# Patient Record
Sex: Female | Born: 1935 | Race: White | Hispanic: No | Smoking: Never smoker
Health system: Southern US, Community
[De-identification: ages and names within clinical notes are randomized; demographics above are authoritative.]

## PROBLEM LIST (undated history)

## (undated) DIAGNOSIS — I499 Cardiac arrhythmia, unspecified: Secondary | ICD-10-CM

## (undated) DIAGNOSIS — C50919 Malignant neoplasm of unspecified site of unspecified female breast: Secondary | ICD-10-CM

## (undated) DIAGNOSIS — H9190 Unspecified hearing loss, unspecified ear: Secondary | ICD-10-CM

## (undated) DIAGNOSIS — I10 Essential (primary) hypertension: Secondary | ICD-10-CM

## (undated) DIAGNOSIS — F419 Anxiety disorder, unspecified: Secondary | ICD-10-CM

## (undated) DIAGNOSIS — I209 Angina pectoris, unspecified: Secondary | ICD-10-CM

## (undated) DIAGNOSIS — I519 Heart disease, unspecified: Secondary | ICD-10-CM

## (undated) DIAGNOSIS — C50219 Malignant neoplasm of upper-inner quadrant of unspecified female breast: Secondary | ICD-10-CM

## (undated) DIAGNOSIS — K219 Gastro-esophageal reflux disease without esophagitis: Secondary | ICD-10-CM

## (undated) DIAGNOSIS — M25559 Pain in unspecified hip: Secondary | ICD-10-CM

## (undated) DIAGNOSIS — IMO0001 Reserved for inherently not codable concepts without codable children: Secondary | ICD-10-CM

## (undated) DIAGNOSIS — I2510 Atherosclerotic heart disease of native coronary artery without angina pectoris: Secondary | ICD-10-CM

## (undated) DIAGNOSIS — I4891 Unspecified atrial fibrillation: Secondary | ICD-10-CM

## (undated) DIAGNOSIS — N63 Unspecified lump in unspecified breast: Secondary | ICD-10-CM

## (undated) DIAGNOSIS — E785 Hyperlipidemia, unspecified: Secondary | ICD-10-CM

## (undated) DIAGNOSIS — Z853 Personal history of malignant neoplasm of breast: Secondary | ICD-10-CM

## (undated) DIAGNOSIS — Z1239 Encounter for other screening for malignant neoplasm of breast: Secondary | ICD-10-CM

## (undated) DIAGNOSIS — Z1211 Encounter for screening for malignant neoplasm of colon: Secondary | ICD-10-CM

## (undated) DIAGNOSIS — I219 Acute myocardial infarction, unspecified: Secondary | ICD-10-CM

## (undated) DIAGNOSIS — T7840XA Allergy, unspecified, initial encounter: Secondary | ICD-10-CM

## (undated) DIAGNOSIS — K5790 Diverticulosis of intestine, part unspecified, without perforation or abscess without bleeding: Secondary | ICD-10-CM

## (undated) DIAGNOSIS — I639 Cerebral infarction, unspecified: Secondary | ICD-10-CM

## (undated) DIAGNOSIS — M1990 Unspecified osteoarthritis, unspecified site: Secondary | ICD-10-CM

## (undated) DIAGNOSIS — C50419 Malignant neoplasm of upper-outer quadrant of unspecified female breast: Secondary | ICD-10-CM

## (undated) DIAGNOSIS — D213 Benign neoplasm of connective and other soft tissue of thorax: Secondary | ICD-10-CM

## (undated) HISTORY — PX: HERNIA REPAIR: SHX51

## (undated) HISTORY — DX: Unspecified atrial fibrillation: I48.91

## (undated) HISTORY — DX: Reserved for inherently not codable concepts without codable children: IMO0001

## (undated) HISTORY — DX: Malignant neoplasm of upper-inner quadrant of unspecified female breast: C50.219

## (undated) HISTORY — PX: TONSILLECTOMY: SUR1361

## (undated) HISTORY — DX: Allergy, unspecified, initial encounter: T78.40XA

## (undated) HISTORY — DX: Benign neoplasm of connective and other soft tissue of thorax: D21.3

## (undated) HISTORY — DX: Acute myocardial infarction, unspecified: I21.9

## (undated) HISTORY — DX: Encounter for screening for malignant neoplasm of colon: Z12.11

## (undated) HISTORY — PX: ABDOMINAL HYSTERECTOMY: SHX81

## (undated) HISTORY — PX: CHOLECYSTECTOMY: SHX55

## (undated) HISTORY — PX: APPENDECTOMY: SHX54

## (undated) HISTORY — PX: BREAST BIOPSY: SHX20

## (undated) HISTORY — DX: Essential (primary) hypertension: I10

## (undated) HISTORY — DX: Unspecified hearing loss, unspecified ear: H91.90

## (undated) HISTORY — DX: Malignant neoplasm of unspecified site of unspecified female breast: C50.919

## (undated) HISTORY — DX: Unspecified lump in unspecified breast: N63.0

## (undated) HISTORY — PX: COLON SURGERY: SHX602

## (undated) HISTORY — DX: Unspecified osteoarthritis, unspecified site: M19.90

## (undated) HISTORY — DX: Malignant neoplasm of upper-outer quadrant of unspecified female breast: C50.419

## (undated) HISTORY — DX: Gastro-esophageal reflux disease without esophagitis: K21.9

## (undated) HISTORY — DX: Heart disease, unspecified: I51.9

## (undated) HISTORY — PX: EYE SURGERY: SHX253

## (undated) HISTORY — DX: Cerebral infarction, unspecified: I63.9

## (undated) HISTORY — PX: JOINT REPLACEMENT: SHX530

## (undated) HISTORY — DX: Personal history of malignant neoplasm of breast: Z85.3

## (undated) HISTORY — DX: Encounter for other screening for malignant neoplasm of breast: Z12.39

---

## 1994-12-22 DIAGNOSIS — I10 Essential (primary) hypertension: Secondary | ICD-10-CM

## 1994-12-22 HISTORY — DX: Essential (primary) hypertension: I10

## 1999-12-23 DIAGNOSIS — I219 Acute myocardial infarction, unspecified: Secondary | ICD-10-CM

## 1999-12-23 DIAGNOSIS — I519 Heart disease, unspecified: Secondary | ICD-10-CM

## 1999-12-23 HISTORY — DX: Heart disease, unspecified: I51.9

## 1999-12-23 HISTORY — DX: Acute myocardial infarction, unspecified: I21.9

## 2005-01-20 ENCOUNTER — Ambulatory Visit: Payer: Self-pay

## 2005-02-02 ENCOUNTER — Emergency Department: Payer: Self-pay | Admitting: Internal Medicine

## 2005-02-12 ENCOUNTER — Ambulatory Visit: Payer: Self-pay | Admitting: Internal Medicine

## 2005-03-31 ENCOUNTER — Ambulatory Visit: Payer: Self-pay

## 2005-06-14 ENCOUNTER — Other Ambulatory Visit: Payer: Self-pay

## 2005-06-14 ENCOUNTER — Emergency Department: Payer: Self-pay | Admitting: Emergency Medicine

## 2005-08-05 ENCOUNTER — Ambulatory Visit: Payer: Self-pay | Admitting: Anesthesiology

## 2005-08-14 ENCOUNTER — Ambulatory Visit: Payer: Self-pay | Admitting: Anesthesiology

## 2005-09-09 ENCOUNTER — Ambulatory Visit: Payer: Self-pay | Admitting: Anesthesiology

## 2005-10-13 ENCOUNTER — Ambulatory Visit: Payer: Self-pay | Admitting: Anesthesiology

## 2006-04-23 ENCOUNTER — Ambulatory Visit: Payer: Self-pay | Admitting: Internal Medicine

## 2006-08-04 ENCOUNTER — Emergency Department: Payer: Self-pay | Admitting: Emergency Medicine

## 2006-08-04 ENCOUNTER — Other Ambulatory Visit: Payer: Self-pay

## 2006-08-20 ENCOUNTER — Ambulatory Visit: Payer: Self-pay | Admitting: Anesthesiology

## 2006-09-08 ENCOUNTER — Ambulatory Visit: Payer: Self-pay | Admitting: Anesthesiology

## 2007-03-15 ENCOUNTER — Ambulatory Visit: Payer: Self-pay | Admitting: Internal Medicine

## 2007-05-13 ENCOUNTER — Ambulatory Visit: Payer: Self-pay | Admitting: Internal Medicine

## 2007-12-23 DIAGNOSIS — I639 Cerebral infarction, unspecified: Secondary | ICD-10-CM

## 2007-12-23 HISTORY — PX: COLONOSCOPY: SHX174

## 2007-12-23 HISTORY — DX: Cerebral infarction, unspecified: I63.9

## 2007-12-23 HISTORY — PX: UPPER GI ENDOSCOPY: SHX6162

## 2008-03-29 ENCOUNTER — Ambulatory Visit: Payer: Self-pay | Admitting: Cardiology

## 2008-05-17 ENCOUNTER — Ambulatory Visit: Payer: Self-pay | Admitting: Family Medicine

## 2008-05-31 ENCOUNTER — Ambulatory Visit: Payer: Self-pay | Admitting: Gastroenterology

## 2008-05-31 ENCOUNTER — Encounter: Payer: Self-pay | Admitting: Gastroenterology

## 2008-06-05 ENCOUNTER — Ambulatory Visit: Payer: Self-pay | Admitting: Gastroenterology

## 2008-10-01 ENCOUNTER — Other Ambulatory Visit: Payer: Self-pay

## 2008-10-01 ENCOUNTER — Inpatient Hospital Stay: Payer: Self-pay | Admitting: Internal Medicine

## 2008-10-30 ENCOUNTER — Emergency Department: Payer: Self-pay | Admitting: Emergency Medicine

## 2008-11-14 ENCOUNTER — Ambulatory Visit: Payer: Self-pay | Admitting: Family Medicine

## 2009-01-13 ENCOUNTER — Emergency Department: Payer: Self-pay | Admitting: Emergency Medicine

## 2009-01-14 ENCOUNTER — Emergency Department: Payer: Self-pay | Admitting: Emergency Medicine

## 2009-02-05 ENCOUNTER — Ambulatory Visit: Payer: Self-pay | Admitting: Gastroenterology

## 2009-02-14 ENCOUNTER — Ambulatory Visit: Payer: Self-pay | Admitting: Gastroenterology

## 2009-02-14 DIAGNOSIS — R1013 Epigastric pain: Secondary | ICD-10-CM

## 2009-02-14 DIAGNOSIS — R932 Abnormal findings on diagnostic imaging of liver and biliary tract: Secondary | ICD-10-CM | POA: Insufficient documentation

## 2009-02-14 LAB — CONVERTED CEMR LAB
Alkaline Phosphatase: 56 units/L (ref 39–117)
Basophils Absolute: 0 10*3/uL (ref 0.0–0.1)
Basophils Relative: 0.2 % (ref 0.0–3.0)
CO2: 30 meq/L (ref 19–32)
Calcium: 9.3 mg/dL (ref 8.4–10.5)
Chloride: 104 meq/L (ref 96–112)
Eosinophils Absolute: 0.1 10*3/uL (ref 0.0–0.7)
GFR calc non Af Amer: 74 mL/min
Glucose, Bld: 108 mg/dL — ABNORMAL HIGH (ref 70–99)
HCT: 39.9 % (ref 36.0–46.0)
Monocytes Absolute: 0.7 10*3/uL (ref 0.1–1.0)
Monocytes Relative: 8.7 % (ref 3.0–12.0)
Neutro Abs: 4.3 10*3/uL (ref 1.4–7.7)
Neutrophils Relative %: 49.5 % (ref 43.0–77.0)
Platelets: 271 10*3/uL (ref 150–400)
Potassium: 4.5 meq/L (ref 3.5–5.1)
RBC: 4.07 M/uL (ref 3.87–5.11)

## 2009-02-15 ENCOUNTER — Ambulatory Visit: Payer: Self-pay | Admitting: Cardiology

## 2009-03-19 ENCOUNTER — Observation Stay: Payer: Self-pay | Admitting: Internal Medicine

## 2009-08-10 ENCOUNTER — Ambulatory Visit: Payer: Self-pay | Admitting: Family Medicine

## 2009-10-16 ENCOUNTER — Encounter: Admission: RE | Admit: 2009-10-16 | Discharge: 2009-10-16 | Payer: Self-pay | Admitting: Neurology

## 2009-11-20 ENCOUNTER — Ambulatory Visit: Payer: Self-pay | Admitting: Family Medicine

## 2009-11-28 ENCOUNTER — Inpatient Hospital Stay: Payer: Self-pay | Admitting: Internal Medicine

## 2010-03-14 ENCOUNTER — Emergency Department: Payer: Self-pay | Admitting: Emergency Medicine

## 2010-07-18 ENCOUNTER — Emergency Department: Payer: Self-pay | Admitting: Emergency Medicine

## 2010-08-10 ENCOUNTER — Inpatient Hospital Stay: Payer: Self-pay | Admitting: Specialist

## 2010-08-23 ENCOUNTER — Ambulatory Visit: Payer: Self-pay | Admitting: Gastroenterology

## 2010-08-27 LAB — PATHOLOGY REPORT

## 2010-08-29 ENCOUNTER — Ambulatory Visit: Payer: Self-pay | Admitting: Gastroenterology

## 2010-12-22 DIAGNOSIS — H9190 Unspecified hearing loss, unspecified ear: Secondary | ICD-10-CM

## 2010-12-22 DIAGNOSIS — C50919 Malignant neoplasm of unspecified site of unspecified female breast: Secondary | ICD-10-CM

## 2010-12-22 HISTORY — PX: MASTECTOMY: SHX3

## 2010-12-22 HISTORY — DX: Unspecified hearing loss, unspecified ear: H91.90

## 2010-12-22 HISTORY — DX: Malignant neoplasm of unspecified site of unspecified female breast: C50.919

## 2011-01-31 ENCOUNTER — Ambulatory Visit: Payer: Self-pay | Admitting: Family Medicine

## 2011-02-10 ENCOUNTER — Ambulatory Visit: Payer: Self-pay | Admitting: Family Medicine

## 2011-03-04 ENCOUNTER — Ambulatory Visit: Payer: Self-pay | Admitting: Family Medicine

## 2011-03-09 DIAGNOSIS — D0512 Intraductal carcinoma in situ of left breast: Secondary | ICD-10-CM | POA: Insufficient documentation

## 2011-03-20 ENCOUNTER — Ambulatory Visit: Payer: Self-pay | Admitting: General Surgery

## 2011-03-31 ENCOUNTER — Ambulatory Visit: Payer: Self-pay | Admitting: General Surgery

## 2011-04-08 LAB — PATHOLOGY REPORT

## 2011-06-01 ENCOUNTER — Inpatient Hospital Stay: Payer: Self-pay | Admitting: Internal Medicine

## 2011-11-19 ENCOUNTER — Ambulatory Visit: Payer: Self-pay | Admitting: Family Medicine

## 2011-12-29 ENCOUNTER — Encounter: Payer: Self-pay | Admitting: Urology

## 2012-02-10 ENCOUNTER — Observation Stay: Payer: Self-pay | Admitting: Internal Medicine

## 2012-02-10 LAB — COMPREHENSIVE METABOLIC PANEL
Albumin: 3.7 g/dL (ref 3.4–5.0)
Alkaline Phosphatase: 51 U/L (ref 50–136)
Anion Gap: 8 (ref 7–16)
Calcium, Total: 9.3 mg/dL (ref 8.5–10.1)
Chloride: 102 mmol/L (ref 98–107)
EGFR (African American): >60
Glucose: 79 mg/dL (ref 65–99)
Potassium: 3.9 mmol/L (ref 3.5–5.1)
SGOT(AST): 26 U/L (ref 15–37)

## 2012-02-10 LAB — APTT: Activated PTT: 28.9 secs (ref 23.6–35.9)

## 2012-02-10 LAB — LIPASE, BLOOD: Lipase: 150 U/L (ref 73–393)

## 2012-02-10 LAB — CBC
HCT: 41.3 % (ref 35.0–47.0)
MCH: 32.1 pg (ref 26.0–34.0)
MCHC: 32.5 g/dL (ref 32.0–36.0)
MCV: 99 fL (ref 80–100)
Platelet: 264 10*3/uL (ref 150–440)
RDW: 11.7 % (ref 11.5–14.5)

## 2012-02-10 LAB — PROTIME-INR
INR: 0.9
Prothrombin Time: 12.2 secs (ref 11.5–14.7)

## 2012-02-10 LAB — MAGNESIUM: Magnesium: 2.1 mg/dL

## 2012-02-10 LAB — TROPONIN I: Troponin-I: <0.02 ng/mL

## 2012-02-10 LAB — CK TOTAL AND CKMB (NOT AT ARMC): CK-MB: <0.5 ng/mL — ABNORMAL LOW (ref 0.5–3.6)

## 2012-02-11 LAB — CBC WITH DIFFERENTIAL/PLATELET
Basophil %: 0.9 %
Eosinophil %: 3 %
HCT: 37.6 % (ref 35.0–47.0)
HGB: 12.9 g/dL (ref 12.0–16.0)
Lymphocyte #: 3.6 10*3/uL (ref 1.0–3.6)
MCHC: 34.2 g/dL (ref 32.0–36.0)
MCV: 99 fL (ref 80–100)
Monocyte #: 0.9 10*3/uL — ABNORMAL HIGH (ref 0.0–0.7)
Monocyte %: 10.9 %
Neutrophil #: 3.2 10*3/uL (ref 1.4–6.5)
Platelet: 237 10*3/uL (ref 150–440)
RBC: 3.79 10*6/uL — ABNORMAL LOW (ref 3.80–5.20)
WBC: 8 10*3/uL (ref 3.6–11.0)

## 2012-02-11 LAB — BASIC METABOLIC PANEL
Calcium, Total: 8.9 mg/dL (ref 8.5–10.1)
Co2: 26 mmol/L (ref 21–32)
Creatinine: 0.96 mg/dL (ref 0.60–1.30)
EGFR (African American): >60
EGFR (Non-African Amer.): 59 — ABNORMAL LOW
Glucose: 95 mg/dL (ref 65–99)
Osmolality: 283 (ref 275–301)
Potassium: 4 mmol/L (ref 3.5–5.1)
Sodium: 142 mmol/L (ref 136–145)

## 2012-02-11 LAB — CK TOTAL AND CKMB (NOT AT ARMC)
CK, Total: 48 U/L (ref 21–215)
CK, Total: 55 U/L (ref 21–215)
CK-MB: <0.5 ng/mL — ABNORMAL LOW (ref 0.5–3.6)
CK-MB: <0.5 ng/mL — ABNORMAL LOW (ref 0.5–3.6)

## 2012-02-11 LAB — TROPONIN I: Troponin-I: <0.02 ng/mL

## 2012-02-24 ENCOUNTER — Ambulatory Visit: Payer: Self-pay | Admitting: General Surgery

## 2012-03-01 ENCOUNTER — Ambulatory Visit: Payer: Self-pay | Admitting: Gastroenterology

## 2012-06-18 ENCOUNTER — Inpatient Hospital Stay: Payer: Self-pay | Admitting: Surgery

## 2012-06-18 LAB — CBC WITH DIFFERENTIAL/PLATELET
Basophil %: 0.7 %
Eosinophil %: 2.7 %
HGB: 12.8 g/dL (ref 12.0–16.0)
Lymphocyte #: 3.1 10*3/uL (ref 1.0–3.6)
Lymphocyte %: 35.8 %
MCHC: 32.7 g/dL (ref 32.0–36.0)
Monocyte #: 0.8 x10 3/mm (ref 0.2–0.9)
Monocyte %: 8.9 %
Neutrophil #: 4.5 10*3/uL (ref 1.4–6.5)
RDW: 12.4 % (ref 11.5–14.5)
WBC: 8.6 10*3/uL (ref 3.6–11.0)

## 2012-06-18 LAB — COMPREHENSIVE METABOLIC PANEL
BUN: 11 mg/dL (ref 7–18)
Co2: 23 mmol/L (ref 21–32)
EGFR (African American): >60
Osmolality: 285 (ref 275–301)

## 2012-06-22 LAB — PATHOLOGY REPORT

## 2012-10-01 ENCOUNTER — Inpatient Hospital Stay: Payer: Self-pay | Admitting: Internal Medicine

## 2012-10-01 LAB — CBC
MCHC: 34.5 g/dL (ref 32.0–36.0)
Platelet: 258 10*3/uL (ref 150–440)
RBC: 3.95 10*6/uL (ref 3.80–5.20)
RDW: 12.8 % (ref 11.5–14.5)
WBC: 8.7 10*3/uL (ref 3.6–11.0)

## 2012-10-01 LAB — COMPREHENSIVE METABOLIC PANEL
Alkaline Phosphatase: 98 U/L (ref 50–136)
BUN: 13 mg/dL (ref 7–18)
Bilirubin,Total: 1 mg/dL (ref 0.2–1.0)
Chloride: 109 mmol/L — ABNORMAL HIGH (ref 98–107)
Co2: 23 mmol/L (ref 21–32)
Creatinine: 0.58 mg/dL — ABNORMAL LOW (ref 0.60–1.30)
EGFR (African American): >60
Osmolality: 283 (ref 275–301)
Potassium: 4.3 mmol/L (ref 3.5–5.1)
SGPT (ALT): 48 U/L (ref 12–78)
Sodium: 142 mmol/L (ref 136–145)
Total Protein: 7.3 g/dL (ref 6.4–8.2)

## 2012-10-01 LAB — APTT: Activated PTT: 28.5 secs (ref 23.6–35.9)

## 2012-10-01 LAB — TROPONIN I: Troponin-I: 0.03 ng/mL

## 2012-10-01 LAB — TSH: Thyroid Stimulating Horm: 0.989 u[IU]/mL

## 2012-10-02 LAB — CBC WITH DIFFERENTIAL/PLATELET
Basophil %: 1.1 %
Eosinophil #: 0.3 10*3/uL (ref 0.0–0.7)
Eosinophil %: 3.3 %
HGB: 12.6 g/dL (ref 12.0–16.0)
Lymphocyte %: 53.7 %
MCH: 33.1 pg (ref 26.0–34.0)
MCHC: 34.4 g/dL (ref 32.0–36.0)
MCV: 97 fL (ref 80–100)
Monocyte #: 0.7 x10 3/mm (ref 0.2–0.9)
Monocyte %: 9.7 %
Neutrophil %: 32.2 %
Platelet: 256 10*3/uL (ref 150–440)
RBC: 3.8 10*6/uL (ref 3.80–5.20)

## 2012-10-02 LAB — BASIC METABOLIC PANEL
Anion Gap: 11 (ref 7–16)
BUN: 13 mg/dL (ref 7–18)
Creatinine: 0.83 mg/dL (ref 0.60–1.30)
EGFR (African American): >60
Glucose: 101 mg/dL — ABNORMAL HIGH (ref 65–99)
Potassium: 3.6 mmol/L (ref 3.5–5.1)
Sodium: 143 mmol/L (ref 136–145)

## 2012-10-02 LAB — CK TOTAL AND CKMB (NOT AT ARMC)
CK, Total: 60 U/L (ref 21–215)
CK-MB: 1 ng/mL (ref 0.5–3.6)

## 2012-10-02 LAB — LIPID PANEL
Ldl Cholesterol, Calc: 105 mg/dL — ABNORMAL HIGH (ref 0–100)
Triglycerides: 105 mg/dL (ref 0–200)

## 2012-10-03 LAB — APTT: Activated PTT: 120.1 secs — ABNORMAL HIGH (ref 23.6–35.9)

## 2012-10-03 LAB — PROTIME-INR: Prothrombin Time: 12.7 secs (ref 11.5–14.7)

## 2012-12-04 ENCOUNTER — Encounter: Payer: Self-pay | Admitting: *Deleted

## 2012-12-29 ENCOUNTER — Inpatient Hospital Stay: Payer: Self-pay | Admitting: Surgery

## 2012-12-29 LAB — COMPREHENSIVE METABOLIC PANEL
Albumin: 3.9 g/dL (ref 3.4–5.0)
Alkaline Phosphatase: 99 U/L (ref 50–136)
Bilirubin,Total: 0.5 mg/dL (ref 0.2–1.0)
Co2: 27 mmol/L (ref 21–32)
Creatinine: 0.95 mg/dL (ref 0.60–1.30)
EGFR (Non-African Amer.): 56 — ABNORMAL LOW
Glucose: 126 mg/dL — ABNORMAL HIGH (ref 65–99)
Osmolality: 291 (ref 275–301)
SGOT(AST): 22 U/L (ref 15–37)
Sodium: 144 mmol/L (ref 136–145)

## 2012-12-29 LAB — PROTIME-INR: Prothrombin Time: 17.5 secs — ABNORMAL HIGH (ref 11.5–14.7)

## 2012-12-29 LAB — CBC
MCH: 31.2 pg (ref 26.0–34.0)
MCHC: 32.2 g/dL (ref 32.0–36.0)
MCV: 97 fL (ref 80–100)
Platelet: 260 10*3/uL (ref 150–440)
RBC: 4.1 10*6/uL (ref 3.80–5.20)
RDW: 12.5 % (ref 11.5–14.5)

## 2012-12-30 LAB — CBC WITH DIFFERENTIAL/PLATELET
Basophil #: 0.1 10*3/uL (ref 0.0–0.1)
Basophil %: 0.5 %
Eosinophil #: 0 10*3/uL (ref 0.0–0.7)
Eosinophil %: 0.1 %
HCT: 43.3 % (ref 35.0–47.0)
Lymphocyte #: 2.1 10*3/uL (ref 1.0–3.6)
Lymphocyte %: 13.1 %
MCHC: 32.8 g/dL (ref 32.0–36.0)
MCV: 97 fL (ref 80–100)
Monocyte #: 1.4 x10 3/mm — ABNORMAL HIGH (ref 0.2–0.9)
Platelet: 251 10*3/uL (ref 150–440)
RBC: 4.45 10*6/uL (ref 3.80–5.20)
RDW: 12.7 % (ref 11.5–14.5)

## 2012-12-30 LAB — PROTIME-INR
INR: 1.8
Prothrombin Time: 21.5 secs — ABNORMAL HIGH (ref 11.5–14.7)

## 2012-12-30 LAB — BASIC METABOLIC PANEL
BUN: 15 mg/dL (ref 7–18)
Calcium, Total: 8.5 mg/dL (ref 8.5–10.1)
Chloride: 108 mmol/L — ABNORMAL HIGH (ref 98–107)
Co2: 25 mmol/L (ref 21–32)
EGFR (African American): >60
EGFR (Non-African Amer.): >60

## 2012-12-31 HISTORY — PX: SMALL INTESTINE SURGERY: SHX150

## 2012-12-31 LAB — PROTIME-INR
INR: 1.2
Prothrombin Time: 15.3 s — ABNORMAL HIGH

## 2013-01-02 LAB — CBC WITH DIFFERENTIAL/PLATELET
Basophil %: 0.6 %
HGB: 12.6 g/dL (ref 12.0–16.0)
Lymphocyte %: 22.9 %
MCH: 32.2 pg (ref 26.0–34.0)
MCV: 98 fL (ref 80–100)
Monocyte #: 1.4 x10 3/mm — ABNORMAL HIGH (ref 0.2–0.9)
Monocyte %: 10.3 %
RBC: 3.92 10*6/uL (ref 3.80–5.20)

## 2013-01-02 LAB — BASIC METABOLIC PANEL
Anion Gap: 7 (ref 7–16)
Calcium, Total: 8.3 mg/dL — ABNORMAL LOW (ref 8.5–10.1)
EGFR (African American): >60
Glucose: 111 mg/dL — ABNORMAL HIGH (ref 65–99)
Osmolality: 275 (ref 275–301)
Potassium: 4.3 mmol/L (ref 3.5–5.1)
Sodium: 138 mmol/L (ref 136–145)

## 2013-01-06 LAB — PROTIME-INR
INR: 0.9
Prothrombin Time: 12.7 secs (ref 11.5–14.7)

## 2013-01-06 LAB — PLATELET COUNT: Platelet: 329 10*3/uL (ref 150–440)

## 2013-01-28 ENCOUNTER — Encounter: Payer: Self-pay | Admitting: General Surgery

## 2013-02-24 ENCOUNTER — Ambulatory Visit: Payer: Self-pay | Admitting: General Surgery

## 2013-09-05 ENCOUNTER — Ambulatory Visit (INDEPENDENT_AMBULATORY_CARE_PROVIDER_SITE_OTHER): Payer: Medicare Other | Admitting: Neurology

## 2013-09-05 ENCOUNTER — Encounter: Payer: Self-pay | Admitting: Neurology

## 2013-09-05 DIAGNOSIS — H538 Other visual disturbances: Secondary | ICD-10-CM | POA: Insufficient documentation

## 2013-09-05 DIAGNOSIS — I6529 Occlusion and stenosis of unspecified carotid artery: Secondary | ICD-10-CM

## 2013-09-05 NOTE — Patient Instructions (Addendum)
Continue warfarin for stroke prevention given atrial fibrillation and strict control of hypertension with blood pressure goal below 130/90 and lipids with LDL cholesterol goal below 100 mg percent. Check followup carotid ultrasound study. Return for followup in 6 months with Darrol Angel, NO or. Call  earlier if necessary.

## 2013-09-05 NOTE — Progress Notes (Signed)
[facility] [address] [place]. [place] [address] [phone]       OFFICE FOLLOW-UP NOTE  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 Vascular risk factors of age, sex, hyperlipidemia, hypertension and atrial fibrillatio Update 09/05/2013  She returns for followup after her last visit on 03/02/13 with Darrol Angel, NP. She states she is doing well without recurrent stroke or TIA symptoms. She's had no further episodes of blurred vision either. She plans to see her ophthalmologist next month for routine checkup. She is tolerating warfarin well without significant bleeding, bruising or other side effects. She lives alone and is fully independent in activities of daily living. She has no complaints today. ROS:   14 system review of systems is positive for restless legs, feeling hot and cold, joint pain, allergies, blurred vision, leg swelling and hearing loss.  PMH:  Past Medical History  Diagnosis Date  . Essential hypertension, benign   . Arthritis   . Heart disease 2001  . Hypertension 1996  . Stroke 2009  . Heart attack 2001  . Unspecified essential hypertension   . Hearing disorder 2012    hearing aids  . Reflux     acid reflux  . Breast screening, unspecified   . Lump or mass in breast   . Special screening for malignant neoplasms, colon   . Other benign neoplasm of connective and other soft tissue of thorax   . Personal history of malignant neoplasm of breast   . Breast cancer 2012    Patient underwent a left mastectomy on 03-31-11. Pathology showed DCIS at both sites without an invasive component. Sentinel nodes were negative. The breast lesions were noted to be ER +40%, PR-positive, 5%.   . Malignant neoplasm of upper-inner quadrant of female breast   .  Malignant neoplasm of upper-outer quadrant of female breast     Social History:  History   Social History  . Marital Status: Widowed    Spouse Name: N/A    Number of Children: N/A  . Years of Education: N/A   Occupational History  . Not on file.   Social History Main Topics  . Smoking status: Never Smoker   . Smokeless tobacco: Not on file  . Alcohol Use: No  . Drug Use: No  . Sexual Activity: Not on file   Other Topics Concern  . Not on file   Social History Narrative  . No narrative on file    Medications:   Current Outpatient Prescriptions on File Prior to Visit  Medication Sig Dispense Refill  . acetaminophen (TYLENOL) 325 MG tablet Take 325 mg by mouth as needed.      . ALPRAZolam (XANAX) 0.25 MG tablet Take 0.25 mg by mouth daily.      Marland Kitchen aspirin 81 MG tablet Take 81 mg by mouth daily.      . ranitidine (ZANTAC) 150 MG tablet Take 150 mg by mouth as directed.       No current facility-administered medications on file prior to visit.    Allergies:   Allergies  Allergen Reactions  . Alendronate Sodium     REACTION: questionable  . Amoxicillin-Pot Clavulanate   . Etodolac   . Ibandronate Sodium     REACTION: questionable  . Lactose Intolerance (Gi) Other (See  Comments)    GI problems  . Minocycline Hcl   . Risedronate Sodium   . Sulfonamide Derivatives Other (See Comments)    Stomach pain  . Zoledronic Acid     REACTION: "dental problems"    Physical Exam General: well developed, well nourished, seated, in no evident distress Head: head normocephalic and atraumatic. Orohparynx benign Neck: supple with no carotid or supraclavicular bruits Cardiovascular: regular rate and rhythm, no murmurs Musculoskeletal: arhritic distal finger flexion deformity left hand Skin:  no rash/petichiae Vascular:  Normal pulses all extremities Filed Vitals:   09/05/13 1420  BP: 137/73  Pulse: 70  Temp: 98.5 F (36.9 C)    Neurologic Exam Mental Status: Awake and  fully alert. Oriented to place and time. Recent and remote memory mildy diminished. Attention span, concentration and fund of knowledge appropriate. Mood and affect appropriate.  Cranial Nerves: Fundoscopic exam not done   Pupils equal, briskly reactive to light. Extraocular movements full without nystagmus. Visual fields full to confrontation. Hearing mildly diminished bilaterally. Facial sensation intact. Face, tongue, palate moves normally and symmetrically.  Motor: Normal bulk and tone. Normal strength in all tested extremity muscles. Mild resting lower lip tremor. Sensory.: intact to tough and pinprick and vibratory.  Coordination: Rapid alternating movements normal in all extremities. Finger-to-nose and heel-to-shin performed accurately bilaterally. Gait and Station: Arises from chair without difficulty. Stance is normal. Gait demonstrates normal stride length and balance . Able to heel, toe and tandem walk without difficulty.  Reflexes: 1+ and symmetric. Toes downgoing.     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 Vascular risk factors of age, sex, hyperlipidemia, hypertension and atrial fibrillation.    PLAN:  Continue warfarin for stroke prevention given atrial fibrillation and strict control of hypertension with blood pressure goal below 130/90 and lipids with LDL cholesterol goal below 100 mg percent. Check followup carotid ultrasound study. Return for followup in 6 months with Darrol Angel, NO or. Call  earlier if necessary.

## 2013-09-15 ENCOUNTER — Other Ambulatory Visit: Payer: Medicare Other

## 2013-09-20 ENCOUNTER — Ambulatory Visit (INDEPENDENT_AMBULATORY_CARE_PROVIDER_SITE_OTHER): Payer: Medicare Other

## 2013-09-20 DIAGNOSIS — I6529 Occlusion and stenosis of unspecified carotid artery: Secondary | ICD-10-CM

## 2013-10-10 ENCOUNTER — Telehealth: Payer: Self-pay | Admitting: Neurology

## 2013-10-10 NOTE — Telephone Encounter (Signed)
I called and relayed the results of doppler to [name], her daughter.  (negative for significant narrowing of bilateral carotids.).

## 2013-11-25 ENCOUNTER — Telehealth: Payer: Self-pay | Admitting: Neurology

## 2013-11-25 NOTE — Telephone Encounter (Signed)
Left message for patient to call and reschedule 3/19 appointment per Carolyn's schedule.

## 2014-02-27 ENCOUNTER — Ambulatory Visit: Payer: Self-pay | Admitting: General Surgery

## 2014-02-27 ENCOUNTER — Encounter: Payer: Self-pay | Admitting: General Surgery

## 2014-03-06 ENCOUNTER — Encounter: Payer: Self-pay | Admitting: General Surgery

## 2014-03-06 ENCOUNTER — Ambulatory Visit: Payer: Self-pay | Admitting: General Surgery

## 2014-03-07 ENCOUNTER — Encounter: Payer: Self-pay | Admitting: General Surgery

## 2014-03-07 ENCOUNTER — Ambulatory Visit (INDEPENDENT_AMBULATORY_CARE_PROVIDER_SITE_OTHER): Payer: Medicare Other | Admitting: General Surgery

## 2014-03-07 VITALS — BP 152/80 | HR 78 | Resp 12 | Ht <= 58 in | Wt 135.0 lb

## 2014-03-07 DIAGNOSIS — Z853 Personal history of malignant neoplasm of breast: Secondary | ICD-10-CM

## 2014-03-07 DIAGNOSIS — D0590 Unspecified type of carcinoma in situ of unspecified breast: Secondary | ICD-10-CM

## 2014-03-07 DIAGNOSIS — D0512 Intraductal carcinoma in situ of left breast: Secondary | ICD-10-CM

## 2014-03-07 NOTE — Progress Notes (Signed)
Patient ID: Peggy [name], female   DOB: 17-May-1928, 80 y.o.   MRN: [id number]  Chief Complaint  Patient presents with  . Follow-up    breast cancer    HPI Peggy [name] is a 80 y.o. female.  who presents for her annual breast follow up breast evaluation. The most recent mammogram was done on 02-27-14 with additional views. She has a known history of breast cancer 2012. No new breast issues.  Patient does perform regular self breast checks and gets regular mammograms done. Followed by Dr. Ubaldo Glassing and Dr. Verneda Skill. The patient is accompanied today by her daughter.  HPI  Past Medical History  Diagnosis Date  . Essential hypertension, benign   . Arthritis   . Heart disease 2001  . Hypertension 1996  . Stroke 2009  . Heart attack 2001  . Unspecified essential hypertension   . Hearing disorder 2012    hearing aids  . Reflux     acid reflux  . Breast screening, unspecified   . Lump or mass in breast   . Special screening for malignant neoplasms, colon   . Other benign neoplasm of connective and other soft tissue of thorax   . Personal history of malignant neoplasm of breast   . Breast cancer 2012    Patient underwent a left mastectomy on 03-31-11. Pathology showed DCIS at both sites without an invasive component. Sentinel nodes were negative. The breast lesions were noted to be ER +40%, PR-positive, 5%.   . Malignant neoplasm of upper-inner quadrant of female breast   . Malignant neoplasm of upper-outer quadrant of female breast   . Atrial fibrillation   . Allergy     Past Surgical History  Procedure Laterality Date  . Mastectomy  2012    left breast  . Breast biopsy      left breast biopsy over 15 years ago   . Upper gi endoscopy  2009    [facility] Dr. Donnella Sham  . Colonoscopy  2009    [facility] Dr. Donnella Sham  . Appendectomy      age 81  . Tonsillectomy      as a child  . Cholecystectomy      40 years ago   . Abdominal hysterectomy      age 38    Family History  Problem Relation  Age of Onset  . Colon cancer Neg Hx   . Ovarian cancer Neg Hx   . Breast cancer Sister     x 2, in their 64's  . Lung cancer Brother   . Stroke Father   . Heart disease Father   . Heart disease Mother     Social History History  Substance Use Topics  . Smoking status: Never Smoker   . Smokeless tobacco: Not on file  . Alcohol Use: No    Allergies  Allergen Reactions  . Alendronate Sodium     REACTION: questionable  . Amoxicillin-Pot Clavulanate   . Etodolac   . Ibandronate Sodium     REACTION: questionable  . Lactose Intolerance (Gi) Other (See Comments)    GI problems  . Minocycline Hcl   . Risedronate Sodium   . Sulfonamide Derivatives Other (See Comments)    Stomach pain  . Zoledronic Acid     REACTION: "dental problems"    Current Outpatient Prescriptions  Medication Sig Dispense Refill  . acetaminophen (TYLENOL) 325 MG tablet Take 325 mg by mouth as needed.      . ALPRAZolam (XANAX) 0.25 MG tablet  Take 0.25 mg by mouth at bedtime.       Marland Kitchen aspirin 81 MG tablet Take 81 mg by mouth daily.      Marland Kitchen escitalopram (LEXAPRO) 10 MG tablet Take 10 mg by mouth daily. 1/4 in the am      . Lansoprazole (PREVACID PO) Take by mouth daily.      Marland Kitchen warfarin (COUMADIN) 3 MG tablet Take 3.5 mg by mouth daily at 6 PM.        No current facility-administered medications for this visit.    Review of Systems Review of Systems  Constitutional: Negative.   Respiratory: Negative.   Cardiovascular: Negative.     Blood pressure 152/80, pulse 78, resp. rate 12, height 4\' 8"  (1.422 m), weight 135 lb (61.236 kg).  Physical Exam Physical Exam  Constitutional: She is oriented to person, place, and time. She appears well-developed and well-nourished.  Neck: Neck supple.  Cardiovascular: Normal rate and regular rhythm.   Murmur heard.  Systolic murmur is present with a grade of 1/6  Pulmonary/Chest: Effort normal and breath sounds normal. Right breast exhibits no inverted nipple, no  mass, no nipple discharge, no skin change and no tenderness.  Left mastectomy site is well healed. Minimal thickening near the pectoralis muscle laterally. Excellent shoulder range of motion. No evidence of lymphedema.  Lymphadenopathy:    She has no cervical adenopathy.    She has no axillary adenopathy.  Neurological: She is alert and oriented to person, place, and time.  Skin: Skin is warm and dry.    Data Reviewed Right breast mammogram on February 27, 2014 and focal spot compression views of March 06, 2014 were reviewed. No persistent distortion. BI-RAD-1.  Assessment    Doing well status post left mastectomy for DCIS.     Plan    Return in one year with right diagnostic mammogram to be completed at [facility] (with patient approval).  A prescription for a left breast prosthesis and surgical bras was provided.       Robert Bellow 03/08/2014, 6:29 AM

## 2014-03-07 NOTE — Patient Instructions (Signed)
Return in one year with right diagnostic mammogram and office visit.

## 2014-03-09 ENCOUNTER — Ambulatory Visit: Payer: Medicare Other | Admitting: Nurse Practitioner

## 2014-04-10 ENCOUNTER — Ambulatory Visit (INDEPENDENT_AMBULATORY_CARE_PROVIDER_SITE_OTHER): Payer: Medicare Other | Admitting: Nurse Practitioner

## 2014-04-10 ENCOUNTER — Encounter (INDEPENDENT_AMBULATORY_CARE_PROVIDER_SITE_OTHER): Payer: Self-pay

## 2014-04-10 ENCOUNTER — Encounter: Payer: Self-pay | Admitting: Nurse Practitioner

## 2014-04-10 VITALS — BP 136/78 | HR 89 | Ht <= 58 in | Wt 136.0 lb

## 2014-04-10 DIAGNOSIS — H538 Other visual disturbances: Secondary | ICD-10-CM

## 2014-04-10 DIAGNOSIS — I6529 Occlusion and stenosis of unspecified carotid artery: Secondary | ICD-10-CM

## 2014-04-10 DIAGNOSIS — I6389 Other cerebral infarction: Secondary | ICD-10-CM | POA: Insufficient documentation

## 2014-04-10 NOTE — Progress Notes (Signed)
[facility]  PATIENT: Peggy [name] DOB: 1928-04-02   REASON FOR VISIT: Followup for stroke   HISTORY OF PRESENT ILLNESS: Peggy [name], 80 year old female returns for followup. She was last seen by Dr. Leonie Man 09/05/2013. Carotid Doppler after her visit was negative for stenosis. She returns today denying stroke or TIA symptoms. She has not had further episodes of blurred vision. She has been having some hip and knee pain and she is due to see an orthopedist tomorrow. She has minimal bruising from her warfarin. She continues to live alone and is fully independent in all activities of daily living. She continues to walk for exercise. She has no neurologic complaints.  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 Vascular risk factors of age, sex, hyperlipidemia, hypertension and atrial fibrillatio  Update 09/05/2013 She returns for followup after her last visit on 03/02/13 with Peggy Rubin, NP. She states she is doing well without recurrent stroke or TIA symptoms. She's had no further episodes of blurred vision either. She plans to see her ophthalmologist next month for routine checkup. She is tolerating warfarin well without significant bleeding, bruising or other side effects. She lives alone and is fully independent in activities of daily living. She has no complaints today.  REVIEW OF SYSTEMS: Full 14 system review of systems performed and notable only for those listed, all others are neg:  Constitutional: N/A  Cardiovascular: N/A  Ear/Nose/Throat: Hearing loss he was like little warning and then later on said that there was poor beta walk a block he gets in hisu: He is on is the always comes from this one there is an alert he has little or no this VI therapy at health I. how many calorics are in RR 28 he really works staring at a Skin: N/A  Eyes: N/A  Respiratory: N/A  Gastroitestinal:   Urinary frequency Hematology/Lymphatic: Easy bruising  Endocrine: N/A Musculoskeletal: Hip pain, knee pain, shoulder pain Allergy/Immunology: N/A  Neurological: N/A Psychiatric: N/A   ALLERGIES: Allergies  Allergen Reactions  . Alendronate Sodium     REACTION: questionable  . Amoxicillin-Pot Clavulanate   . Etodolac   . Ibandronate Sodium     REACTION: questionable  . Lactose Intolerance (Gi) Other (See Comments)    GI problems  . Minocycline Hcl   . Risedronate Sodium   . Sulfonamide Derivatives Other (See Comments)    Stomach pain  . Zoledronic Acid     REACTION: "dental problems"    HOME MEDICATIONS: Outpatient Prescriptions Prior to Visit  Medication Sig Dispense Refill  . acetaminophen (TYLENOL) 325 MG tablet Take 325 mg by mouth as needed.      . ALPRAZolam (XANAX) 0.25 MG tablet Take 0.25 mg by mouth at bedtime.       Marland Kitchen aspirin 81 MG tablet Take 81 mg by mouth daily.      Marland Kitchen escitalopram (LEXAPRO) 10 MG tablet Take 10 mg by mouth daily. 1/4 in the am      . Lansoprazole (PREVACID PO) Take by mouth daily.      Marland Kitchen warfarin (COUMADIN) 3 MG tablet Take 3.5 mg by mouth daily at 6 PM.        No facility-administered medications prior to visit.    PAST MEDICAL HISTORY: Past Medical History  Diagnosis Date  . Essential hypertension, benign   . Arthritis   . Heart disease 2001  . Hypertension 1996  . Stroke 2009  .  Heart attack 2001  . Unspecified essential hypertension   . Hearing disorder 2012    hearing aids  . Reflux     acid reflux  . Breast screening, unspecified   . Lump or mass in breast   . Special screening for malignant neoplasms, colon   . Other benign neoplasm of connective and other soft tissue of thorax   . Personal history of malignant neoplasm of breast   . Breast cancer 2012    Patient underwent a left mastectomy on 03-31-11. Pathology showed DCIS at both sites without an invasive component. Sentinel nodes were negative. The breast lesions were  noted to be ER +40%, PR-positive, 5%.   . Malignant neoplasm of upper-inner quadrant of female breast   . Malignant neoplasm of upper-outer quadrant of female breast   . Atrial fibrillation   . Allergy     PAST SURGICAL HISTORY: Past Surgical History  Procedure Laterality Date  . Mastectomy  2012    left breast  . Breast biopsy      left breast biopsy over 15 years ago   . Upper gi endoscopy  2009    [facility] Dr. Donnella Sham  . Colonoscopy  2009    [facility] Dr. Donnella Sham  . Appendectomy      age 61  . Tonsillectomy      as a child  . Cholecystectomy      40 years ago   . Abdominal hysterectomy      age 33    FAMILY HISTORY: Family History  Problem Relation Age of Onset  . Colon cancer Neg Hx   . Ovarian cancer Neg Hx   . Breast cancer Sister     x 2, in their 32's  . Lung cancer Brother   . Stroke Father   . Heart disease Father   . Heart disease Mother     SOCIAL HISTORY: History   Social History  . Marital Status: Widowed    Spouse Name: N/A    Number of Children: 2  . Years of Education: 8   Occupational History  . Retired    Social History Main Topics  . Smoking status: Never Smoker   . Smokeless tobacco: Never Used  . Alcohol Use: No  . Drug Use: No  . Sexual Activity: Not on file   Other Topics Concern  . Not on file   Social History Narrative   Patient lives at home alone.    Patient has 2 children.    Patient has a 8th education.    Patient is widowed.    Patient is right handed.    Patient is retired.      PHYSICAL EXAM  Filed Vitals:   04/10/14 1333  BP: 136/78  Pulse: 89  Height: 4' 9.75" (1.467 m)  Weight: 136 lb (61.689 kg)   Body mass index is 28.66 kg/(m^2).  Generalized: Well developed, in no acute distress  Head: normocephalic and atraumatic,. Oropharynx benign  Neck: Supple, no carotid bruits  Cardiac: Regular rate rhythm, no murmur  Musculoskeletal: Arthritic distal fingers flexion deformity of hands Skin minimal  bruising Neurological examination   Mentation: Alert oriented to time, place, history taking. Follows all commands speech and language fluent  Cranial nerve II-XII: Fundoscopic exam not done.Pupils were equal round reactive to light extraocular movements were full, visual field were full on confrontational test. Facial sensation and strength were normal. hearing was intact to finger rubbing bilaterally. Uvula tongue midline. head turning and shoulder shrug  were normal and symmetric.Tongue protrusion into cheek strength was normal. Motor: normal bulk and tone, full strength in the BUE, BLE,  No focal weakness Sensory: normal and symmetric to light touch, pinprick, and  vibration  Coordination: finger-nose-finger, heel-to-shin bilaterally, no dysmetria Reflexes: 1+ upper lower and symmetric, plantar responses were flexor bilaterally. Gait and Station: Rising up from seated position without assistance, normal stance,  moderate stride, good arm swing, smooth turning, able to perform tiptoe, and heel walking without difficulty. Tandem gait is mildly unsteady  DIAGNOSTIC DATA (LABS, IMAGING, TESTING) -    ASSESSMENT AND PLAN  80 y.o. 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 Vascular risk factors of age, sex, hypertension atrial fib and hyperlipidemia.  Continue Coumadin for  secondary stroke prevention and atrial fibrillation Control blood pressure below 130/90 Control of lipids with LDL cholesterol below 100 Carotid Doppler study was negative for stenosis after last visit, given copy  Followup in 6 months Dennie Bible, [facility], [facility], [facility] [address], [place], [place] [address] [phone]

## 2014-04-10 NOTE — Patient Instructions (Addendum)
Continue use of  Coumadin for secondary stroke prevention and atrial fibrillation Control blood pressure below 130/90 Control of lipids with LDL cholesterol below 100 Carotid Doppler study was negative for stenosis after last visit Followup in 6 months

## 2014-09-29 ENCOUNTER — Observation Stay: Payer: Self-pay | Admitting: Internal Medicine

## 2014-09-29 LAB — URINALYSIS, COMPLETE
BLOOD: NEGATIVE
Bilirubin,UR: NEGATIVE
GLUCOSE, UR: NEGATIVE mg/dL (ref 0–75)
Ketone: NEGATIVE
LEUKOCYTE ESTERASE: NEGATIVE
Nitrite: NEGATIVE
PH: 6 (ref 4.5–8.0)
Protein: NEGATIVE
RBC,UR: 1 /HPF (ref 0–5)
SPECIFIC GRAVITY: 1.035 (ref 1.003–1.030)
Squamous Epithelial: <1

## 2014-09-29 LAB — APTT: Activated PTT: 28.3 secs (ref 23.6–35.9)

## 2014-09-29 LAB — COMPREHENSIVE METABOLIC PANEL
Albumin: 3.1 g/dL — ABNORMAL LOW (ref 3.4–5.0)
Alkaline Phosphatase: 64 U/L
Anion Gap: 9 (ref 7–16)
BILIRUBIN TOTAL: 0.4 mg/dL (ref 0.2–1.0)
BUN: 22 mg/dL — ABNORMAL HIGH (ref 7–18)
Calcium, Total: 8.4 mg/dL — ABNORMAL LOW (ref 8.5–10.1)
Chloride: 109 mmol/L — ABNORMAL HIGH (ref 98–107)
Co2: 25 mmol/L (ref 21–32)
Creatinine: 1.02 mg/dL (ref 0.60–1.30)
EGFR (African American): >60
EGFR (Non-African Amer.): 55 — ABNORMAL LOW
Glucose: 128 mg/dL — ABNORMAL HIGH (ref 65–99)
OSMOLALITY: 290 (ref 275–301)
Potassium: 4 mmol/L (ref 3.5–5.1)
SGOT(AST): 27 U/L (ref 15–37)
SGPT (ALT): 21 U/L
Sodium: 143 mmol/L (ref 136–145)
TOTAL PROTEIN: 6.6 g/dL (ref 6.4–8.2)

## 2014-09-29 LAB — CBC
HCT: 40.3 % (ref 35.0–47.0)
HGB: 13.3 g/dL (ref 12.0–16.0)
MCH: 33 pg (ref 26.0–34.0)
MCHC: 32.9 g/dL (ref 32.0–36.0)
MCV: 100 fL (ref 80–100)
PLATELETS: 282 10*3/uL (ref 150–440)
RBC: 4.02 10*6/uL (ref 3.80–5.20)
RDW: 12.8 % (ref 11.5–14.5)
WBC: 10.8 10*3/uL (ref 3.6–11.0)

## 2014-09-29 LAB — CK-MB: CK-MB: <0.5 ng/mL — ABNORMAL LOW (ref 0.5–3.6)

## 2014-09-29 LAB — PROTIME-INR
INR: 1.9
Prothrombin Time: 21 secs — ABNORMAL HIGH (ref 11.5–14.7)

## 2014-09-29 LAB — TROPONIN I
Troponin-I: <0.02 ng/mL
Troponin-I: <0.02 ng/mL

## 2014-09-30 DIAGNOSIS — R079 Chest pain, unspecified: Secondary | ICD-10-CM

## 2014-09-30 LAB — LIPID PANEL
CHOLESTEROL: 202 mg/dL — AB (ref 0–200)
HDL: 62 mg/dL — AB (ref 40–60)
Ldl Cholesterol, Calc: 121 mg/dL — ABNORMAL HIGH (ref 0–100)
Triglycerides: 96 mg/dL (ref 0–200)
VLDL Cholesterol, Calc: 19 mg/dL (ref 5–40)

## 2014-09-30 LAB — CK-MB: CK-MB: 0.5 ng/mL (ref 0.5–3.6)

## 2014-09-30 LAB — TROPONIN I: Troponin-I: <0.02 ng/mL

## 2014-10-09 ENCOUNTER — Telehealth: Payer: Self-pay | Admitting: *Deleted

## 2014-10-09 NOTE — Telephone Encounter (Signed)
Information for patients appointment tomorrow only, no need to call back:  Patients daughter wants to let you know that the patient is a little more forget, having problems with short term memory. She has had a couple of occasions where she is lightheaded and since she has had a stroke before, the daughter wanted you to be aware of that. The patients bottom lip also quivers at times. It's not constant, but it does come and go.  [name] didn't feel comfortable going over these things in front of the patient, but she did want to make you aware.

## 2014-10-09 NOTE — Telephone Encounter (Signed)
Noted  

## 2014-10-10 ENCOUNTER — Encounter: Payer: Self-pay | Admitting: Nurse Practitioner

## 2014-10-10 ENCOUNTER — Ambulatory Visit (INDEPENDENT_AMBULATORY_CARE_PROVIDER_SITE_OTHER): Payer: Medicare Other | Admitting: Nurse Practitioner

## 2014-10-10 VITALS — BP 135/79 | HR 90 | Ht <= 58 in | Wt 134.0 lb

## 2014-10-10 DIAGNOSIS — I6350 Cerebral infarction due to unspecified occlusion or stenosis of unspecified cerebral artery: Secondary | ICD-10-CM

## 2014-10-10 DIAGNOSIS — I6529 Occlusion and stenosis of unspecified carotid artery: Secondary | ICD-10-CM

## 2014-10-10 DIAGNOSIS — H538 Other visual disturbances: Secondary | ICD-10-CM

## 2014-10-10 DIAGNOSIS — I6389 Other cerebral infarction: Secondary | ICD-10-CM

## 2014-10-10 NOTE — Progress Notes (Signed)
[facility]  PATIENT: Peggy [name] DOB: Dec 09, 1928   REASON FOR VISIT: Followup for history of stroke   HISTORY OF PRESENT ILLNESS:[name], 80 year old female returns for followup. She was last seen  04/10/14. Carotid Doppler after her visit was negative for stenosis in September 2014. She returns today denying stroke or TIA symptoms. She has not had further episodes of blurred vision. She has been having some hip and knee pain and she is due to see an orthopedist next week. She has arthritis.  She has minimal bruising from her warfarin. She continues to live alone and is fully independent in all activities of daily living. She continues to walk for exercise. She is driving without difficulty. Her daughter reports short-term memory loss will check memory score. She has no neurologic complaints.  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 Vascular risk factors of age, sex, hyperlipidemia, hypertension and atrial fibrillation    REVIEW OF SYSTEMS: Full 14 system review of systems performed and notable only for those listed, all others are neg:  Constitutional: N/A  Cardiovascular: N/A  Ear/Nose/Throat: N/A  Skin: N/A  Eyes: N/A  Respiratory: N/A  Gastroitestinal: N/A  Hematology/Lymphatic: N/A  Endocrine: N/A Musculoskeletal: Joint pain, joint swelling Allergy/Immunology: N/A  Neurological: Memory loss  Psychiatric: N/A Sleep : NA   ALLERGIES: Allergies  Allergen Reactions  . Alendronate Sodium     REACTION: questionable  . Amoxicillin-Pot Clavulanate   . Etodolac   . Ibandronate Sodium     REACTION: questionable  . Lactose Intolerance (Gi) Other (See Comments)    GI problems  . Minocycline Hcl   . Risedronate Sodium   . Sulfonamide Derivatives Other (See Comments)    Stomach pain  . Zoledronic Acid     REACTION: "dental problems"    HOME  MEDICATIONS: Outpatient Prescriptions Prior to Visit  Medication Sig Dispense Refill  . acetaminophen (TYLENOL) 325 MG tablet Take 325 mg by mouth as needed.      . ALPRAZolam (XANAX) 0.25 MG tablet Take 0.25 mg by mouth at bedtime.       Marland Kitchen aspirin 81 MG tablet Take 81 mg by mouth daily.      Marland Kitchen DILTIAZEM HCL PO Take by mouth.      . escitalopram (LEXAPRO) 10 MG tablet Take 10 mg by mouth daily. 1/4 in the am      . Lansoprazole (PREVACID PO) Take by mouth daily.      Marland Kitchen warfarin (COUMADIN) 3 MG tablet Take 4.5 mg by mouth daily at 6 PM.        No facility-administered medications prior to visit.    PAST MEDICAL HISTORY: Past Medical History  Diagnosis Date  . Essential hypertension, benign   . Arthritis   . Heart disease 2001  . Hypertension 1996  . Stroke 2009  . Heart attack 2001  . Unspecified essential hypertension   . Hearing disorder 2012    hearing aids  . Reflux     acid reflux  . Breast screening, unspecified   . Lump or mass in breast   . Special screening for malignant neoplasms, colon   . Other benign neoplasm of connective and other soft tissue of thorax   . Personal history of malignant neoplasm of breast   . Breast cancer 2012    Patient underwent a left mastectomy on 03-31-11. Pathology showed DCIS at both sites without an invasive  component. Sentinel nodes were negative. The breast lesions were noted to be ER +40%, PR-positive, 5%.   . Malignant neoplasm of upper-inner quadrant of female breast   . Malignant neoplasm of upper-outer quadrant of female breast   . Atrial fibrillation   . Allergy     PAST SURGICAL HISTORY: Past Surgical History  Procedure Laterality Date  . Mastectomy  2012    left breast  . Breast biopsy      left breast biopsy over 15 years ago   . Upper gi endoscopy  2009    [facility] Dr. Donnella [name]  . Colonoscopy  2009    [facility] Dr. Donnella [name]  . Appendectomy      age 26  . Tonsillectomy      as a child  . Cholecystectomy      40 years ago    . Abdominal hysterectomy      age 59    FAMILY HISTORY: Family History  Problem Relation Age of Onset  . Colon cancer Neg Hx   . Ovarian cancer Neg Hx   . Breast cancer Sister     x 2, in their 30's  . Lung cancer Brother   . Stroke Father   . Heart disease Father   . Heart disease Mother     SOCIAL HISTORY: History   Social History  . Marital Status: Widowed    Spouse Name: N/A    Number of Children: 2  . Years of Education: 8   Occupational History  . Retired    Social History Main Topics  . Smoking status: Never Smoker   . Smokeless tobacco: Never Used  . Alcohol Use: No  . Drug Use: No  . Sexual Activity: Not on file   Other Topics Concern  . Not on file   Social History Narrative   Patient lives at home alone.    Patient has 2 children.    Patient has a 8th education.    Patient is widowed.    Patient is right handed.    Patient is retired.      PHYSICAL EXAM  Filed Vitals:   10/10/14 1424  BP: 135/79  Pulse: 90  Height: 4' 9.75" (1.467 m)  Weight: 134 lb (60.782 kg)   Body mass index is 28.24 kg/(m^2). Generalized: Well developed, in no acute distress  Head: normocephalic and atraumatic,. Oropharynx benign  Neck: Supple, no carotid bruits  Cardiac: Regular rate rhythm, no murmur  Musculoskeletal: Arthritic distal fingers flexion deformity of hands  Skin minimal bruising  Neurological examination  Mentation: Alert oriented to time, place, history taking. MMSE 28/30 missing one item in short-term recall and unable to copy a figure . AFT 9. GDS=0.Follows all commands speech and language fluent  Cranial nerve II-XII: Fundoscopic exam not done.Pupils were equal round reactive to light extraocular movements were full, visual field were full on confrontational test. Facial sensation and strength were normal. hearing was intact to finger rubbing bilaterally. Uvula tongue midline. head turning and shoulder shrug were normal and symmetric.Tongue  protrusion into cheek strength was normal.  Motor: normal bulk and tone, full strength in the BUE, BLE, No focal weakness  Sensory: normal and symmetric to light touch, pinprick, and vibration  Coordination: finger-nose-finger, heel-to-shin bilaterally, no dysmetria  Reflexes: 1+ upper lower and symmetric, plantar responses were flexor bilaterally.  Gait and Station: Rising up from seated position without assistance, normal stance, moderate stride, good arm swing, smooth turning, able to perform tiptoe, and heel walking  without difficulty. Tandem gait is mildly unsteady. No assistive device  DIAGNOSTIC DATA (LABS, IMAGING, TESTING) - ASSESSMENT AND PLAN  80 y.o. year old female  has a past medical history transient visual disturbances and silent right temporal infarct in 2010 from small vessel disease. Vascular risk factors of age, sex, hypertension, atrial fibrillation and hyperlipidemia.The patient is a current patient of Dr. Leonie Man who is out of the office today . This note is sent to the work in doctor.     Continue Coumadin for secondary stroke prevention and atrial fibrillation Control of blood pressure with reading  below 130/90 today 135/79 Last carotid Doppler study was negative September of 2014 Call for any stroke like symptoms Followup yearly, next visit with Dr.Sethi Dennie Bible, [facility], [facility], [facility] [address] [place], [place] [address] [phone]

## 2014-10-10 NOTE — Progress Notes (Signed)
I agree with the assessment and plan as directed by NP .The patient is known to me .   Remington Skalsky, MD  

## 2014-10-10 NOTE — Patient Instructions (Addendum)
Continue Coumadin for secondary stroke prevention and atrial fibrillation Control of blood pressure with reading  below 130/90 Labs carotid Doppler study was negative September of 2014 Followup yearly, next visit with Dr.Sethi

## 2014-10-22 DIAGNOSIS — I4891 Unspecified atrial fibrillation: Secondary | ICD-10-CM

## 2014-10-22 HISTORY — DX: Unspecified atrial fibrillation: I48.91

## 2014-10-23 ENCOUNTER — Encounter: Payer: Self-pay | Admitting: Nurse Practitioner

## 2014-10-23 ENCOUNTER — Inpatient Hospital Stay: Payer: Self-pay | Admitting: Internal Medicine

## 2014-10-23 LAB — URINALYSIS, COMPLETE
Bacteria: NONE SEEN
Bilirubin,UR: NEGATIVE
Blood: NEGATIVE
GLUCOSE, UR: NEGATIVE mg/dL (ref 0–75)
Ketone: NEGATIVE
Leukocyte Esterase: NEGATIVE
NITRITE: NEGATIVE
Ph: 6 (ref 4.5–8.0)
Protein: NEGATIVE
SPECIFIC GRAVITY: 1.011 (ref 1.003–1.030)
SQUAMOUS EPITHELIAL: NONE SEEN

## 2014-10-23 LAB — BASIC METABOLIC PANEL
ANION GAP: 10 (ref 7–16)
BUN: 17 mg/dL (ref 7–18)
CALCIUM: 8.1 mg/dL — AB (ref 8.5–10.1)
CHLORIDE: 109 mmol/L — AB (ref 98–107)
CREATININE: 0.8 mg/dL (ref 0.60–1.30)
Co2: 25 mmol/L (ref 21–32)
Glucose: 133 mg/dL — ABNORMAL HIGH (ref 65–99)
POTASSIUM: 3.6 mmol/L (ref 3.5–5.1)
Sodium: 144 mmol/L (ref 136–145)

## 2014-10-23 LAB — HEMOGLOBIN A1C: HEMOGLOBIN A1C: 5.5 % (ref 4.2–6.3)

## 2014-10-23 LAB — CBC
HCT: 39.4 % (ref 35.0–47.0)
HGB: 13 g/dL (ref 12.0–16.0)
MCH: 33.5 pg (ref 26.0–34.0)
MCHC: 33.1 g/dL (ref 32.0–36.0)
MCV: 101 fL — ABNORMAL HIGH (ref 80–100)
Platelet: 310 10*3/uL (ref 150–440)
RBC: 3.9 10*6/uL (ref 3.80–5.20)
RDW: 12.8 % (ref 11.5–14.5)
WBC: 10.6 10*3/uL (ref 3.6–11.0)

## 2014-10-23 LAB — PROTIME-INR
INR: 1
Prothrombin Time: 13.5 secs (ref 11.5–14.7)

## 2014-10-23 LAB — TROPONIN I
TROPONIN-I: 0.04 ng/mL
Troponin-I: 0.02 ng/mL

## 2014-10-23 LAB — PRO B NATRIURETIC PEPTIDE: B-TYPE NATIURETIC PEPTID: 372 pg/mL (ref 0–450)

## 2014-10-23 LAB — CK TOTAL AND CKMB (NOT AT ARMC)
CK, Total: 84 U/L
CK, Total: 61 U/L
CK-MB: 0.5 ng/mL (ref 0.5–3.6)
CK-MB: 0.7 ng/mL (ref 0.5–3.6)

## 2014-10-23 LAB — MAGNESIUM: Magnesium: 2 mg/dL

## 2014-10-23 LAB — APTT: ACTIVATED PTT: 26.2 s (ref 23.6–35.9)

## 2014-10-24 LAB — COMPREHENSIVE METABOLIC PANEL
ALBUMIN: 2.9 g/dL — AB (ref 3.4–5.0)
Alkaline Phosphatase: 71 U/L
Anion Gap: 8 (ref 7–16)
BILIRUBIN TOTAL: 0.7 mg/dL (ref 0.2–1.0)
BUN: 13 mg/dL (ref 7–18)
CHLORIDE: 109 mmol/L — AB (ref 98–107)
CREATININE: 0.73 mg/dL (ref 0.60–1.30)
Calcium, Total: 8.6 mg/dL (ref 8.5–10.1)
Co2: 26 mmol/L (ref 21–32)
EGFR (Non-African Amer.): >60
Glucose: 102 mg/dL — ABNORMAL HIGH (ref 65–99)
Osmolality: 285 (ref 275–301)
Potassium: 4 mmol/L (ref 3.5–5.1)
SGOT(AST): 28 U/L (ref 15–37)
SGPT (ALT): 27 U/L
Sodium: 143 mmol/L (ref 136–145)
Total Protein: 6.4 g/dL (ref 6.4–8.2)

## 2014-10-24 LAB — CBC WITH DIFFERENTIAL/PLATELET
BASOS ABS: 0.1 10*3/uL (ref 0.0–0.1)
Basophil %: 1 %
EOS ABS: 0.2 10*3/uL (ref 0.0–0.7)
Eosinophil %: 1.8 %
HCT: 39.8 % (ref 35.0–47.0)
HGB: 13.2 g/dL (ref 12.0–16.0)
Lymphocyte #: 4.4 10*3/uL — ABNORMAL HIGH (ref 1.0–3.6)
Lymphocyte %: 39.3 %
MCH: 33.4 pg (ref 26.0–34.0)
MCHC: 33.1 g/dL (ref 32.0–36.0)
MCV: 101 fL — ABNORMAL HIGH (ref 80–100)
MONO ABS: 1 x10 3/mm — AB (ref 0.2–0.9)
MONOS PCT: 8.9 %
NEUTROS ABS: 5.5 10*3/uL (ref 1.4–6.5)
Neutrophil %: 49 %
Platelet: 298 10*3/uL (ref 150–440)
RBC: 3.94 10*6/uL (ref 3.80–5.20)
RDW: 13 % (ref 11.5–14.5)
WBC: 11.1 10*3/uL — AB (ref 3.6–11.0)

## 2014-10-24 LAB — CK TOTAL AND CKMB (NOT AT ARMC)
CK, TOTAL: 58 U/L
CK-MB: 0.7 ng/mL (ref 0.5–3.6)

## 2014-10-24 LAB — TROPONIN I: TROPONIN-I: 0.04 ng/mL

## 2014-10-24 LAB — HEPARIN LEVEL (UNFRACTIONATED): Anti-Xa(Unfractionated): 0.4 IU/mL (ref 0.30–0.70)

## 2014-10-24 LAB — PROTIME-INR
INR: 1
PROTHROMBIN TIME: 13.5 s (ref 11.5–14.7)

## 2014-10-25 LAB — CBC WITH DIFFERENTIAL/PLATELET
BASOS PCT: 1.2 %
Basophil #: 0.1 10*3/uL (ref 0.0–0.1)
EOS ABS: 0.2 10*3/uL (ref 0.0–0.7)
EOS PCT: 2.5 %
HCT: 36 % (ref 35.0–47.0)
HGB: 11.2 g/dL — ABNORMAL LOW (ref 12.0–16.0)
Lymphocyte #: 3.2 10*3/uL (ref 1.0–3.6)
Lymphocyte %: 38.2 %
MCH: 30.7 pg (ref 26.0–34.0)
MCHC: 31.2 g/dL — ABNORMAL LOW (ref 32.0–36.0)
MCV: 98 fL (ref 80–100)
MONO ABS: 0.9 x10 3/mm (ref 0.2–0.9)
MONOS PCT: 10.1 %
Neutrophil #: 4.1 10*3/uL (ref 1.4–6.5)
Neutrophil %: 48 %
PLATELETS: 307 10*3/uL (ref 150–440)
RBC: 3.66 10*6/uL — AB (ref 3.80–5.20)
RDW: 12.5 % (ref 11.5–14.5)
WBC: 8.5 10*3/uL (ref 3.6–11.0)

## 2014-10-25 LAB — PROTIME-INR
INR: 1.3
Prothrombin Time: 16 secs — ABNORMAL HIGH (ref 11.5–14.7)

## 2014-10-25 LAB — HEPARIN LEVEL (UNFRACTIONATED): Anti-Xa(Unfractionated): 0.27 IU/mL — ABNORMAL LOW (ref 0.30–0.70)

## 2014-11-08 ENCOUNTER — Encounter: Payer: Self-pay | Admitting: Neurology

## 2014-11-14 ENCOUNTER — Encounter: Payer: Self-pay | Admitting: Neurology

## 2015-02-15 ENCOUNTER — Encounter: Payer: Self-pay | Admitting: General Surgery

## 2015-02-21 ENCOUNTER — Emergency Department: Payer: Self-pay | Admitting: Emergency Medicine

## 2015-03-05 ENCOUNTER — Encounter: Payer: Self-pay | Admitting: General Surgery

## 2015-03-11 ENCOUNTER — Emergency Department (HOSPITAL_COMMUNITY): Payer: Medicare Other

## 2015-03-11 ENCOUNTER — Encounter (HOSPITAL_COMMUNITY): Payer: Self-pay | Admitting: Emergency Medicine

## 2015-03-11 ENCOUNTER — Observation Stay (HOSPITAL_COMMUNITY)
Admission: EM | Admit: 2015-03-11 | Discharge: 2015-03-12 | Disposition: A | Discharge status: Home / Self Care | Payer: Medicare Other | Attending: Internal Medicine | Admitting: Internal Medicine

## 2015-03-11 ENCOUNTER — Other Ambulatory Visit (HOSPITAL_COMMUNITY): Payer: Self-pay

## 2015-03-11 DIAGNOSIS — I252 Old myocardial infarction: Secondary | ICD-10-CM | POA: Insufficient documentation

## 2015-03-11 DIAGNOSIS — I2510 Atherosclerotic heart disease of native coronary artery without angina pectoris: Secondary | ICD-10-CM | POA: Insufficient documentation

## 2015-03-11 DIAGNOSIS — Z880 Allergy status to penicillin: Secondary | ICD-10-CM | POA: Diagnosis not present

## 2015-03-11 DIAGNOSIS — Z882 Allergy status to sulfonamides status: Secondary | ICD-10-CM | POA: Insufficient documentation

## 2015-03-11 DIAGNOSIS — Z90710 Acquired absence of both cervix and uterus: Secondary | ICD-10-CM | POA: Diagnosis not present

## 2015-03-11 DIAGNOSIS — I10 Essential (primary) hypertension: Secondary | ICD-10-CM | POA: Insufficient documentation

## 2015-03-11 DIAGNOSIS — Z1211 Encounter for screening for malignant neoplasm of colon: Secondary | ICD-10-CM | POA: Diagnosis not present

## 2015-03-11 DIAGNOSIS — I4891 Unspecified atrial fibrillation: Secondary | ICD-10-CM | POA: Diagnosis present

## 2015-03-11 DIAGNOSIS — R079 Chest pain, unspecified: Secondary | ICD-10-CM | POA: Diagnosis not present

## 2015-03-11 DIAGNOSIS — Z8673 Personal history of transient ischemic attack (TIA), and cerebral infarction without residual deficits: Secondary | ICD-10-CM | POA: Diagnosis not present

## 2015-03-11 DIAGNOSIS — Z9012 Acquired absence of left breast and nipple: Secondary | ICD-10-CM | POA: Diagnosis not present

## 2015-03-11 DIAGNOSIS — R1013 Epigastric pain: Secondary | ICD-10-CM | POA: Diagnosis not present

## 2015-03-11 DIAGNOSIS — Z888 Allergy status to other drugs, medicaments and biological substances status: Secondary | ICD-10-CM | POA: Diagnosis not present

## 2015-03-11 DIAGNOSIS — Z7901 Long term (current) use of anticoagulants: Secondary | ICD-10-CM | POA: Diagnosis not present

## 2015-03-11 DIAGNOSIS — K219 Gastro-esophageal reflux disease without esophagitis: Secondary | ICD-10-CM | POA: Insufficient documentation

## 2015-03-11 DIAGNOSIS — I209 Angina pectoris, unspecified: Secondary | ICD-10-CM

## 2015-03-11 LAB — COMPREHENSIVE METABOLIC PANEL
ALT: 22 U/L (ref 0–35)
AST: 18 U/L (ref 0–37)
Albumin: 3.6 g/dL (ref 3.5–5.2)
Alkaline Phosphatase: 116 U/L (ref 39–117)
Anion gap: 10 (ref 5–15)
BUN: 11 mg/dL (ref 6–23)
CALCIUM: 9.2 mg/dL (ref 8.4–10.5)
CHLORIDE: 105 mmol/L (ref 96–112)
CO2: 25 mmol/L (ref 19–32)
CREATININE: 0.86 mg/dL (ref 0.50–1.10)
GFR calc Af Amer: 70 mL/min — ABNORMAL LOW (ref >90)
GFR calc non Af Amer: 60 mL/min — ABNORMAL LOW (ref >90)
GLUCOSE: 100 mg/dL — AB (ref 70–99)
POTASSIUM: 4.1 mmol/L (ref 3.5–5.1)
SODIUM: 140 mmol/L (ref 135–145)
TOTAL PROTEIN: 6.4 g/dL (ref 6.0–8.3)
Total Bilirubin: 1.1 mg/dL (ref 0.3–1.2)

## 2015-03-11 LAB — I-STAT TROPONIN, ED: Troponin i, poc: 0.01 ng/mL (ref 0.00–0.08)

## 2015-03-11 LAB — CBC WITH DIFFERENTIAL/PLATELET
Basophils Absolute: 0 10*3/uL (ref 0.0–0.1)
Basophils Relative: 0 % (ref 0–1)
EOS ABS: 0.1 10*3/uL (ref 0.0–0.7)
EOS PCT: 0 % (ref 0–5)
HEMATOCRIT: 42 % (ref 36.0–46.0)
Hemoglobin: 13.7 g/dL (ref 12.0–15.0)
LYMPHS ABS: 3.3 10*3/uL (ref 0.7–4.0)
Lymphocytes Relative: 29 % (ref 12–46)
MCH: 32.8 pg (ref 26.0–34.0)
MCHC: 32.6 g/dL (ref 30.0–36.0)
MCV: 100.5 fL — ABNORMAL HIGH (ref 78.0–100.0)
Monocytes Absolute: 1.1 10*3/uL — ABNORMAL HIGH (ref 0.1–1.0)
Monocytes Relative: 9 % (ref 3–12)
NEUTROS ABS: 6.9 10*3/uL (ref 1.7–7.7)
Neutrophils Relative %: 62 % (ref 43–77)
Platelets: 267 10*3/uL (ref 150–400)
RBC: 4.18 MIL/uL (ref 3.87–5.11)
RDW: 14.4 % (ref 11.5–15.5)
WBC: 11.3 10*3/uL — ABNORMAL HIGH (ref 4.0–10.5)

## 2015-03-11 LAB — URINALYSIS, ROUTINE W REFLEX MICROSCOPIC
BILIRUBIN URINE: NEGATIVE
GLUCOSE, UA: NEGATIVE mg/dL
HGB URINE DIPSTICK: NEGATIVE
KETONES UR: 15 mg/dL — AB
LEUKOCYTES UA: NEGATIVE
Nitrite: NEGATIVE
PH: 6.5 (ref 5.0–8.0)
PROTEIN: NEGATIVE mg/dL
SPECIFIC GRAVITY, URINE: 1.018 (ref 1.005–1.030)
UROBILINOGEN UA: 0.2 mg/dL (ref 0.0–1.0)

## 2015-03-11 LAB — TROPONIN I
TROPONIN I: 0.03 ng/mL (ref <0.031)
Troponin I: 0.03 ng/mL (ref <0.031)

## 2015-03-11 LAB — BRAIN NATRIURETIC PEPTIDE: B NATRIURETIC PEPTIDE 5: 382.2 pg/mL — AB (ref 0.0–100.0)

## 2015-03-11 LAB — PROTIME-INR
INR: 3.46 — AB (ref 0.00–1.49)
PROTHROMBIN TIME: 35.1 s — AB (ref 11.6–15.2)

## 2015-03-11 LAB — LIPASE, BLOOD: LIPASE: 23 U/L (ref 11–59)

## 2015-03-11 MED ORDER — DILTIAZEM HCL 25 MG/5ML IV SOLN
10.0000 mg | Freq: Once | INTRAVENOUS | Status: AC
  Administered 2015-03-11: 10 mg via INTRAVENOUS

## 2015-03-11 MED ORDER — ASPIRIN 81 MG PO TABS: 81.0000 mg | ORAL_TABLET | Freq: Every day | ORAL | Status: DC

## 2015-03-11 MED ORDER — METOPROLOL TARTRATE 1 MG/ML IV SOLN
2.5000 mg | Freq: Once | INTRAVENOUS | Status: AC
  Administered 2015-03-11: 2.5 mg via INTRAVENOUS
  Filled 2015-03-11: qty 5

## 2015-03-11 MED ORDER — ONDANSETRON HCL 4 MG/2ML IJ SOLN: 4.0000 mg | Freq: Four times a day (QID) | INTRAMUSCULAR | Status: DC | PRN

## 2015-03-11 MED ORDER — ESCITALOPRAM OXALATE 10 MG PO TABS
10.0000 mg | ORAL_TABLET | Freq: Every day | ORAL | Status: DC
  Administered 2015-03-12: 10 mg via ORAL
  Filled 2015-03-11: qty 1

## 2015-03-11 MED ORDER — NITROGLYCERIN 0.4 MG SL SUBL: 0.4000 mg | SUBLINGUAL_TABLET | SUBLINGUAL | Status: DC | PRN

## 2015-03-11 MED ORDER — PANTOPRAZOLE SODIUM 40 MG PO TBEC
40.0000 mg | DELAYED_RELEASE_TABLET | Freq: Every day | ORAL | Status: DC
  Administered 2015-03-12: 40 mg via ORAL
  Filled 2015-03-11: qty 1

## 2015-03-11 MED ORDER — GI COCKTAIL ~~LOC~~
30.0000 mL | Freq: Four times a day (QID) | ORAL | Status: DC | PRN
  Filled 2015-03-11: qty 30

## 2015-03-11 MED ORDER — CYCLOBENZAPRINE HCL 10 MG PO TABS
5.0000 mg | ORAL_TABLET | Freq: Three times a day (TID) | ORAL | Status: DC | PRN
  Administered 2015-03-11: 5 mg via ORAL
  Filled 2015-03-11: qty 1

## 2015-03-11 MED ORDER — DILTIAZEM HCL ER COATED BEADS 240 MG PO CP24
240.0000 mg | ORAL_CAPSULE | Freq: Every day | ORAL | Status: DC
  Administered 2015-03-12: 240 mg via ORAL
  Filled 2015-03-11: qty 1

## 2015-03-11 MED ORDER — METOPROLOL TARTRATE 25 MG PO TABS
25.0000 mg | ORAL_TABLET | Freq: Two times a day (BID) | ORAL | Status: DC
Start: 2015-03-11 — End: 2015-03-11
  Filled 2015-03-11: qty 1

## 2015-03-11 MED ORDER — ASPIRIN EC 81 MG PO TBEC
81.0000 mg | DELAYED_RELEASE_TABLET | Freq: Every day | ORAL | Status: DC
  Administered 2015-03-12: 81 mg via ORAL
  Filled 2015-03-11 (×2): qty 1

## 2015-03-11 MED ORDER — ALPRAZOLAM 0.25 MG PO TABS
0.2500 mg | ORAL_TABLET | Freq: Every day | ORAL | Status: DC
  Administered 2015-03-11: 0.25 mg via ORAL
  Filled 2015-03-11: qty 1

## 2015-03-11 MED ORDER — METOPROLOL TARTRATE 25 MG PO TABS
25.0000 mg | ORAL_TABLET | Freq: Two times a day (BID) | ORAL | Status: DC
  Administered 2015-03-11 – 2015-03-12 (×2): 25 mg via ORAL
  Filled 2015-03-11 (×3): qty 1

## 2015-03-11 MED ORDER — DEXTROSE 5 % IV SOLN
5.0000 mg/h | Freq: Once | INTRAVENOUS | Status: AC
Start: 2015-03-11 — End: 2015-03-11
  Administered 2015-03-11: 5 mg/h via INTRAVENOUS

## 2015-03-11 MED ORDER — MORPHINE SULFATE 2 MG/ML IJ SOLN: 1.0000 mg | INTRAMUSCULAR | Status: DC | PRN

## 2015-03-11 MED ORDER — ACETAMINOPHEN 325 MG PO TABS: 650.0000 mg | ORAL_TABLET | ORAL | Status: DC | PRN

## 2015-03-11 MED ORDER — DILTIAZEM HCL ER COATED BEADS 240 MG PO CP24: 240.0000 mg | ORAL_CAPSULE | Freq: Every day | ORAL | Status: DC

## 2015-03-11 NOTE — ED Provider Notes (Signed)
CSN: [id number]     Arrival date & time 03/11/15  1352 History   First MD Initiated Contact with Patient 03/11/15 1409     Chief Complaint  Patient presents with  . Chest Pain     (Consider location/radiation/quality/duration/timing/severity/associated sxs/prior Treatment) HPI The patient was at church feeling well this morning. She came home from church and developed a chest pain in the center of her chest that radiated to her left arm. She took 3 nitroglycerin and the pain resolved. She reports after that however she did feel lightheaded and dizzy. She denies she had nausea or vomiting. She did not have diaphoresis. Patient denies shortness of breath. There was no loss of consciousness. She has known atrial fibrillation and has a distant history of a heart attack. She reports she had been feeling well for these past several weeks. The only thing of note is that she's felt she was constipated. Past Medical History  Diagnosis Date  . Essential hypertension, benign   . Arthritis   . Heart disease 2001  . Hypertension 1996  . Stroke 2009  . Heart attack 2001  . Unspecified essential hypertension   . Hearing disorder 2012    hearing aids  . Reflux     acid reflux  . Breast screening, unspecified   . Lump or mass in breast   . Special screening for malignant neoplasms, colon   . Other benign neoplasm of connective and other soft tissue of thorax   . Personal history of malignant neoplasm of breast   . Breast cancer 2012    Patient underwent a left mastectomy on 03-31-11. Pathology showed DCIS at both sites without an invasive component. Sentinel nodes were negative. The breast lesions were noted to be ER +40%, PR-positive, 5%.   . Malignant neoplasm of upper-inner quadrant of female breast   . Malignant neoplasm of upper-outer quadrant of female breast   . Atrial fibrillation   . Allergy    Past Surgical History  Procedure Laterality Date  . Mastectomy  2012    left breast  .  Breast biopsy      left breast biopsy over 15 years ago   . Upper gi endoscopy  2009    [facility] Dr. Donnella Sham  . Colonoscopy  2009    [facility] Dr. Donnella Sham  . Appendectomy      age 30  . Tonsillectomy      as a child  . Cholecystectomy      40 years ago   . Abdominal hysterectomy      age 29   Family History  Problem Relation Age of Onset  . Colon cancer Neg Hx   . Ovarian cancer Neg Hx   . Breast cancer Sister     x 2, in their 47's  . Lung cancer Brother   . Stroke Father   . Heart disease Father   . Heart disease Mother    History  Substance Use Topics  . Smoking status: Never Smoker   . Smokeless tobacco: Never Used  . Alcohol Use: No   OB History    Gravida Para Term Preterm AB TAB SAB Ectopic Multiple Living   2 2        2       Obstetric Comments   Age first pregnancy 78 Age first period 21     Review of Systems 10 Systems reviewed and are negative for acute change except as noted in the HPI.    Allergies  Alendronate sodium; Amoxicillin-pot clavulanate; Etodolac; Ibandronate sodium; Lactose intolerance (gi); Minocycline hcl; Risedronate sodium; Sulfonamide derivatives; and Zoledronic acid  Home Medications   Prior to Admission medications   Medication Sig Start Date End Date Taking? Authorizing Provider  acetaminophen (TYLENOL) 325 MG tablet Take 325 mg by mouth as needed.    Historical Provider, MD  ALPRAZolam Duanne Moron) 0.25 MG tablet Take 0.25 mg by mouth at bedtime.     Historical Provider, MD  aspirin 81 MG tablet Take 81 mg by mouth daily.    Historical Provider, MD  CALCIUM PO Take by mouth.    Historical Provider, MD  DILTIAZEM HCL PO Take by mouth.    Historical Provider, MD  escitalopram (LEXAPRO) 10 MG tablet Take 10 mg by mouth daily. 1/4 in the am    Historical Provider, MD  Lansoprazole (PREVACID PO) Take by mouth daily.    Historical Provider, MD  Multiple Vitamin (MULTI-VITAMINS) TABS Take by mouth.    Historical Provider, MD  warfarin (COUMADIN)  3 MG tablet Take 4.5 mg by mouth daily at 6 PM.     Historical Provider, MD   BP 96/57 mmHg  Pulse 137  Temp(Src) 98.3 F (36.8 C) (Oral)  Resp 24  Ht 4\' 8"  (1.422 m)  Wt 134 lb (60.782 kg)  BMI 30.06 kg/m2  SpO2 96% Physical Exam  Constitutional: She is oriented to person, place, and time.  The patient is moderately obese. She is alert and nontoxic. She is not in acute respiratory distress. Her color is good.  HENT:  Head: Normocephalic and atraumatic.  Mouth/Throat: Oropharynx is clear and moist.  Eyes: EOM are normal. Pupils are equal, round, and reactive to light.  Neck: Neck supple.  Cardiovascular: Intact distal pulses.   Heart rate is tachycardic and irregularly irregular  Pulmonary/Chest: Effort normal and breath sounds normal.  Abdominal: Soft. Bowel sounds are normal. She exhibits no distension. There is no tenderness.  Musculoskeletal: Normal range of motion. She exhibits no edema or tenderness.  Neurological: She is alert and oriented to person, place, and time. She has normal strength. Coordination normal. GCS eye subscore is 4. GCS verbal subscore is 5. GCS motor subscore is 6.  Skin: Skin is warm, dry and intact.  The patient has multiple ecchymotic bruises on her forearms.  Psychiatric: She has a normal mood and affect.  Vitals reviewed.   ED Course  Procedures (including critical care time) Labs Review Labs Reviewed  COMPREHENSIVE METABOLIC PANEL - Abnormal; Notable for the following:    Glucose, Bld 100 (*)    GFR calc non Af Amer 60 (*)    GFR calc Af Amer 70 (*)    All other components within normal limits  BRAIN NATRIURETIC PEPTIDE - Abnormal; Notable for the following:    B Natriuretic Peptide 382.2 (*)    All other components within normal limits  CBC WITH DIFFERENTIAL/PLATELET - Abnormal; Notable for the following:    WBC 11.3 (*)    MCV 100.5 (*)    Monocytes Absolute 1.1 (*)    All other components within normal limits  PROTIME-INR - Abnormal;  Notable for the following:    Prothrombin Time 35.1 (*)    INR 3.46 (*)    All other components within normal limits  URINALYSIS, ROUTINE W REFLEX MICROSCOPIC - Abnormal; Notable for the following:    Ketones, ur 15 (*)    All other components within normal limits  LIPASE, BLOOD  I-STAT TROPOININ, ED  Imaging Review Dg Chest Port 1 View  03/11/2015   CLINICAL DATA:  Chest pain for 3 days.  EXAM: PORTABLE CHEST - 1 VIEW  COMPARISON:  None.  FINDINGS: The cardiomediastinal silhouette is unremarkable.  There is no evidence of focal airspace disease, pulmonary edema, suspicious pulmonary nodule/mass, pleural effusion, or pneumothorax. No acute bony abnormalities are identified.  IMPRESSION: No active disease.   Electronically Signed   By: Margarette Canada M.D.   On: 03/11/2015 15:08     EKG Interpretation   Date/Time:  Sunday March 11 2015 15:23:12 EDT Ventricular Rate:  64 PR Interval:  133 QRS Duration: 107 QT Interval:  390 QTC Calculation: 402 R Axis:   -35 Text Interpretation:  Incomplete analysis due to missing data in  precordial lead(s) Sinus rhythm Multiform ventricular premature complexes  Left ventricular hypertrophy Anterior Q waves, possibly due to LVH Missing  lead(s): V3 agree Confirmed by Johnney Killian, MD, Jeannie Done [phone]) on 03/11/2015  3:30:04 PM      Consult: Patient's case reviewed with Dr. Radford Pax who recommended admission to the hospitalist for rule out MI. CRITICAL CARE Performed by: Charlesetta Shanks   Total critical care time: 30  Critical care time was exclusive of separately billable procedures and treating other patients.  Critical care was necessary to treat or prevent imminent or life-threatening deterioration.  Critical care was time spent personally by me on the following activities: development of treatment plan with patient and/or surrogate as well as nursing, discussions with consultants, evaluation of patient's response to treatment, examination of  patient, obtaining history from patient or surrogate, ordering and performing treatments and interventions, ordering and review of laboratory studies, ordering and review of radiographic studies, pulse oximetry and re-evaluation of patient's condition.  MDM   Final diagnoses:  Atrial fibrillation with rapid ventricular response  Ischemic chest pain   The patient arrived with age of fibrillation rapid ventricular response. She was initiated on Cardizem bolus and drip. There was not a lot of of response to Cardizem. After administration of metoprolol the patient spontaneously converted to a sinus rhythm with stable blood pressures and no chest pain.    Charlesetta Shanks, MD 03/11/15 [phone]

## 2015-03-11 NOTE — H&P (Addendum)
PATIENT DETAILS Name: Peggy [name] Age: 81 y.o. Sex: female Date of Birth: July 03, 1928 Admit Date: 03/11/2015 [id number], Jeneen Rinks, MD   CHIEF COMPLAINT:  Chest Pain  HPI: Peggy [name] is a 81 y.o. female with a Past Medical History of atrial fibrillation on chronic anticoagulation with Coumadin, prior history of CAD, CVA, gastroesophageal reflux disease who presents today with the above noted complaint. Per patient, she got back from church this afternoon and while attempting to take a nap she started experiencing retrosternal chest pain.She describes it as heaviness, lasting approximately around half an hour. There was no radiation. This was associated with palpitations. No associated nausea, vomiting, diaphoresis. Chest pain finally resolved after 3 rounds of sublingual nitroglycerin.She was brought to the emergency room, where she was found to have atrial fibrillation with rapid ventricular response. She was subsequently given IV Cardizem and IV metoprolol, she then spontaneously converted to sinus rhythm. ED MD spoke with cardiology on-call, referred the patient to the hospitalist service for overnight observation. During evaluation, patient was completely asymptomatic, all of her presenting symptoms have resolved. There is no history of fever, headache, nausea, vomiting, diarrhea or abdominal pain.  ALLERGIES:   Allergies  Allergen Reactions  . Alendronate Sodium     REACTION: questionable  . Amoxicillin-Pot Clavulanate   . Etodolac   . Ibandronate Sodium     REACTION: questionable  . Lactose Intolerance (Gi) Other (See Comments)    GI problems  . Minocycline Hcl   . Risedronate Sodium   . Sulfonamide Derivatives Other (See Comments)    Stomach pain  . Zoledronic Acid     REACTION: "dental problems"    PAST MEDICAL HISTORY: Past Medical History  Diagnosis Date  . Essential hypertension, benign   . Arthritis   . Heart disease 2001  . Hypertension 1996  .  Stroke 2009  . Heart attack 2001  . Unspecified essential hypertension   . Hearing disorder 2012    hearing aids  . Reflux     acid reflux  . Breast screening, unspecified   . Lump or mass in breast   . Special screening for malignant neoplasms, colon   . Other benign neoplasm of connective and other soft tissue of thorax   . Personal history of malignant neoplasm of breast   . Breast cancer 2012    Patient underwent a left mastectomy on 03-31-11. Pathology showed DCIS at both sites without an invasive component. Sentinel nodes were negative. The breast lesions were noted to be ER +40%, PR-positive, 5%.   . Malignant neoplasm of upper-inner quadrant of female breast   . Malignant neoplasm of upper-outer quadrant of female breast   . Atrial fibrillation   . Allergy     PAST SURGICAL HISTORY: Past Surgical History  Procedure Laterality Date  . Mastectomy  2012    left breast  . Breast biopsy      left breast biopsy over 15 years ago   . Upper gi endoscopy  2009    [facility] Dr. Donnella Sham  . Colonoscopy  2009    [facility] Dr. Donnella Sham  . Appendectomy      age 86  . Tonsillectomy      as a child  . Cholecystectomy      40 years ago   . Abdominal hysterectomy      age 18    MEDICATIONS AT HOME: Prior to Admission medications   Medication Sig Start Date End Date Taking?  Authorizing Provider  acetaminophen (TYLENOL) 325 MG tablet Take 325 mg by mouth as needed.    Historical Provider, MD  ALPRAZolam Duanne Moron) 0.25 MG tablet Take 0.25 mg by mouth at bedtime.     Historical Provider, MD  aspirin 81 MG tablet Take 81 mg by mouth daily.    Historical Provider, MD  CALCIUM PO Take by mouth.    Historical Provider, MD  DILTIAZEM HCL PO Take by mouth.    Historical Provider, MD  escitalopram (LEXAPRO) 10 MG tablet Take 10 mg by mouth daily. 1/4 in the am    Historical Provider, MD  Lansoprazole (PREVACID PO) Take by mouth daily.    Historical Provider, MD  Multiple Vitamin (MULTI-VITAMINS) TABS  Take by mouth.    Historical Provider, MD  warfarin (COUMADIN) 3 MG tablet Take 4.5 mg by mouth daily at 6 PM.     Historical Provider, MD    FAMILY HISTORY: Family History  Problem Relation Age of Onset  . Colon cancer Neg Hx   . Ovarian cancer Neg Hx   . Breast cancer Sister     x 2, in their 26's  . Lung cancer Brother   . Stroke Father   . Heart disease Father   . Heart disease Mother     SOCIAL HISTORY:  reports that she has never smoked. She has never used smokeless tobacco. She reports that she does not drink alcohol or use illicit drugs.  REVIEW OF SYSTEMS:  Constitutional:   No  weight loss, night sweats,  Fevers, chills, fatigue.  HEENT:    No headaches, Difficulty swallowing,Tooth/dental problems,Sore throat,   Cardio-vascular: No Orthopnea, PND, swelling in lower extremities  GI:  No heartburn, indigestion, abdominal pain, nausea, vomiting, diarrhea  Resp: No shortness of breath with exertion or at rest.  No excess mucus, no productive cough, No non-productive cough.  Skin:  no rash or lesions.  GU:  no dysuria, change in color of urine, no urgency or frequency.  No flank pain.  Musculoskeletal: No joint pain or swelling.  No decreased range of motion.  No back pain.  Psych: No change in mood or affect. No depression or anxiety.  No memory loss.   PHYSICAL EXAM: Blood pressure 127/69, pulse 70, temperature 98.3 F (36.8 C), temperature source Oral, resp. rate 26, height 4\' 8"  (1.422 m), weight 60.782 kg (134 lb), SpO2 96 %.  General appearance :Awake, alert, not in any distress. Speech Clear. Not toxic Looking HEENT: Atraumatic and Normocephalic, pupils equally reactive to light and accomodation Neck: supple, no JVD. No cervical lymphadenopathy.  Chest:Good air entry bilaterally, no added sounds  CVS: S1 S2 regular, no murmurs.  Abdomen: Bowel sounds present, Non tender and not distended with no gaurding, rigidity or rebound. Extremities: B/L  Lower Ext shows no edema, both legs are warm to touch Neurology: Awake alert, and oriented X 3, CN II-XII intact, Non focal Skin:No Rash Wounds:N/A  LABS ON ADMISSION:   Recent Labs  03/11/15 1410  NA 140  K 4.1  CL 105  CO2 25  GLUCOSE 100*  BUN 11  CREATININE 0.86  CALCIUM 9.2    Recent Labs  03/11/15 1410  AST 18  ALT 22  ALKPHOS 116  BILITOT 1.1  PROT 6.4  ALBUMIN 3.6    Recent Labs  03/11/15 1410  LIPASE 23    Recent Labs  03/11/15 1410  WBC 11.3*  NEUTROABS 6.9  HGB 13.7  HCT 42.0  MCV 100.5*  PLT 267   No results for input(s): CKTOTAL, CKMB, CKMBINDEX, TROPONINI in the last 72 hours. No results for input(s): DDIMER in the last 72 hours. Invalid input(s): POCBNP   RADIOLOGIC STUDIES ON ADMISSION: Dg Chest Port 1 View  03/11/2015   CLINICAL DATA:  Chest pain for 3 days.  EXAM: PORTABLE CHEST - 1 VIEW  COMPARISON:  None.  FINDINGS: The cardiomediastinal silhouette is unremarkable.  There is no evidence of focal airspace disease, pulmonary edema, suspicious pulmonary nodule/mass, pleural effusion, or pneumothorax. No acute bony abnormalities are identified.  IMPRESSION: No active disease.   Electronically Signed   By: Margarette Canada M.D.   On: 03/11/2015 15:08     EKG: Independently reviewed. First EKG-A. fib with RVR. Second EKG sinus rhythm  ASSESSMENT AND PLAN: Present on Admission:  . Chest pain: Patient with known coronary artery disease-had a MI approximately 13 years back, presenting with chest pain in a setting of A. fib with RVR. Suspect this may be related to demand ischemia in the setting of rapid A. fib. Admit to telemetry unit, cycle cardiac enzymes. Continue aspirin and beta blocker. If enzymes were to become elevated, will need formal cardiology evaluation.Patient normally follows up with cardiology in [place]   . Atrial fibrillation with RVR: Back in sinus rhythm. Briefly was in A. fib with RVR initially, given Cardizem and beta  blocker and subsequently spontaneously converted back to sinus rhythm. Currently maintaining sinus rhythm. Continue Cardizem and metoprolol. Coumadin to be dosed by pharmacy while inpatient. Monitor in telemetry.   Marland Kitchen GERD: Has some mild epigastric tenderness-continue PPI.  Marland Kitchen History of CVA: Nonfocal exam. Continue with Coumadin  Further plan will depend as patient's clinical course evolves and further radiologic and laboratory data become available. Patient will be monitored closely.  Above noted plan was discussed with patient/family, they were in agreement.   DVT Prophylaxis: On coumadin  Code Status: Full Code  Disposition Plan: Home likely tomorrow if work up negative    Total time spent for admission equals 45 minutes.  [facility] Pager [phone]  If 7PM-7AM, please contact night-coverage [email or web address] Password [email or web address] 03/11/2015, 4:07 PM

## 2015-03-11 NOTE — Consult Note (Signed)
ANTICOAGULATION CONSULT NOTE - Initial Consult  Pharmacy Consult for Coumadin Indication: atrial fibrillation  Allergies  Allergen Reactions  . Alendronate Sodium     REACTION: questionable  . Amoxicillin-Pot Clavulanate   . Etodolac   . Ibandronate Sodium     REACTION: questionable  . Lactose Intolerance (Gi) Other (See Comments)    GI problems  . Minocycline Hcl   . Risedronate Sodium   . Sulfonamide Derivatives Other (See Comments)    Stomach pain  . Zoledronic Acid     REACTION: "dental problems"    Patient Measurements: Height: 4\' 8"  (142.2 cm) Weight: 134 lb (60.782 kg) IBW/kg (Calculated) : 36.3  Vital Signs: Temp: 98.3 F (36.8 C) (03/20 1415) Temp Source: Oral (03/20 1415) BP: 126/65 mmHg (03/20 1700) Pulse Rate: 74 (03/20 1700)  Labs:  Recent Labs  03/11/15 1410  HGB 13.7  HCT 42.0  PLT 267  LABPROT 35.1*  INR 3.46*  CREATININE 0.86    Estimated Creatinine Clearance: 35.4 mL/min (by C-G formula based on Cr of 0.86).   Medical History: Past Medical History  Diagnosis Date  . Essential hypertension, benign   . Arthritis   . Heart disease 2001  . Hypertension 1996  . Stroke 2009  . Heart attack 2001  . Unspecified essential hypertension   . Hearing disorder 2012    hearing aids  . Reflux     acid reflux  . Breast screening, unspecified   . Lump or mass in breast   . Special screening for malignant neoplasms, colon   . Other benign neoplasm of connective and other soft tissue of thorax   . Personal history of malignant neoplasm of breast   . Breast cancer 2012    Patient underwent a left mastectomy on 03-31-11. Pathology showed DCIS at both sites without an invasive component. Sentinel nodes were negative. The breast lesions were noted to be ER +40%, PR-positive, 5%.   . Malignant neoplasm of upper-inner quadrant of female breast   . Malignant neoplasm of upper-outer quadrant of female breast   . Atrial fibrillation   . Allergy     Assessment: 86yof on coumadin pta for afib, admitted with chest pain. INR on admission is above goal at 3.46. Pharmacy asked to resume coumadin.  Goal of Therapy:  INR 2-3 Monitor platelets by anticoagulation protocol: Yes   Plan:  1) No coumadin tonight 2) Daily INR 3) Follow up med rec for home coumadin dose  Deboraha Sprang 03/11/2015,5:38 PM

## 2015-03-11 NOTE — ED Notes (Signed)
Patient comes Home from Home per EMS patient has had  Chest Pain x3  Days . EMS states patient  Had runs of  SVT with them. Patient took 3 Nitro at home and was given 324 ASA with EMS. Patient denied any SOB or n/v  Just complaints of dizziness.  Patient has a 24 R Hand placed by EMS. CBG 96 with EMS.

## 2015-03-12 ENCOUNTER — Encounter (HOSPITAL_COMMUNITY): Payer: Self-pay | Admitting: *Deleted

## 2015-03-12 DIAGNOSIS — D689 Coagulation defect, unspecified: Secondary | ICD-10-CM

## 2015-03-12 DIAGNOSIS — R079 Chest pain, unspecified: Secondary | ICD-10-CM | POA: Diagnosis not present

## 2015-03-12 DIAGNOSIS — I4891 Unspecified atrial fibrillation: Secondary | ICD-10-CM | POA: Diagnosis not present

## 2015-03-12 DIAGNOSIS — R072 Precordial pain: Secondary | ICD-10-CM | POA: Diagnosis not present

## 2015-03-12 LAB — TROPONIN I: Troponin I: 0.03 ng/mL (ref <0.031)

## 2015-03-12 LAB — PROTIME-INR
INR: 3.26 — ABNORMAL HIGH (ref 0.00–1.49)
Prothrombin Time: 33.5 seconds — ABNORMAL HIGH (ref 11.6–15.2)

## 2015-03-12 MED ORDER — WARFARIN SODIUM 1 MG PO TABS
1.0000 mg | ORAL_TABLET | Freq: Once | ORAL | Status: DC
  Filled 2015-03-12: qty 1

## 2015-03-12 MED ORDER — WARFARIN - PHARMACIST DOSING INPATIENT: Freq: Every day | Status: DC

## 2015-03-12 NOTE — Progress Notes (Signed)
ANTICOAGULATION CONSULT NOTE - Initial Consult  Pharmacy Consult for warfarin Indication: atrial fibrillation  Allergies  Allergen Reactions  . Alendronate Sodium     REACTION: questionable  . Amoxicillin-Pot Clavulanate   . Etodolac   . Ibandronate Sodium     REACTION: questionable  . Iodine     Other reaction(s): Unknown  . Lactose Intolerance (Gi) Other (See Comments)    GI problems  . Minocycline Hcl   . Risedronate Sodium   . Sulfonamide Derivatives Other (See Comments)    Stomach pain  . Zoledronic Acid     REACTION: "dental problems"    Patient Measurements: Height: 4\' 8"  (142.2 cm) Weight: 134 lb (60.782 kg) IBW/kg (Calculated) : 36.3 Heparin Dosing Weight:   Vital Signs: Temp: 98.3 F (36.8 C) (03/21 0524) Temp Source: Oral (03/21 0524) BP: 150/57 mmHg (03/21 0524) Pulse Rate: 71 (03/21 0524)  Labs:  Recent Labs  03/11/15 1410 03/11/15 1850 03/11/15 2105 03/12/15 03/12/15 0522  HGB 13.7  --   --   --   --   HCT 42.0  --   --   --   --   PLT 267  --   --   --   --   LABPROT 35.1*  --   --   --  33.5*  INR 3.46*  --   --   --  3.26*  CREATININE 0.86  --   --   --   --   TROPONINI  --  0.03 0.03 0.03  --     Estimated Creatinine Clearance: 35.4 mL/min (by C-G formula based on Cr of 0.86).   Medical History: Past Medical History  Diagnosis Date  . Essential hypertension, benign   . Arthritis   . Heart disease 2001  . Hypertension 1996  . Stroke 2009  . Heart attack 2001  . Unspecified essential hypertension   . Hearing disorder 2012    hearing aids  . Reflux     acid reflux  . Breast screening, unspecified   . Lump or mass in breast   . Special screening for malignant neoplasms, colon   . Other benign neoplasm of connective and other soft tissue of thorax   . Personal history of malignant neoplasm of breast   . Breast cancer 2012    Patient underwent a left mastectomy on 03-31-11. Pathology showed DCIS at both sites without an invasive  component. Sentinel nodes were negative. The breast lesions were noted to be ER +40%, PR-positive, 5%.   . Malignant neoplasm of upper-inner quadrant of female breast   . Malignant neoplasm of upper-outer quadrant of female breast   . Atrial fibrillation   . Allergy     Medications:  See EMR  Assessment: 81 yo female on warfarin PTA for AFib. PTA dose: 3 mg po daily. INR suprtherapeutic, but down slightly from admission 3.4 > 3.2. CBC stable and no s/sx bleeding.  Goal of Therapy:  INR 2-3 Monitor platelets by anticoagulation protocol: Yes   Plan:  -Warfarin 1 mg po x1 -Daily INR -Monitor for s/sx bleeding    Hughes Better, PharmD, BCPS Clinical Pharmacist Pager: [phone] 03/12/2015 2:49 PM

## 2015-03-12 NOTE — Discharge Instructions (Signed)
TONIGHT ONLY: Take 1.5mg  (half of your regular Coumadin dose)   Cardiac Diet This diet can help prevent heart disease and stroke. Many factors influence your heart health, including eating and exercise habits. Coronary risk rises a lot with abnormal blood fat (lipid) levels. Cardiac meal planning includes limiting unhealthy fats, increasing healthy fats, and making other small dietary changes. General guidelines are as follows:  Adjust calorie intake to reach and maintain desirable body weight.  Limit total fat intake to less than 30% of total calories. Saturated fat should be less than 7% of calories.  Saturated fats are found in animal products and in some vegetable products. Saturated vegetable fats are found in coconut oil, cocoa butter, palm oil, and palm kernel oil. Read labels carefully to avoid these products as much as possible. Use butter in moderation. Choose tub margarines and oils that have 2 grams of fat or less. Good cooking oils are canola and olive oils.  Practice low-fat cooking techniques. Do not fry food. Instead, broil, bake, boil, steam, grill, roast on a rack, stir-fry, or microwave it. Other fat reducing suggestions include:  Remove the skin from poultry.  Remove all visible fat from meats.  Skim the fat off stews, soups, and gravies before serving them.  Steam vegetables in water or broth instead of sauting them in fat.  Avoid foods with trans fat (or hydrogenated oils), such as commercially fried foods and commercially baked goods. Commercial shortening and deep-frying fats will contain trans fat.  Increase intake of fruits, vegetables, whole grains, and legumes to replace foods high in fat.  Increase consumption of nuts, legumes, and seeds to at least 4 servings weekly. One serving of a legume equals  cup, and 1 serving of nuts or seeds equals  cup.  Choose whole grains more often. Have 3 servings per day (a serving is 1 ounce [oz]).  Eat 4 to 5 servings of  vegetables per day. A serving of vegetables is 1 cup of raw leafy vegetables;  cup of raw or cooked cut-up vegetables;  cup of vegetable juice.  Eat 4 to 5 servings of fruit per day. A serving of fruit is 1 medium whole fruit;  cup of dried fruit;  cup of fresh, frozen, or canned fruit;  cup of 100% fruit juice.  Increase your intake of dietary fiber to 20 to 30 grams per day. Insoluble fiber may help lower your risk of heart disease and may help curb your appetite.  Soluble fiber binds cholesterol to be removed from the blood. Foods high in soluble fiber are dried beans, citrus fruits, oats, apples, bananas, broccoli, Brussels sprouts, and eggplant.  Try to include foods fortified with plant sterols or stanols, such as yogurt, breads, juices, or margarines. Choose several fortified foods to achieve a daily intake of 2 to 3 grams of plant sterols or stanols.  Foods with omega-3 fats can help reduce your risk of heart disease. Aim to have a 3.5 oz portion of fatty fish twice per week, such as salmon, mackerel, albacore tuna, sardines, lake trout, or herring. If you wish to take a fish oil supplement, choose one that contains 1 gram of both DHA and EPA.  Limit processed meats to 2 servings (3 oz portion) weekly.  Limit the sodium in your diet to 1500 milligrams (mg) per day. If you have high blood pressure, talk to a registered dietitian about a DASH (Dietary Approaches to Stop Hypertension) eating plan.  Limit sweets and beverages with added sugar,  such as soda, to no more than 5 servings per week. One serving is:   1 tablespoon sugar.  1 tablespoon jelly or jam.   cup sorbet.  1 cup lemonade.   cup regular soda. CHOOSING FOODS Starches  Allowed: Breads: All kinds (wheat, rye, raisin, white, oatmeal, [place], [place], and English muffin bread). Low-fat rolls: English muffins, frankfurter and hamburger buns, bagels, pita bread, tortillas (not fried). Pancakes, waffles, biscuits, and  muffins made with recommended oil.  Avoid: Products made with saturated or trans fats, oils, or whole milk products. Butter rolls, cheese breads, croissants. Commercial doughnuts, muffins, sweet rolls, biscuits, waffles, pancakes, store-bought mixes. Crackers  Allowed: Low-fat crackers and snacks: Animal, graham, rye, saltine (with recommended oil, no lard), oyster, and matzo crackers. Bread sticks, melba toast, rusks, flatbread, pretzels, and light popcorn.  Avoid: High-fat crackers: cheese crackers, butter crackers, and those made with coconut, palm oil, or trans fat (hydrogenated oils). Buttered popcorn. Cereals  Allowed: Hot or cold whole-grain cereals.  Avoid: Cereals containing coconut, hydrogenated vegetable fat, or animal fat. Potatoes / Pasta / Rice  Allowed: All kinds of potatoes, rice, and pasta (such as macaroni, spaghetti, and noodles).  Avoid: Pasta or rice prepared with cream sauce or high-fat cheese. Chow mein noodles, [place] fries. Vegetables  Allowed: All vegetables and vegetable juices.  Avoid: Fried vegetables. Vegetables in cream, butter, or high-fat cheese sauces. Limit coconut. Fruit in cream or custard. Protein  Allowed: Limit your intake of meat, seafood, and poultry to no more than 6 oz (cooked weight) per day. All lean, well-trimmed beef, veal, pork, and lamb. All chicken and [place] without skin. All fish and shellfish. Wild game: wild duck, rabbit, pheasant, and venison. Egg whites or low-cholesterol egg substitutes may be used as desired. Meatless dishes: recipes with dried beans, peas, lentils, and tofu (soybean curd). Seeds and nuts: all seeds and most nuts.  Avoid: Prime grade and other heavily marbled and fatty meats, such as short ribs, spare ribs, rib eye roast or steak, frankfurters, sausage, bacon, and high-fat luncheon meats, mutton. Caviar. Commercially fried fish. Domestic duck, goose, venison sausage. Organ meats: liver, gizzard, heart,  chitterlings, brains, kidney, sweetbreads. Dairy  Allowed: Low-fat cheeses: nonfat or low-fat cottage cheese (1% or 2% fat), cheeses made with part skim milk, such as mozzarella, farmers, string, or ricotta. (Cheeses should be labeled no more than 2 to 6 grams fat per oz.). Skim (or 1%) milk: liquid, powdered, or evaporated. Buttermilk made with low-fat milk. Drinks made with skim or low-fat milk or cocoa. Chocolate milk or cocoa made with skim or low-fat (1%) milk. Nonfat or low-fat yogurt.  Avoid: Whole milk cheeses, including colby, cheddar, [place], Monterey Jack, [place], [place], [place], American, Swiss, and blue. Creamed cottage cheese, cream cheese. Whole milk and whole milk products, including buttermilk or yogurt made from whole milk, drinks made from whole milk. Condensed milk, evaporated whole milk, and 2% milk. Soups and Combination Foods  Allowed: Low-fat low-sodium soups: broth, dehydrated soups, homemade broth, soups with the fat removed, homemade cream soups made with skim or low-fat milk. Low-fat spaghetti, lasagna, chili, and Spanish rice if low-fat ingredients and low-fat cooking techniques are used.  Avoid: Cream soups made with whole milk, cream, or high-fat cheese. All other soups. Desserts and Sweets  Allowed: Sherbet, fruit ices, gelatins, meringues, and angel food cake. Homemade desserts with recommended fats, oils, and milk products. Jam, jelly, honey, marmalade, sugars, and syrups. Pure sugar candy, such as gum drops, hard candy, jelly beans,  marshmallows, mints, and small amounts of dark chocolate.  Avoid: Commercially prepared cakes, pies, cookies, frosting, pudding, or mixes for these products. Desserts containing whole milk products, chocolate, coconut, lard, palm oil, or palm kernel oil. Ice cream or ice cream drinks. Candy that contains chocolate, coconut, butter, hydrogenated fat, or unknown ingredients. Buttered syrups. Fats and Oils  Allowed: Vegetable oils:  safflower, sunflower, corn, soybean, cottonseed, sesame, canola, olive, or peanut. Non-hydrogenated margarines. Salad dressing or mayonnaise: homemade or commercial, made with a recommended oil. Low or nonfat salad dressing or mayonnaise.  Limit added fats and oils to 6 to 8 tsp per day (includes fats used in cooking, baking, salads, and spreads on bread). Remember to count the "hidden fats" in foods.  Avoid: Solid fats and shortenings: butter, lard, salt pork, bacon drippings. Gravy containing meat fat, shortening, or suet. Cocoa butter, coconut. Coconut oil, palm oil, palm kernel oil, or hydrogenated oils: these ingredients are often used in bakery products, nondairy creamers, whipped toppings, candy, and commercially fried foods. Read labels carefully. Salad dressings made of unknown oils, sour cream, or cheese, such as blue cheese and Roquefort. Cream, all kinds: half-and-half, light, heavy, or whipping. Sour cream or cream cheese (even if "light" or low-fat). Nondairy cream substitutes: coffee creamers and sour cream substitutes made with palm, palm kernel, hydrogenated oils, or coconut oil. Beverages  Allowed: Coffee (regular or decaffeinated), tea. Diet carbonated beverages, mineral water. Alcohol: Check with your caregiver. Moderation is recommended.  Avoid: Whole milk, regular sodas, and juice drinks with added sugar. Condiments  Allowed: All seasonings and condiments. Cocoa powder. "Cream" sauces made with recommended ingredients.  Avoid: Carob powder made with hydrogenated fats. SAMPLE MENU Breakfast   cup orange juice   cup oatmeal  1 slice toast  1 tsp margarine  1 cup skim milk Lunch  [place] sandwich with 2 oz [place], 2 slices bread  Lettuce and tomato slices  Fresh fruit  Carrot sticks  Coffee or tea Snack  Fresh fruit or low-fat crackers Dinner  3 oz lean ground beef  1 baked potato  1 tsp margarine   cup asparagus  Lettuce salad  1 tbs  non-creamy dressing   cup peach slices  1 cup skim milk Document Released: 09/16/2008 Document Revised: 06/08/2012 Document Reviewed: 02/07/2014 Exit[employer] Patient Information 2015 [place], [place]. This information is not intended to replace advice given to you by your health care provider. Make sure you discuss any questions you have with your health care provider.

## 2015-03-12 NOTE — Progress Notes (Signed)
UR completed 

## 2015-03-12 NOTE — Discharge Summary (Signed)
Discharge Summary  Peggy [name] NGE:[id number] DOB: 02-11-1928  PCP: Maryland Pink, MD  Admit date: 03/11/2015 Discharge date: 03/12/2015  Time spent: 25 minutes  Recommendations for Outpatient Follow-up:  1. Patient will follow-up with her cardiologist for her Coumadin level already scheduled in the next few days  2. Patient will take half of her 3 mg Coumadin dose at 1.5 mg tonight only and then resume normal 3 mg daily at bedtime starting tomorrow  Discharge Diagnoses:  Active Hospital Problems   Diagnosis Date Noted  . Chest pain 03/11/2015  . Atrial fibrillation with RVR 03/11/2015  . H/O: CVA (cerebrovascular accident) 03/11/2015  . EPIGASTRIC PAIN 02/14/2009    Resolved Hospital Problems   Diagnosis Date Noted Date Resolved  No resolved problems to display.    Discharge Condition: improved, being discharged home  Diet recommendation: heart healthy  Filed Weights   03/11/15 1418  Weight: 60.782 kg (134 lb)    History of present illness:  81 year old female past oral history of atrial for relation chronic Coumadin presented with complaints of vague retrosternal chest pain both heaviness and sharp with no radiation on the afternoon of 3/20. Brought into the emergency room and chest pain resolved after nitroglycerin. She is found to have itch or fibrillation with rapid ventricular response she's had previously. Initially requiring Cardizem and metoprolol IV she converted back to sinus rhythm. She was brought in overnight for observation.INR on admission was 3.46  Hospital Course:  Principal Problem:   Chest pain: Troponin 3 normal.EKG unrevealing. No further arrhythmia events. Recommendation is for patient to follow-up with her cardiologist which she has an appointment scheduled later this week Active Problems:   EPIGASTRIC PAIN   Atrial fibrillation with RVR: INR remained slightly supratherapeutic even after holding 3/20 evening dose. It was 3.26 this morning.Rate  remained controlled and currently in sinus rhythm. Advised patient to take half of her dose,1.5 mg tonight   H/O: CVA (cerebrovascular accident): Stable   Procedures:  none  Consultations:  none  Discharge Exam: BP 150/57 mmHg  Pulse 71  Temp(Src) 98.3 F (36.8 C) (Oral)  Resp 16  Ht 4\' 8"  (1.422 m)  Wt 60.782 kg (134 lb)  BMI 30.06 kg/m2  SpO2 98%  General: alert and oriented 2, no acute distress Cardiovascular: regular rate and rhythm, S1-S2 Respiratory: clear to auscultation bilaterally  Discharge Instructions You were cared for by a hospitalist during your hospital stay. If you have any questions about your discharge medications or the care you received while you were in the hospital after you are discharged, you can call the unit and asked to speak with the hospitalist on call if the hospitalist that took care of you is not available. Once you are discharged, your primary care physician will handle any further medical issues. Please note that NO REFILLS for any discharge medications will be authorized once you are discharged, as it is imperative that you return to your primary care physician (or establish a relationship with a primary care physician if you do not have one) for your aftercare needs so that they can reassess your need for medications and monitor your lab values.  Discharge Instructions    Diet - low sodium heart healthy    Complete by:  As directed      Increase activity slowly    Complete by:  As directed             Medication List    TAKE these medications  acetaminophen 325 MG tablet  Commonly known as:  TYLENOL  Take 325 mg by mouth every 6 (six) hours as needed for mild pain.     ALPRAZolam 0.25 MG tablet  Commonly known as:  XANAX  Take 0.25 mg by mouth at bedtime as needed for anxiety. For sleep     aspirin 81 MG tablet  Take 81 mg by mouth daily.     CALCIUM PO  Take 1 tablet by mouth daily.     diazepam 2 MG tablet    Commonly known as:  VALIUM  Take 2-4 mg by mouth 3 (three) times daily as needed for anxiety. For muscle spasm.     diltiazem 240 MG 24 hr capsule  Commonly known as:  CARDIZEM CD  Take 240 mg by mouth daily.     escitalopram 10 MG tablet  Commonly known as:  LEXAPRO  Take 10 mg by mouth daily.     metoprolol tartrate 25 MG tablet  Commonly known as:  LOPRESSOR  Take 25 mg by mouth 2 (two) times daily.     MULTI-VITAMINS Tabs  Take 1 tablet by mouth daily.     nitroGLYCERIN 0.4 MG SL tablet  Commonly known as:  NITROSTAT  Place 0.4 mg under the tongue every 5 (five) minutes as needed for chest pain. Every 5 minutes as needed     PREVACID PO  Take 1 tablet by mouth daily.     tiZANidine 2 MG tablet  Commonly known as:  ZANAFLEX  Take 1 mg by mouth 3 (three) times daily as needed for muscle spasms.     traMADol 50 MG tablet  Commonly known as:  ULTRAM  Take 25 mg by mouth 2 (two) times daily as needed for moderate pain.     warfarin 3 MG tablet  Commonly known as:  COUMADIN  Take 3 mg by mouth daily.       Allergies  Allergen Reactions  . Alendronate Sodium     REACTION: questionable  . Amoxicillin-Pot Clavulanate   . Etodolac   . Ibandronate Sodium     REACTION: questionable  . Iodine     Other reaction(s): Unknown  . Lactose Intolerance (Gi) Other (See Comments)    GI problems  . Minocycline Hcl   . Risedronate Sodium   . Sulfonamide Derivatives Other (See Comments)    Stomach pain  . Zoledronic Acid     REACTION: "dental problems"       Follow-up Information    Follow up with Teodoro Spray., MD.   Specialty:  Cardiology   Why:  As scheduled this week to check coumadin level   Contact information:   [place] [address] [phone]       Follow up with Maryland Pink, MD In 1 month.   Specialty:  Family Medicine   Contact information:   [address] [place] [address] [phone]         The results of significant diagnostics from this hospitalization (including imaging, microbiology, ancillary and laboratory) are listed below for reference.    Significant Diagnostic Studies: Dg Chest Port 1 View  03/11/2015   CLINICAL DATA:  Chest pain for 3 days.  EXAM: PORTABLE CHEST - 1 VIEW  COMPARISON:  None.  FINDINGS: The cardiomediastinal silhouette is unremarkable.  There is no evidence of focal airspace disease, pulmonary edema, suspicious pulmonary nodule/mass, pleural effusion, or pneumothorax. No acute bony abnormalities are identified.  IMPRESSION:  No active disease.   Electronically Signed   By: Margarette Canada M.D.   On: 03/11/2015 15:08    Microbiology: No results found for this or any previous visit (from the past 240 hour(s)).   Labs: Basic Metabolic Panel:  Recent Labs Lab 03/11/15 1410  NA 140  K 4.1  CL 105  CO2 25  GLUCOSE 100*  BUN 11  CREATININE 0.86  CALCIUM 9.2   Liver Function Tests:  Recent Labs Lab 03/11/15 1410  AST 18  ALT 22  ALKPHOS 116  BILITOT 1.1  PROT 6.4  ALBUMIN 3.6    Recent Labs Lab 03/11/15 1410  LIPASE 23   No results for input(s): AMMONIA in the last 168 hours. CBC:  Recent Labs Lab 03/11/15 1410  WBC 11.3*  NEUTROABS 6.9  HGB 13.7  HCT 42.0  MCV 100.5*  PLT 267   Cardiac Enzymes:  Recent Labs Lab 03/11/15 1850 03/11/15 2105 03/12/15  TROPONINI 0.03 0.03 0.03   BNP: BNP (last 3 results)  Recent Labs  03/11/15 1410  BNP 382.2*    ProBNP (last 3 results) No results for input(s): PROBNP in the last 8760 hours.  CBG: No results for input(s): GLUCAP in the last 168 hours.     Signed:  Annita Brod  [facility] 03/12/2015, 6:33 PM

## 2015-03-13 ENCOUNTER — Encounter: Payer: Self-pay | Admitting: General Surgery

## 2015-03-13 ENCOUNTER — Ambulatory Visit: Payer: Medicare Other | Admitting: General Surgery

## 2015-03-13 ENCOUNTER — Ambulatory Visit (INDEPENDENT_AMBULATORY_CARE_PROVIDER_SITE_OTHER): Payer: Medicare Other | Admitting: General Surgery

## 2015-03-13 DIAGNOSIS — Z853 Personal history of malignant neoplasm of breast: Secondary | ICD-10-CM

## 2015-03-13 NOTE — Progress Notes (Signed)
Patient ID: Peggy [name], female   DOB: September 06, 1928, 81 y.o.   MRN: [id number]  Chief Complaint  Patient presents with  . Follow-up    mammogram    HPI Peggy [name] is a 81 y.o. female.  who presents for follow up breast cancer evaluation. The most recent right mammogram was done on 03-02-15.  Patient does perform regular self breast checks and gets regular mammograms done.  She states she was in the hospital over night for a fib on 03-11-15 to 03-12-15. No new breast issues.  She has fallen about 3 times over the past 2 months. She has an appointment with [facility] next week.  HPI  Past Medical History  Diagnosis Date  . Essential hypertension, benign   . Arthritis   . Heart disease 2001  . Hypertension 1996  . Stroke 2009  . Heart attack 2001  . Unspecified essential hypertension   . Hearing disorder 2012    hearing aids  . Reflux     acid reflux  . Breast screening, unspecified   . Lump or mass in breast   . Special screening for malignant neoplasms, colon   . Other benign neoplasm of connective and other soft tissue of thorax   . Personal history of malignant neoplasm of breast   . Breast cancer 2012    Patient underwent a left mastectomy on 03-31-11. Pathology showed DCIS at both sites without an invasive component. Sentinel nodes were negative. The breast lesions were noted to be ER +40%, PR-positive, 5%.   . Malignant neoplasm of upper-inner quadrant of female breast   . Malignant neoplasm of upper-outer quadrant of female breast   . Atrial fibrillation   . Allergy   . Atrial fibrillation   . Atrial fibrillation with rapid ventricular response November 2015    Heart rate up to 140 requiring IV Cardizem    Past Surgical History  Procedure Laterality Date  . Mastectomy  2012    left breast  . Breast biopsy      left breast biopsy over 15 years ago   . Upper gi endoscopy  2009    [facility] Dr. Donnella Sham  . Colonoscopy  2009    [facility] Dr. Donnella Sham  .  Appendectomy      age 66  . Tonsillectomy      as a child  . Cholecystectomy      40 years ago   . Abdominal hysterectomy      age 50  . Small intestine surgery  12/31/2012    Abdominal exploration, lysis of adhesions for distal small bowel obstruction. Rochel Brome, MD    Family History  Problem Relation Age of Onset  . Colon cancer Neg Hx   . Ovarian cancer Neg Hx   . Breast cancer Sister     x 2, in their 34's  . Lung cancer Brother   . Stroke Father   . Heart disease Father   . Heart disease Mother     Social History History  Substance Use Topics  . Smoking status: Never Smoker   . Smokeless tobacco: Never Used  . Alcohol Use: No    Allergies  Allergen Reactions  . Alendronate Sodium     REACTION: questionable  . Amoxicillin-Pot Clavulanate   . Etodolac   . Ibandronate Sodium     REACTION: questionable  . Iodine     Other reaction(s): Unknown  . Lactose Intolerance (Gi) Other (See Comments)    GI problems  .  Minocycline Hcl   . Risedronate Sodium   . Sulfonamide Derivatives Other (See Comments)    Stomach pain  . Zoledronic Acid     REACTION: "dental problems"    Current Outpatient Prescriptions  Medication Sig Dispense Refill  . acetaminophen (TYLENOL) 325 MG tablet Take 325 mg by mouth every 6 (six) hours as needed for mild pain.     Marland Kitchen ALPRAZolam (XANAX) 0.25 MG tablet Take 0.25 mg by mouth at bedtime as needed for anxiety. For sleep    . aspirin 81 MG tablet Take 81 mg by mouth daily.    Marland Kitchen CALCIUM PO Take 1 tablet by mouth daily.     Marland Kitchen diltiazem (CARDIZEM CD) 240 MG 24 hr capsule Take 240 mg by mouth daily.    Marland Kitchen escitalopram (LEXAPRO) 10 MG tablet Take 10 mg by mouth daily.    . Lansoprazole (PREVACID PO) Take 1 tablet by mouth daily.     . metoprolol tartrate (LOPRESSOR) 25 MG tablet Take 25 mg by mouth 2 (two) times daily.    . Multiple Vitamin (MULTI-VITAMINS) TABS Take 1 tablet by mouth daily.     . nitroGLYCERIN (NITROSTAT) 0.4 MG SL tablet  Place 0.4 mg under the tongue every 5 (five) minutes as needed for chest pain. Every 5 minutes as needed    . tiZANidine (ZANAFLEX) 2 MG tablet Take 1 mg by mouth 3 (three) times daily as needed for muscle spasms.    . traMADol (ULTRAM) 50 MG tablet Take 25 mg by mouth 2 (two) times daily as needed for moderate pain.     Marland Kitchen warfarin (COUMADIN) 3 MG tablet Take 3 mg by mouth daily.    . diazepam (VALIUM) 2 MG tablet   0   No current facility-administered medications for this visit.    Review of Systems Review of Systems  Constitutional: Negative.   Respiratory: Negative.   Cardiovascular: Negative.     Blood pressure 102/62, pulse 68, resp. rate 16, height 4\' 8"  (1.422 m), weight 129 lb (58.514 kg).  Physical Exam Physical Exam  Constitutional: She is oriented to person, place, and time. She appears well-developed and well-nourished.  Neck: Neck supple.  Cardiovascular: Normal rate, regular rhythm and normal heart sounds.   Pulmonary/Chest: Effort normal and breath sounds normal. Right breast exhibits no inverted nipple, no mass, no nipple discharge, no skin change and no tenderness.  Left chest wall incision remains well healed, no  swelling.  Musculoskeletal:  Tenderness right latissimus muscle.  Lymphadenopathy:    She has no cervical adenopathy.  Neurological: She is alert and oriented to person, place, and time.  Skin: Skin is warm and dry.    Data Reviewed Right breast mammogram dated 03/02/2015 completed at [facility] showed no interval change. BI-RADS-2. Records from the November 2015 hospitalization were removed available for review. The March 20-21, 2016 admission in [place] showed that she presented with an INR of 3.46. She required Cardizem and metoprolol IV, and reportedly was discharged in sinus rhythm.  Assessment    Benign breast exam.  Recent history of falls on oral Coumadin therapy.    Plan    The patient was accompanied today by her daughter, and  does confirm that she has appointment with urology next week. The need to have a careful discussion regarding the ongoing use of anticoagulation in a patient with a history of falls. Considering her past episodes of atrial fibrillation with rapid ventricular response, it certainly possible she is having transient episodes of  dysrhythmia.    Follow up in one year with right mammogram and office visit.   PCP:  Virgie Dad 03/13/2015, 9:49 PM

## 2015-03-13 NOTE — Patient Instructions (Signed)
Continue self breast exams. Call office for any new breast issues or concerns. 

## 2015-03-21 ENCOUNTER — Ambulatory Visit (INDEPENDENT_AMBULATORY_CARE_PROVIDER_SITE_OTHER): Payer: Medicare Other | Admitting: Nurse Practitioner

## 2015-03-21 ENCOUNTER — Encounter: Payer: Self-pay | Admitting: Nurse Practitioner

## 2015-03-21 VITALS — BP 136/84 | HR 97 | Temp 98.4°F | Resp 16 | Ht <= 58 in | Wt 128.0 lb

## 2015-03-21 DIAGNOSIS — I6389 Other cerebral infarction: Secondary | ICD-10-CM

## 2015-03-21 DIAGNOSIS — Z8673 Personal history of transient ischemic attack (TIA), and cerebral infarction without residual deficits: Secondary | ICD-10-CM | POA: Diagnosis not present

## 2015-03-21 DIAGNOSIS — I6350 Cerebral infarction due to unspecified occlusion or stenosis of unspecified cerebral artery: Secondary | ICD-10-CM | POA: Diagnosis not present

## 2015-03-21 DIAGNOSIS — I4891 Unspecified atrial fibrillation: Secondary | ICD-10-CM | POA: Diagnosis not present

## 2015-03-21 DIAGNOSIS — R413 Other amnesia: Secondary | ICD-10-CM | POA: Diagnosis not present

## 2015-03-21 DIAGNOSIS — R269 Unspecified abnormalities of gait and mobility: Secondary | ICD-10-CM

## 2015-03-21 NOTE — Progress Notes (Signed)
[facility]  PATIENT: Peggy [name] DOB: December 27, 1927   REASON FOR VISIT: follow up for history of stroke, atrial fib, memory loss, increased falls HISTORY FROM: Patient  and daughter  HISTORY OF PRESENT ILLNESS:[name], 81 year old female returns for followup. She was last seen 10/10/14. She has a history of stroke but returns today denying further stroke or TIA symptoms. Carotid Doppler negative for stenosis in September 2014. She has not had further episodes of blurred vision. She was admitted to the hospital 03/11/2015 for atrial fibrillation. She spent one day. There were no changes in her medication She has been having some hip and knee pain and she recently received epidural . She has arthritis.She reports 3 falls since last seen. She has minimal bruising from her warfarin. She continues to live alone and is fully independent in all activities of daily living. She continues to walk for exercise. She is driving without difficulty. She has short-term memory loss. 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 Vascular risk factors of age, sex, hyperlipidemia, hypertension and atrial fibrillation     REVIEW OF SYSTEMS: Full 14 system review of systems performed and notable only for those listed, all others are neg:  Constitutional: neg  Cardiovascular: neg Ear/Nose/Throat: Hearing loss Skin: neg Eyes: neg Respiratory: neg Gastroitestinal: Constipation, urinary frequency  Hematology/Lymphatic: Minimal bruising Endocrine: neg Musculoskeletal: Joint pain, back pain, muscle spasms, falls Allergy/Immunology: neg Neurological: Memory loss Psychiatric: neg Sleep : neg   ALLERGIES: Allergies  Allergen Reactions  . Alendronate Sodium     REACTION: questionable  . Amoxicillin-Pot Clavulanate   . Etodolac   . Ibandronate Sodium     REACTION:  questionable  . Iodine     Other reaction(s): Unknown  . Lactose Intolerance (Gi) Other (See Comments)    GI problems  . Minocycline Hcl   . Risedronate Sodium   . Sulfonamide Derivatives Other (See Comments)    Stomach pain  . Zoledronic Acid     REACTION: "dental problems"    HOME MEDICATIONS: Outpatient Prescriptions Prior to Visit  Medication Sig Dispense Refill  . acetaminophen (TYLENOL) 325 MG tablet Take 325 mg by mouth every 6 (six) hours as needed for mild pain.     Marland Kitchen ALPRAZolam (XANAX) 0.25 MG tablet Take 0.25 mg by mouth at bedtime as needed for anxiety. For sleep    . aspirin 81 MG tablet Take 81 mg by mouth daily.    Marland Kitchen CALCIUM PO Take 1 tablet by mouth daily.     . diazepam (VALIUM) 2 MG tablet   0  . diltiazem (CARDIZEM CD) 240 MG 24 hr capsule Take 240 mg by mouth daily.    Marland Kitchen escitalopram (LEXAPRO) 10 MG tablet Take 10 mg by mouth daily.    . Lansoprazole (PREVACID PO) Take 1 tablet by mouth daily.     . metoprolol tartrate (LOPRESSOR) 25 MG tablet Take 25 mg by mouth 2 (two) times daily.    . Multiple Vitamin (MULTI-VITAMINS) TABS Take 1 tablet by mouth daily.     . nitroGLYCERIN (NITROSTAT) 0.4 MG SL tablet Place 0.4 mg under the tongue every 5 (five) minutes as needed for chest pain. Every 5 minutes as needed    . tiZANidine (ZANAFLEX) 2 MG tablet Take 1 mg by mouth 3 (three) times daily as needed for muscle spasms.    . traMADol (ULTRAM) 50 MG tablet Take  25 mg by mouth 2 (two) times daily as needed for moderate pain.     Marland Kitchen warfarin (COUMADIN) 3 MG tablet Take 3 mg by mouth daily.     No facility-administered medications prior to visit.    PAST MEDICAL HISTORY: Past Medical History  Diagnosis Date  . Essential hypertension, benign   . Arthritis   . Heart disease 2001  . Hypertension 1996  . Stroke 2009  . Heart attack 2001  . Unspecified essential hypertension   . Hearing disorder 2012    hearing aids  . Reflux     acid reflux  . Breast screening,  unspecified   . Lump or mass in breast   . Special screening for malignant neoplasms, colon   . Other benign neoplasm of connective and other soft tissue of thorax   . Personal history of malignant neoplasm of breast   . Atrial fibrillation   . Allergy   . Atrial fibrillation   . Atrial fibrillation with rapid ventricular response November 2015    Heart rate up to 140 requiring IV Cardizem  . Breast cancer 2012    Patient underwent a left mastectomy on 03-31-11. Pathology showed DCIS at both sites without an invasive component. Sentinel nodes were negative. The breast lesions were noted to be ER +40%, PR-positive, 5%.   . Malignant neoplasm of upper-inner quadrant of female breast   . Malignant neoplasm of upper-outer quadrant of female breast     PAST SURGICAL HISTORY: Past Surgical History  Procedure Laterality Date  . Mastectomy  2012    left breast  . Breast biopsy      left breast biopsy over 15 years ago   . Upper gi endoscopy  2009    [facility] Dr. Donnella Sham  . Colonoscopy  2009    [facility] Dr. Donnella Sham  . Appendectomy      age 43  . Tonsillectomy      as a child  . Cholecystectomy      40 years ago   . Abdominal hysterectomy      age 64  . Small intestine surgery  12/31/2012    Abdominal exploration, lysis of adhesions for distal small bowel obstruction. Rochel Brome, MD    FAMILY HISTORY: Family History  Problem Relation Age of Onset  . Colon cancer Neg Hx   . Ovarian cancer Neg Hx   . Breast cancer Sister     x 2, in their 84's  . Lung cancer Brother   . Stroke Father   . Heart disease Father   . Heart disease Mother     SOCIAL HISTORY: History   Social History  . Marital Status: Widowed    Spouse Name: N/A  . Number of Children: 2  . Years of Education: 8   Occupational History  . Retired    Social History Main Topics  . Smoking status: Never Smoker   . Smokeless tobacco: Never Used  . Alcohol Use: No  . Drug Use: No  . Sexual Activity: Not on file    Other Topics Concern  . Not on file   Social History Narrative   Patient lives at home alone.    Patient has 2 children.    Patient has a 8th education.    Patient is widowed.    Patient is right handed.    Patient is retired.      PHYSICAL EXAM  Filed Vitals:   03/21/15 0912  BP: 152/92  Pulse: 97  Temp: 98.4 F (36.9 C)  TempSrc: Oral  Resp: 16  Height: 4\' 8"  (1.422 m)  Weight: 128 lb (58.06 kg)   Body mass index is 28.71 kg/(m^2). Generalized: Well developed, in no acute distress  Head: normocephalic and atraumatic,. Oropharynx benign  Neck: Supple, no carotid bruits  Cardiac: Regular rate rhythm, no murmur  Musculoskeletal: Arthritic distal fingers flexion deformity of hands  Skin minimal bruising  Neurological examination  Mentation: Alert oriented to time, place, history taking. MMSE 27/30 missing one item in orientation and short-term recall and unable to copy a figure . AFT 6. Clock drawing 4/4.Marland KitchenFollows all commands speech and language fluent  Cranial nerve II-XII: Fundoscopic exam not done.Pupils were equal round reactive to light extraocular movements were full, visual field were full on confrontational test. Facial sensation and strength were normal. hearing was intact to finger rubbing bilaterally. Uvula tongue midline. head turning and shoulder shrug were normal and symmetric.Tongue protrusion into cheek strength was normal.  Motor: normal bulk and tone, full strength in the BUE, BLE, No focal weakness  Sensory: normal and symmetric to light touch, pinprick, and vibration  Coordination: finger-nose-finger, heel-to-shin bilaterally, no dysmetria  Reflexes: 1+ upper lower and symmetric, plantar responses were flexor bilaterally.  Gait and Station: Rising up from seated position without assistance, normal stance, moderate stride, good arm swing, smooth turning, able to perform tiptoe, and heel walking without difficulty. Tandem gait is mildly  unsteady. No assistive device   LABS  Lab Results  Component Value Date   WBC 11.3* 03/11/2015   HGB 13.7 03/11/2015   HCT 42.0 03/11/2015   MCV 100.5* 03/11/2015   PLT 267 03/11/2015      Component Value Date/Time   NA 140 03/11/2015 1410   K 4.1 03/11/2015 1410   CL 105 03/11/2015 1410   CO2 25 03/11/2015 1410   GLUCOSE 100* 03/11/2015 1410   BUN 11 03/11/2015 1410   CREATININE 0.86 03/11/2015 1410   CALCIUM 9.2 03/11/2015 1410   PROT 6.4 03/11/2015 1410   ALBUMIN 3.6 03/11/2015 1410   AST 18 03/11/2015 1410   ALT 22 03/11/2015 1410   ALKPHOS 116 03/11/2015 1410   BILITOT 1.1 03/11/2015 1410   GFRNONAA 60* 03/11/2015 1410   GFRAA 70* 03/11/2015 1410   ASSESSMENT AND PLAN  81 y.o. year old female  has a past medical history of transient visual disturbances and right temporal infarct in 2010 from small vessel disease. Vascular risk factors of age , sex,  hypertension atrial fibrillation and hyperlipidemia. She also has a history of chronic back pain with recent falls 3 since last seen. She has mild memory loss. Recent hospitalization for atrial fibrillation. The patient is a current patient of Dr. Leonie Man   who is out of the office today . This note is sent to the work in doctor.     Continue Coumadin for secondary stroke prevention Continue control of blood pressure today's reading 136/84 Continue physical therapy due to recent  falls hx of chronic back pain Encouraged to stay well-hydrated by drinking water instead of caffeinated products Continue to walk for exercise and overall well-being I spent additional 10 minutes in total face to face time with the patient and daughter more than 50% of which was spent counseling and coordination of care, reviewing test results reviewing medications and discussing and reviewing the diagnosis of memory loss/cognitive impairment, stroke, back pain with increased risk for falls and importance of PT and HEP. Also discussed the importance of  modifying vascular risk factors listed above. Follow-up in 6-8 months Next visit with Dr. Lenis Dickinson Cecille Rubin, [facility], [facility], [facility] [address], [place], [place] [address] [phone]

## 2015-03-21 NOTE — Progress Notes (Signed)
I agree with the assessment and plan as directed by NP .The patient is known to me .   Samhita Kretsch, MD  

## 2015-03-21 NOTE — Patient Instructions (Signed)
Continue Coumadin for secondary stroke prevention Continue control of blood pressure today's reading 136/84 Continue physical therapy due to risk of falls Encouraged to stay well-hydrated by drinking water instead of caffeinated products Follow-up in 6-8 months Next visit with Dr. Leonie Man

## 2015-04-10 NOTE — Discharge Summary (Signed)
PATIENT NAME:  Peggy Peggy [name], Peggy Peggy [name] MR#:  [id number] DATE OF BIRTH:  09-Mar-1928  DATE OF ADMISSION:  10/01/2012 DATE OF DISCHARGE:  10/03/2012  ADMITTING DIAGNOSIS: Generalized weakness, lightheadedness, palpitations, and chest pressure.   DISCHARGE DIAGNOSES:  1. Generalized weakness, palpitations, and chest pressure due to atrial fibrillation with RVR. The patient has spontaneously converted to normal sinus rhythm.  2. Chest pressure felt to be likely due to her atrial fibrillation with RVR. Cardiac enzymes were nonrevealing. Echo showed no significant wall motion abnormality.  3. Coronary artery disease status post myocardial infarction in 1999 and had Peggy [name] cardiac catheterization done approximately 3-1/2 years ago showing ostial OM1, OM2 as well as OM3 lesions in small vessel, also revealed 90% stenosis in the first diagonal and 80% stenosis in the second diagonal which is small vessel. Ejection fraction was 60%. The patient had Peggy [name] stress test in June of 2012 which was negative for ischemia.  4. History of small cerebrovascular accident in the past.  5. History of pulmonary embolism 10 years ago.  6. History of incarcerated umbilical hernia status post repair in June 2013.  7. Hypertension.  8. Hyperlipidemia.  9. Status post appendectomy.  10. Status post cholecystectomy.  11. Status post total abdominal hysterectomy and right salpingo-oophorectomy.  12. Breast biopsy status post left mastectomy in April of 2012 for breast cancer.  13. History of admission in 2013 for dyspnea secondary to anxiety.  14. History of left shoulder pain which was felt to be likely due to rotator cuff injury.  15. Gastroesophageal reflux disease.  16. History of right lung pulmonary nodule, stable as of 2008.  17. History of transient ischemic attacks in the past.   CONSULTANT: Bartholome Bill, MD  PERTINENT LABS AND EVALUATIONS:  EKG showed atrial fibrillation with RVR with some left ventricular hypertrophy.   BMP  showed Peggy [name] glucose of 103, BUN 11, creatinine 0.58, sodium 142, potassium 4.3, chloride 109, and CO2 23. LFTs were normal, except slightly elevated AST of 58. Troponin was 0.03. BNP 181.   Portable chest x-ray shows findings suggestive of possible basilar pneumonia versus atelectasis.   TSH 0.989. Fasting lipid panel: Total cholesterol 184, triglyceride 105, HDL 58, and LDL 105.   Echocardiogram showed Peggy [name] normal ejection fraction with no significant valvular abnormalities.   HOSPITAL COURSE: Please refer to the history and physical done by the admitting physician. The patient is Peggy [name] pleasant 81 year old white female with history of coronary artery disease, hypertension, and hyperlipidemia who presented to the hospital with complaint of generalized weakness, palpitations, and chest pressure. The patient was noted to be in atrial fibrillation with RVR and had to be placed on an IV Cardizem drip and was placed in the unit. The patient's heart rate improved with IV Cardizem. Her symptoms started resolving after heart rate was controlled. She was seen in consultation by cardiology who converted her to p.o. Cardizem. She was felt to have Peggy [name] very high CHADS score. Therefore, she was started on anticoagulation with Coumadin. The patient has spontaneously converted to normal sinus rhythm. Her TSH was normal and her echocardiogram showed no significant valvular abnormalities. She will be following up with Dr. Ubaldo Glassing as an outpatient to continue to monitor this. At this time, she is stable for discharge.   DISCHARGE MEDICATIONS:  1. Nitroglycerin 0.4 mg 1 tab every five minutes p.r.n. for chest pain. 2. Aspirin 81 mg 1 tab p.o. daily.  3. Coumadin 4 mg daily.  4. Diltiazem 250 mg 1 tab p.o. daily.  NOTE: The patient is told to stop taking Plavix, atenolol, and losartan. The atenolol and losartan has been stopped due to low borderline blood pressure plus she will be on diltiazem which will also lower her blood  pressure.   DIET: Low sodium, low fat, low cholesterol, regular consistency.   ACTIVITY: As tolerated.     TIMEFRAME FOR FOLLOW UP: Followup in 5 to 7 days with Dr. Ubaldo Glassing and in 1 to 2 weeks with primary MD and check INR in five days at [facility] with results to Dr. Ubaldo Glassing.  TIME SPENT: 40 minutes. ____________________________ Lafonda Mosses Posey Pronto, MD shp:slb D: 10/03/2012 10:06:31 ET T: 10/03/2012 13:05:55 ET JOB#: [id number]  cc: Remiel Corti H. Posey Pronto, MD, <Dictator> Irven Easterly. Kary Kos, MD Alric Seton MD ELECTRONICALLY SIGNED 10/05/2012 18:11

## 2015-04-10 NOTE — Consult Note (Signed)
General Aspect 81 year old female with history of coronary artery disease with cardiac catheterization in the past revealing small vessel disease including OM1 and OM 2 and OM 3. Her LAD, RCA and left circumflex were without significant disease. She was admitted with chest discomfort and dizziness. She was noted to be in atrial fibrillation with rapid ventricular response in the emergency room. She has no prior history of atrial fibrillation. She denied any illicit drug use or any other change in her medications other than taking Claritin-D recently for allergies. Her CHADSS score is 4 with a history of hypertension, age greater than 99, and a previous small CVA. She was placed in IV Cardizem for rate control. Her initial ventricular response was 1:30 to 140. She has converted to normal sinus rhythm on IV Cardizem at 10 mg per hour. She is ruled out for myocardial infarction is currently hemodynamically stable. She has no absolute contraindication to chronic anticoagulation.   Physical Exam:   GEN well developed, no acute distress    HEENT PERRL    NECK supple    RESP normal resp effort  clear BS  no use of accessory muscles    CARD Regular rate and rhythm  Irregular rate and rhythm  Tachycardic  Murmur    Murmur Systolic    Systolic Murmur axilla    ABD denies tenderness  no liver/spleen enlargement  no hernia  normal BS    LYMPH negative neck    EXTR negative cyanosis/clubbing, negative edema    SKIN normal to palpation    NEURO cranial nerves intact, motor/sensory function intact    PSYCH A+O to time, place, person   Review of Systems:   Subjective/Chief Complaint dizziness with rapid heart rate,mild chest discomfort    General: Fatigue    Skin: No Complaints    ENT: No Complaints    Eyes: No Complaints    Neck: No Complaints    Respiratory: No Complaints    Cardiovascular: Chest pain or discomfort  Palpitations    Gastrointestinal: No Complaints     Genitourinary: No Complaints    Vascular: No Complaints    Musculoskeletal: No Complaints    Neurologic: No Complaints    Hematologic: No Complaints    Endocrine: No Complaints    Psychiatric: No Complaints    Review of Systems: All other systems were reviewed and found to be negative    Medications/Allergies Reviewed Medications/Allergies reviewed     PE left X 2:    CVA/Stroke: Silent stroke affected vision   Arthritis:    Cancer, Breast:    HTN:    Diverticulitis:    Hyperlipidemia:    Myocardial Infarct:    left partial mastectomy:    Left Breast Biopsy:    Tonsillectomy:    Appendectomy,:    Cholecystectomy:   Home Medications: Medication Instructions Status  Plavix 75 mg oral tablet 1 tab(s) orally once a day Active  atenolol 25 mg oral tablet 1 tab(s) orally once a day (in the morning) Active  losartan 25 mg oral tablet 1 tab(s) orally once a day (in the morning) Active  nitroglycerin 0.4 mg sublingual tablet 1 tab(s) sublingual every 5 minutes, As Needed- for Chest Pain  Active   EKG:   Interpretation initial EKG was atrial fibrillation with rapid ventricular response. Patient has converted to sinus rhythm    Minocin: Other  Sulfa: Unknown  Iopidine: Unknown    Impression 81 year old female with history of coronary artery disease treated medically, history  of hyperlipidemia and hypertension but no prior atrial fibrillation history who was admitted after developing atrial fibrillation with rapid ventricular response. She was placed on IV diltiazem and has had converted to normal sinus rhythm. She is ruled out for myocardial infarction and is currently hemodynamically stable. Etiology of the event is unclear. Patient has been using Claritin-D but denies any other stimulant use. Her CHADSS score is 4. We discussed chronic anticoagulation with the patient and her daughter. Despite converting to sinus rhythm, her stroke risk given her elevated CHADSS  score remains. She does not appear to have absolute contraindication to chronic anticoagulation therapy.    Plan 1. Will place her on Cardizem 60 mg p.o. q.6 hours and discontinue IV diltiazem one hour after starting to p.o. dose 2. Will rediscuss chronic anticoagulation with the patient and her daughter in a.m. choosing between warfarin therapy versus novel anticoagulation therapies. 3. We'll discharge on p.o. Cardizem long-acting at 180-240 mg a day 4. Continue other medications 5. Ambulate in a.m. and consider discharge if stable and remains in sinus rhythm.   Electronic Signatures: Teodoro Spray (MD)  (Signed 11-Oct-13 21:43)  Authored: General Aspect/Present Illness, History and Physical Exam, Review of System, Past Medical History, Home Medications, EKG , Allergies, Impression/Plan   Last Updated: 11-Oct-13 21:43 by Teodoro Spray (MD)

## 2015-04-10 NOTE — H&P (Signed)
PATIENT NAME:  Peggy [name], Peggy [name] MR#:  [id number] DATE OF BIRTH:  1928/12/13  DATE OF ADMISSION:  10/01/2012  PRIMARY CARE PHYSICIAN: Dr. Kary Kos  HISTORY OF PRESENT ILLNESS: Patient is an 81 year old Caucasian female with history of coronary artery disease, history of hypertension, hyperlipidemia who presented to the hospital with complaints of not feeling well. According to patient she got up in the morning getting up to go on a trip, however, she did not feel well. She felt as if her head was somewhat swimmy headed. She laid down and put her hand on the heart and felt as if her heart rate was up. It was palpitating racing. Because she started noticing left upper chest area pains she decided to take some nitroglycerin. After nitroglycerin she became lightheaded and dizzy, felt presyncopal and decided to call 911 which brought her to Emergency Room for further evaluation. In the Emergency Room she told me she had left chest pain for approximately a few minutes only, it was 5/10 in intensity and radiated to her left arm. As mentioned above, she took nitroglycerin x2 after which she became lightheaded as well as dizzy. Pain was radiating in left arm and no pain at this moment.   PAST MEDICAL HISTORY:  1. History of incarcerated umbilical hernia repair in 05/2012 by Dr. Rochel Brome. 2. History of hypertension. 3. Hyperlipidemia. 4. Coronary artery disease status post myocardial infarction in 1999. Cardiac catheterization done approximately 3.5 years ago showed ostial OM1, OM2 as well as OM3 lesions in small vessel. Also revealed 90% stenosis in first diagonal and 80% stenosis in second diagonal, which is small vessel. Ejection fraction was 60%. Patient had stress test done in June 2012 which was negative for ischemia.  5. History of small cerebrovascular accident in the past.  6. History of pulmonary embolus 10 years ago. 7. Status post appendectomy. 8. Cholecystectomy. 9. Total abdominal hysterectomy,  right salpingo-oophorectomy. 10. Breast biopsy status post left mastectomy in April 2012 for breast carcinoma.  11. History of admission February 2013 for dyspnea secondary to anxiety.  12. History of left shoulder pain which was felt to be likely rotator cuff injury.  13. Gastroesophageal reflux disease with burping as well as dyspepsia.  14. Right lung pulmonary nodule, stable as of 2008 but now it was noted to have small additional nodule. 15. Transient ischemic attack in the past.    MEDICATIONS: Patient medication list at home is as follows: 1. Atenolol 25 mg p.o. daily. 2. Losartan 25 mg p.o. daily.  3. Nitroglycerin 0.4 mg sublingually as needed. 4. Plavix 75 mg p.o. daily.   SOCIAL HISTORY: No smoking, alcohol or drug abuse.   FAMILY HISTORY: Patient's father had heart disease as well as stroke. Patient's mother had heart disease. Siblings with heart disease, breast cancer as well as lung cancer.   REVIEW OF SYSTEMS: CONSTITUTIONAL: As above plus patient was complaining of chest pains, feeling chilly last night, history of bifocal glasses, some sneezing recently, felt as if she was going down with cold, antibiotics for sinus infection was used approximately a few weeks ago, cough with clear phlegm production, some chest pains, edema in lower extremities intermittently, also arrhythmias earlier today and dyspnea on exertion intermittently, nausea earlier today as well as intermittent constipation which seems to be chronic, right lower extremity pain, left upper skin muscle pain. Denies any fevers or chills, fatigue or weakness, weight loss or gain. EYES: In regards to eyes, denies any blurry vision, double vision, glaucoma. ENT: Denies  any tinnitus, allergies, epistaxis, sinus pain, dentures, difficulty swallowing. RESPIRATORY: Denies any wheezes, asthma, chronic obstructive pulmonary disease. CARDIOVASCULAR: Denies any orthopnea, palpitations or syncope. GASTROINTESTINAL: Denies any  vomiting, diarrhea, constipation, change in bowel habits, however, admits of having some loose bowel movement over the past two days intermittently. GENITOURINARY: No dysuria, hematuria or incontinence. ENDOCRINOLOGY: Denies any polydipsia, nocturia, thyroid problems, heat or cold intolerance, or thirst. HEME: Denies any anemia, easy bruising, bleeding, swollen glands. SKIN: Denies any acne, rash, lesions, change in moles. MUSCULOSKELETAL: Denies arthritis, cramps, swelling, gout. NEUROLOGICAL: Denies any numbness, epilepsy, tremor. PSYCH: Denies anxiety, insomnia or depression.   PHYSICAL EXAMINATION:  VITAL SIGNS: On arrival to the hospital: Temperature 98.8, pulse 156, respirations 20, blood pressure 148/90, saturation 97% on oxygen therapy.   GENERAL: This is a well-nourished Caucasian female laying on the stretcher in no significant distress comfortably.  HEENT: Her pupils are equal, reactive to light. Extraocular movements intact. No icterus or conjunctivitis. Has normal hearing. No pharyngeal erythema. Mucosa is moist.   NECK: No masses, supple, nontender. Thyroid not enlarged. No adenopathy. No jugular venous distention or carotid bruits bilaterally. Full range of motion.   LUNGS: Clear to auscultation anteriorly. No rales, rhonchi, or wheezing. No labored inspirations, increased effort, dullness to percussion, overt respiratory distress.   CARDIOVASCULAR: S1, S2 appreciated. No murmurs, gallops, rubs noted. Rhythm was regular, tachycardic. PMI not lateralized. Chest is nontender to palpation. 1+ pedal pulses. No lower extremity edema, calf tenderness or cyanosis was noted. Patient did have some tenderness in her calf muscles lateral upper area of calf muscles were somewhat tender but no swelling and no other areas of skin injuries or bruising were noted.   ABDOMEN: Soft, nontender. Bowel sounds are present. No hepatosplenomegaly or masses were noted.   RECTAL: Deferred.     MUSCULOSKELETAL: Able to move all extremities. No cyanosis, degenerative joint disease, or kyphosis. Gait is not tested.   SKIN: Skin did not reveal any rashes, lesions, erythema, nodularity, induration. It was warm and dry to palpation.   LYMPH: No adenopathy in cervical region.   NEUROLOGICAL: Cranial nerves grossly intact. Sensory is intact. No dysarthria, aphasia.   PSYCH: Patient is alert, oriented to time, person, place, cooperative. Memory is good. No significant confusion, agitation, or depression noted.   LABORATORY, DIAGNOSTIC, AND RADIOLOGICAL DATA: Patient's EKG done today at around with 9:30 a.m. on 10/01/2012 showed atrial fibrillation, rapid ventricular response at rate of 137 beats per minute, left axis deviation, voltage criteria for left ventricular hypertrophy, also ST depressions in lateral leads high lateral leads. ST depressions in lateral leads are new since June 2013. Also atrial fibrillation is new. BMP done today, 10/01/2012, showed glucose 103, otherwise BMP was unremarkable. Patient's liver enzymes revealed AST elevation to 58, otherwise unremarkable. Cardiac enzymes first set negative. CBC within normal limits. White blood cell count 8.7, hemoglobin 13.2, platelet count 258, absolute neutrophil count is not checked. Chest x-ray done today in the Emergency Room revealed possible atelectasis on the right lower area as well as mild pulmonary vascular congestion.   ASSESSMENT AND PLAN:  1. Atrial fibrillation, rapid ventricular response. Admit patient to medical floor to Critical Care Unit for Cardizem IV drip. Cardiology consultation will be obtained. Will start heparin IV as well.  2. Chest pain. Will continue Cardizem as well as heparin. Will also add aspirin and nitroglycerin topically. Check cardiac enzymes x3. Cardiology consultation is obtained.   3. Hypertension. Will hold ARB. Will allow Cardizem drip to be  titrated if needed and will add also nitroglycerin if  needed.   4. Hyperlipidemia. Will get lipid panel. Will start patient on Lipitor.   TIME SPENT: 50 minutes.  ____________________________ Theodoro Grist, MD rv:cms D: 10/01/2012 12:38:18 ET T: 10/01/2012 14:02:12 ET JOB#: [id number]  cc: Theodoro Grist, MD, <Dictator> Irven Easterly. Kary Kos, MD  Theodoro Grist MD ELECTRONICALLY SIGNED 11/05/2012 12:22

## 2015-04-13 NOTE — Op Note (Signed)
PATIENT NAME:  Peggy [name], Peggy [name] MR#:  [id number] DATE OF BIRTH:  11/08/1928  DATE OF PROCEDURE:  12/31/2012  PREOPERATIVE DIAGNOSIS:  Small bowel obstruction.   POSTOPERATIVE DIAGNOSIS:  Small bowel obstruction.   PROCEDURE:  Laparotomy and lysis of adhesions.   SURGEON:  Rochel Brome, M.D.   ANESTHESIA:  General.   INDICATIONS:  This 81 year old female came into the hospital with abdominal pain, nausea and vomiting. She had abdominal distention and tenderness. She had x-ray findings demonstrating small bowel obstruction, and surgery is recommended for definitive treatment.   DESCRIPTION OF PROCEDURE:  The patient was placed on the operating table in the supine position under general endotracheal anesthesia. The circulating nurse inserted a Foley urinary catheter with Betadine preparation of the perineum draining clear yellow urine. The abdomen was prepared with ChloraPrep, draped in a sterile manner.   A lower abdominal midline incision was made, carried down through subcutaneous tissues. Several small bleeding points were cauterized. The midline fascia was incised, and the peritoneal cavity was opened. This incision was eventually lengthened to approximately 5 inches in length. There was ascites, which was aspirated. There was distended small bowel and also collapsed small bowel. The terminal was examined, and it was followed down to the ileocecal valve. There was palpable stool within a nondistended cecum and ascending colon. The small bowel was then followed in a retrograde direction, approximately 2 to 3 feet and found that it was tethered deep within the peritoneal cavity. Next, the more dilated bowel was examined and was moderately distended, slightly inflamed, and was followed distally and could feel deep within the tissues a band, which actually was lysed with just blunt dissection, and the obstruction was released. It appeared that there had been a closed loop of obstruction, and that there  was an indentation in the  small bowel proximally and distally. The closed loop, itself, was approximately 18 inches in length.  It appeared to have a slight hemorrhagic appearance. The bowel was observed proximally and distally and did appear to be viable. There was no area of necrosis, no leakage. The bowel was followed proximally up to the ligament of Treitz. There was no other area of obstruction in this dilated bowel.  Some of the intestinal contents could be milked down through the previously obstructed point and entered into the terminal ileum and appeared to be widely patent. Next, the bowels were returned to the peritoneal cavity. I could palpate the Foley catheter. There was no palpable pelvic mass.  Next, the omentum was brought beneath the womb, and the midline fascia was closed with interrupted 0 Maxon figure-of-eight sutures. Hemostasis was intact in the subcutaneous tissues, and the skin was closed with clips. Dressings were applied with paper tape. The patient tolerated surgery satisfactorily and was prepared for transfer to the recovery room. We left her Foley catheter intact, also left NG tube intact.     ____________________________ J. Rochel Brome, MD jws:dm D: 12/31/2012 12:47:44 ET T: 12/31/2012 13:20:46 ET JOB#: [id number]  cc: Loreli Dollar, MD, <Dictator> Loreli Dollar MD ELECTRONICALLY SIGNED 01/03/2013 14:04

## 2015-04-13 NOTE — Consult Note (Signed)
PATIENT NAME:  Peggy [name], RADOVICH MR#:  [id number] DATE OF BIRTH:  April 10, 1928  DATE OF CONSULTATION:  01/02/2013  REFERRING PHYSICIAN:   CONSULTING PHYSICIAN:  Isaias Cowman, MD  PRIMARY CARE PHYSICIAN:  Irven Easterly. Kary Kos, MD  CHIEF COMPLAINT:  Abdominal pain.   REASON FOR CONSULTATION:  Consultation requested for evaluation of atrial fibrillation.   HISTORY OF PRESENT ILLNESS:  The patient is an 81 year old female who was admitted on 12/29/2012 with diagnosis of small-bowel obstruction. The patient underwent laparotomy and lysis of adhesions on 12/31/2012. The patient was doing well postoperatively. Laid her hand on her chest and noted that her heart was racing. The patient was not experiencing chest pain, shortness of breath or palpitations. EKG was performed which revealed atrial fibrillation. The patient was recently diagnosed with atrial fibrillation in 09/2012 and treated with warfarin and Cardizem. However, Cardizem has been withheld due to the fact the patient is having small-bowel obstruction and had surgery.   PAST MEDICAL HISTORY:   1.  Atrial fibrillation.  2.  Hypertension.  3.  Gastroesophageal reflux disease.   MEDICATIONS ON ADMISSION:  Aspirin 81 mg daily, warfarin 3 mg daily, diltiazem ER 240 mg daily, Lexapro 5 mg daily, nitroglycerin p.r.n.   SOCIAL HISTORY:  The patient currently lives alone. She denies tobacco or EtOH abuse.   FAMILY HISTORY:  Positive for coronary artery disease and myocardial infarction.   REVIEW OF SYSTEMS:  CONSTITUTIONAL: No fever or chills.  EYES: No blurry vision.  EARS: No hearing loss.  RESPIRATORY: No shortness of breath.  CARDIOVASCULAR: The patient had mid abdominal discomfort prior to surgery with nausea.  GENITOURINARY: No dysuria or hematuria.  ENDOCRINE: No polyuria or polydipsia.  MUSCULOSKELETAL: No arthralgias or myalgias.  NEUROLOGICAL: No focal muscle weakness or numbness.  PSYCHOLOGICAL: No depression or anxiety.    PHYSICAL EXAMINATION: VITAL SIGNS: Blood pressure 116/59, pulse 118, respirations 18, temperature 98.8, pulse oximetry 92%.  HEENT: Pupils equal and reactive to light and accommodation.  NECK: Supple without thyromegaly.  LUNGS: Clear.  HEART: Normal JVP. Normal PMI. Irregular, irregular rhythm. Normal S1, S2. No appreciable gallop, murmur or rub.  ABDOMEN: Soft and nontender. Pulses were intact bilaterally.  MUSCULOSKELETAL: Normal muscle tone.  NEUROLOGIC: The patient is alert and oriented x 3. Motor and sensory are both grossly intact.   IMPRESSION:  An 81 year old female who presents with small-bowel obstruction status post laparotomy and lysis of adhesions who developed recurrent atrial fibrillation while off of Cardizem for several days. The patient is relatively asymptomatic.   RECOMMENDATIONS:   1.  Place patient on telemetry.  2.  Restart Cardizem CD 240 mg daily.  3.  Replete with IV fluids as needed.  4.  No further cardiac diagnostics required at this time.   ____________________________ Isaias Cowman, MD ap:si D: 01/02/2013 11:12:21 ET T: 01/02/2013 16:49:19 ET JOB#: [id number]  cc: Isaias Cowman, MD, <Dictator> Isaias Cowman MD ELECTRONICALLY SIGNED 01/07/2013 8:48

## 2015-04-13 NOTE — H&P (Signed)
PATIENT NAME:  Peggy [name], Peggy [name] MR#:  [id number] DATE OF BIRTH:  1928/12/14  DATE OF ADMISSION:  12/29/2012  HISTORY OF PRESENT ILLNESS:  This 81 year old female came into the Emergency Room with chief complaint of epigastric pain. She was initially evaluated by the Emergency Room staff and referred for general surgery evaluation and admission. She reports that the pain began last night, intermitted in the epigastric area, moderately severe. She continued to hurt off and on during the night and has had 3 episodes of vomiting, the last time was just a small amount. She has not noticed any green fluid, but more of a yellow-orange fluid. She did not see any blood. She is not currently nauseated. Last bowel movement was yesterday, which was small hard balls, passed some flatus yesterday, but not today. She reports no chills or fever. No lower abdominal pain but just epigastric colicky pains. Has received some narcotic analgesics in the Emergency Room with partial relief.   PAST MEDICAL HISTORY:  Was reviewed. Has had:  1.  Heart disease. Has had a remote history of myocardial infarction. Had findings of atrial fibrillation in October and was started on warfarin.  2.  History of hypertension.  3.  Anxiety.  4. Gastroesophageal reflux disease with occasional heartburn, occasional difficulty swallowing large pills.  5.  Recent history of sciatica with pains in the lateral aspect of the left leg, minimal bit of  pain in the right leg.   PAST SURGICAL HISTORY:  Includes:  1.  Left mastectomy for early cancer, not needing any chemotherapy; that was some 2 years ago in April.  2.  Has had appendectomy at the age of 76.  25.  Has had open cholecystectomy.  4.  She has had  umbilical hernia repair last summer.   MEDICATIONS:  Include aspirin 81 mg daily, diltiazem 240 mg extended-release daily,  Lexapro 5 mg daily, nitroglycerin 0.4 mg sublingual p.r.n. Has not had any recent chest pain.  Prevacid 50 mg daily,  warfarin 3 mg daily, Xanax 0.25 mg daily.   ALLERGIES: IOPIDINE, MINOCIN, SULFA.   SOCIAL HISTORY:  Does not smoke. Does not drink any alcohol. She is accompanied by her daughter.   FAMILY HISTORY: Positive for heart disease, stroke.   REVIEW OF SYSTEMS:  HEENT:  She reports just a recent mild cough. Minimally sore throat, which has resolved. She reports no recent visual changes. She reports no swollen glands. Has occasional heartburn, occasional difficulty swallowing large pill.  CHEST/LUNGS:  No respiratory symptoms. No recent chest pains.  CARDIAC: Not aware of any irregular heartbeat.  GENITOURINARY: She reports she is voiding satisfactorily.  EXTREMITIES:  Does have the pain in the lateral aspect left, leg last; less pain, right leg. Does not have ankle edema. Review of systems otherwise negative.   PHYSICAL EXAMINATION:  GENERAL:  She is awake and alert, resting in the ER stretcher.  VITAL SIGNS:  Temperature 98.8, pulse 106, respirations 20, blood pressure 151/74.  SKIN:  Warm and dry without jaundice.  HEENT: Pupils equal, reactive to light. Has intraocular lens implant. Sclerae clear. Pharynx clear.  NECK:  No palpable mass.  RESPIRATORY:  Lungs sounds are clear. No respiratory distress.  HEART:  Regular rhythm, S1 and S2 without murmur.  ABDOMEN:  With mild epigastric tenderness, mildly distended. Bowel sounds present. There is no palpable hernia.  RECTAL:  Exam demonstrated good sphincter tone. No palpable impaction. No palpable mass.  EXTREMITIES:  With no dependent edema.  NEUROLOGIC:  Awake,  alert and oriented, moving all extremities.   CLINICAL DATA:  She had blood work done in the Emergency Room. Glucose 126, BUN 19, potassium 4.2. Liver panel normal. White blood count 15,100, hemoglobin 12.8. Pro time 17.5 seconds.   IMAGING:  I reviewed her CT images, which demonstrate some fluid in her stomach, dilated loops of small bowel, some nondilated loops of terminal ileum,  just a moderate amount of stool within the colon.   IMPRESSION:  Small bowel obstruction.   RECOMMENDATIONS:  I recommended admission to the hospital. I discussed insertion of NG tube for decompression. I discussed this also with the nurse. We will get the bed lined up. Hold off on the warfarin. I discussed the potential need for surgery, and we will give some time for observation.    ____________________________ Lenna Sciara. Rochel Brome, MD jws:dm D: 12/29/2012 09:32:00 ET T: 12/29/2012 09:50:42 ET JOB#: [id number]  cc: Loreli Dollar, MD, <Dictator> Irven Easterly. Kary Kos, MD Loreli Dollar MD ELECTRONICALLY SIGNED 01/03/2013 14:03

## 2015-04-13 NOTE — Discharge Summary (Signed)
PATIENT NAME:  Peggy [name], VALERA MR#:  [id number] DATE OF BIRTH:  [birth year]  DATE OF ADMISSION:  12/29/2012 DATE OF DISCHARGE:  01/06/2013  HISTORY OF PRESENT ILLNESS: This 81 year old female came in emergently into the Emergency Room with chief complaint of epigastric pain. She also had had multiple episodes of vomiting, included bilious vomiting. She was initially evaluated by the Emergency Room staff and had x-ray findings suspicious of small bowel obstruction and was referred for general surgery admission.   PAST MEDICAL HISTORY: Does include: 1. A remote history of myocardial infarction.  2. Hypertension.  3. Anxiety. 4. Gastroesophageal reflux.  5. Sciatica.   PREVIOUS SURGERY: Included: 1. Left mastectomy for cancer.  2. Appendectomy.  3. Cholecystectomy.  4. Umbilical hernia repair.   Other details or other medicines are recorded on the typed H and P. It is noted, she was taking warfarin.   PHYSICAL EXAMINATION: She did have some mild epigastric tenderness and mild distention. Bowel sounds were present. Rectal exam with good sphincter tone. No impaction.   CLINICAL DATA: White blood count 15,100. Glucose 126, BUN 19. Pro time 17.5 seconds.   CT images were reviewed demonstrating fluid in the stomach and dilated loops of small bowel, and also some distal non-dilated loops of small bowel.   HOSPITAL COURSE: She was admitted emergently to the hospital, put on NG suction draining bile. She had no bowel movement or flatus. She was observed. Her pro time the next day was actually higher. Follow-up x-ray showed continued evidence of small bowel obstruction and we gave some additional time for her warfarin to wear off and subsequently seeing that her pro time decreased, she was carried to the operating room. She did have a pre-op prophylactic antibiotic. She had operative findings of adhesions with closed loops, complete small bowel obstruction. The adhesion was lysed. The bowel appeared  viable. Postoperatively, she was continued for a short course of NG suction and subsequently improved and her NG tube was removed. She eventually demonstrated bowel function. Was begun initially on a liquid diet and gradually advanced. She did very well with walking while in the hospital.   She subsequently progressed satisfactorily with her diet and was in satisfactory condition for discharge, so that she was discharged on January 15th. She was advised to continue with her warfarin. Diet and activities were discussed, avoid straining or heavy lifting.   FINAL DIAGNOSES:  1. Small bowel obstruction.  2. Coagulopathy secondary to warfarin.   It is also noted that she had some transient atrial fibrillation and tachycardia treated with diltiazem and did well.  FOLLOWUP: Follow-up plans are to see her in the office.    ____________________________ J. Rochel Brome, MD jws:es D: 01/21/2013 14:31:08 ET T: 01/22/2013 11:13:31 ET JOB#: [id number]  cc: Loreli Dollar, MD, <Dictator> Loreli Dollar MD ELECTRONICALLY SIGNED 01/24/2013 13:00

## 2015-04-13 NOTE — Consult Note (Signed)
Brief Consult Note: Diagnosis: Recurrent AF, off of home meds in setting of SBO.   Patient was seen by consultant.   Consult note dictated.   Comments: REC  Agree with current therapy, place remote tele, restart Cardizem, defer further cardiac diagnostics.  Electronic Signatures: Isaias Cowman (MD)  (Signed 12-Jan-14 11:12)  Authored: Brief Consult Note   Last Updated: 12-Jan-14 11:12 by Isaias Cowman (MD)

## 2015-04-14 NOTE — Discharge Summary (Signed)
PATIENT NAME:  Peggy [name], Peggy [name] MR#:  [id number] DATE OF BIRTH:  31-Aug-1928  DATE OF ADMISSION:  09/29/2014 DATE OF DISCHARGE:  09/30/2014  PRIMARY CARE PHYSICIAN: Irven Easterly. Kary Kos, MD.   DISCHARGE DIAGNOSES: 1. Atypical chest pain.  2. Right shoulder osteoarthritis.  3. Atrial fibrillation.  CONDITION: Stable.   CODE STATUS: Full code.   HOME MEDICATIONS: Please refer to the medication reconciliation list. Continue the patient's home medications.   DIET: Low-sodium diet.   ACTIVITY: As tolerated.   FOLLOWUP CARE: Followup with PCP and Dr. Ubaldo Glassing within 1 to 2 weeks.   REASON FOR ADMISSION: Chest pain.   HOSPITAL COURSE: The patient is an 81 year old, Caucasian female with a history of CAD, Afib, hypertension, hyperlipidemia, who presented to the ED with chest pain for 3 weeks intermittently. The patient also has had 3 weeks' duration of intermittent right shoulder pain not associated with radiation to the neck. For detailed history and physical examination, please refer to admission note dictated by Dr. Lavetta Nielsen.   LABORATORY DATA: On admission date was unremarkable. Troponin level has been negative for 3 sets. INR 1.9.   HOSPITAL COURSE:  1. Atypical chest pain, possibly related to right shoulder pain and arthritis. The patient's troponin level has been negative. She has been treated with her warfarin, statin and aspirin. Dr. Ubaldo Glassing has suggested that the patient may be discharged to home today.  2. Afib is controlled with Cardizem and on Coumadin.  3. The patient denies any chest pain after admission. Vital signs are stable. She is clinically stable and will be discharged to home today.  4. I discussed the patient's discharge plan with the patient, nurse and Dr. Ubaldo Glassing.   TIME SPENT: About 33 minutes.     ____________________________ Demetrios Loll, MD qc:TT D: 09/30/2014 13:42:48 ET T: 09/30/2014 14:35:05 ET JOB#: [id number]  cc: Demetrios Loll, MD, <Dictator> Demetrios Loll MD ELECTRONICALLY  SIGNED 09/30/2014 15:22

## 2015-04-14 NOTE — Discharge Summary (Signed)
PATIENT NAME:  Peggy [name], Peggy [name] MR#:  [id number] DATE OF BIRTH:  04-04-1928  DATE OF ADMISSION:  10/23/2014 DATE OF DISCHARGE:  10/25/2014  ADMISSION DIAGNOSIS: Atrial fibrillation and rapid ventricular response.   DISCHARGE DIAGNOSES: 1.  Atrial fibrillation and rapid ventricular response.  2.  History of depression and anxiety.  3.  History of cerebrovascular accident.   CONSULTATIONS: Cardiology, Dr. Nehemiah Massed.   LABORATORIES AT DISCHARGE: White blood cells 8.5, hemoglobin 11, hematocrit 36, platelets 307,000. INR is 1.3.   PHYSICAL EXAMINATION AT DISCHARGE:  VITAL SIGNS: Temperature 98.3, pulse of 69, respirations 15, blood pressure 124/65, 94% on room air.  GENERAL: The patient is alert, oriented, not in acute distress.  CARDIOVASCULAR: Regular rate and rhythm. No murmurs, gallops, or rubs. PMI was not displaced.  LUNGS: Clear to auscultation without crackles, rales, rhonchi or wheezing. Normal to percussion.  ABDOMEN: Bowel sounds are positive. Nontender, nondistended. No hepatosplenomegaly.  EXTREMITIES: No clubbing, cyanosis, or edema.   HOSPITAL COURSE: This is a very pleasant 81 year old female with a known history of atrial fibrillation who presented in atrial fibrillation with rapid ventricular response. For further details, please refer to the H and P.  1.  Atrial fibrillation and rapid ventricular response. The patient was admitted to the stepdown unit on diltiazem drip. She quickly converted from atrial fibrillation to normal sinus rhythm. Her diltiazem drip was weaned off. She was transitioned to p.o. diltiazem and metoprolol. She was initially placed on a heparin drip due to the INR being 1, but cardiology did not recommend heparin drip but we continued her Coumadin. Her troponins were negative. We appreciate cardiology consultation.  2.  History of depression and anxiety. The patient was continued on outpatient medication.  3.  History of cerebrovascular accident. The  patient was continued on aspirin.   DISCHARGE MEDICATIONS: 1.  Aspirin 81 mg daily.  2.  Diltiazem 240 daily.  3.  Lansoprazole 30 mg daily.  4.  Multivitamin 1 tablet daily.  5.  Xanax 0.25 daily.  6.  Lexapro 10 mg half tablet daily.  7.  Coumadin 2 mg daily.  8.  Calcium carbonate 600 mg daily.  9.  Metoprolol 25 mg b.i.d.   DISCHARGE DIET: Low sodium.   DISCHARGE ACTIVITY: As tolerated.   DISCHARGE FOLLOWUP: The patient will follow up on Monday with Dr. Ubaldo Glassing for blood pressure, heart rate and INR PT.   TIME SPENT: Approximately 35 minutes.   The patient was stable for discharge.    ____________________________ Jaeleah Smyser P. Benjie Karvonen, MD spm:at D: 10/25/2014 11:59:58 ET T: 10/25/2014 15:58:20 ET JOB#: [id number]  cc: Dyllan Kats P. Benjie Karvonen, MD, <Dictator> Irven Easterly. Kary Kos, MD Javier Docker Ubaldo Glassing, MD Ulice Bold P Wong Steadham MD ELECTRONICALLY SIGNED 10/25/2014 21:00

## 2015-04-14 NOTE — Consult Note (Signed)
   Present Illness An 81 year old Caucasian female with a history of coronary artery disease, atrial fibrillation which is rate controlled and antiocagulated with warfarin,, hypertension, hyperlipidemia, presenting with atypical chest pain. Describes 3-weeks of intermittent right shoulder pain with associated radiation to the right side of her neck. She describes it as worse with activity of that arm. She also notes it is worse when she lays on her right side. She states she had some light headedness with the symptoms but denies syncope. She reports compliance with her meds. She has ruled out for an mi and her ekg revealed afib with controlled vr with no ischemic changes. Her cxr was unremarkable.. She is currently symptomatic and has been able to ambulate in her room without difficulty.   Physical Exam:  GEN well nourished, no acute distress   HEENT PERRL   NECK supple   RESP normal resp effort  clear BS  no use of accessory muscles   CARD Irregular rate and rhythm  Normal, S1, S2  Murmur   Murmur Systolic   Systolic Murmur Out flow  right upper sternal border and left upper sternal border   ABD denies tenderness  normal BS   LYMPH negative neck, negative axillae   EXTR negative cyanosis/clubbing, negative edema   SKIN normal to palpation   NEURO cranial nerves intact, motor/sensory function intact   PSYCH A+O to time, place, person   Review of Systems:  Subjective/Chief Complaint admitted with right sided chest pain which has subsided   General: No Complaints   Skin: No Complaints   ENT: No Complaints   Eyes: No Complaints   Neck: No Complaints   Respiratory: No Complaints   Cardiovascular: No Complaints   Gastrointestinal: No Complaints   Genitourinary: No Complaints   Vascular: No Complaints   Musculoskeletal: Muscle or joint pain  right shoulder pain   Neurologic: No Complaints   Hematologic: No Complaints   Endocrine: No Complaints   Psychiatric: No  Complaints   Review of Systems: All other systems were reviewed and found to be negative   Medications/Allergies Reviewed Medications/Allergies reviewed   Family & Social History:  Family and Social History:  Family History Non-Contributory   Social History negative tobacco   Place of Living Home   EKG:  Abnormal NSSTTW changes   Interpretation afib with variable ventricular response    Minocin: Other  Sulfa: Unknown  Iopidine: Unknown   Impression Pt with history of cad, afib, hypertension who was admitted with right sided chest pain with radiation to the right side of her neck. Has ruled out for an mi and has had improvement in her symptoms. She states she was somewhat lightheaded with her pain but has had no arrythmias noted during this admission. She has good rate control of her afib and is tolerating warfarin for anticoagulation. Pain does not appear to be ischemic. LIkley joint and/or muscle imflamation. Would amublate today on current meds and consider discharge later this am if stable.   Plan 1. Conintue with warfarin and digoxin at current dose. 2. Ambulate this am and discharge if stable 3. Follow up in our office for further workup and inr monitoring.   Electronic Signatures: Teodoro Spray (MD)  (Signed 10-Oct-15 09:10)  Authored: General Aspect/Present Illness, History and Physical Exam, Review of System, Family & Social History, EKG , Allergies, Impression/Plan   Last Updated: 10-Oct-15 09:10 by Teodoro Spray (MD)

## 2015-04-14 NOTE — H&P (Signed)
PATIENT NAME:  Peggy [name], Peggy [name] MR#:  [id number] DATE OF BIRTH:  1928/03/27  DATE OF ADMISSION:  10/23/2014  PRIMARY CARE PHYSICIAN:  Dr. Kary Kos.    CARDIOLOGIST:  Dr. Ubaldo Glassing.  HISTORY OF PRESENT ILLNESS: The patient is an 81 year old Caucasian female with history of atrial fibrillation, RVR, on chronic anticoagulation with Coumadin therapy who presents to the hospital with complaints of chest pains. According to patient, she was doing well up until today. She was sitting and watching TV suddenly started burping.  She said to be having chest pain in there sternal area of her chest, sharp pain 8-9 out of 10 by intensity, last approximately 1 hour and radiating to left shoulder accompanied by shortness of breath as well as some feeling of hot and weak. She as also burping as if she had gastroesophageal reflux. She took 3 nitroglycerin tablets nitroglycerin tablets with some relief of her chest pains. On arrival to the Emergency Room, she was noted to be in atrial fibrillation, RVR, at a rate of 142 and hospitalist services were contacted for admission. The patient was initiated on Cardizem IV drip. In the Emergency Room, her heart rate ranged from 130s to 160s.   PAST MEDICAL HISTORY: Significant for history of coronary artery disease, no standing atrial fibrillation, chronic atrial fibrillation on chronic Coumadin anticoagulation, hypertension, hyperlipidemia. Most recent LDL was 121 in October 2015, history of stroke in the past with no residual, some gastroesophageal reflux disease with no known esophagitis.   SOCIAL HISTORY: No tobacco, alcohol, or drug abuse.   FAMILY HISTORY: Coronary artery disease, as well as stroke, late onset.  ALLERGIES: IODINE, MINOCIN, SULFA DRUGS.   MEDICATIONS: According to medical records, the patient is on alprazolam 0.25 mg p.o. daily, aspirin 81 mg p.o. daily, calcium carbonate 300 mg p.o. daily, diltiazem extended release 240 mg p.o. daily, escitalopram 2.5 mg p.o.  daily, lansoprazole 30 mg p.o. daily, multivitamins once daily, and warfarin 2 mg once daily. The patient tells me that her warfarin was changed from 4.5 mg to 2 mg approximately 3 or 4 days ago. She is not on digoxin.   REVIEW OF SYSTEMS:  GENERAL: Positive for weakness, feeling hot, some blurring of vision, pains in  legs, back pain, some chest pains as well as arrhythmias, palpitations earlier today. Denies any high fevers or fatigue, weight loss. EYES: Denies any double vision, glaucoma, or cataracts.  ENT: Denies any tinnitus, allergies, epistaxis, sinus pain, dentures, or difficulty swallowing. Admits of having some allergies, itching nose as well as eyes. No cough, wheezes, asthma, COPD.    CARDIOVASCULAR: Denies orthopnea, edema, arrhythmias, dyspnea on exertion or palpitations. Denies any syncope. GASTROINTESTINAL: Denies any nausea, vomiting, or diarrhea, rectal bleeding, change in bowel habits.  GENITOURINARY: Denies any dysuria, hematuria, frequency, incontinence.  ENDOCRINOLOGY: Denies any polydipsia, nocturia, thyroid problems, heat or cold intolerance. No thirst.  HEMATOLOGIC: Denies any anemia, easy bruising or bleeding, swollen glands.  SKIN: Denies acne, rash, lesions, or change in moles.  MUSCULOSKELETAL: Denies arthritis, cramps, swelling. NEUROLOGIC: Denies numbness, epilepsy, or tremor.  PSYCHIATRIC: Denies anxiety, insomnia, or depression.   PHYSICAL EXAMINATION:  VITAL SIGNS: On arrival to the hospital, temperature was 98.3, pulse 149, respiration was 18, blood pressure 101/73, saturation 95% was on room air.  GENERAL: This is well-developed, well-nourished Caucasian female in no significant distress, lying on the stretcher.  HEENT: Her pupils are equal, reactive to light. Extraocular muscles intact and no icterus or conjunctivitis. Has difficulty hearing. No pharyngeal erythema. Mucosa  is moist.  NECK: No masses. Supple, nontender. Thyroid is not enlarged. No adenopathy.  No JVD or carotid bruits bilaterally. Full range of motion.  LUNGS: Clear to auscultation in all fields. No rales, rhonchi, diminished breath sounds or wheezing. No labored inspiration, increased effort, dullness to percussion or overt respiratory distress.  CARDIOVASCULAR: S1, S2 appreciated. Rhythm is irregular irregular. No murmurs or gallops were heard. PMI not lateralized. Chest is nontender to palpation. One plus pedal pulses. No lower extremity edema, calf tenderness, or cyanosis was noted.   ABDOMEN: Soft, nontender. Bowel sounds are present. No hepatosplenomegaly or masses were noted.  RECTAL: Deferred.  MUSCLE STRENGTH: Able to move all extremities. No cyanosis, degenerative joint disease. The patient does have kyphosis. Gait was not tested.  SKIN: Did not reveal any rashes, lesions, erythema, nodularity, or induration. It was warm and dry to palpation. LYMPHATIC: No adenopathy in the cervical region.  NEUROLOGIC: Cranial grossly intact. Sensory is intact. No dysarthria or aphasia. The patient is alert, oriented to person and place, cooperative. Memory is somewhat impaired.  PSYCHIATRIC: No significant confusion, agitation, or depression were noted.   LABORATORY DATA: BMP showed glucose level of 133, otherwise unremarkable. The patient's beta-type natriuretic peptides was 372, calcium level was 8.1, liver enzymes were not checked. Cardiac enzymes first set was negative. CBC within normal limits with white blood cell count of 10.6, hemoglobin 13.0, platelet count 310,000. MCV is elevated at 101. Coagulation panel: Pro time 13.5. INR 1.0. Urinalysis: Yellow clear urine, negative for glucose, bilirubin, or ketones. Specific gravity 1.011, pH was 6.0, negative for blood, protein, nitrites, or leukocyte esterase, 1 red blood cells, less than 1 white blood cells. No bacteria or epithelial cells were noted, 2 hyaline casts were noted.   EKG showed atrial fibrillation with RVR, left axis deviation, at  rate of 142 beats a minute. Voltage criteria for LVH, cannot rule out septal infarct with QS in V1, V2, nonspecific ST-T changes, ST depressions in lateral leads were noted as compared to prior EKG done in October 2015, ST depressions were markedly changed during this admission on this EKG.   ASSESSMENT AND PLAN:  1. Atrial fibrillation with rapid ventricular response. Admit the patient to the medical floor. Start her on Cardizem IV drip, restart Cardizem p.o. which probably needs to be advanced. The patient may need also digoxin depending on her response, as well as blood pressure readings. Will resume higher doses of Coumadin as well, following the patient's ProTime.   2. Chest pain, likely unstable angina due to atrial fibrillation, rapid ventricular response. Will also add metoprolol, watching her blood pressure very closely. We will continue the patient on nitroglycerin topically and continue aspirin therapy. We will get cardiologist, Dr. Ubaldo Glassing, involved again.  3. Hyperglycemia. Will get hemoglobin A1c.  4. Hypertension. We will continue for now to stabilize her with Cardizem, metoprolol as well as nitroglycerin topically.   TIME SPENT: 50 minutes on the patient.   ____________________________ Theodoro Grist, MD rv:lt D: 10/23/2014 18:35:00 ET T: 10/23/2014 19:18:30 ET JOB#: [id number]  cc: Irven Easterly. Kary Kos, MD   Theodoro Grist MD ELECTRONICALLY SIGNED 11/28/2014 11:32

## 2015-04-14 NOTE — H&P (Signed)
PATIENT NAME:  Peggy [name], Peggy [name] MR#:  [id number] DATE OF BIRTH:  1927/12/24  DATE OF ADMISSION:  09/29/2014  REFERRING PHYSICIAN: Brunilda Payor A. Edd Fabian, MD  PRIMARY CARE PHYSICIAN: Irven Easterly. Kary Kos, MD   CARDIOLOGIST: Javier Docker. Ubaldo Glassing, MD  CHIEF COMPLAINT: Chest pain.   HISTORY OF PRESENT ILLNESS: An 81 year old Caucasian female with a history of coronary artery disease, atrial fibrillation, hypertension, hyperlipidemia, presenting with chest pain. Describes 3-week duration of intermittent right shoulder pain with associated radiation to the neck. She describes it mainly as worsened with activity of that arm. However, today she had an episode which occurred at rest of retrosternal chest pain, pressure in quality, 6 to 7 out of 10 in intensity, nonradiating; associated shortness of breath, diaphoresis. She denies any worsening factors; however, she did take nitroglycerin x 2 with some relief. Thus, presented to the hospital for workup and evaluation.  REVIEW OF SYSTEMS: CONSTITUTIONAL: Denies fever, chills, fatigue, weakness.  EYES: Denies blurred vision, double vision, eye pain.  EARS, NOSE, THROAT: Denies tinnitus, ear pain, hearing loss.  RESPIRATORY: Denies cough, wheeze, shortness of breath. CARDIOVASCULAR: Positive for chest pain as described above. Denies any palpitations.  GASTROINTESTINAL: Denies nausea, vomiting, diarrhea, abdominal pain. GENITOURINARY: Denies dysuria or hematuria.  ENDOCRINE: Denies nocturia or thyroid problems. HEMATOLOGY AND LYMPHATIC: Denies easy bruising, bleeding.  SKIN: Denies rash or lesion.  MUSCULOSKELETAL: Positive for pain in the right shoulder as described above. Denies any neck or back pain. Positive for arthritic symptoms in multiple DIPs bilaterally as well as shoulders and neck.  NEUROLOGIC: Denies paralysis, paresthesias.  PSYCHIATRIC: Denies anxiety or depressive symptoms.  Otherwise, full review of systems performed by me is negative.   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   SOCIAL HISTORY: Denies any tobacco, alcohol, or drug usage.   FAMILY HISTORY: Positive for coronary artery disease as well as CVA, late onset.   ALLERGIES: IOPIDINE, MINOCIN, SULFA DRUGS.  HOME MEDICATIONS: Include aspirin 81 mg p.o. q. daily, tramadol 50 mg 1/2 tablet p.o. b.i.d. as needed for pain, diltiazem 240 mg p.o. q. daily, warfarin 4.5 mg p.o. q. daily, escitalopram 10 mg p.o. q. daily, alprazolam 0.25 mg p.o. at bedtime as needed for sleep, ranitidine 10 mL p.o. q. daily, Align probiotic 4 mg p.o. q. daily, lansoprazole 30 mg p.o. q. daily, multivitamin 1 tab p.o. q. daily, Os-Cal 500+D b.i.d.   PHYSICAL EXAMINATION:  VITAL SIGNS: Temperature 98.6, heart rate 69, respirations 18, blood pressure 119/69, saturating 97% on room air. Weight 68 kg, BMI 29.7.  GENERAL: Well-nourished, well-developed, Caucasian female, currently in no acute distress.  HEAD: Normocephalic, atraumatic.  EYES: Pupils equal, round, reactive to light. Extraocular muscles intact. No scleral icterus.  MOUTH: Moist mucosal membranes. Dentition intact. No abscess noted. EARS, NOSE, THROAT: Clear without exudates. No external lesions.  NECK: Supple. No thyromegaly. No nodules. No JVD.  PULMONARY: Clear to auscultation bilaterally without wheezes, rubs, or rhonchi. No use of accessory muscles. Good respiratory effort.  CHEST: Nontender to palpation.  CARDIOVASCULAR: S1, S2. Irregular rate, irregular rhythm with a 3/6 systolic ejection murmur best heard at right upper sternal border. No edema. Pedal pulses 2+ bilaterally.  GASTROINTESTINAL: Soft, nontender, nondistended. No masses. Positive bowel sounds. No hepatosplenomegaly.  MUSCULOSKELETAL: No swelling, clubbing, or edema. Range of motion is full in all extremities. Using  motions to detect rotator cuff injury, all within normal limits. She  does however, have point tenderness over the right acromioclavicular joint. Otherwise, range of motion is full in all extremities.  NEUROLOGIC: Cranial nerves II through XII intact. No gross focal neurologic deficits. Sensation intact. Reflexes intact.  SKIN: No ulceration, lesions, rash, cyanosis. Skin warm and dry. Turgor intact.  PSYCHIATRIC: Mood and affect within normal limits. Patient alert and oriented x 3. Insight and judgment are intact.   LABORATORY DATA: EKG performed reveals atrial fibrillation without ST or T-wave abnormalities, rate controlled. Sodium 143, potassium 4, chloride 109, bicarbonate 25, BUN 22, creatinine 1.02, glucose 128. Albumin of 3.1, otherwise LFTs within normal limits. Troponin less than 0.02. WBC 10.8, hemoglobin 13.3, platelets of 282,000. INR of 1.9.   ASSESSMENT AND PLAN: An 81 year old Caucasian female with a history of coronary artery disease, atrial fibrillation, hypertension, hyperlipidemia, presenting with chest pain. 1.  Retrosternal chest pain. Admit to telemetry under observational status. Initiate aspirin and statin therapy. Trend cardiac enzymes x 3. Consult cardiology, as she follows with Dr. Ubaldo Glassing on a regular basis.  2.  Osteoarthritis of right shoulder. P.r.n. pain medication with ibuprofen as well as decrease her home dose of trazodone, as she states that she feels that it is too strong.  3.  Atrial fibrillation, currently rate controlled. Continue Cardizem and warfarin.  4.  Gastroesophageal reflux disease. Continue proton pump inhibitor  therapy.  5.  Venous thromboembolism prophylaxis with warfarin.   CODE STATUS: Patient is full code.   TIME SPENT: 45 minutes.    ____________________________ Aaron Mose. Ayrton Mcvay, MD dkh:ST D: 09/29/2014 22:08:05 ET T: 09/29/2014 22:30:53 ET JOB#: [id number]  cc: Aaron Mose. Tymothy Cass, MD, <Dictator> Adama Ivins Woodfin Ganja MD ELECTRONICALLY SIGNED 09/30/2014  20:33

## 2015-04-14 NOTE — Consult Note (Signed)
PATIENT NAME:  Peggy [name], Peggy [name] MR#:  [id number] DATE OF BIRTH:  [birth year]  DATE OF CONSULTATION:  10/24/2014  REFERRING PHYSICIAN:  Theodoro Grist, MD CONSULTING PHYSICIAN:  Corey Skains, MD  REASON FOR CONSULTATION: Paroxysmal atrial fibrillation, new onset, nonvalvular, old myocardial infarction and old cerebrovascular accident.   CHIEF COMPLAINT: "I have some irregular heartbeat."   HISTORY OF PRESENT ILLNESS: This is an 81 year old female with a history of paroxysmal nonvalvular atrial fibrillation who has been on appropriate medication management including a beta blocker as well as calcium channel blocker. The patient did have good heart rate control until the day of admission when she had significant palpitations and irregular heartbeat with shortness of breath. At that time, she was seen in the Emergency Room with atrial fibrillation with rapid ventricular rate. She was placed on a diltiazem drip with better control. Since then she has had better control with increase in dosage of medication management and is comfortable at this time. The patient does continue to have atrial fibrillation. She was placed on heparin for which she had also previously been on anticoagulation including warfarin for a previous stroke and myocardial infarction. The patient did not have any apparent bleeding complications in the past. In the past, she has also had a myocardial infarction on beta blocker.   REVIEW OF SYSTEMS: The remainder review of systems is negative for vision change, ringing in the ears, hearing loss, cough, congestion, heartburn, nausea, vomiting, diarrhea, bloody stools, stomach pain, extremity pain, leg weakness, cramping of the buttocks, known blood clots, headaches, blackouts, dizzy spells, nosebleeds, congestion, trouble swallowing, frequent urination, urination at night, muscle weakness, numbness, anxiety, depression, skin lesions or skin rashes.   PAST MEDICAL HISTORY:  1.  Paroxysmal  nonvalvular atrial fibrillation. 2.  Old myocardial infarction.  3.  Previous peripheral vascular disease with cerebrovascular accident. 4.  Essential hypertension.   FAMILY HISTORY: There are family members with hypertension, but no current evidence of coronary artery disease.   SOCIAL HISTORY: Currently denies alcohol or tobacco use.   ALLERGIES: As listed.   MEDICATIONS: As listed.   PHYSICAL EXAMINATION: VITAL SIGNS: Blood pressure is 110/68 bilaterally and heart rate is 110 upright and reclining and is irregular.  GENERAL: She is a well-appearing female in no acute distress.  HEENT: No icterus, thyromegaly, ulcers, hemorrhage, or xanthelasma.  CARDIOVASCULAR: Irregularly irregular with normal S1 and S2. A 2/6 apical murmur consistent with mitral regurgitation. PMI is inferiorly displaced. Carotid upstroke normal without bruit. Jugular venous pressure is normal.  LUNGS: Clear to auscultation with normal respirations.  ABDOMEN: Soft and nontender without hepatosplenomegaly or masses. Abdominal aorta is normal size without bruit.  EXTREMITIES: Show 2+ radial, femoral, and dorsal pedal pulses with no lower extremity edema, cyanosis, clubbing, or ulcers.  NEUROLOGIC: She is oriented to time, place, and person with normal mood and affect.   ASSESSMENT: An 81 year old female with nonvalvular paroxysmal atrial fibrillation with rapid ventricular rate, old myocardial infarction, old cerebrovascular accident and essential hypertension needing further treatment options.   RECOMMENDATIONS: 1.  Echocardiogram for LV systolic dysfunction worsening, valvular heart disease causing atrial fibrillation.  2.  Heart rate control with beta blocker and calcium channel blocker and discontinuation of diltiazem drip.  3.  Anticoagulation with heparin until further treatment with Coumadin for further risk reduction and stroke with atrial fibrillation.  4.  Begin ambulation and adjustments of medication  management as necessary.  5.  Consideration of ACE inhibitor with beta blocker for previous myocardial infarction.  6.  Further treatment options after above.   ____________________________ Corey Skains, MD bjk:sb D: 10/24/2014 08:41:34 ET T: 10/24/2014 09:11:48 ET JOB#: [id number]  cc: Corey Skains, MD, <Dictator> Corey Skains MD ELECTRONICALLY SIGNED 10/27/2014 7:51

## 2015-04-15 NOTE — H&P (Signed)
PATIENT NAME:  Peggy [name], Peggy [name] MR#:  [id number] DATE OF BIRTH:  April 20, 1928  DATE OF ADMISSION:  06/18/2012  HISTORY OF PRESENT ILLNESS: This 81 year old female referred by Dr. Kary Kos comes in with chief complaint of pain near her navel. She reports she has had some discomfort in recent weeks which has increased in the last few days. She reports that she had been constipated and sometimes would have to strain and has been taking some stool softeners. She reports in the last few days she developed increased pain at the navel and also has developed some redness of the skin surrounding the navel. She reports no nausea or vomiting. No chills or fever. No diarrhea. No current urinary symptoms.   PAST MEDICAL HISTORY:  1. Coronary artery disease. She had myocardial infarction in 1999. History of stress test evaluation with mild lateral ischemia. History of cardiac catheterization with 90% stenosis first diagonal and 80% stenosis second diagonal. Rest of her anatomy was normal. Ejection fraction of 60%.  2. History of hypertension.   3. Hyperlipidemia.  4. History of anxiety. 5. Occasional episodes of dyspnea which have been attributed to anxiety.  PAST SURGICAL HISTORY:  1. Left mastectomy 2012 due to cancer and doing well. 2. Appendectomy. 3. Cholecystectomy. 4. Total abdominal hysterectomy, right salpingo-oophorectomy.   MEDICATIONS:  1. Atorvastatin 20 mg daily.  2. Losartan 25 mg daily.  3. Atenolol 25 mg daily.  4. Plavix 75 mg daily.  5. Prevacid 30 mg daily. 6. Xanax 0.25 mg half tablet at bedtime p.r.n.  7. Multiple vitamins daily.  8. Align 4 mg daily. 9. Calcium 500 with D 2 times per day. 10. Zantac 75 mg daily.  11. Escitalopram 10 mg daily.   ALLERGIES: Iodine, sulfa, minocycline, Augmentin, Cipro.   SOCIAL HISTORY: She lives alone. Does not smoke. Does not drink any alcohol.   FAMILY HISTORY: Positive for diabetes, heart disease, hypertension, stroke, breast cancer, ovarian  cancer.   REVIEW OF SYSTEMS: Has occasional night sweats, some hearing loss, occasional heartburn, joint stiffness, anxiety. No recent sores or boils. No ankle edema. Has had some occasional episodes of sensation of dyspnea but has had evaluation and found to have no pulmonary problems but attributed to anxiety and has not had any of these episodes in the last few weeks.   PHYSICAL EXAMINATION:  GENERAL: She is awake, alert and oriented, minimal degree of distress.   VITAL SIGNS: Height 4 feet 9, weight 135, blood pressure 119/76, pulse 65.   SKIN: Warm and dry. There is erythema of the periumbilical region of the lower abdominal wall.   HEENT: Pupils equal, reactive to light. Extraocular movements are intact. Sclerae clear. Pharynx clear.   NECK: No palpable mass.   LUNGS: Lungs sounds are clear.   HEART: Regular rhythm, S1 and S2.   ABDOMEN: Tender mass in the umbilicus which is about 2 cm, exquisitely tender. There is no abdominal distention. There is the erythema as noted which extends over an area of about 10 cm. There is exquisite tenderness at the umbilicus but other parts of the abdomen are nontender.   EXTREMITIES: No dependent edema.   NEUROLOGIC: Awake, alert, oriented, moving all extremities.   IMPRESSION: Incarcerated umbilical hernia.   NOTE: She has had launched just a little while ago.   PLAN: The plan is to get her admitted to the hospital, do some preoperative lab work, CBC, metabolic panel B, EKG. Have her take some liquids evening meal, n.p.o. past midnight and will operate  on her tomorrow morning for repair of incarcerated umbilical hernia. I discussed the operation, care, risks, and benefits with her in detail. I also discussed with her daughter by phone. I will get it scheduled.   ____________________________ Lenna Sciara. Rochel Brome, MD jws:cms D: 06/18/2012 16:11:00 ET T: 06/18/2012 16:34:24 ET JOB#: [id number]  cc: Irven Easterly. Kary Kos, Kamrar Rochel Brome, MD,  <Dictator>  Loreli Dollar MD ELECTRONICALLY SIGNED 06/24/2012 12:47

## 2015-04-15 NOTE — Op Note (Signed)
PATIENT NAME:  Peggy [name], Peggy [name] MR#:  [id number] DATE OF BIRTH:  1928-09-23  DATE OF PROCEDURE:  06/19/2012  PREOPERATIVE DIAGNOSIS: Incarcerated umbilical hernia.   POSTOPERATIVE DIAGNOSIS: Incarcerated umbilical hernia.      PROCEDURE: Umbilical hernia repair.   SURGEON: Rochel Brome, M.D.   ANESTHESIA: General.   INDICATIONS: This 81 year old female came in with umbilical pain with palpable mass, tenderness, and erythema and surgery was recommended for definitive treatment.   DESCRIPTION OF PROCEDURE: The patient was placed on the operating table in the supine position under general endotracheal anesthesia. The abdomen was prepared with ChloraPrep and draped in a sterile manner. A transversely oriented infraumbilical 3 cm incision was made, carried down through subcutaneous tissues to encounter an incarcerated umbilical hernia sac which was some 2 cm in dimension. The overlying skin of the umbilicus was dissected away from the sac and dissection was carried down to the deep fascia. The sac was incised circumferentially and it appeared that there was infarcted omentum within the sac and this was amputated, dividing the pedicle with electrocautery and submitted for routine pathology. The fascial ring defect was delineated and this was repaired with a transversely oriented suture line of interrupted 0 Maxon figure-of-eight sutures. Following this, the wound was inspected. Several small bleeding points were cauterized. The skin of the umbilicus was sutured to the deep fascia with 5-0 Monocryl simple sutures. Next, the skin was closed with a combination of interrupted and running 5-0 Monocryl subcuticular sutures leaving a small opening at both ends for drainage. The wound was also injected with 0.5% Sensorcaine with epinephrine. Dressings were applied with paper tape. The patient tolerated the procedure satisfactorily and was then prepared for transfer to the recovery  room. ____________________________ Lenna Sciara. Rochel Brome, MD jws:ap D: 06/19/2012 11:47:55 ET            T: 06/19/2012 12:20:16 ET               JOB#: [id number] cc: Loreli Dollar, MD, <Dictator> Loreli Dollar MD ELECTRONICALLY SIGNED 06/20/2012 11:59

## 2015-04-15 NOTE — H&P (Signed)
PATIENT NAME:  Peggy [name], Peggy [name] MR#:  [id number] DATE OF BIRTH:  1928/08/14  DATE OF ADMISSION:  02/10/2012  PRIMARY CARE PHYSICIAN: [place] Pink, MD  CARDIOLOGIST: Bartholome Bill, MD  REFERRING ED PHYSICIAN: Arman Filter, MD  CHIEF COMPLAINT: Substernal chest pressure and shortness of breath.  HISTORY OF PRESENT ILLNESS: The patient is an 81 year old white female with history of left mastectomy, hypertension, hyperlipidemia, coronary artery disease, and gastroesophageal reflux disease who reports that she has been having substernal chest pressure since last week. The patient reports that these symptoms last maybe 5 to 10 minutes, not related to activity. She actually walked in the mall the day before yesterday for about 1/2 mile and did not have these symptoms. She had similar presentation on 06/01/2011. At that time, she had a stress test, which was negative.  The patient does also have a history of pulmonary embolism in the past and was on Coumadin about 10 years ago. She otherwise has chronic pain in her left neck, which is unchanged.  She also complains of dyspnea on exertion and rest.  She normally props her bed up so she has not felt any nocturnal dyspnea. She does not have any lower extremity swelling. She otherwise denies any fevers, chills, cough, or lower extremity swelling.   PAST MEDICAL HISTORY:  1. Hypertension. 2. Hyperlipidemia. 3. Coronary artery disease with history of myocardial infarction in May 1999. Cardiac catheterization about 3-1/2 years ago showed ostial OM-1, OM-2, and OM-3 lesions in the small vessel. Also revealed 90% stenosis in the first diagonal and 80% stenosis in the second diagonal which was small vessel. Ejection fraction was 60%. She had a stress test done in June 2012 which was negative for ischemia.  4. History of small cerebrovascular accident, according to her. 5. History of pulmonary embolus 10 years ago.  PAST SURGICAL HISTORY:  1. Status post  appendectomy. 2. Cholecystectomy.  3. Total abdominal hysterectomy.  4. Right salpingo-oophorectomy. 5. Breast biopsy status post left mastectomy in April 2012 for breast cancer.  CURRENT MEDICATIONS: 1. Align 4 mg daily. 2. Alprazolam 0.25 mg at bedtime. 3. Atenolol 25 mg daily. 4. Fish oil 1000 mg every p.m. 5. Lipitor 20 mg daily. 6. Losartan 25 mg daily. 7. Calcium plus vitamin D 600 mg one tablet p.o. twice a day. 8. Plavix 75 mg p.o. daily. 9. Prevacid over-the-counter 15 mg two capsules daily. 10. Tylenol 500 mg one to two tablets at bedtime p.r.n.  11. Zantac 150 mg daily.  SOCIAL HISTORY: Nonsmoker. No alcohol or drug use.  FAMILY HISTORY: Father with heart disease and stroke. Mother with heart disease. Siblings with heart disease, breast cancer, and lung cancer.   REVIEW OF SYSTEMS: CONSTITUTIONAL: Denies any fevers. Complains of fatigue and weakness. No pain. No weight loss or weight gain. EYES: No blurred or double vision. No pain. No redness. No inflammation. ENT: No tinnitus. No ear pain. No hearing loss. No seasonal or year-around allergies. No epistaxis. No nasal discharge. RESPIRATORY: No cough. No wheezing. Complains of dyspnea on exertion. No COPD. No TB. CARDIOVASCULAR: Does complain of chest pressure. No orthopnea. No edema. No arrhythmia. No palpitations. No syncope. GI: No nausea, vomiting, or diarrhea. No abdominal pain. No hematemesis. No melena. No changes in bowel habits. GU: Denies any dysuria, hematuria, renal calculi, or frequency. ENDOCRINE: Denies any polyuria, nocturia, or thyroid problems. HEME/LYMPH: Denies any anemia, easy bruising, or bleeding. SKIN: No acne. No rash. No changes in mole, hair, or skin. MUSCULOSKELETAL: Has pain in  her shoulder. NEURO: No numbness. No CVA. No TIA. PSYCH: No anxiety. No insomnia.   PHYSICAL EXAMINATION:  VITALS: Temperature 98.1, pulse 60, respiratory rate 18, blood pressure 119/76, and oxygen saturation 97%.  GENERAL:  The patient appears her stated age, in no acute distress.   HEENT: Head is normocephalic, atraumatic. Pupils are equally round and reactive to light and accommodation. Extraocular movements are intact. Oropharynx is clear. No JVD or carotid bruits. No scleral icterus or conjunctival pallor.  CARDIOVASCULAR: Regular rate and rhythm. No murmurs, rubs, clicks, or gallops. PMI is nondisplaced.   PULMONARY: Lungs are clear to auscultation bilaterally without any rales, rhonchi, or wheezing.  ABDOMEN: Soft, nontender, and nondistended. Positive bowel sounds. No hepatosplenomegaly.   EXTREMITIES: No clubbing, cyanosis, or edema.   NEURO: Awake, alert, and oriented x3. No focal deficits.   SKIN: No rash.  MUSCULOSKELETAL: No reproducible chest pain.   VASCULAR: Good DP and PT pulses.   LABS/STUDIES: In the emergency department, electrocardiogram showed normal sinus rhythm with findings consistent with LVH. No significant ST or T wave changes.   BMP: Glucose 79, BUN 13, creatinine 0.89, sodium 139, by mouth 3.9, chloride 102, CO2 29. Magnesium 2.1. Lipase 150. Liver function tests were normal. Troponin less than 0.02. WBC 8.6, hemoglobin 13.4, platelet count 264. INR 0.9.   Chest x-ray showed no acute cardiopulmonary processes.   ASSESSMENT AND PLAN: The patient is an 81 year old white female with history of coronary artery disease who has had a stress test in June 2012 which was negative.  She presents with chest pressure and shortness of breath.  1. Chest pressure with history of coronary artery disease: She had a stress test in June 2012 which was negative.  She now presents with similar symptoms. At this time, we will place the patient on observation and check serial enzymes. Check echocardiogram. I will ask her cardiologist, Dr. Ubaldo Glassing, to come evaluate the patient to see if any other treatment needs to be done.  She does have a history of PE. We will check a CT per PE protocol in light of her  shortness of breath and chest symptoms. Continue Plavix. We will add low dose aspirin.  2. History of transient ischemic attack/cerebrovascular accident: Continue Plavix as taking at home. 3. Hypertension: Continue losartan and atenolol as taking at home.  4. Hyperlipidemia: Continue Lipitor. We will check a fasting lipid panel in the a.m. 5. Gastroesophageal reflux disease: On Prevacid, which we will continue at the current dosage.  6. Miscellaneous: We will place her on Lovenox for DVT prophylaxis.   TIME SPENT: 35 minutes.  ____________________________ Lafonda Mosses Posey Pronto, MD shp:slb D: 02/10/2012 12:49:00 ET T: 02/10/2012 13:00:27 ET JOB#: [id number]  cc: Mahki Spikes H. Posey Pronto, MD, <Dictator> Irven Easterly. Kary Kos, MD Alric Seton MD ELECTRONICALLY SIGNED 02/27/2012 7:48

## 2015-04-15 NOTE — Consult Note (Signed)
    General Aspect 81 year old female that presented to [facility] yesterday with chest discomfort that came on after eating.  Patient has been having episodes of chest discomfort following eating, associated with burping.  She does walk on a regular basis and does not notice exertional symptom.  She is on Prevacid in the a.m. and ranitidine in the p.m.  She does have known coronary artery disease being medically managed.  She did have a stress test in June of 2012, which did not show any ischemia. Cardiac enzymes are normal, chest x-ray normal, EKG no acute changes, CT did not show evidence for PE. Echo showed normal LV function, EF 55% with some valvular insufficiency  She is on appropriate cardiac medicines, Plavix, beta blocker, arb and Lipitor. She is to need an evaluation with GI.  She already has a scheduled appointment to see Dr. Ubaldo Glassing, February 28. She has been pain-free in the hospital.  She has been in normal sinus rhythm on telemetry.   Physical Exam:   GEN WD, NAD    HEENT pink conjunctivae    RESP normal resp effort  clear BS    CARD Regular rate and rhythm  Normal, S1, S2  No murmur    ABD normal BS  tenderness left UQ    EXTR negative edema    SKIN normal to palpation, skin turgor decreased    NEURO cranial nerves intact, motor/sensory function intact    PSYCH alert, A+O to time, place, person, good insight   Review of Systems:   Cardiovascular: Chest pain or discomfort  burping associated past eating    Gastrointestinal: as above    Minocin: Other  Sulfa: Unknown  Iopidine: Unknown  Vital Signs/Nurse's Notes: **Vital Signs.:   20-Feb-13 13:16   Vital Signs Type Routine   Temperature Temperature (F) 98.2   Celsius 36.7   Temperature Source oral   Pulse Pulse 70   Pulse source per Dinamap   Respirations Respirations 18   Systolic BP Systolic BP 937   Diastolic BP (mmHg) Diastolic BP (mmHg) 59   Mean BP 74   BP Source Dinamap   Pulse Ox % Pulse Ox % 96   Pulse Ox  Activity Level  At rest   Oxygen Delivery Room Air/ 21 %     Impression 81 year old female presenting with discomfort past eating with normal cardiac enzymes, EKG without acute changes, echo showing normal LV function, with some valve dysfunction.  Chest x-ray clear without significant cardiac findings and having no exertional symptoms. Discussed with Dr. Nehemiah Massed, who feels the patient can be discharged home with followup in GI and with Dr. Ubaldo Glassing, which is pending within the next week. Patient should continue her same medications on discharge.   Electronic Signatures: Roderic Palau (NP)  (Signed [phone] 15:57)  Authored: General Aspect/Present Illness, History and Physical Exam, Review of System, Allergies, Vital Signs/Nurse's Notes, Impression/Plan   Last Updated: 20-Feb-13 15:57 by Roderic Palau (NP)

## 2015-04-15 NOTE — Discharge Summary (Signed)
PATIENT NAME:  Peggy [name], BUSKE MR#:  [id number] DATE OF BIRTH:  [birth year]  DATE OF ADMISSION:  02/10/2012 DATE OF DISCHARGE:  02/11/2012  ADMITTING PHYSICIAN: Dr. Dustin Flock  DISCHARGING PHYSICIAN: Dr. Gladstone Lighter  PRIMARY CARE PHYSICIAN: Dr. Calla Kicks  CONSULTATION IN THE HOSPITAL: Cardiology consultation by Dr. Nehemiah Massed.   DISCHARGE DIAGNOSES:  1. Dyspnea secondary to anxiety. 2. Coronary artery disease status post cardiac catheterization about three years ago with small vessel disease, recent stress test in 05/2011 negative.  3. Hypertension.  4. Hyperlipidemia.  5. History of breast cancer status post left mastectomy.  6. Left shoulder pain, likely rotator cuff injury versus tinnitus.  7. Gastroesophageal reflux disease with bloating and dyspepsia, questionable Helicobacter pylori.  8. Right lung pulmonary nodule, stable as of 2008 except a new small nodule.   9. History of transient ischemic attack.   DISCHARGE HOME MEDICATIONS:  1. Xanax 0.25 mg p.o. at bedtime.  2. Align 4 mg capsule p.o. daily.  3. Plavix 75 mg p.o. daily. 4. Fish oil 1000 mg p.o. daily.  5. Atenolol 25 mg p.o. daily.  6. Atorvastatin 20 mg p.o. daily.  7. Losartan 25 mg p.o. daily.  8. Calcium vitamin D 1 tablet p.o. twice a day.  9. Prevacid 15 mg 2 capsules once a day in the morning.  10. Tylenol 500 mg capsule 1 to 2 tablets once a day at bedtime as needed.  11. Zantac 150 mg p.o. once a day.  12. Lexapro 10 mg p.o. daily.   DISCHARGE DIET: Low sodium diet.   DISCHARGE ACTIVITY: As tolerated.    FOLLOW UP INSTRUCTIONS:  1. Follow up with primary care physician in 1 to 2 weeks.  2. Follow up with cardiology in 2 to 3 weeks. Appointment set up with Dr. Ubaldo Glassing for 02/28 at 3:00 p.m.   3. Follow up with Dr. Hervey Ard as scheduled for left breast cancer. 4. GI follow up with Dr. Gustavo Lah in 1 to 2 weeks.   LABORATORY, DIAGNOSTIC AND RADIOLOGICAL DATA: Labs at the time of discharge:  WBC 8.0, hemoglobin 12.9, hematocrit 37.6, platelet count 237.   Sodium 142, potassium 4.0, chloride 103, bicarbonate 26, BUN 14, creatinine 0.96, glucose 95, calcium 8.9. Troponins have remained negative while in the hospital. Chest x-ray on admission showing no acute changes, clear lung fields. Echo Doppler showing LV systolic function is normal, ejection fraction of 55%, mildly dilated left atrium and moderate mitral regurgitation. CT of the chest with PE protocol showing no pulmonary embolus identified. Multiple 3 to 4 mm pulmonary nodules are present in the right lung. They are similar to the prior study as of 03/15/2007 except the new small right costophrenic angle nodule which is new. Recommended follow-up CT in 6 to 12 months.   BRIEF HOSPITAL COURSE: [name] is an 81 year old elderly Caucasian female with past medical history significant for history of breast cancer status post left-sided mastectomy, hypertension, hyperlipemia, coronary artery disease with small vessel disease on catheterization not amenable for stenting who recently had a stress test 05/2011 which was negative, comes to the hospital complaining of dyspnea. Patient says she does have trouble with dyspnea and gets very anxious easily even on exertion and also at rest. She said she has been having symptoms of dyspepsia, bloating and went to see GI, Dr. Marton Redwood PA, was noted to be dyspneic and was sent to ER. She denies any chest pressure and has been ambulating very well, took a shower in the hospital  this morning.  1. Dyspnea. After listening to the whole story looking at her CT chest and also echo it seems more like maybe most of it is secondary to anxiety. Her dyspnea also happens mostly after she eats with some chest pressure. She does have probable bad reflux problem versus H. pylori disease that needs to be looked into. She is being started on Lexapro 10 mg as she has mild underlying subclinical depression because of her  recent moving, leaving all her friends from a different place to the city living by herself. She is also on Xanax at bedtime for anxiety. She will be following up with GI, Dr. Gustavo Lah, on the 27th of February at 8:30 a.m. and might need upper GI endoscopy or H. pylori work at the time.  2. History of coronary artery disease with history of prior transient ischemic attack. Patient is already on Plavix. She is on atenolol, statin and also losartan.  3. History of breast cancer status post left mastectomy. She follows up with Dr. Bary Castilla. 4. Known history of right pulmonary nodules and a new is found and she will need follow-up CT in 6 to 12 months.  5. Left shoulder pain, rotator cuff versus tendinitis. Tender to touch in the deltoid region and difficulty with abduction and external rotation. She follows up with primary care physician for the same and has an MRI scheduled as an outpatient.  6. Her course has been otherwise uneventful in the hospital. Patient has been ambulating well without any chest pressure. Discussed with Dr. Nehemiah Massed as patient was seen by Dr. Ubaldo Glassing and Dr. Saralyn Pilar in the past admissions. Per cardiology standpoint Dr. Nehemiah Massed said that she is okay to be discharged.   DISCHARGE CONDITION: Stable.   DISCHARGE DISPOSITION: Home.   TIME SPENT ON DISCHARGE: 40 minutes.   ____________________________ Gladstone Lighter, MD rk:cms D: 02/11/2012 15:55:38 ET T: 02/12/2012 10:20:17 ET JOB#: [id number]  cc: Gladstone Lighter, MD, <Dictator> Dory Horn. Eliberto Ivory, MD Corey Skains, MD Lollie Sails, MD Irven Easterly. Kary Kos, MD Gladstone Lighter MD ELECTRONICALLY SIGNED 02/13/2012 15:54

## 2015-04-25 ENCOUNTER — Encounter: Payer: Self-pay | Admitting: General Surgery

## 2015-07-24 ENCOUNTER — Ambulatory Visit (INDEPENDENT_AMBULATORY_CARE_PROVIDER_SITE_OTHER): Payer: Medicare Other | Admitting: Neurology

## 2015-07-24 ENCOUNTER — Encounter: Payer: Self-pay | Admitting: Neurology

## 2015-07-24 VITALS — BP 129/84 | HR 93 | Ht <= 58 in | Wt 126.6 lb

## 2015-07-24 DIAGNOSIS — R413 Other amnesia: Secondary | ICD-10-CM

## 2015-07-24 DIAGNOSIS — I6350 Cerebral infarction due to unspecified occlusion or stenosis of unspecified cerebral artery: Secondary | ICD-10-CM

## 2015-07-24 DIAGNOSIS — W19XXXA Unspecified fall, initial encounter: Secondary | ICD-10-CM

## 2015-07-24 NOTE — Patient Instructions (Addendum)
I had a long discussion with the patient and her daughter regarding her mild memory difficulties which are likely due to age-related mild cognitive impairment which appear stable. I recommend she start taking Reservetarol 200 mg daily as well as participate in mentally challenging activities like solving crossword puzzles, sudoku and playing bridge. Continue warfarin for secondary stroke prevention and maintain strict control of hypertension with blood pressure goal below 130/90 and lipids with LDL cholesterol goal below 70 mg percent. She was also advised fall prevention precautions. Check CT scan of the head without contrast due to her recent fall to rule out any subdural. Return for follow-up in 6 months or call earlier if necessary  Fall Prevention and [place] cause injuries and can affect all age groups. It is possible to use preventive measures to significantly decrease the likelihood of falls. There are many simple measures which can make your home safer and prevent falls. OUTDOORS  Repair cracks and edges of walkways and driveways.  Remove high doorway thresholds.  Trim shrubbery on the main path into your home.  Have good outside lighting.  Clear walkways of tools, rocks, debris, and clutter.  Check that handrails are not broken and are securely fastened. Both sides of steps should have handrails.  Have leaves, snow, and ice cleared regularly.  Use sand or salt on walkways during winter months.  In the garage, clean up grease or oil spills. BATHROOM 1. Install night lights. 2. Install grab bars by the toilet and in the tub and shower. 3. Use non-skid mats or decals in the tub or shower. 4. Place a plastic non-slip stool in the shower to sit on, if needed. 5. Keep floors dry and clean up all water on the floor immediately. 6. Remove soap buildup in the tub or shower on a regular basis. 7. Secure bath mats with non-slip, double-sided rug tape. 8. Remove throw rugs and  tripping hazards from the floors. BEDROOMS  Install night lights.  Make sure a bedside light is easy to reach.  Do not use oversized bedding.  Keep a telephone by your bedside.  Have a firm chair with side arms to use for getting dressed.  Remove throw rugs and tripping hazards from the floor. KITCHEN  Keep handles on pots and pans turned toward the center of the stove. Use back burners when possible.  Clean up spills quickly and allow time for drying.  Avoid walking on wet floors.  Avoid hot utensils and knives.  Position shelves so they are not too high or low.  Place commonly used objects within easy reach.  If necessary, use a sturdy step stool with a grab bar when reaching.  Keep electrical cables out of the way.  Do not use floor polish or wax that makes floors slippery. If you must use wax, use non-skid floor wax.  Remove throw rugs and tripping hazards from the floor. STAIRWAYS  Never leave objects on stairs.  Place handrails on both sides of stairways and use them. Fix any loose handrails. Make sure handrails on both sides of the stairways are as long as the stairs.  Check carpeting to make sure it is firmly attached along stairs. Make repairs to worn or loose carpet promptly.  Avoid placing throw rugs at the top or bottom of stairways, or properly secure the rug with carpet tape to prevent slippage. Get rid of throw rugs, if possible.  Have an electrician put in a light switch at the top and bottom  of the stairs. OTHER FALL PREVENTION TIPS  Wear low-heel or rubber-soled shoes that are supportive and fit well. Wear closed toe shoes.  When using a stepladder, make sure it is fully opened and both spreaders are firmly locked. Do not climb a closed stepladder.  Add color or contrast paint or tape to grab bars and handrails in your home. Place contrasting color strips on first and last steps.  Learn and use mobility aids as needed. Install an electrical  emergency response system.  Turn on lights to avoid dark areas. Replace light bulbs that burn out immediately. Get light switches that glow.  Arrange furniture to create clear pathways. Keep furniture in the same place.  Firmly attach carpet with non-skid or double-sided tape.  Eliminate uneven floor surfaces.  Select a carpet pattern that does not visually hide the edge of steps.  Be aware of all pets. OTHER HOME SAFETY TIPS  Set the water temperature for 120 F (48.8 C).  Keep emergency numbers on or near the telephone.  Keep smoke detectors on every level of the home and near sleeping areas. Document Released: 11/28/2002 Document Revised: 06/08/2012 Document Reviewed: 02/27/2012 [facility] Patient Information 2015 [place], [place]. This information is not intended to replace advice given to you by your health care provider. Make sure you discuss any questions you have with your health care provider. Memory Compensation Strategies  9. Use "WARM" strategy.  W= write it down  A= associate it  R= repeat it  M= make a mental note  2.   You can keep a Social worker.  Use a 3-ring notebook with sections for the following: calendar, important names and phone numbers,  medications, doctors' names/phone numbers, lists/reminders, and a section to journal what you did  each day.   3.    Use a calendar to write appointments down.  4.    Write yourself a schedule for the day.  This can be placed on the calendar or in a separate section of the Memory Notebook.  Keeping a  regular schedule can help memory.  5.    Use medication organizer with sections for each day or morning/evening pills.  You may need help loading it  6.    Keep a basket, or pegboard by the door.  Place items that you need to take out with you in the basket or on the pegboard.  You may also want to  include a message board for reminders.  7.    Use sticky notes.  Place sticky notes with reminders in a place where  the task is performed.  For example: " turn off the  stove" placed by the stove, "lock the door" placed on the door at eye level, " take your medications" on  the bathroom mirror or by the place where you normally take your medications.  8.    Use alarms/timers.  Use while cooking to remind yourself to check on food or as a reminder to take your medicine, or as a  reminder to make a call, or as a reminder to perform another task, etc.

## 2015-07-24 NOTE — Progress Notes (Signed)
[facility]  PATIENT: Peggy [name] DOB: 1928/05/28   REASON FOR VISIT: follow up for history of stroke, atrial fib, memory loss, increased falls HISTORY FROM: Patient  and daughter  HISTORY OF PRESENT ILLNESS:[name], 81 year old female returns for followup. She was last seen 10/10/14. She has a history of stroke but returns today denying further stroke or TIA symptoms. Carotid Doppler negative for stenosis in September 2014. She has not had further episodes of blurred vision. She was admitted to the hospital 03/11/2015 for atrial fibrillation. She spent one day. There were no changes in her medication She has been having some hip and knee pain and she recently received epidural . She has arthritis.She reports 3 falls since last seen. She has minimal bruising from her warfarin. She continues to live alone and is fully independent in all activities of daily living. She continues to walk for exercise. She is driving without difficulty. She has short-term memory loss. 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 Vascular risk factors of age, sex, hyperlipidemia, hypertension and atrial fibrillation  Update 07/25/2015 :  She returns for f/u after last visit 6 months ago. She had a fall last week when she tried to walk quickly. She sustained a bruise on her right hand as well as right temple but seems to be improving. She did not seek medical help. She also had an episode of bronchitis a few weeks ago and is recovering from that. She states her memory difficulties are mild and are mainly related to new information and short-term memory. She remains on warfarin which is tolerating well and her INR has been quite stable. Her blood pressure is under good control and today it is 129/84. She has not had recent lipid profile checked. On Mini-Mental status exam today she  scored 25/30.   REVIEW OF SYSTEMS: Full 14 system review of systems performed and notable only for those listed, all others are neg: Hearing loss, blurred vision, cough, frequency of urination, joint pain and swelling, easy bruising and bleeding and all other systems negative    ALLERGIES: Allergies  Allergen Reactions  . Alendronate Sodium     REACTION: questionable  . Amoxicillin-Pot Clavulanate   . Etodolac   . Ibandronate Sodium     REACTION: questionable  . Iodine     Other reaction(s): Unknown  . Lactose Intolerance (Gi) Other (See Comments)    GI problems  . Minocycline Hcl   . Risedronate Sodium   . Sulfonamide Derivatives Other (See Comments)    Stomach pain  . Zoledronic Acid     REACTION: "dental problems"    HOME MEDICATIONS: Outpatient Prescriptions Prior to Visit  Medication Sig Dispense Refill  . acetaminophen (TYLENOL) 325 MG tablet Take 325 mg by mouth every 6 (six) hours as needed for mild pain.     Marland Kitchen ALPRAZolam (XANAX) 0.25 MG tablet Take 0.25 mg by mouth at bedtime as needed for anxiety. For sleep    . aspirin 81 MG tablet Take 81 mg by mouth daily.    Marland Kitchen CALCIUM PO Take 1 tablet by mouth daily.     Marland Kitchen diltiazem (CARDIZEM CD) 240 MG 24 hr capsule Take 240 mg by mouth daily.    Marland Kitchen escitalopram (LEXAPRO) 10 MG tablet Take 5 mg by mouth daily.     . metoprolol tartrate (LOPRESSOR) 25 MG tablet Take 25 mg by mouth 2 (  two) times daily.    . Multiple Vitamin (MULTI-VITAMINS) TABS Take 1 tablet by mouth daily.     . nitroGLYCERIN (NITROSTAT) 0.4 MG SL tablet Place 0.4 mg under the tongue every 5 (five) minutes as needed for chest pain. Every 5 minutes as needed    . pravastatin (PRAVACHOL) 20 MG tablet Take 20 mg by mouth daily.    Marland Kitchen tiZANidine (ZANAFLEX) 2 MG tablet Take 1 mg by mouth 3 (three) times daily as needed for muscle spasms.    . traMADol (ULTRAM) 50 MG tablet Take 25 mg by mouth 2 (two) times daily as needed for moderate pain.     Marland Kitchen warfarin (COUMADIN)  3 MG tablet Take 3 mg by mouth daily.    . diazepam (VALIUM) 2 MG tablet   0  . Lansoprazole (PREVACID PO) Take 1 tablet by mouth daily.      No facility-administered medications prior to visit.    PAST MEDICAL HISTORY: Past Medical History  Diagnosis Date  . Essential hypertension, benign   . Arthritis   . Heart disease 2001  . Hypertension 1996  . Stroke 2009  . Heart attack 2001  . Unspecified essential hypertension   . Hearing disorder 2012    hearing aids  . Reflux     acid reflux  . Breast screening, unspecified   . Lump or mass in breast   . Special screening for malignant neoplasms, colon   . Other benign neoplasm of connective and other soft tissue of thorax   . Personal history of malignant neoplasm of breast   . Atrial fibrillation   . Allergy   . Atrial fibrillation   . Atrial fibrillation with rapid ventricular response November 2015    Heart rate up to 140 requiring IV Cardizem  . Breast cancer 2012    Patient underwent a left mastectomy on 03-31-11. Pathology showed DCIS at both sites without an invasive component. Sentinel nodes were negative. The breast lesions were noted to be ER +40%, PR-positive, 5%.   . Malignant neoplasm of upper-inner quadrant of female breast   . Malignant neoplasm of upper-outer quadrant of female breast     PAST SURGICAL HISTORY: Past Surgical History  Procedure Laterality Date  . Mastectomy  2012    left breast  . Breast biopsy      left breast biopsy over 15 years ago   . Upper gi endoscopy  2009    [facility] Dr. Donnella Sham  . Colonoscopy  2009    [facility] Dr. Donnella Sham  . Appendectomy      age 56  . Tonsillectomy      as a child  . Cholecystectomy      40 years ago   . Abdominal hysterectomy      age 65  . Small intestine surgery  12/31/2012    Abdominal exploration, lysis of adhesions for distal small bowel obstruction. Rochel Brome, MD    FAMILY HISTORY: Family History  Problem Relation Age of Onset  . Colon cancer Neg Hx    . Ovarian cancer Neg Hx   . Breast cancer Sister     x 2, in their 35's  . Lung cancer Brother   . Stroke Father   . Heart disease Father   . Heart disease Mother     SOCIAL HISTORY: History   Social History  . Marital Status: Widowed    Spouse Name: N/A  . Number of Children: 2  . Years of Education: 8  Occupational History  . Retired    Social History Main Topics  . Smoking status: Never Smoker   . Smokeless tobacco: Never Used  . Alcohol Use: No  . Drug Use: No  . Sexual Activity: Not on file   Other Topics Concern  . Not on file   Social History Narrative   Patient lives at home alone.    Patient has 2 children.    Patient has a 8th education.    Patient is widowed.    Patient is right handed.    Patient is retired.      PHYSICAL EXAM  Filed Vitals:   07/24/15 1519  BP: 129/84  Pulse: 93  Height: 4\' 8"  (1.422 m)  Weight: 126 lb 9.6 oz (57.425 kg)   Body mass index is 28.4 kg/(m^2). Generalized: Pleasant elderly Caucasian lady in no acute distress  Head: normocephalic and atraumatic,.    Neck: Supple, no carotid bruits  Cardiac: Regular rate rhythm, no murmur  Musculoskeletal: Arthritic distal fingers flexion deformity of hands  Skin  bruising right wrist and forearm. Neurological examination  Mentation: Alert oriented to time, place, history taking. MMSE 25/30 missing one item in orientation and short-term recall and unable to copy a figure . AFT 6. Clock drawing 4/4.Marland KitchenFollows all commands speech and language fluent  Cranial nerve II-XII: Fundoscopic exam not done.Pupils were equal round reactive to light extraocular movements were full, visual field were full on confrontational test. Facial sensation and strength were normal. hearing was intact to finger rubbing bilaterally. Uvula tongue midline. head turning and shoulder shrug were normal and symmetric.Tongue protrusion into cheek strength was normal.  Motor: normal bulk and tone, full  strength in the BUE, BLE, No focal weakness  Sensory: normal and symmetric to light touch, pinprick, and vibration  Coordination: finger-nose-finger, heel-to-shin bilaterally, no dysmetria  Reflexes: 1+ upper lower and symmetric, plantar responses were flexor bilaterally.  Gait and Station: Rising up from seated position without assistance, normal stance, moderate stride, good arm swing, smooth turning, able to perform tiptoe, and heel walking without difficulty. Tandem gait is mildly unsteady. No assistive device   LABS  Lab Results  Component Value Date   WBC 11.3* 03/11/2015   HGB 13.7 03/11/2015   HCT 42.0 03/11/2015   MCV 100.5* 03/11/2015   PLT 267 03/11/2015      Component Value Date/Time   NA 140 03/11/2015 1410   NA 143 10/24/2014 0441   K 4.1 03/11/2015 1410   K 4.0 10/24/2014 0441   CL 105 03/11/2015 1410   CL 109* 10/24/2014 0441   CO2 25 03/11/2015 1410   CO2 26 10/24/2014 0441   GLUCOSE 100* 03/11/2015 1410   GLUCOSE 102* 10/24/2014 0441   BUN 11 03/11/2015 1410   BUN 13 10/24/2014 0441   CREATININE 0.86 03/11/2015 1410   CREATININE 0.73 10/24/2014 0441   CALCIUM 9.2 03/11/2015 1410   CALCIUM 8.6 10/24/2014 0441   PROT 6.4 03/11/2015 1410   PROT 6.4 10/24/2014 0441   ALBUMIN 3.6 03/11/2015 1410   ALBUMIN 2.9* 10/24/2014 0441   AST 18 03/11/2015 1410   AST 28 10/24/2014 0441   ALT 22 03/11/2015 1410   ALT 27 10/24/2014 0441   ALKPHOS 116 03/11/2015 1410   ALKPHOS 71 10/24/2014 0441   BILITOT 1.1 03/11/2015 1410   BILITOT 0.7 10/24/2014 0441   GFRNONAA 60* 03/11/2015 1410   GFRNONAA >60 01/02/2013 1041   GFRAA 70* 03/11/2015 1410   GFRAA >60 01/02/2013 1041  ASSESSMENT   81 y.o. year old female  has a past medical history of transient visual disturbances and right temporal infarct in 2010 from small vessel disease. Vascular risk factors of age , sex,  hypertension atrial fibrillation and hyperlipidemia. She also has a history of chronic back pain  with recent falls 3 since last seen. She has mild memory loss.which appears stable and represents Mild cognitive Impairment  PLAN I had a long discussion with the patient and her daughter regarding her mild memory difficulties which are likely due to age-related mild cognitive impairment which appear stable. I recommend she start taking Reservetarol 200 mg daily as well as participate in mentally challenging activities like solving crossword puzzles, sudoku and playing bridge. Continue warfarin for secondary stroke prevention and maintain strict control of hypertension with blood pressure goal below 130/90 and lipids with LDL cholesterol goal below 70 mg percent. She was also advised fall prevention precautions. Check CT scan of the head without contrast due to her recent fall to rule out any subdural. Return for follow-up in 6 months or call earlier if necessary  Antony Contras, MD [facility] [address], [place], [place] [address] [phone]

## 2015-07-25 ENCOUNTER — Telehealth: Payer: Self-pay | Admitting: Neurology

## 2015-07-25 NOTE — Telephone Encounter (Signed)
Do you want [facility] to call pt to schedule CT Scan or did you want to call the patient

## 2015-08-01 ENCOUNTER — Ambulatory Visit: Payer: Medicare Other

## 2015-08-02 ENCOUNTER — Ambulatory Visit
Admission: RE | Admit: 2015-08-02 | Discharge: 2015-08-02 | Disposition: A | Discharge status: Home / Self Care | Payer: Medicare Other | Source: Ambulatory Visit | Attending: Neurology | Admitting: Neurology

## 2015-08-02 DIAGNOSIS — R413 Other amnesia: Secondary | ICD-10-CM | POA: Diagnosis not present

## 2015-08-02 DIAGNOSIS — W19XXXA Unspecified fall, initial encounter: Secondary | ICD-10-CM

## 2015-08-06 NOTE — Telephone Encounter (Signed)
Rn talk to Peggy [name] patients daughter about her moms CT scan. Peggy [name] is on her moms contact list. Rn informed her daughter Peggy [name] that  her moms  CT scan of the head showed only mild age related changes of shrinkage of the brain. No new or worrisome findings. Peggy [name] stated she understood the findings.

## 2015-08-10 ENCOUNTER — Telehealth: Payer: Self-pay | Admitting: Neurology

## 2015-08-10 NOTE — Telephone Encounter (Signed)
Pt's daughter called and would like to speak with the Dr. About Reservetarol 200 mg daily , she has questions about the dosage on the bottle and then what was told to her. Please call and advise [phone]. May leave message

## 2015-08-14 NOTE — Telephone Encounter (Signed)
Talk to patients daughter about the medication question. Rn explain the below message will be forward to Dr. Leonie Man.

## 2015-08-15 NOTE — Telephone Encounter (Signed)
I called and left message on her answering machine

## 2015-08-29 NOTE — Telephone Encounter (Signed)
Daughter called back to let Peggy [name] know that she did get her question answered regarding Reservetarol.

## 2015-08-29 NOTE — Telephone Encounter (Signed)
Dr.Sethi left message for patients daughter.If daughter calls back just ask her if Dr.Sethi address her mothers reservetarol medication question. If he address it the medication on her voice mail, than the call can be closed out.  Rn call [name] to make sure she got her message from Herman concerning patients Reservetarol 200mg . Rn left message for daughter to call back.

## 2015-09-16 ENCOUNTER — Emergency Department
Admission: EM | Admit: 2015-09-16 | Discharge: 2015-09-16 | Disposition: A | Discharge status: Home / Self Care | Payer: Medicare Other | Attending: Emergency Medicine | Admitting: Emergency Medicine

## 2015-09-16 ENCOUNTER — Emergency Department: Payer: Medicare Other

## 2015-09-16 ENCOUNTER — Encounter: Payer: Self-pay | Admitting: Emergency Medicine

## 2015-09-16 DIAGNOSIS — Z79899 Other long term (current) drug therapy: Secondary | ICD-10-CM | POA: Insufficient documentation

## 2015-09-16 DIAGNOSIS — Z7982 Long term (current) use of aspirin: Secondary | ICD-10-CM | POA: Diagnosis not present

## 2015-09-16 DIAGNOSIS — R079 Chest pain, unspecified: Secondary | ICD-10-CM | POA: Diagnosis present

## 2015-09-16 DIAGNOSIS — I10 Essential (primary) hypertension: Secondary | ICD-10-CM | POA: Insufficient documentation

## 2015-09-16 DIAGNOSIS — I4891 Unspecified atrial fibrillation: Secondary | ICD-10-CM | POA: Diagnosis not present

## 2015-09-16 DIAGNOSIS — Z7901 Long term (current) use of anticoagulants: Secondary | ICD-10-CM | POA: Diagnosis not present

## 2015-09-16 DIAGNOSIS — Z7951 Long term (current) use of inhaled steroids: Secondary | ICD-10-CM | POA: Insufficient documentation

## 2015-09-16 DIAGNOSIS — Z880 Allergy status to penicillin: Secondary | ICD-10-CM | POA: Diagnosis not present

## 2015-09-16 LAB — COMPREHENSIVE METABOLIC PANEL
ALK PHOS: 56 U/L (ref 38–126)
ALT: 17 U/L (ref 14–54)
AST: 25 U/L (ref 15–41)
Albumin: 3.4 g/dL — ABNORMAL LOW (ref 3.5–5.0)
Anion gap: 7 (ref 5–15)
BUN: 18 mg/dL (ref 6–20)
CALCIUM: 8.6 mg/dL — AB (ref 8.9–10.3)
CO2: 25 mmol/L (ref 22–32)
CREATININE: 0.88 mg/dL (ref 0.44–1.00)
Chloride: 108 mmol/L (ref 101–111)
GFR, EST NON AFRICAN AMERICAN: 58 mL/min — AB (ref >60)
Glucose, Bld: 211 mg/dL — ABNORMAL HIGH (ref 65–99)
Potassium: 3.3 mmol/L — ABNORMAL LOW (ref 3.5–5.1)
Sodium: 140 mmol/L (ref 135–145)
Total Bilirubin: 0.9 mg/dL (ref 0.3–1.2)
Total Protein: 6.2 g/dL — ABNORMAL LOW (ref 6.5–8.1)

## 2015-09-16 LAB — PROTIME-INR
INR: 1.37
Prothrombin Time: 17.1 seconds — ABNORMAL HIGH (ref 11.4–15.0)

## 2015-09-16 LAB — CBC
HCT: 39 % (ref 35.0–47.0)
HEMOGLOBIN: 13.1 g/dL (ref 12.0–16.0)
MCH: 32.6 pg (ref 26.0–34.0)
MCHC: 33.5 g/dL (ref 32.0–36.0)
MCV: 97.3 fL (ref 80.0–100.0)
Platelets: 276 10*3/uL (ref 150–440)
RBC: 4.01 MIL/uL (ref 3.80–5.20)
RDW: 14.1 % (ref 11.5–14.5)
WBC: 11.4 10*3/uL — ABNORMAL HIGH (ref 3.6–11.0)

## 2015-09-16 LAB — TROPONIN I

## 2015-09-16 MED ORDER — DILTIAZEM HCL 25 MG/5ML IV SOLN
10.0000 mg | Freq: Once | INTRAVENOUS | Status: AC
  Administered 2015-09-16: 10 mg via INTRAVENOUS
  Filled 2015-09-16: qty 5

## 2015-09-16 NOTE — Discharge Instructions (Signed)

## 2015-09-16 NOTE — ED Notes (Signed)
Pt to ED via EMS transport from home with c/o cp, EMS reports pt initial rhythm was a-fib with RVR and a rate of 170 and BP of 121 80, per EMS pt was given 10 mg of Cardizem and rate came down for a couple of minutes but then returned to previous rhythm and BP decreased to 80/60, 200 cc bolus of fluid given and BP increased to 100/70, up arrival to ED pt in a-fib with rate in 120-140's, pt states she still has some slight left sided chest tightness, skin warm and dry

## 2015-09-16 NOTE — ED Provider Notes (Signed)
[facility] Emergency Department Provider Note  ____________________________________________  Time seen: On arrival, via EMS  I have reviewed the triage vital signs and the nursing notes.   HISTORY  Chief Complaint Chest Pain    HPI Peggy [name] is a 81 y.o. female who presents with complaints of mild pressure-like chest pain. Patient reports she was feeling well this morning and went to church and lunch without difficulty. When she was at home she developed mild sternal chest discomfort. EMS discovered her heart rate was elevated in the 140s and gave her 10 of Cardizem IV after confirming atrial fibrillation. Patient has a history of atrial fibrillation and is on Coumadin. She reports she feels somewhat better now. She denies shortness of breath. No fevers no chills. No nausea no vomiting     Past Medical History  Diagnosis Date  . Essential hypertension, benign   . Arthritis   . Heart disease 2001  . Hypertension 1996  . Stroke 2009  . Heart attack 2001  . Unspecified essential hypertension   . Hearing disorder 2012    hearing aids  . Reflux     acid reflux  . Breast screening, unspecified   . Lump or mass in breast   . Special screening for malignant neoplasms, colon   . Other benign neoplasm of connective and other soft tissue of thorax   . Personal history of malignant neoplasm of breast   . Atrial fibrillation   . Allergy   . Atrial fibrillation   . Atrial fibrillation with rapid ventricular response November 2015    Heart rate up to 140 requiring IV Cardizem  . Breast cancer 2012    Patient underwent a left mastectomy on 03-31-11. Pathology showed DCIS at both sites without an invasive component. Sentinel nodes were negative. The breast lesions were noted to be ER +40%, PR-positive, 5%.   . Malignant neoplasm of upper-inner quadrant of female breast   . Malignant neoplasm of upper-outer quadrant of female breast     Patient Active  Problem List   Diagnosis Date Noted  . Memory loss 03/21/2015  . Abnormality of gait 03/21/2015  . Chest pain 03/11/2015  . Atrial fibrillation with RVR 03/11/2015  . H/O: CVA (cerebrovascular accident) 03/11/2015  . Right temporal lobe infarction 04/10/2014  . Blurred vision 09/05/2013  . 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 EPIGASTRIC PAIN 02/14/2009  . NONSPECIFIC ABN FINDNG RAD&OTH EXAM BILARY TRCT 02/14/2009    Past Surgical History  Procedure Laterality Date  . Mastectomy  2012    left breast  . Breast biopsy      left breast biopsy over 15 years ago   . Upper gi endoscopy  2009    [facility] Dr. Donnella Sham  . Colonoscopy  2009    [facility] Dr. Donnella Sham  . Appendectomy      age 36  . Tonsillectomy      as a child  . Cholecystectomy      40 years ago   . Abdominal hysterectomy      age 89  . Small intestine surgery  12/31/2012    Abdominal exploration, lysis of adhesions for distal small bowel obstruction. Rochel Brome, MD    Current Outpatient Rx  Name  Route  Sig  Dispense  Refill  . acetaminophen (TYLENOL) 325 MG tablet   Oral   Take 325 mg by mouth every 6 (six) hours as needed for  mild pain.          Marland Kitchen albuterol (PROAIR HFA) 108 (90 BASE) MCG/ACT inhaler   Inhalation   Inhale into the lungs.         . ALPRAZolam (XANAX) 0.25 MG tablet   Oral   Take 0.25 mg by mouth at bedtime as needed for anxiety. For sleep         . aspirin 81 MG tablet   Oral   Take 81 mg by mouth daily.         Marland Kitchen CALCIUM PO   Oral   Take 1 tablet by mouth daily.          . diazepam (VALIUM) 2 MG tablet            0   . diltiazem (CARDIZEM CD) 240 MG 24 hr capsule   Oral   Take 240 mg by mouth daily.         Marland Kitchen escitalopram (LEXAPRO) 10 MG tablet   Oral   Take 5 mg by mouth daily.          . Lansoprazole (PREVACID PO)   Oral   Take 1 tablet by mouth daily.          . metoprolol tartrate  (LOPRESSOR) 25 MG tablet   Oral   Take 25 mg by mouth 2 (two) times daily.         . Multiple Vitamin (MULTI-VITAMINS) TABS   Oral   Take 1 tablet by mouth daily.          . nitroGLYCERIN (NITROSTAT) 0.4 MG SL tablet   Sublingual   Place 0.4 mg under the tongue every 5 (five) minutes as needed for chest pain. Every 5 minutes as needed         . pravastatin (PRAVACHOL) 20 MG tablet   Oral   Take 20 mg by mouth daily.         Marland Kitchen tiZANidine (ZANAFLEX) 2 MG tablet   Oral   Take 1 mg by mouth 3 (three) times daily as needed for muscle spasms.         . traMADol (ULTRAM) 50 MG tablet   Oral   Take 25 mg by mouth 2 (two) times daily as needed for moderate pain.          Marland Kitchen triamcinolone (NASACORT) 55 MCG/ACT AERO nasal inhaler   Nasal   Place into the nose.         . warfarin (COUMADIN) 3 MG tablet   Oral   Take 3 mg by mouth daily.           Allergies Alendronate sodium; Amoxicillin-pot clavulanate; Etodolac; Ibandronate sodium; Iodine; Lactose intolerance (gi); Minocycline hcl; Risedronate sodium; Sulfonamide derivatives; and Zoledronic acid  Family History  Problem Relation Age of Onset  . Colon cancer Neg Hx   . Ovarian cancer Neg Hx   . Breast cancer Sister     x 2, in their 91's  . Lung cancer Brother   . Stroke Father   . Heart disease Father   . Heart disease Mother     Social History Social History  Substance Use Topics  . Smoking status: Never Smoker   . Smokeless tobacco: Never Used  . Alcohol Use: No    Review of Systems  Constitutional: Negative for fever. Eyes: Negative for visual changes. ENT: Negative for sore throat Cardiovascular: Positive for chest pain Respiratory: Negative for shortness of breath. Gastrointestinal: Negative for abdominal pain,  vomiting and diarrhea. Genitourinary: Negative for dysuria. Musculoskeletal: Negative for back pain. Skin: Negative for rash. Neurological: Negative for headaches or focal  weakness Psychiatric: No anxiety    ____________________________________________   PHYSICAL EXAM:  VITAL SIGNS: ED Triage Vitals  Enc Vitals Group     BP 09/16/15 1629 118/72 mmHg     Pulse Rate 09/16/15 1629 120     Resp 09/16/15 1629 20     Temp 09/16/15 1629 98.5 F (36.9 C)     Temp Source 09/16/15 1629 Oral     SpO2 09/16/15 1629 96 %     Weight 09/16/15 1629 130 lb (58.968 kg)     Height 09/16/15 1629 4\' 8"  (1.422 m)     Head Cir --      Peak Flow --      Pain Score 09/16/15 1631 2     Pain Loc --      Pain Edu? --      Excl. in [place]? --      Constitutional: Alert and oriented. Well appearing and in no distress. Pleasant and interactive Eyes: Conjunctivae are normal.  ENT   Head: Normocephalic and atraumatic.   Mouth/Throat: Mucous membranes are moist. Cardiovascular: Irregularly irregular rhythm and tachycardia. Normal and symmetric distal pulses are present in all extremities. No murmurs, rubs, or gallops. Respiratory: Normal respiratory effort without tachypnea nor retractions. Breath sounds are clear and equal bilaterally.  Gastrointestinal: Soft and non-tender in all quadrants. No distention. There is no CVA tenderness. Genitourinary: deferred Musculoskeletal: Nontender with normal range of motion in all extremities. No lower extremity tenderness nor edema. Neurologic:  Normal speech and language. No gross focal neurologic deficits are appreciated. Skin:  Skin is warm, dry and intact. No rash noted. Psychiatric: Mood and affect are normal. Patient exhibits appropriate insight and judgment.  ____________________________________________    LABS (pertinent positives/negatives)  Labs Reviewed  CBC - Abnormal; Notable for the following:    WBC 11.4 (*)    All other components within normal limits  COMPREHENSIVE METABOLIC PANEL - Abnormal; Notable for the following:    Potassium 3.3 (*)    Glucose, Bld 211 (*)    Calcium 8.6 (*)    Total Protein 6.2  (*)    Albumin 3.4 (*)    GFR calc non Af Amer 58 (*)    All other components within normal limits  PROTIME-INR - Abnormal; Notable for the following:    Prothrombin Time 17.1 (*)    All other components within normal limits  TROPONIN I    ____________________________________________   EKG  ED ECG REPORT I, Lavonia Drafts, the attending physician, personally viewed and interpreted this ECG.   Date: 09/16/2015  EKG Time: 4:31 PM  Rate: 122  Rhythm: atrial fibrillation, rate 122  Axis: Left axis deviation  Intervals:A. fib  ST&T Change: Nonspecific   ____________________________________________    RADIOLOGY I have personally reviewed any xrays that were ordered on this patient: Chest x-ray unremarkable  ____________________________________________   PROCEDURES  Procedure(s) performed: none  Critical Care performed: yes CRITICAL CARE Performed by: Lavonia Drafts   Total critical care time: 30 min  Critical care time was exclusive of separately billable procedures and treating other patients.  Critical care was necessary to treat or prevent imminent or life-threatening deterioration.  Critical care was time spent personally by me on the following activities: development of treatment plan with patient and/or surrogate as well as nursing, discussions with consultants, evaluation of patient's response to treatment,  examination of patient, obtaining history from patient or surrogate, ordering and performing treatments and interventions, ordering and review of laboratory studies, ordering and review of radiographic studies, pulse oximetry and re-evaluation of patient's condition.   ____________________________________________   INITIAL IMPRESSION / ASSESSMENT AND PLAN / ED COURSE  Pertinent labs & imaging results that were available during my care of the patient were reviewed by me and considered in my medical decision making (see chart for details).  Patient  with mild chest discomfort and atrial fibrillation with rapid ventricular response. She has a history of paroxysmal A. fib and is on Coumadin. 10 mg IV Cardizem given by EMS without success. I ordered an additional 10 mg of IV Cardizem  ----------------------------------------- 6:00 PM on 09/16/2015 -----------------------------------------  Patient reverted to normal sinus rhythm after Cardizem. She is slightly subtherapeutic on her Coumadin but reports she missed a dose yesterday. I have consulted cardiology and Dr. Nehemiah Massed who plans to see the patient in the emergency department  ----------------------------------------- 7:10 PM on 09/16/2015 -----------------------------------------  Patient continues to be feeling well, without chest pain and in normal sinus rhythm  Dr. Nehemiah Massed unable to see the patient ED. We discussed the patient's labs and exam and medications and we both feel she is appropriate for discharge. I discussed this with the patient and family and they agree with plan. Dr. Ubaldo Glassing will contact the patient in the morning for close follow-up tomorrow  ____________________________________________   FINAL CLINICAL IMPRESSION(S) / ED DIAGNOSES  Final diagnoses:  Atrial fibrillation with RVR     Lavonia Drafts, MD 09/16/15 2239

## 2015-09-24 ENCOUNTER — Telehealth: Payer: Self-pay | Admitting: Nurse Practitioner

## 2015-09-24 NOTE — Telephone Encounter (Signed)
Patient's daughter Loletha Carrow is calling and states that her mother is doing well and she was under the understanding that her October appointment could be cancelled if this was the case in that she last came in on 07/23/2105. Please call and advise. A message can be left on phone.  Thanks!

## 2015-09-25 NOTE — Telephone Encounter (Signed)
I called and spoke to vickie, daughter of pt.  I told her I cancelled October appt, since Dr. Clydene Fake last note stated 6 mo f/u (this would be in February).  She verbalized understanding.  Pt having stress test today, since episodes of afib.  Will call as needed.

## 2015-10-11 ENCOUNTER — Ambulatory Visit: Payer: Medicare Other | Admitting: Nurse Practitioner

## 2016-01-11 ENCOUNTER — Emergency Department: Payer: Medicare Other

## 2016-01-11 ENCOUNTER — Encounter: Payer: Self-pay | Admitting: Emergency Medicine

## 2016-01-11 ENCOUNTER — Emergency Department
Admission: EM | Admit: 2016-01-11 | Discharge: 2016-01-12 | Disposition: A | Discharge status: Home / Self Care | Payer: Medicare Other | Source: Home / Self Care | Attending: Emergency Medicine | Admitting: Emergency Medicine

## 2016-01-11 DIAGNOSIS — M25551 Pain in right hip: Secondary | ICD-10-CM | POA: Diagnosis not present

## 2016-01-11 DIAGNOSIS — I10 Essential (primary) hypertension: Secondary | ICD-10-CM | POA: Insufficient documentation

## 2016-01-11 DIAGNOSIS — M1611 Unilateral primary osteoarthritis, right hip: Secondary | ICD-10-CM | POA: Insufficient documentation

## 2016-01-11 DIAGNOSIS — Z79899 Other long term (current) drug therapy: Secondary | ICD-10-CM | POA: Insufficient documentation

## 2016-01-11 DIAGNOSIS — Z7982 Long term (current) use of aspirin: Secondary | ICD-10-CM | POA: Insufficient documentation

## 2016-01-11 DIAGNOSIS — I4891 Unspecified atrial fibrillation: Secondary | ICD-10-CM | POA: Diagnosis not present

## 2016-01-11 DIAGNOSIS — G8929 Other chronic pain: Secondary | ICD-10-CM | POA: Insufficient documentation

## 2016-01-11 DIAGNOSIS — Z880 Allergy status to penicillin: Secondary | ICD-10-CM | POA: Insufficient documentation

## 2016-01-11 HISTORY — DX: Pain in unspecified hip: M25.559

## 2016-01-11 NOTE — ED Notes (Signed)
Pt states right hip pain for "weeks". Pt states is taking pain medication (unsure of name) without relief. 2+ pedal pulses noted, cms intact to all toes. No deformity noted to hip.

## 2016-01-11 NOTE — ED Provider Notes (Signed)
[facility] Emergency Department Provider Note  ____________________________________________  Time seen: Approximately 11:19 PM  I have reviewed the triage vital signs and the nursing notes.   HISTORY  Chief Complaint Hip Pain    HPI Lidwina A Whillock is a 82 y.o. female with chronic pain in right hip s/p multiple cortisone injectionsand negative outpatient x-rays who presents with acutely worse pain over the last day.  She is having difficulty with weightbearing although she is still able to do so.  She has an appointment next week with Dr. Clydell Hakim office for further evaluation for probable arthritis in the right hip.  She came to the emergency department for help with pain control and further evaluation for the acutely worse chronic hip pain.  She denies fever/chills, chest pain, shortness of breath, nausea/vomiting, abdominal pain.  She has no numbness or tingling or any focal neurological deficits.  She describes the pain as sharp, worse with movement and weightbearing, and better with rest.  The pain is specifically worse with flexion and abduction of the right hip.   Past Medical History  Diagnosis Date  . Essential hypertension, benign   . Arthritis   . Heart disease 2001  . Hypertension 1996  . Stroke [facility]) 2009  . Heart attack ([place]) 2001  . Unspecified essential hypertension   . Hearing disorder 2012    hearing aids  . Reflux     acid reflux  . Breast screening, unspecified   . Lump or mass in breast   . Special screening for malignant neoplasms, colon   . Other benign neoplasm of connective and other soft tissue of thorax   . Personal history of malignant neoplasm of breast   . Atrial fibrillation ([place])   . Allergy   . Atrial fibrillation ([place])   . Atrial fibrillation with rapid ventricular response [facility]) November 2015    Heart rate up to 140 requiring IV Cardizem  . Breast cancer [facility]) 2012    Patient underwent a left mastectomy on 03-31-11.  Pathology showed DCIS at both sites without an invasive component. Sentinel nodes were negative. The breast lesions were noted to be ER +40%, PR-positive, 5%.   . Malignant neoplasm of upper-inner quadrant of female breast ([place])   . Malignant neoplasm of upper-outer quadrant of female breast ([place])   . Hip pain     Patient Active Problem List   Diagnosis Date Noted  . Memory loss 03/21/2015  . Abnormality of gait 03/21/2015  . Chest pain 03/11/2015  . Atrial fibrillation with RVR ([place]) 03/11/2015  . H/O: CVA (cerebrovascular accident) 03/11/2015  . 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 Blurred vision 09/05/2013  . 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 EPIGASTRIC PAIN 02/14/2009  . NONSPECIFIC ABN FINDNG RAD&OTH EXAM BILARY TRCT 02/14/2009    Past Surgical History  Procedure Laterality Date  . Mastectomy  2012    left breast  . Breast biopsy      left breast biopsy over 15 years ago   . Upper gi endoscopy  2009    [facility] Dr. Donnella Sham  . Colonoscopy  2009    [facility] Dr. Donnella Sham  . Appendectomy      age 65  . Tonsillectomy      as a child  . Cholecystectomy      40 years ago   . Abdominal hysterectomy      age 6  . Small intestine  surgery  12/31/2012    Abdominal exploration, lysis of adhesions for distal small bowel obstruction. Rochel Brome, MD    Current Outpatient Rx  Name  Route  Sig  Dispense  Refill  . acetaminophen (TYLENOL) 325 MG tablet   Oral   Take 325 mg by mouth every 6 (six) hours as needed for mild pain.          Marland Kitchen albuterol (PROAIR HFA) 108 (90 BASE) MCG/ACT inhaler   Inhalation   Inhale into the lungs.         . ALPRAZolam (XANAX) 0.25 MG tablet   Oral   Take 0.25 mg by mouth at bedtime as needed for anxiety. For sleep         . aspirin 81 MG tablet   Oral   Take 81 mg by mouth daily.         Marland Kitchen CALCIUM PO   Oral   Take 1 tablet by mouth daily.          .  diazepam (VALIUM) 2 MG tablet            0   . diltiazem (CARDIZEM CD) 240 MG 24 hr capsule   Oral   Take 240 mg by mouth daily.         Marland Kitchen escitalopram (LEXAPRO) 10 MG tablet   Oral   Take 5 mg by mouth daily.          . Lansoprazole (PREVACID PO)   Oral   Take 1 tablet by mouth daily.          . metoprolol tartrate (LOPRESSOR) 25 MG tablet   Oral   Take 25 mg by mouth 2 (two) times daily.         . Multiple Vitamin (MULTI-VITAMINS) TABS   Oral   Take 1 tablet by mouth daily.          . nitroGLYCERIN (NITROSTAT) 0.4 MG SL tablet   Sublingual   Place 0.4 mg under the tongue every 5 (five) minutes as needed for chest pain. Every 5 minutes as needed         . pravastatin (PRAVACHOL) 20 MG tablet   Oral   Take 20 mg by mouth daily.         Marland Kitchen tiZANidine (ZANAFLEX) 2 MG tablet   Oral   Take 1 mg by mouth 3 (three) times daily as needed for muscle spasms.         . traMADol (ULTRAM) 50 MG tablet      Take 1-2 tablets by mouth every 6 hours as needed for moderate to severe pain   30 tablet   0   . triamcinolone (NASACORT) 55 MCG/ACT AERO nasal inhaler   Nasal   Place into the nose.         Marland Kitchen EXPIRED: warfarin (COUMADIN) 3 MG tablet   Oral   Take 3 mg by mouth daily.           Allergies Alendronate sodium; Amoxicillin-pot clavulanate; Etodolac; Ibandronate sodium; Iodine; Lactose intolerance (gi); Minocycline hcl; Risedronate sodium; Sulfonamide derivatives; and Zoledronic acid  Family History  Problem Relation Age of Onset  . Colon cancer Neg Hx   . Ovarian cancer Neg Hx   . Breast cancer Sister     x 2, in their 34's  . Lung cancer Brother   . Stroke Father   . Heart disease Father   . Heart disease Mother     Social History  Social History  Substance Use Topics  . Smoking status: Never Smoker   . Smokeless tobacco: Never Used  . Alcohol Use: No    Review of Systems Constitutional: No fever/chills Eyes: No visual  changes. ENT: No sore throat. Cardiovascular: Denies chest pain. Respiratory: Denies shortness of breath. Gastrointestinal: No abdominal pain.  No nausea, no vomiting.  No diarrhea.  No constipation. Genitourinary: Negative for dysuria. Musculoskeletal: Acute on chronic pain in the right hip that radiates down to her right knee.  Negative for back pain. Skin: Negative for rash. Neurological: Negative for headaches, focal weakness or numbness.  10-point ROS otherwise negative.  ____________________________________________   PHYSICAL EXAM:  VITAL SIGNS: ED Triage Vitals  Enc Vitals Group     BP 01/11/16 2258 133/79 mmHg     Pulse Rate 01/11/16 2258 61     Resp 01/11/16 2258 16     Temp 01/11/16 2258 97.6 F (36.4 C)     Temp Source 01/11/16 2258 Oral     SpO2 01/11/16 2258 100 %     Weight 01/11/16 2258 122 lb (55.339 kg)     Height 01/11/16 2258 4\' 8"  (1.422 m)     Head Cir --      Peak Flow --      Pain Score 01/11/16 2259 10     Pain Loc --      Pain Edu? --      Excl. in [place]? --     Constitutional: Alert and oriented. Well appearing and in no acute distress. Eyes: Conjunctivae are normal. PERRL. EOMI. Head: Atraumatic. Nose: No congestion/rhinnorhea. Mouth/Throat: Mucous membranes are moist.  Oropharynx non-erythematous. Neck: No stridor.  No cervical spine tenderness to palpation. Cardiovascular: Normal rate, regular rhythm. Grossly normal heart sounds.  Good peripheral circulation. Respiratory: Normal respiratory effort.  No retractions. Lungs CTAB. Gastrointestinal: Soft and nontender. No distention. No abdominal bruits. No CVA tenderness. Musculoskeletal: Chronically deformed fingers consistent with arthritis but they are nontender and nonpainful.  Patient has tenderness to palpation of the right hip but her pelvis is stable.  She has tenderness with passive range of motion of the right hip but there is no obvious deformity.  Neurovascularly intact to the  foot. Neurologic:  Normal speech and language. No gross focal neurologic deficits are appreciated.  Skin:  Skin is warm, dry and intact. No rash noted. Psychiatric: Mood and affect are normal. Speech and behavior are normal.  ____________________________________________   LABS (all labs ordered are listed, but only abnormal results are displayed)  Labs Reviewed - No data to display ____________________________________________  EKG  None ____________________________________________  RADIOLOGY   Ct Hip Right Wo Contrast  01/12/2016  CLINICAL DATA:  82 year old female fell 8 months ago presenting with right hip pain. EXAM: CT OF THE RIGHT HIP WITHOUT CONTRAST TECHNIQUE: Multidetector CT imaging of the right hip was performed according to the standard protocol. Multiplanar CT image reconstructions were also generated. COMPARISON:  CT dated 12/29/2012 FINDINGS: There is osteopenia with chronic changes of the right femoral head and right acetabulum. There is narrowing of the femoroacetabular joint space with proximal migration of the femoral head within the acetabulum compatible with osteoarthritic changes. No acute fracture identified, however evaluation for fracture is limited due to osteopenia. MRI may provide additional information if there is high clinical concern for an acute fracture. There is a small joint effusion. The soft tissues of the right 5 appear unremarkable. No fluid collection or hematoma. IMPRESSION: No acute fracture. Small right  hip joint effusion. Electronically Signed   By: Anner Crete M.D.   On: 01/12/2016 00:27    ____________________________________________   PROCEDURES  Procedure(s) performed: None  Critical Care performed: No ____________________________________________   INITIAL IMPRESSION / ASSESSMENT AND PLAN / ED COURSE  Pertinent labs & imaging results that were available during my care of the patient were reviewed by me and considered in my  medical decision making (see chart for details).  Acute on chronic hip pain.  Pathologic fracture is possible, worsening osteoarthritis is more likely.  Considered MRI, but non-con CT also has good sensitivity for subtle fractures and is faster and more readily available.  I think this is an appropriate alternative to rule out acute issues and patient will be appropriate for outpatient follow up.  Discussed this with patient and daughter who understand and agree.  ----------------------------------------- 12:51 AM on 01/12/2016 -----------------------------------------  No acute abnormalities, CT scan is consistent with chronic arthritis.  I discussed this with the patient and her daughter.  Advised an increased dose of tramadol and close outpatient follow-up.  She understands and agrees with the plan. ____________________________________________  FINAL CLINICAL IMPRESSION(S) / ED DIAGNOSES  Final diagnoses:  Osteoarthritis of right hip, unspecified osteoarthritis type    Modified Medications   Modified Medication Previous Medication   TRAMADOL (ULTRAM) 50 MG TABLET traMADol (ULTRAM) 50 MG tablet      Take 1-2 tablets by mouth every 6 hours as needed for moderate to severe pain    Take 25 mg by mouth 2 (two) times daily as needed for moderate pain.       Hinda Kehr, MD 01/12/16 [phone]

## 2016-01-11 NOTE — ED Notes (Signed)
Patient transported to CT 

## 2016-01-11 NOTE — ED Notes (Signed)
md in to assess pt.  

## 2016-01-12 MED ORDER — TRAMADOL HCL 50 MG PO TABS: ORAL_TABLET | ORAL | Status: DC

## 2016-01-12 NOTE — ED Notes (Signed)
Additional warm blankets provided for comfort. Explanation of ct result progress provided to pt and daughter who verbalize understanding.

## 2016-01-12 NOTE — Discharge Instructions (Signed)
Osteoarthritis Osteoarthritis is a disease that causes soreness and inflammation of a joint. It occurs when the cartilage at the affected joint wears down. Cartilage acts as a cushion, covering the ends of bones where they meet to form a joint. Osteoarthritis is the most common form of arthritis. It often occurs in older people. The joints affected most often by this condition include those in the:  Ends of the fingers.  Thumbs.  Neck.  Lower back.  Knees.  Hips. CAUSES  Over time, the cartilage that covers the ends of bones begins to wear away. This causes bone to rub on bone, producing pain and stiffness in the affected joints.  RISK FACTORS Certain factors can increase your chances of having osteoarthritis, including:  Older age.  Excessive body weight.  Overuse of joints.  Previous joint injury. SIGNS AND SYMPTOMS   Pain, swelling, and stiffness in the joint.  Over time, the joint may lose its normal shape.  Small deposits of bone (osteophytes) may grow on the edges of the joint.  Bits of bone or cartilage can break off and float inside the joint space. This may cause more pain and damage. DIAGNOSIS  Your health care provider will do a physical exam and ask about your symptoms. Various tests may be ordered, such as:  X-rays of the affected joint.  Blood tests to rule out other types of arthritis. Additional tests may be used to diagnose your condition. TREATMENT  Goals of treatment are to control pain and improve joint function. Treatment plans may include:  A prescribed exercise program that allows for rest and joint relief.  A weight control plan.  Pain relief techniques, such as:  Properly applied heat and cold.  Electric pulses delivered to nerve endings under the skin (transcutaneous electrical nerve stimulation [TENS]).  Massage.  Certain nutritional supplements.  Medicines to control pain, such as:  Acetaminophen.  Nonsteroidal  anti-inflammatory drugs (NSAIDs), such as naproxen.  Narcotic or central-acting agents, such as tramadol.  Corticosteroids. These can be given orally or as an injection.  Surgery to reposition the bones and relieve pain (osteotomy) or to remove loose pieces of bone and cartilage. Joint replacement may be needed in advanced states of osteoarthritis. HOME CARE INSTRUCTIONS   Take medicines only as directed by your health care provider.  Maintain a healthy weight. Follow your health care provider's instructions for weight control. This may include dietary instructions.  Exercise as directed. Your health care provider can recommend specific types of exercise. These may include:  Strengthening exercises. These are done to strengthen the muscles that support joints affected by arthritis. They can be performed with weights or with exercise bands to add resistance.  Aerobic activities. These are exercises, such as brisk walking or low-impact aerobics, that get your heart pumping.  Range-of-motion activities. These keep your joints limber.  Balance and agility exercises. These help you maintain daily living skills.  Rest your affected joints as directed by your health care provider.  Keep all follow-up visits as directed by your health care provider. SEEK MEDICAL CARE IF:   Your skin turns red.  You develop a rash in addition to your joint pain.  You have worsening joint pain.  You have a fever along with joint or muscle aches. SEEK IMMEDIATE MEDICAL CARE IF:  You have a significant loss of weight or appetite.  You have night sweats. [place] of Arthritis and Musculoskeletal and Skin Diseases: www.niams.SouthExposed.es  National Institute on  Aging: http://kim-miller.com/  American College of Rheumatology: www.rheumatology.org   This information is not intended to replace advice given to you by your health care provider. Make sure you discuss any questions you  have with your health care provider.   Document Released: 12/08/2005 Document Revised: 12/29/2014 Document Reviewed: 08/15/2013 Elsevier Interactive Patient Education 2016 [place] therapy can help ease sore, stiff, injured, and tight muscles and joints. Heat relaxes your muscles, which may help ease your pain.  RISKS AND COMPLICATIONS If you have any of the following conditions, do not use heat therapy unless your health care provider has approved:  Poor circulation.  Healing wounds or scarred skin in the area being treated.  Diabetes, heart disease, or high blood pressure.  Not being able to feel (numbness) the area being treated.  Unusual swelling of the area being treated.  Active infections.  Blood clots.  Cancer.  Inability to communicate pain. This may include young children and people who have problems with their brain function (dementia).  Pregnancy. Heat therapy should only be used on old, pre-existing, or long-lasting (chronic) injuries. Do not use heat therapy on new injuries unless directed by your health care provider. HOW TO USE HEAT THERAPY There are several different kinds of heat therapy, including:  Moist heat pack.  Warm water bath.  Hot water bottle.  Electric heating pad.  Heated gel pack.  Heated wrap.  Electric heating pad. Use the heat therapy method suggested by your health care provider. Follow your health care provider's instructions on when and how to use heat therapy. GENERAL HEAT THERAPY RECOMMENDATIONS  Do not sleep while using heat therapy. Only use heat therapy while you are awake.  Your skin may turn pink while using heat therapy. Do not use heat therapy if your skin turns red.  Do not use heat therapy if you have new pain.  High heat or long exposure to heat can cause burns. Be careful when using heat therapy to avoid burning your skin.  Do not use heat therapy on areas of your skin that are already  irritated, such as with a rash or sunburn. SEEK MEDICAL CARE IF:  You have blisters, redness, swelling, or numbness.  You have new pain.  Your pain is worse. MAKE SURE YOU:  Understand these instructions.  Will watch your condition.  Will get help right away if you are not doing well or get worse.   This information is not intended to replace advice given to you by your health care provider. Make sure you discuss any questions you have with your health care provider.   Document Released: 03/01/2012 Document Revised: 12/29/2014 Document Reviewed: 01/31/2014 Elsevier Interactive Patient Education [employer].

## 2016-01-15 ENCOUNTER — Encounter: Payer: Self-pay | Admitting: Emergency Medicine

## 2016-01-15 ENCOUNTER — Emergency Department: Payer: Medicare Other

## 2016-01-15 ENCOUNTER — Inpatient Hospital Stay
Admission: EM | Admit: 2016-01-15 | Discharge: 2016-01-16 | DRG: 310 | Disposition: A | Discharge status: Home / Self Care | Payer: Medicare Other | Attending: Internal Medicine | Admitting: Internal Medicine

## 2016-01-15 DIAGNOSIS — H9190 Unspecified hearing loss, unspecified ear: Secondary | ICD-10-CM | POA: Diagnosis present

## 2016-01-15 DIAGNOSIS — Z803 Family history of malignant neoplasm of breast: Secondary | ICD-10-CM

## 2016-01-15 DIAGNOSIS — E876 Hypokalemia: Secondary | ICD-10-CM | POA: Diagnosis present

## 2016-01-15 DIAGNOSIS — Z8249 Family history of ischemic heart disease and other diseases of the circulatory system: Secondary | ICD-10-CM

## 2016-01-15 DIAGNOSIS — G8929 Other chronic pain: Secondary | ICD-10-CM | POA: Diagnosis present

## 2016-01-15 DIAGNOSIS — M1711 Unilateral primary osteoarthritis, right knee: Secondary | ICD-10-CM | POA: Diagnosis present

## 2016-01-15 DIAGNOSIS — M25551 Pain in right hip: Secondary | ICD-10-CM | POA: Diagnosis present

## 2016-01-15 DIAGNOSIS — Z823 Family history of stroke: Secondary | ICD-10-CM | POA: Diagnosis not present

## 2016-01-15 DIAGNOSIS — I252 Old myocardial infarction: Secondary | ICD-10-CM | POA: Diagnosis not present

## 2016-01-15 DIAGNOSIS — M1611 Unilateral primary osteoarthritis, right hip: Secondary | ICD-10-CM | POA: Diagnosis present

## 2016-01-15 DIAGNOSIS — I4891 Unspecified atrial fibrillation: Principal | ICD-10-CM | POA: Diagnosis present

## 2016-01-15 DIAGNOSIS — Z853 Personal history of malignant neoplasm of breast: Secondary | ICD-10-CM | POA: Diagnosis not present

## 2016-01-15 DIAGNOSIS — M25559 Pain in unspecified hip: Secondary | ICD-10-CM | POA: Diagnosis present

## 2016-01-15 DIAGNOSIS — Z7901 Long term (current) use of anticoagulants: Secondary | ICD-10-CM | POA: Diagnosis not present

## 2016-01-15 DIAGNOSIS — M5416 Radiculopathy, lumbar region: Secondary | ICD-10-CM | POA: Diagnosis present

## 2016-01-15 DIAGNOSIS — K219 Gastro-esophageal reflux disease without esophagitis: Secondary | ICD-10-CM | POA: Diagnosis present

## 2016-01-15 DIAGNOSIS — E785 Hyperlipidemia, unspecified: Secondary | ICD-10-CM | POA: Diagnosis present

## 2016-01-15 DIAGNOSIS — I10 Essential (primary) hypertension: Secondary | ICD-10-CM | POA: Diagnosis present

## 2016-01-15 DIAGNOSIS — Z7982 Long term (current) use of aspirin: Secondary | ICD-10-CM | POA: Diagnosis not present

## 2016-01-15 DIAGNOSIS — Z801 Family history of malignant neoplasm of trachea, bronchus and lung: Secondary | ICD-10-CM | POA: Diagnosis not present

## 2016-01-15 DIAGNOSIS — Z8673 Personal history of transient ischemic attack (TIA), and cerebral infarction without residual deficits: Secondary | ICD-10-CM | POA: Diagnosis not present

## 2016-01-15 DIAGNOSIS — Z9012 Acquired absence of left breast and nipple: Secondary | ICD-10-CM

## 2016-01-15 LAB — PROTIME-INR
INR: 4.45
INR: 4.14 — AB
PROTHROMBIN TIME: 41.2 s — AB (ref 11.4–15.0)
Prothrombin Time: 38.8 seconds — ABNORMAL HIGH (ref 11.4–15.0)

## 2016-01-15 LAB — COMPREHENSIVE METABOLIC PANEL
ALT: 20 U/L (ref 14–54)
AST: 33 U/L (ref 15–41)
Albumin: 3.8 g/dL (ref 3.5–5.0)
Alkaline Phosphatase: 88 U/L (ref 38–126)
Anion gap: 6 (ref 5–15)
BUN: 19 mg/dL (ref 6–20)
CHLORIDE: 106 mmol/L (ref 101–111)
CO2: 26 mmol/L (ref 22–32)
Calcium: 9.4 mg/dL (ref 8.9–10.3)
Creatinine, Ser: 0.76 mg/dL (ref 0.44–1.00)
Glucose, Bld: 111 mg/dL — ABNORMAL HIGH (ref 65–99)
POTASSIUM: 3.8 mmol/L (ref 3.5–5.1)
SODIUM: 138 mmol/L (ref 135–145)
Total Bilirubin: 0.8 mg/dL (ref 0.3–1.2)
Total Protein: 7 g/dL (ref 6.5–8.1)

## 2016-01-15 LAB — CBC WITH DIFFERENTIAL/PLATELET
BASOS ABS: 0.1 10*3/uL (ref 0–0.1)
Basophils Relative: 1 %
EOS ABS: 0.1 10*3/uL (ref 0–0.7)
EOS PCT: 1 %
HCT: 40.2 % (ref 35.0–47.0)
HEMOGLOBIN: 13.5 g/dL (ref 12.0–16.0)
LYMPHS PCT: 31 %
Lymphs Abs: 3.8 10*3/uL — ABNORMAL HIGH (ref 1.0–3.6)
MCH: 32.3 pg (ref 26.0–34.0)
MCHC: 33.5 g/dL (ref 32.0–36.0)
MCV: 96.7 fL (ref 80.0–100.0)
Monocytes Absolute: 1.4 10*3/uL — ABNORMAL HIGH (ref 0.2–0.9)
Monocytes Relative: 11 %
NEUTROS PCT: 56 %
Neutro Abs: 6.8 10*3/uL — ABNORMAL HIGH (ref 1.4–6.5)
PLATELETS: 281 10*3/uL (ref 150–440)
RBC: 4.16 MIL/uL (ref 3.80–5.20)
RDW: 13.2 % (ref 11.5–14.5)
WBC: 12.2 10*3/uL — AB (ref 3.6–11.0)

## 2016-01-15 LAB — BASIC METABOLIC PANEL
Anion gap: 8 (ref 5–15)
BUN: 12 mg/dL (ref 6–20)
CALCIUM: 8.7 mg/dL — AB (ref 8.9–10.3)
CHLORIDE: 109 mmol/L (ref 101–111)
CO2: 23 mmol/L (ref 22–32)
CREATININE: 0.65 mg/dL (ref 0.44–1.00)
GFR calc non Af Amer: >60 mL/min (ref >60)
Glucose, Bld: 105 mg/dL — ABNORMAL HIGH (ref 65–99)
Potassium: 4.5 mmol/L (ref 3.5–5.1)
SODIUM: 140 mmol/L (ref 135–145)

## 2016-01-15 LAB — URINALYSIS COMPLETE WITH MICROSCOPIC (ARMC ONLY)
BACTERIA UA: NONE SEEN
Bilirubin Urine: NEGATIVE
GLUCOSE, UA: NEGATIVE mg/dL
Hgb urine dipstick: NEGATIVE
Ketones, ur: NEGATIVE mg/dL
NITRITE: NEGATIVE
Protein, ur: NEGATIVE mg/dL
SPECIFIC GRAVITY, URINE: 1.02 (ref 1.005–1.030)
pH: 5 (ref 5.0–8.0)

## 2016-01-15 LAB — TROPONIN I
TROPONIN I: 0.05 ng/mL — AB (ref <0.031)
Troponin I: 0.04 ng/mL — ABNORMAL HIGH (ref <0.031)

## 2016-01-15 LAB — CBC
HEMATOCRIT: 35.2 % (ref 35.0–47.0)
HEMOGLOBIN: 12.1 g/dL (ref 12.0–16.0)
MCH: 32.8 pg (ref 26.0–34.0)
MCHC: 34.2 g/dL (ref 32.0–36.0)
MCV: 95.9 fL (ref 80.0–100.0)
Platelets: 250 10*3/uL (ref 150–440)
RBC: 3.67 MIL/uL — AB (ref 3.80–5.20)
RDW: 13.2 % (ref 11.5–14.5)
WBC: 9.1 10*3/uL (ref 3.6–11.0)

## 2016-01-15 MED ORDER — NITROGLYCERIN 0.4 MG SL SUBL: 0.4000 mg | SUBLINGUAL_TABLET | SUBLINGUAL | Status: DC | PRN

## 2016-01-15 MED ORDER — ACETAMINOPHEN 325 MG PO TABS: 325.0000 mg | ORAL_TABLET | Freq: Four times a day (QID) | ORAL | Status: DC | PRN

## 2016-01-15 MED ORDER — PRAVASTATIN SODIUM 20 MG PO TABS
20.0000 mg | ORAL_TABLET | Freq: Every day | ORAL | Status: DC
  Administered 2016-01-15 – 2016-01-16 (×2): 20 mg via ORAL
  Filled 2016-01-15 (×2): qty 1

## 2016-01-15 MED ORDER — ALBUTEROL SULFATE HFA 108 (90 BASE) MCG/ACT IN AERS: 2.0000 | INHALATION_SPRAY | Freq: Four times a day (QID) | RESPIRATORY_TRACT | Status: DC

## 2016-01-15 MED ORDER — METOPROLOL TARTRATE 25 MG PO TABS
25.0000 mg | ORAL_TABLET | Freq: Two times a day (BID) | ORAL | Status: DC
  Administered 2016-01-15 – 2016-01-16 (×3): 25 mg via ORAL
  Filled 2016-01-15 (×3): qty 1

## 2016-01-15 MED ORDER — ONDANSETRON HCL 4 MG/2ML IJ SOLN
4.0000 mg | Freq: Once | INTRAMUSCULAR | Status: AC
  Administered 2016-01-15: 4 mg via INTRAVENOUS
  Filled 2016-01-15: qty 2

## 2016-01-15 MED ORDER — SODIUM CHLORIDE 0.9 % IJ SOLN: 3.0000 mL | INTRAMUSCULAR | Status: DC | PRN

## 2016-01-15 MED ORDER — DILTIAZEM HCL ER COATED BEADS 240 MG PO CP24
240.0000 mg | ORAL_CAPSULE | Freq: Every day | ORAL | Status: DC
  Administered 2016-01-15 – 2016-01-16 (×2): 240 mg via ORAL
  Filled 2016-01-15 (×2): qty 1

## 2016-01-15 MED ORDER — MORPHINE SULFATE (PF) 2 MG/ML IV SOLN
2.0000 mg | Freq: Once | INTRAVENOUS | Status: AC
  Administered 2016-01-15: 2 mg via INTRAVENOUS
  Filled 2016-01-15: qty 1

## 2016-01-15 MED ORDER — ASPIRIN 81 MG PO CHEW
81.0000 mg | CHEWABLE_TABLET | Freq: Every day | ORAL | Status: DC
  Administered 2016-01-15 – 2016-01-16 (×2): 81 mg via ORAL
  Filled 2016-01-15 (×2): qty 1

## 2016-01-15 MED ORDER — SODIUM CHLORIDE 0.9 % IV SOLN: 250.0000 mL | INTRAVENOUS | Status: DC | PRN

## 2016-01-15 MED ORDER — SODIUM CHLORIDE 0.9 % IV BOLUS (SEPSIS)
500.0000 mL | Freq: Once | INTRAVENOUS | Status: AC
  Administered 2016-01-15: 500 mL via INTRAVENOUS

## 2016-01-15 MED ORDER — PREDNISONE 10 MG PO TABS
10.0000 mg | ORAL_TABLET | Freq: Every day | ORAL | Status: DC
  Administered 2016-01-15 – 2016-01-16 (×2): 10 mg via ORAL
  Filled 2016-01-15 (×2): qty 1

## 2016-01-15 MED ORDER — DILTIAZEM HCL 100 MG IV SOLR
5.0000 mg/h | Freq: Once | INTRAVENOUS | Status: DC
  Filled 2016-01-15: qty 100

## 2016-01-15 MED ORDER — MORPHINE SULFATE (PF) 2 MG/ML IV SOLN
2.0000 mg | INTRAVENOUS | Status: DC | PRN
  Administered 2016-01-15 (×2): 2 mg via INTRAVENOUS
  Filled 2016-01-15: qty 1

## 2016-01-15 MED ORDER — ADULT MULTIVITAMIN W/MINERALS CH
1.0000 | ORAL_TABLET | Freq: Every day | ORAL | Status: DC
  Administered 2016-01-15 – 2016-01-16 (×2): 1 via ORAL
  Filled 2016-01-15 (×2): qty 1

## 2016-01-15 MED ORDER — PANTOPRAZOLE SODIUM 40 MG PO TBEC
40.0000 mg | DELAYED_RELEASE_TABLET | Freq: Every day | ORAL | Status: DC
  Administered 2016-01-15 – 2016-01-16 (×2): 40 mg via ORAL
  Filled 2016-01-15 (×2): qty 1

## 2016-01-15 MED ORDER — TIZANIDINE HCL 2 MG PO TABS
1.0000 mg | ORAL_TABLET | Freq: Three times a day (TID) | ORAL | Status: DC | PRN
  Filled 2016-01-15: qty 0.5

## 2016-01-15 MED ORDER — TRAMADOL HCL 50 MG PO TABS: 50.0000 mg | ORAL_TABLET | Freq: Four times a day (QID) | ORAL | Status: DC | PRN

## 2016-01-15 MED ORDER — ALPRAZOLAM 0.25 MG PO TABS
0.2500 mg | ORAL_TABLET | Freq: Every evening | ORAL | Status: DC | PRN
  Administered 2016-01-15: 0.25 mg via ORAL
  Filled 2016-01-15: qty 1

## 2016-01-15 MED ORDER — SODIUM CHLORIDE 0.9 % IV SOLN
Freq: Once | INTRAVENOUS | Status: AC
  Administered 2016-01-15: 03:00:00 via INTRAVENOUS

## 2016-01-15 MED ORDER — SODIUM CHLORIDE 0.9 % IJ SOLN
3.0000 mL | Freq: Two times a day (BID) | INTRAMUSCULAR | Status: DC
  Administered 2016-01-15 – 2016-01-16 (×2): 3 mL via INTRAVENOUS

## 2016-01-15 MED ORDER — ALBUTEROL SULFATE (2.5 MG/3ML) 0.083% IN NEBU: 2.5000 mg | INHALATION_SOLUTION | Freq: Four times a day (QID) | RESPIRATORY_TRACT | Status: DC | PRN

## 2016-01-15 MED ORDER — CALCIUM CARBONATE ANTACID 500 MG PO CHEW
CHEWABLE_TABLET | Freq: Every day | ORAL | Status: DC
  Administered 2016-01-16: 200 mg via ORAL
  Filled 2016-01-15 (×2): qty 1

## 2016-01-15 MED ORDER — ALBUTEROL SULFATE (2.5 MG/3ML) 0.083% IN NEBU
2.5000 mg | INHALATION_SOLUTION | Freq: Four times a day (QID) | RESPIRATORY_TRACT | Status: DC
  Administered 2016-01-15: 2.5 mg via RESPIRATORY_TRACT
  Filled 2016-01-15 (×2): qty 3

## 2016-01-15 MED ORDER — ESCITALOPRAM OXALATE 10 MG PO TABS
5.0000 mg | ORAL_TABLET | Freq: Every day | ORAL | Status: DC
  Administered 2016-01-16: 5 mg via ORAL
  Filled 2016-01-15 (×2): qty 1

## 2016-01-15 MED ORDER — DILTIAZEM HCL 25 MG/5ML IV SOLN
10.0000 mg | Freq: Once | INTRAVENOUS | Status: AC
  Administered 2016-01-15: 10 mg via INTRAVENOUS

## 2016-01-15 MED ORDER — POTASSIUM CHLORIDE CRYS ER 20 MEQ PO TBCR
20.0000 meq | EXTENDED_RELEASE_TABLET | Freq: Once | ORAL | Status: DC
Start: 2016-01-15 — End: 2016-01-16

## 2016-01-15 MED ORDER — DEXTROSE 5 % IV SOLN
5.0000 mg/h | INTRAVENOUS | Status: DC
  Administered 2016-01-15: 12.5 mg/h via INTRAVENOUS
  Administered 2016-01-15: 2.5 mg/h via INTRAVENOUS
  Administered 2016-01-15 (×2): 5 mg/h via INTRAVENOUS
  Administered 2016-01-15: 7.5 mg/h via INTRAVENOUS
  Administered 2016-01-15: 10 mg/h via INTRAVENOUS
  Filled 2016-01-15: qty 100

## 2016-01-15 MED ORDER — TRAMADOL HCL 50 MG PO TABS
50.0000 mg | ORAL_TABLET | Freq: Four times a day (QID) | ORAL | Status: DC
  Administered 2016-01-15 – 2016-01-16 (×5): 50 mg via ORAL
  Filled 2016-01-15 (×5): qty 1

## 2016-01-15 MED ORDER — MORPHINE SULFATE (PF) 2 MG/ML IV SOLN
INTRAVENOUS | Status: AC
  Administered 2016-01-15: 2 mg via INTRAVENOUS
  Filled 2016-01-15: qty 1

## 2016-01-15 MED ORDER — ONDANSETRON HCL 4 MG PO TABS: 4.0000 mg | ORAL_TABLET | Freq: Four times a day (QID) | ORAL | Status: DC | PRN

## 2016-01-15 MED ORDER — ONDANSETRON HCL 4 MG/2ML IJ SOLN: 4.0000 mg | Freq: Four times a day (QID) | INTRAMUSCULAR | Status: DC | PRN

## 2016-01-15 MED ORDER — DILTIAZEM HCL 25 MG/5ML IV SOLN
10.0000 mg | Freq: Once | INTRAVENOUS | Status: AC
  Administered 2016-01-15: 10 mg via INTRAVENOUS
  Filled 2016-01-15: qty 5

## 2016-01-15 NOTE — ED Notes (Signed)
Turned and repositioned patient onto left side.

## 2016-01-15 NOTE — ED Provider Notes (Signed)
[facility] Emergency Department Provider Note  ____________________________________________  Time seen: Approximately 2:16 AM  I have reviewed the triage vital signs and the nursing notes.   HISTORY  Chief Complaint Hip Pain    HPI Peggy [name] is a 82 y.o. female who presents to the ED from home with a chief complaint of right hip pain. Patient was seen in the ED 3 days ago for same. She had a CT scan performed and was diagnosed with osteoarthritis. She has not experienced recent falls since discharge from ED. Complains of increased pain today and is unable to ambulate. She took 2 tramadols and a Flexeril over the course of yesterday evening without relief of her symptoms.Denies recent fever, chills, chest pain, shortness of breath, abdominal pain, nausea, vomiting, diarrhea. Scheduled to see orthopedics this week for treatment of chronic hip pain.   Past Medical History  Diagnosis Date  . Essential hypertension, benign   . Arthritis   . Heart disease 2001  . Hypertension 1996  . Stroke [facility]) 2009  . Heart attack ([place]) 2001  . Unspecified essential hypertension   . Hearing disorder 2012    hearing aids  . Reflux     acid reflux  . Breast screening, unspecified   . Lump or mass in breast   . Special screening for malignant neoplasms, colon   . Other benign neoplasm of connective and other soft tissue of thorax   . Personal history of malignant neoplasm of breast   . Atrial fibrillation ([place])   . Allergy   . Atrial fibrillation ([place])   . Atrial fibrillation with rapid ventricular response [facility]) November 2015    Heart rate up to 140 requiring IV Cardizem  . Breast cancer [facility]) 2012    Patient underwent a left mastectomy on 03-31-11. Pathology showed DCIS at both sites without an invasive component. Sentinel nodes were negative. The breast lesions were noted to be ER +40%, PR-positive, 5%.   . Malignant neoplasm of upper-inner quadrant of female  breast ([place])   . Malignant neoplasm of upper-outer quadrant of female breast ([place])   . Hip pain     Patient Active Problem List   Diagnosis Date Noted  . Memory loss 03/21/2015  . Abnormality of gait 03/21/2015  . Chest pain 03/11/2015  . Atrial fibrillation with RVR ([place]) 03/11/2015  . H/O: CVA (cerebrovascular accident) 03/11/2015  . 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 Blurred vision 09/05/2013  . 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 EPIGASTRIC PAIN 02/14/2009  . NONSPECIFIC ABN FINDNG RAD&OTH EXAM BILARY TRCT 02/14/2009    Past Surgical History  Procedure Laterality Date  . Mastectomy  2012    left breast  . Breast biopsy      left breast biopsy over 15 years ago   . Upper gi endoscopy  2009    [facility] Dr. Donnella Sham  . Colonoscopy  2009    [facility] Dr. Donnella Sham  . Appendectomy      age 9  . Tonsillectomy      as a child  . Cholecystectomy      40 years ago   . Abdominal hysterectomy      age 55  . Small intestine surgery  12/31/2012    Abdominal exploration, lysis of adhesions for distal small bowel obstruction. Rochel Brome, MD    Current Outpatient Rx  Name  Route  Sig  Dispense  Refill  .  acetaminophen (TYLENOL) 325 MG tablet   Oral   Take 325 mg by mouth every 6 (six) hours as needed for mild pain.          Marland Kitchen albuterol (PROAIR HFA) 108 (90 BASE) MCG/ACT inhaler   Inhalation   Inhale into the lungs.         . ALPRAZolam (XANAX) 0.25 MG tablet   Oral   Take 0.25 mg by mouth at bedtime as needed for anxiety. For sleep         . aspirin 81 MG tablet   Oral   Take 81 mg by mouth daily.         Marland Kitchen CALCIUM PO   Oral   Take 1 tablet by mouth daily.          . diazepam (VALIUM) 2 MG tablet            0   . diltiazem (CARDIZEM CD) 240 MG 24 hr capsule   Oral   Take 240 mg by mouth daily.         Marland Kitchen escitalopram (LEXAPRO) 10 MG tablet   Oral   Take 5 mg by mouth  daily.          . Lansoprazole (PREVACID PO)   Oral   Take 1 tablet by mouth daily.          . metoprolol tartrate (LOPRESSOR) 25 MG tablet   Oral   Take 25 mg by mouth 2 (two) times daily.         . Multiple Vitamin (MULTI-VITAMINS) TABS   Oral   Take 1 tablet by mouth daily.          . nitroGLYCERIN (NITROSTAT) 0.4 MG SL tablet   Sublingual   Place 0.4 mg under the tongue every 5 (five) minutes as needed for chest pain. Every 5 minutes as needed         . pravastatin (PRAVACHOL) 20 MG tablet   Oral   Take 20 mg by mouth daily.         Marland Kitchen tiZANidine (ZANAFLEX) 2 MG tablet   Oral   Take 1 mg by mouth 3 (three) times daily as needed for muscle spasms.         . traMADol (ULTRAM) 50 MG tablet      Take 1-2 tablets by mouth every 6 hours as needed for moderate to severe pain   30 tablet   0   . triamcinolone (NASACORT) 55 MCG/ACT AERO nasal inhaler   Nasal   Place into the nose.         Marland Kitchen EXPIRED: warfarin (COUMADIN) 3 MG tablet   Oral   Take 3 mg by mouth daily.           Allergies Alendronate sodium; Amoxicillin-pot clavulanate; Etodolac; Ibandronate sodium; Iodine; Lactose intolerance (gi); Minocycline hcl; Risedronate sodium; Sulfonamide derivatives; and Zoledronic acid  Family History  Problem Relation Age of Onset  . Colon cancer Neg Hx   . Ovarian cancer Neg Hx   . Breast cancer Sister     x 2, in their 65's  . Lung cancer Brother   . Stroke Father   . Heart disease Father   . Heart disease Mother     Social History Social History  Substance Use Topics  . Smoking status: Never Smoker   . Smokeless tobacco: Never Used  . Alcohol Use: No    Review of Systems Constitutional: No fever/chills Eyes: No visual changes.  ENT: No sore throat. Cardiovascular: Denies chest pain. Respiratory: Denies shortness of breath. Gastrointestinal: No abdominal pain.  No nausea, no vomiting.  No diarrhea.  No constipation. Genitourinary: Negative  for dysuria. Musculoskeletal: Positive for right hip pain. Negative for back pain. Skin: Negative for rash. Neurological: Negative for headaches, focal weakness or numbness.  10-point ROS otherwise negative.  ____________________________________________   PHYSICAL EXAM:  VITAL SIGNS: ED Triage Vitals  Enc Vitals Group     BP 01/15/16 0056 141/76 mmHg     Pulse Rate 01/15/16 0056 80     Resp 01/15/16 0056 16     Temp 01/15/16 0056 98 F (36.7 C)     Temp Source 01/15/16 0056 Oral     SpO2 01/15/16 0056 99 %     Weight 01/15/16 0056 138 lb 14.4 oz (63.005 kg)     Height 01/15/16 0056 4\' 8"  (1.422 m)     Head Cir --      Peak Flow --      Pain Score 01/15/16 0057 10     Pain Loc --      Pain Edu? --      Excl. in [place]? --     Constitutional: Alert and oriented. Well appearing and in no acute distress. Eyes: Conjunctivae are normal. PERRL. EOMI. Head: Atraumatic. Nose: No congestion/rhinnorhea. Mouth/Throat: Mucous membranes are moist.  Oropharynx non-erythematous. Neck: No stridor.  No carotid bruits. Cardiovascular: Tachycardic, irregularly irregular rhythm. Grossly normal heart sounds.  Good peripheral circulation. Respiratory: Normal respiratory effort.  No retractions. Lungs CTAB. Gastrointestinal: Soft and nontender. No distention. No abdominal bruits. No CVA tenderness. Musculoskeletal: Pelvis stable. Right lateral and posterior hip moderately tender to palpation.  Limited range of motion secondary to pain. No joint effusions. Neurologic:  Normal speech and language. No gross focal neurologic deficits are appreciated.  Skin:  Skin is warm, dry and intact. No rash noted. Psychiatric: Mood and affect are normal. Speech and behavior are normal.  ____________________________________________   LABS (all labs ordered are listed, but only abnormal results are displayed)  Labs Reviewed  CBC WITH DIFFERENTIAL/PLATELET - Abnormal; Notable for the following:    WBC 12.2 (*)     Neutro Abs 6.8 (*)    Lymphs Abs 3.8 (*)    Monocytes Absolute 1.4 (*)    All other components within normal limits  COMPREHENSIVE METABOLIC PANEL - Abnormal; Notable for the following:    Glucose, Bld 111 (*)    All other components within normal limits  URINALYSIS COMPLETEWITH MICROSCOPIC ([facility] ONLY) - Abnormal; Notable for the following:    Color, Urine YELLOW (*)    APPearance CLEAR (*)    Leukocytes, UA TRACE (*)    Squamous Epithelial / LPF 0-5 (*)    All other components within normal limits  TROPONIN I  PROTIME-INR   ____________________________________________  EKG  ED ECG REPORT I, Suesan Mohrmann J, the attending physician, personally viewed and interpreted this ECG.   Date: 01/15/2016  EKG Time: 0228  Rate: 125  Rhythm: normal EKG, normal sinus rhythm  Axis: LAD  Intervals:none  ST&T Change: Nonspecific  ____________________________________________  RADIOLOGY  Portable chest x-ray (viewed by me, interpreted per Dr. Gerilyn Nestle): No active disease. ____________________________________________   PROCEDURES  Procedure(s) performed: None  Critical Care performed: Yes, see critical care note(s)   CRITICAL CARE Performed by: Paulette Blanch   Total critical care time: 45 minutes  Critical care time was exclusive of separately billable procedures and treating other patients.  Critical  care was necessary to treat or prevent imminent or life-threatening deterioration.  Critical care was time spent personally by me on the following activities: development of treatment plan with patient and/or surrogate as well as nursing, discussions with consultants, evaluation of patient's response to treatment, examination of patient, obtaining history from patient or surrogate, ordering and performing treatments and interventions, ordering and review of laboratory studies, ordering and review of radiographic studies, pulse oximetry and re-evaluation of patient's  condition.   ____________________________________________   INITIAL IMPRESSION / ASSESSMENT AND PLAN / ED COURSE  Pertinent labs & imaging results that were available during my care of the patient were reviewed by me and considered in my medical decision making (see chart for details).  82 year old female who presents with acute on chronic right hip pain. X-ray and CT imaging studies were negative on recent prior evaluation. Patient had low-dose morphine and is starting to feel some improvement in her hip pain.  ----------------------------------------- 2:25 AM on 01/15/2016 -----------------------------------------  Heart rate noted to be in the 160s. Patient has a history of atrial fibrillation on metoprolol, Cardizem and warfarin. Will obtain screening lab work and administer 10 mg IV diltiazem bolus.  ----------------------------------------- 2:51 AM on 01/15/2016 -----------------------------------------  Heart rate decreased to 110s but went back up to 140s. Patient had a transient episode of hypotension with systolic blood pressure in the upper 80s. With IV fluid resuscitation she is now 120s over 12s. Will administer another dose of Cardizem.  ----------------------------------------- 3:36 AM on 01/15/2016 -----------------------------------------  Heart rate remains in the 130s to 140s. At this time I will initiate a Cardizem drip. Discussed with hospitalist to evaluate in the emergency department for admission. ____________________________________________   FINAL CLINICAL IMPRESSION(S) / ED DIAGNOSES  Final diagnoses:  Atrial fibrillation with RVR ([place])  Hip pain, right  Osteoarthritis of right knee, unspecified osteoarthritis type      Paulette Blanch, MD 01/15/16 [phone]

## 2016-01-15 NOTE — Progress Notes (Signed)
Patient ID: Peggy [name], female   DOB: 1928/02/03, 82 y.o.   MRN: [id number] [facility] PROGRESS NOTE  GAYANE TUGMAN [id number] DOB: 01-Jan-1928 DOA: 01/15/2016 PCP: Maryland Pink, MD  HPI/Subjective: Patient feeling right hip pain, Been having right hip pain for months and has received steoid injections previously. Last few days severe pain.  Normally walks without a device and she lives alone.  In ER, found to be in rapid afib.  Objective: Filed Vitals:   01/15/16 1100 01/15/16 1158  BP: 147/80 131/63  Pulse:  71  Temp:  98.5 F (36.9 C)  Resp: 21 18    Filed Weights   01/15/16 0056 01/15/16 1158  Weight: 63.005 kg (138 lb 14.4 oz) 58.741 kg (129 lb 8 oz)    ROS: Review of Systems  Constitutional: Negative for fever and chills.  Eyes: Negative for blurred vision.  Respiratory: Negative for cough and shortness of breath.   Cardiovascular: Negative for chest pain.  Gastrointestinal: Negative for nausea, vomiting, abdominal pain, diarrhea and constipation.  Genitourinary: Negative for dysuria.  Musculoskeletal: Positive for joint pain.  Neurological: Negative for dizziness and headaches.   Exam: Physical Exam  HENT:  Nose: No mucosal edema.  Mouth/Throat: No oropharyngeal exudate or posterior oropharyngeal edema.  Eyes: Conjunctivae, EOM and lids are normal. Pupils are equal, round, and reactive to light.  Neck: No JVD present. Carotid bruit is not present. No edema present. No thyroid mass and no thyromegaly present.  Cardiovascular: S1 normal and S2 normal.  Exam reveals no gallop.   Murmur heard.  Systolic murmur is present with a grade of 3/6  Pulses:      Dorsalis pedis pulses are 2+ on the right side, and 2+ on the left side.  Respiratory: No respiratory distress. She has no wheezes. She has no rhonchi. She has no rales.  GI: Soft. Bowel sounds are normal. There is no tenderness.  Musculoskeletal:       Right hip: She exhibits decreased  range of motion and tenderness.       Right ankle: She exhibits swelling.       Left ankle: She exhibits swelling.  Lymphadenopathy:    She has no cervical adenopathy.  Neurological: She is alert. No cranial nerve deficit.  Patient unable to lift right leg up off the bed  Skin: Skin is warm. No rash noted. Nails show no clubbing.  Psychiatric: She has a normal mood and affect.      Data Reviewed: Basic Metabolic Panel:  Recent Labs Lab 01/15/16 0222 01/15/16 1213  NA 138 140  K 3.8 4.5  CL 106 109  CO2 26 23  GLUCOSE 111* 105*  BUN 19 12  CREATININE 0.76 0.65  CALCIUM 9.4 8.7*   Liver Function Tests:  Recent Labs Lab 01/15/16 0222  AST 33  ALT 20  ALKPHOS 88  BILITOT 0.8  PROT 7.0  ALBUMIN 3.8   CBC:  Recent Labs Lab 01/15/16 0222  WBC 12.2*  NEUTROABS 6.8*  HGB 13.5  HCT 40.2  MCV 96.7  PLT 281   Cardiac Enzymes:  Recent Labs Lab 01/15/16 0222 01/15/16 1213  TROPONINI <0.03 0.05*   BNP (last 3 results)  Recent Labs  03/11/15 1410  BNP 382.2*     Studies: Dg Chest Port 1 View  01/15/2016  CLINICAL DATA:  Atrial fibrillation with rapid ventricular response. History of hypertension, stroke, MI, atrial fibrillation, breast cancer. EXAM: PORTABLE CHEST 1 VIEW COMPARISON:  09/16/2015 FINDINGS: Borderline  heart size with normal pulmonary vascularity. No focal airspace disease or consolidation in the lungs. No blunting of costophrenic angles. No pneumothorax. Slight linear fibrosis in the left mid lung. Calcified and tortuous aorta. IMPRESSION: No active disease. Electronically Signed   By: Lucienne Capers M.D.   On: 01/15/2016 03:05    Scheduled Meds: . albuterol  2.5 mg Nebulization Q6H  . aspirin  81 mg Oral Daily  . calcium carbonate   Oral Daily  . diltiazem  240 mg Oral Daily  . escitalopram  5 mg Oral Daily  . metoprolol tartrate  25 mg Oral BID  . multivitamin with minerals  1 tablet Oral Daily  . pantoprazole  40 mg Oral Daily  .  potassium chloride  20 mEq Oral Once  . pravastatin  20 mg Oral Daily  . predniSONE  10 mg Oral Q breakfast  . sodium chloride  3 mL Intravenous Q12H  . sodium chloride  3 mL Intravenous Q12H  . traMADol  50 mg Oral 4 times per day    Assessment/Plan:  1. Atrial fibrillation with rapid ventricular response. Initially the patient was started on Cardizem IV drip. We were able to give her oral metoprolol and her oral Cardizem and get her off the drip. By the time she got up to telemetry She converted to normal sinus rhythm. Patient's Coumadin is supratherapeutic and Coumadin will be held tonight. 2. Right hip pain- CT scan of the hip on 1/21 shows no acute fracture and small right hip joint effusion. By exam, she has a bursitis. Orthopedic consultation. Physical therapy consultation. Start low-dose prednisone and continue tramadol for pain control. 3. Hyperlipidemia unspecified continue pravastatin 4. Gastroesophageal reflux disease without esophagitis on Protonix 5. History of breast cancer 6. History of arthritis  Code Status:     Code Status Orders        Start     Ordered   01/15/16 1135  Full code   Continuous     01/15/16 1134    Code Status History    Date Active Date Inactive Code Status Order ID Comments User Context   03/11/2015  5:33 PM 03/12/2015  6:42 PM Full Code [id number]  Jonetta Osgood, MD Inpatient     Family Communication: Daughter at bedside Disposition Plan: Home versus rehabilitation depending on how she does with physical therapy  Consultants:  Orthopedic surgery  Time spent: 20 minutes  Loletha Grayer  [facility]

## 2016-01-15 NOTE — Progress Notes (Signed)
Dr. Earleen Newport paged to make aware of 0.05 troponin

## 2016-01-15 NOTE — ED Notes (Signed)
Pt converted to SR. vss .

## 2016-01-15 NOTE — Consult Note (Signed)
ANTICOAGULATION CONSULT NOTE - Initial Consult  Pharmacy Consult for warfarin Indication: atrial fibrillation  Allergies  Allergen Reactions  . Alendronate Sodium     REACTION: questionable  . Amoxicillin-Pot Clavulanate   . Etodolac   . Ibandronate Sodium     REACTION: questionable  . Iodine     Other reaction(s): Unknown  . Lactose Intolerance (Gi) Other (See Comments)    GI problems  . Minocycline Hcl   . Risedronate Sodium   . Sulfonamide Derivatives Other (See Comments)    Stomach pain  . Zoledronic Acid     REACTION: "dental problems"    Patient Measurements: Height: 4\' 8"  (142.2 cm) Weight: 129 lb 8 oz (58.741 kg) IBW/kg (Calculated) : 36.3 Heparin Dosing Weight:   Vital Signs: Temp: 98.5 F (36.9 C) (01/24 1158) BP: 131/63 mmHg (01/24 1158) Pulse Rate: 71 (01/24 1158)  Labs:  Recent Labs  01/15/16 0222 01/15/16 1213  HGB 13.5  --   HCT 40.2  --   PLT 281  --   LABPROT 41.2* 38.8*  INR 4.45* 4.14*  CREATININE 0.76 0.65  TROPONINI <0.03 0.05*    Estimated Creatinine Clearance: 36.8 mL/min (by C-G formula based on Cr of 0.65).   Medical History: Past Medical History  Diagnosis Date  . Essential hypertension, benign   . Arthritis   . Heart disease 2001  . Hypertension 1996  . Stroke [facility]) 2009  . Heart attack ([place]) 2001  . Unspecified essential hypertension   . Hearing disorder 2012    hearing aids  . Reflux     acid reflux  . Breast screening, unspecified   . Lump or mass in breast   . Special screening for malignant neoplasms, colon   . Other benign neoplasm of connective and other soft tissue of thorax   . Personal history of malignant neoplasm of breast   . Atrial fibrillation ([place])   . Allergy   . Atrial fibrillation ([place])   . Atrial fibrillation with rapid ventricular response [facility]) November 2015    Heart rate up to 140 requiring IV Cardizem  . Breast cancer [facility]) 2012    Patient underwent a left mastectomy on 03-31-11. Pathology  showed DCIS at both sites without an invasive component. Sentinel nodes were negative. The breast lesions were noted to be ER +40%, PR-positive, 5%.   . Malignant neoplasm of upper-inner quadrant of female breast ([place])   . Malignant neoplasm of upper-outer quadrant of female breast ([place])   . Hip pain     Medications:  Scheduled:  . albuterol  2.5 mg Nebulization Q6H  . aspirin  81 mg Oral Daily  . calcium carbonate   Oral Daily  . diltiazem  240 mg Oral Daily  . escitalopram  5 mg Oral Daily  . metoprolol tartrate  25 mg Oral BID  . multivitamin with minerals  1 tablet Oral Daily  . pantoprazole  40 mg Oral Daily  . potassium chloride  20 mEq Oral Once  . pravastatin  20 mg Oral Daily  . predniSONE  10 mg Oral Q breakfast  . sodium chloride  3 mL Intravenous Q12H  . sodium chloride  3 mL Intravenous Q12H  . traMADol  50 mg Oral 4 times per day    Assessment: Pt is a 82 year old female on warfarin for Afib. Pt INR on admission was 4.45. Her home dose is 4mg  daily.   Goal of Therapy:  INR 2-3 Monitor platelets by anticoagulation protocol: Yes  Plan:  Hold warfarin until INR is therapeutic. Will recheck INR in the AM.  Melissa D Maccia 01/15/2016,3:50 PM

## 2016-01-15 NOTE — ED Notes (Addendum)
Waiting for cardizem drip to finish from pharm

## 2016-01-15 NOTE — H&P (Addendum)
[facility] [place] NAME: Frann Slutz    MR#:  [id number]  DATE OF BIRTH:  [birth year]  DATE OF ADMISSION:  01/15/2016  PRIMARY CARE PHYSICIAN: Maryland Pink, MD   REQUESTING/REFERRING PHYSICIAN:   CHIEF COMPLAINT:   Chief Complaint  Patient presents with  . Hip Pain    HISTORY OF PRESENT ILLNESS: Aedan Burford  is a 82 y.o. female with a known history of hypertension, arthritis,, CVA,, osteoarthritis,, GERD,, atrial fibrillation presented to the emergency room presented to the emergency room with pain in the right hip. No history of any fall. She does severe pain in the right hip which started since 2 days. Pain has been worst since last 1 day. is aching in nature 6 out of 10 on a scale of 1-10. Patient was given IV morphine in the emergency room and it relieved the pain. Patient has history of atrial fibrillation and when she presented to the emergency room her heart rate was around 140 bpm. IV Cardizem push was given but still the heart rate was persistently high. Patient Started on IV diltiazem drip in the ER.No history of fall or trauma to the hip.No complaints of chest pain or palpitations.  PAST MEDICAL HISTORY:   Past Medical History  Diagnosis Date  . Essential hypertension, benign   . Arthritis   . Heart disease 2001  . Hypertension 1996  . Stroke [facility]) 2009  . Heart attack ([place]) 2001  . Unspecified essential hypertension   . Hearing disorder 2012    hearing aids  . Reflux     acid reflux  . Breast screening, unspecified   . Lump or mass in breast   . Special screening for malignant neoplasms, colon   . Other benign neoplasm of connective and other soft tissue of thorax   . Personal history of malignant neoplasm of breast   . Atrial fibrillation ([place])   . Allergy   . Atrial fibrillation ([place])   . Atrial fibrillation with rapid ventricular response [facility]) November 2015    Heart rate up to 140 requiring IV Cardizem   . Breast cancer [facility]) 2012    Patient underwent a left mastectomy on 03-31-11. Pathology showed DCIS at both sites without an invasive component. Sentinel nodes were negative. The breast lesions were noted to be ER +40%, PR-positive, 5%.   . Malignant neoplasm of upper-inner quadrant of female breast ([place])   . Malignant neoplasm of upper-outer quadrant of female breast ([place])   . Hip pain     PAST SURGICAL HISTORY: Past Surgical History  Procedure Laterality Date  . Mastectomy  2012    left breast  . Breast biopsy      left breast biopsy over 15 years ago   . Upper gi endoscopy  2009    [facility] Dr. Donnella Sham  . Colonoscopy  2009    [facility] Dr. Donnella Sham  . Appendectomy      age 49  . Tonsillectomy      as a child  . Cholecystectomy      40 years ago   . Abdominal hysterectomy      age 72  . Small intestine surgery  12/31/2012    Abdominal exploration, lysis of adhesions for distal small bowel obstruction. Rochel Brome, MD    SOCIAL HISTORY:  Social History  Substance Use Topics  . Smoking status: Never Smoker   . Smokeless tobacco: Never Used  . Alcohol Use: No  FAMILY HISTORY:  Family History  Problem Relation Age of Onset  . Colon cancer Neg Hx   . Ovarian cancer Neg Hx   . Breast cancer Sister     x 2, in their 22's  . Lung cancer Brother   . Stroke Father   . Heart disease Father   . Heart disease Mother     DRUG ALLERGIES:  Allergies  Allergen Reactions  . Alendronate Sodium     REACTION: questionable  . Amoxicillin-Pot Clavulanate   . Etodolac   . Ibandronate Sodium     REACTION: questionable  . Iodine     Other reaction(s): Unknown  . Lactose Intolerance (Gi) Other (See Comments)    GI problems  . Minocycline Hcl   . Risedronate Sodium   . Sulfonamide Derivatives Other (See Comments)    Stomach pain  . Zoledronic Acid     REACTION: "dental problems"    REVIEW OF SYSTEMS:   CONSTITUTIONAL: No fever, fatigue or weakness.  EYES: No blurred or  double vision.  EARS, NOSE, AND THROAT: No tinnitus or ear pain.  RESPIRATORY: No cough, shortness of breath, wheezing or hemoptysis.  CARDIOVASCULAR: No chest pain, orthopnea, edema.  GASTROINTESTINAL: No nausea, vomiting, diarrhea or abdominal pain.  GENITOURINARY: No dysuria, hematuria.  ENDOCRINE: No polyuria, nocturia,  HEMATOLOGY: No anemia, easy bruising or bleeding SKIN: No rash or lesion. MUSCULOSKELETAL: Has right hip pain.   NEUROLOGIC: No tingling, numbness, weakness.  PSYCHIATRY: No anxiety or depression.   MEDICATIONS AT HOME:  Prior to Admission medications   Medication Sig Start Date End Date Taking? Authorizing Provider  acetaminophen (TYLENOL) 325 MG tablet Take 325 mg by mouth every 6 (six) hours as needed for mild pain.     Historical Provider, MD  albuterol (PROAIR HFA) 108 (90 BASE) MCG/ACT inhaler Inhale into the lungs. 07/07/15 07/06/16  Historical Provider, MD  ALPRAZolam Duanne Moron) 0.25 MG tablet Take 0.25 mg by mouth at bedtime as needed for anxiety. For sleep 11/10/14   Historical Provider, MD  aspirin 81 MG tablet Take 81 mg by mouth daily.    Historical Provider, MD  CALCIUM PO Take 1 tablet by mouth daily.     Historical Provider, MD  diazepam (VALIUM) 2 MG tablet  02/22/15   Historical Provider, MD  diltiazem (CARDIZEM CD) 240 MG 24 hr capsule Take 240 mg by mouth daily. 02/16/15   Historical Provider, MD  escitalopram (LEXAPRO) 10 MG tablet Take 5 mg by mouth daily.     Historical Provider, MD  Lansoprazole (PREVACID PO) Take 1 tablet by mouth daily.     Historical Provider, MD  metoprolol tartrate (LOPRESSOR) 25 MG tablet Take 25 mg by mouth 2 (two) times daily. 02/26/15   Historical Provider, MD  Multiple Vitamin (MULTI-VITAMINS) TABS Take 1 tablet by mouth daily.     Historical Provider, MD  nitroGLYCERIN (NITROSTAT) 0.4 MG SL tablet Place 0.4 mg under the tongue every 5 (five) minutes as needed for chest pain. Every 5 minutes as needed 10/02/14   Historical  Provider, MD  pravastatin (PRAVACHOL) 20 MG tablet Take 20 mg by mouth daily.    Historical Provider, MD  tiZANidine (ZANAFLEX) 2 MG tablet Take 1 mg by mouth 3 (three) times daily as needed for muscle spasms.    Historical Provider, MD  traMADol (ULTRAM) 50 MG tablet Take 1-2 tablets by mouth every 6 hours as needed for moderate to severe pain 01/12/16   Hinda Kehr, MD  triamcinolone (NASACORT) 77  MCG/ACT AERO nasal inhaler Place into the nose. 05/07/15 05/06/16  Historical Provider, MD  warfarin (COUMADIN) 3 MG tablet Take 3 mg by mouth daily. 01/04/15 01/04/16  Historical Provider, MD      PHYSICAL EXAMINATION:   VITAL SIGNS: Blood pressure 136/85, pulse 61, temperature 98 F (36.7 C), temperature source Oral, resp. rate 14, height 4\' 8"  (1.422 m), weight 63.005 kg (138 lb 14.4 oz), SpO2 90 %.  GENERAL:  82 y.o.-year-old patient lying in the bed with no acute distress.  EYES: Pupils equal, round, reactive to light and accommodation. No scleral icterus. Extraocular muscles intact.  HEENT: Head atraumatic, normocephalic. Oropharynx and nasopharynx clear.  NECK:  Supple, no jugular venous distention. No thyroid enlargement, no tenderness.  LUNGS: Normal breath sounds bilaterally, no wheezing, rales,rhonchi or crepitation. No use of accessory muscles of respiration.  CARDIOVASCULAR: S1, S2 irregular. No murmurs, rubs, or gallops.  ABDOMEN: Soft, nontender, nondistended. Bowel sounds present. No organomegaly or mass.  EXTREMITIES: No pedal edema, cyanosis, or clubbing. Tenderness noted on the right hip. NEUROLOGIC: Cranial nerves II through XII are intact. Muscle strength 5/5 in all extremities. Sensation intact. No cerebellar signs noted. PSYCHIATRIC: The patient is alert and oriented x 3.  SKIN: No obvious rash, lesion, or ulcer.   LABORATORY PANEL:   CBC  Recent Labs Lab 01/15/16 0222  WBC 12.2*  HGB 13.5  HCT 40.2  PLT 281  MCV 96.7  MCH 32.3  MCHC 33.5  RDW 13.2  LYMPHSABS  3.8*  MONOABS 1.4*  EOSABS 0.1  BASOSABS 0.1   ------------------------------------------------------------------------------------------------------------------  Chemistries   Recent Labs Lab 01/15/16 0222  NA 138  K 3.8  CL 106  CO2 26  GLUCOSE 111*  BUN 19  CREATININE 0.76  CALCIUM 9.4  AST 33  ALT 20  ALKPHOS 88  BILITOT 0.8   ------------------------------------------------------------------------------------------------------------------ estimated creatinine clearance is 38.1 mL/min (by C-G formula based on Cr of 0.76). ------------------------------------------------------------------------------------------------------------------ No results for input(s): TSH, T4TOTAL, T3FREE, THYROIDAB in the last 72 hours.  Invalid input(s): FREET3   Coagulation profile  Recent Labs Lab 01/15/16 0222  INR 4.45*   ------------------------------------------------------------------------------------------------------------------- No results for input(s): DDIMER in the last 72 hours. -------------------------------------------------------------------------------------------------------------------  Cardiac Enzymes  Recent Labs Lab 01/15/16 0222  TROPONINI <0.03   ------------------------------------------------------------------------------------------------------------------ Invalid input(s): POCBNP  ---------------------------------------------------------------------------------------------------------------  Urinalysis    Component Value Date/Time   COLORURINE YELLOW* 01/15/2016 0222   COLORURINE Yellow 10/23/2014 1605   APPEARANCEUR CLEAR* 01/15/2016 0222   APPEARANCEUR Clear 10/23/2014 1605   LABSPEC 1.020 01/15/2016 0222   LABSPEC 1.011 10/23/2014 1605   PHURINE 5.0 01/15/2016 0222   PHURINE 6.0 10/23/2014 1605   GLUCOSEU NEGATIVE 01/15/2016 0222   GLUCOSEU Negative 10/23/2014 1605   HGBUR NEGATIVE 01/15/2016 0222   HGBUR Negative 10/23/2014 1605    BILIRUBINUR NEGATIVE 01/15/2016 0222   BILIRUBINUR Negative 10/23/2014 1605   KETONESUR NEGATIVE 01/15/2016 0222   KETONESUR Negative 10/23/2014 1605   PROTEINUR NEGATIVE 01/15/2016 0222   PROTEINUR Negative 10/23/2014 1605   UROBILINOGEN 0.2 03/11/2015 1430   NITRITE NEGATIVE 01/15/2016 0222   NITRITE Negative 10/23/2014 1605   LEUKOCYTESUR TRACE* 01/15/2016 0222   LEUKOCYTESUR Negative 10/23/2014 1605     RADIOLOGY: Dg Chest Port 1 View  01/15/2016  CLINICAL DATA:  Atrial fibrillation with rapid ventricular response. History of hypertension, stroke, MI, atrial fibrillation, breast cancer. EXAM: PORTABLE CHEST 1 VIEW COMPARISON:  09/16/2015 FINDINGS: Borderline heart size with normal pulmonary vascularity. No focal airspace disease or consolidation in the lungs.  No blunting of costophrenic angles. No pneumothorax. Slight linear fibrosis in the left mid lung. Calcified and tortuous aorta. IMPRESSION: No active disease. Electronically Signed   By: Lucienne Capers M.D.   On: 01/15/2016 03:05    EKG: Orders placed or performed during the hospital encounter of 09/16/15  . EKG 12-Lead  . EKG 12-Lead  . EKG 12-Lead  . EKG 12-Lead  . EKG    IMPRESSION AND PLAN: 82 year old elderly female patient with history of hypertension, osteoarthritis, atrial fibrillation, CVA resented to the emergency room for a pain in the right hip. Patient was found to be having atrial fibrillation with rapid rate. CT of the hip on 21 st did not show any fracture.  Admitting diagnosis 1. Atrial fibrillation with rapid rate 2. Right hip pain secondary to osteoarthritis 3. Hypertension 4. Osteoarthritis 5. Hypokalemia  Treatment plan : Admit patient to telemetry Start patient on IV Cardizem drip for rate control Replace potassium Pain management with IV morphine Resume home medications Supportive care.  All the records are reviewed and case discussed with ED provider. Management plans discussed with  the patient, family and they are in agreement.  CODE STATUS:FULL Code Status History    Date Active Date Inactive Code Status Order ID Comments User Context   03/11/2015  5:33 PM 03/12/2015  6:42 PM Full Code [id number]  Jonetta Osgood, MD Inpatient       TOTAL TIME TAKING CARE OF THIS PATIENT: 52 minutes.    Saundra Shelling M.D on 01/15/2016 at 4:10 AM  Between 7am to 6pm - Pager - [phone]  After 6pm go to [email or web address] - password [email or web address] [facility]  [facility]  Office  [phone]  CC: Primary care physician; Maryland Pink, MD

## 2016-01-15 NOTE — ED Notes (Signed)
Pt had skin tear on right forearm due to tegaderm, IV had to be discontinued, RN cleaned and covered tear with tefla

## 2016-01-15 NOTE — ED Notes (Signed)
C/O right hip pain.  Seen in ED on Friday for same.  CT Scan done and diagnosed with Osteoarthritis.  Patient states pain initially improved after Friday's visit, but worsened yesterday.  Patient states she has taken 2 pain pills (one at 1500 and a second at 1600) and a flexeril as well.  No improvement.

## 2016-01-15 NOTE — ED Notes (Signed)
Talked to Dr Bobbye Charleston about pts HR 70's and BP 87/63. Had been titrating cardizem gtt down from 12.5 to 5. md ordered fluid bolus,

## 2016-01-15 NOTE — ED Notes (Signed)
Critical INR 4.45 called from lab

## 2016-01-15 NOTE — Consult Note (Signed)
ORTHOPAEDIC CONSULTATION  PATIENT NAME: Peggy [name] DOB: 1928/09/25  MRN: [id number]  REQUESTING PHYSICIAN: Loletha Grayer, MD  Chief Complaint: Right hip and leg pain  HPI: Peggy [name] is a 82 y.o. female who complains of  right hip and groin pain as well as pain that extends from the lower back and buttocks area down the lateral aspect of the right lower extremity to the foot and ankle. The patient has a long history of right hip and groin pain as well as lumbar radiculitis. She has received multiple intra-articular corticosteroid injections to the right hip as well as injections to the greater trochanter and epidural steroid injections. She most recently received a right intra-articular corticosteroid hip injection on 12/06/2015 by Dr. Leanord Hawking. 4 days ago she was having some increased right hip pain and presented to [facility] emergency department at which time a CT scan of the right hip was obtained. She was apparently discharged with a prescription for tramadol. She had some initial improvement of her pain but then presented again last night with complaints of increasing right hip pain as well as pain extending down the lateral aspect of the right leg. She noted that the pain increased after she was reaching to hang wet jeans up to dry. She describes a sharp "grabbing" pain to the right groin as well as episodic severe pain extending down the leg. She can differentiate between the 2 types of pain. She denies any gross numbness or weakness. She denies any bowel or bladder dysfunction. Prior to this admission she was ambulating without any ambulatory aids.  Of note, the patient states that the pain has improved somewhat since her admission. She was apparently started on low-dose prednisone earlier today as per Dr. Leslye Peer. She is unable to tolerate nonsteroidal anti-inflammatory medications due to anticoagulation with Coumadin.  Past Medical History  Diagnosis Date   . Essential hypertension, benign   . Arthritis   . Heart disease 2001  . Hypertension 1996  . Stroke [facility]) 2009  . Heart attack ([place]) 2001  . Unspecified essential hypertension   . Hearing disorder 2012    hearing aids  . Reflux     acid reflux  . Breast screening, unspecified   . Lump or mass in breast   . Special screening for malignant neoplasms, colon   . Other benign neoplasm of connective and other soft tissue of thorax   . Personal history of malignant neoplasm of breast   . Atrial fibrillation ([place])   . Allergy   . Atrial fibrillation ([place])   . Atrial fibrillation with rapid ventricular response [facility]) November 2015    Heart rate up to 140 requiring IV Cardizem  . Breast cancer [facility]) 2012    Patient underwent a left mastectomy on 03-31-11. Pathology showed DCIS at both sites without an invasive component. Sentinel nodes were negative. The breast lesions were noted to be ER +40%, PR-positive, 5%.   . Malignant neoplasm of upper-inner quadrant of female breast ([place])   . Malignant neoplasm of upper-outer quadrant of female breast ([place])   . Hip pain    Past Surgical History  Procedure Laterality Date  . Mastectomy  2012    left breast  . Breast biopsy      left breast biopsy over 15 years ago   . Upper gi endoscopy  2009    [facility] Dr. Donnella Sham  . Colonoscopy  2009    [facility] Dr. Donnella Sham  . Appendectomy  age 74  . Tonsillectomy      as a child  . Cholecystectomy      40 years ago   . Abdominal hysterectomy      age 27  . Small intestine surgery  12/31/2012    Abdominal exploration, lysis of adhesions for distal small bowel obstruction. Rochel Brome, MD   Social History   Social History  . Marital Status: Widowed    Spouse Name: N/A  . Number of Children: 2  . Years of Education: 8   Occupational History  . Retired    Social History Main Topics  . Smoking status: Never Smoker   . Smokeless tobacco: Never Used  . Alcohol Use: No  . Drug Use: No  . Sexual  Activity: Not Asked   Other Topics Concern  . None   Social History Narrative   Patient lives at home alone.    Patient has 2 children.    Patient has a 8th education.    Patient is widowed.    Patient is right handed.    Patient is retired.    Family History  Problem Relation Age of Onset  . Colon cancer Neg Hx   . Ovarian cancer Neg Hx   . Breast cancer Sister     x 2, in their 9's  . Lung cancer Brother   . Stroke Father   . Heart disease Father   . Heart disease Mother    Allergies  Allergen Reactions  . Alendronate Sodium     REACTION: questionable  . Amoxicillin-Pot Clavulanate   . Etodolac   . Ibandronate Sodium     REACTION: questionable  . Iodine     Other reaction(s): Unknown  . Lactose Intolerance (Gi) Other (See Comments)    GI problems  . Minocycline Hcl   . Risedronate Sodium   . Sulfonamide Derivatives Other (See Comments)    Stomach pain  . Zoledronic Acid     REACTION: "dental problems"   Prior to Admission medications   Medication Sig Start Date End Date Taking? Authorizing Provider  acetaminophen (TYLENOL) 325 MG tablet Take 325 mg by mouth every 6 (six) hours as needed for mild pain.    Yes Historical Provider, MD  albuterol (PROAIR HFA) 108 (90 BASE) MCG/ACT inhaler Inhale into the lungs. 07/07/15 07/06/16 Yes Historical Provider, MD  ALPRAZolam Duanne Moron) 0.25 MG tablet Take 0.25 mg by mouth at bedtime as needed for anxiety. For sleep 11/10/14  Yes Historical Provider, MD  CALCIUM PO Take 1 tablet by mouth daily.    Yes Historical Provider, MD  diltiazem (CARDIZEM CD) 240 MG 24 hr capsule Take 240 mg by mouth daily. 02/16/15  Yes Historical Provider, MD  escitalopram (LEXAPRO) 10 MG tablet Take 10 mg by mouth daily.   Yes Historical Provider, MD  metoprolol (LOPRESSOR) 50 MG tablet Take 50 mg by mouth 2 (two) times daily.   Yes Historical Provider, MD  nitroGLYCERIN (NITROSTAT) 0.4 MG SL tablet Place 0.4 mg under the tongue every 5 (five) minutes  as needed for chest pain. Every 5 minutes as needed 10/02/14  Yes Historical Provider, MD  traMADol (ULTRAM) 50 MG tablet Take 1-2 tablets by mouth every 6 hours as needed for moderate to severe pain 01/12/16  Yes Hinda Kehr, MD  warfarin (COUMADIN) 4 MG tablet Take 4 mg by mouth daily. Patient states she takes a 3mg  and 1 mg tablet daily for a total of 4mg  every evening.   Yes Historical  Provider, MD   Positive ROS: All other systems have been reviewed and were otherwise negative with the exception of those mentioned in the HPI and as above.  Physical Exam: General: Alert and alert in no acute distress. HEENT: Atraumatic and normocephalic. Sclera are clear. Extraocular motion is intact. Oropharynx is clear with moist mucosa. Neck: Supple, nontender, good range of motion. No JVD or carotid bruits. Lungs: Clear to auscultation bilaterally. Cardiovascular: Regular rate and rhythm with normal S1 and S2. 2-3/6 murmur. No gallops or rubs. Pedal pulses are palpable bilaterally. Homans test is negative bilaterally. No significant pretibial or ankle edema. Abdomen: Soft, nontender, and nondistended. Bowel sounds are present. Skin: No lesions in the area of chief complaint Neurologic: Awake, alert, and oriented. Sensory function is grossly intact. Motor strength is felt to be 5 over 5 bilaterally. No clonus or tremor. Good motor coordination. Lymphatic: No axillary or cervical lymphadenopathy  MUSCULOSKELETAL:  Examination of the spine demonstrates some tenderness to palpation along the lower lumbar musculature. Slight scoliosis is appreciated. No gross paraspinous spasm. Attempted straight leg raise does reproduce the pain radiating down the lateral aspect of the right leg. Examination of the right lower extremity demonstrates no gross tenderness to palpation about the right knee or ankle. No significant soft tissue swelling. Gentle range of motion of the ankle and knee are well-tolerated pain is  elicited with log rolling of the right hip as well as attempts at active or passive flexion or abduction. No appreciable crepitance.  Assessment: Degenerative arthrosis of the right hip Lumbar radiculitis  Plan: The findings were discussed in detail with the patient. I discussed the probability that her pain was related to a combination of degenerative changes of the hip as well as radicular symptoms related to lumbar pathology. I had reviewed previous radiographs of the right hip from March 10, 26 which demonstrated reasonably good preservation of the cartilage space. I also reviewed the CT scan of the right hip dated 01/11/2016. The CT scan did demonstrate some subchondral cyst formation but did not demonstrate any avascular necrosis or collapse of the femoral head. There was no evidence of acute fracture appreciated.  I would agree with the trial of low-dose prednisone. She may benefit from physical therapy to work on transfers and gait training. Depending upon her improvement, it may be prudent to obtain a new MRI of the lumbar spine to evaluate for any acute changes.  James P. Holley Bouche M.D.

## 2016-01-15 NOTE — Discharge Instructions (Addendum)
Information on my medicine - Coumadin   (Warfarin)   Why was Coumadin prescribed for you? Coumadin was prescribed for you because you have a blood clot or a medical condition that can cause an increased risk of forming blood clots. Blood clots can cause serious health problems by blocking the flow of blood to the heart, lung, or brain. Coumadin can prevent harmful blood clots from forming. As a reminder your indication for Coumadin is:   Stroke Prevention Because Of Atrial Fibrillation  What test will check on my response to Coumadin? While on Coumadin (warfarin) you will need to have an INR test regularly to ensure that your dose is keeping you in the desired range. The INR (international normalized ratio) number is calculated from the result of the laboratory test called prothrombin time (PT).  If an INR APPOINTMENT HAS NOT ALREADY BEEN MADE FOR YOU please schedule an appointment to have this lab work done by your health care provider within 7 days. Your INR goal is usually a number between:  2 to 3 or your provider may give you a more narrow range like 2-2.5.  Ask your health care provider during an office visit what your goal INR is.  What  do you need to  know  About  COUMADIN? Take Coumadin (warfarin) exactly as prescribed by your healthcare provider about the same time each day.  DO NOT stop taking without talking to the doctor who prescribed the medication.  Stopping without other blood clot prevention medication to take the place of Coumadin may increase your risk of developing a new clot or stroke.  Get refills before you run out.  What do you do if you miss a dose? If you miss a dose, take it as soon as you remember on the same day then continue your regularly scheduled regimen the next day.  Do not take two doses of Coumadin at the same time.  Important Safety Information A possible side effect of Coumadin (Warfarin) is an increased risk of bleeding. You should call your healthcare  provider right away if you experience any of the following: ? Bleeding from an injury or your nose that does not stop. ? Unusual colored urine (red or dark brown) or unusual colored stools (red or black). ? Unusual bruising for unknown reasons. ? A serious fall or if you hit your head (even if there is no bleeding).  Some foods or medicines interact with Coumadin (warfarin) and might alter your response to warfarin. To help avoid this: ? Eat a balanced diet, maintaining a consistent amount of Vitamin K. ? Notify your provider about major diet changes you plan to make. ? Avoid alcohol or limit your intake to 1 drink for women and 2 drinks for men per day. (1 drink is 5 oz. wine, 12 oz. beer, or 1.5 oz. liquor.)  Make sure that ANY health care provider who prescribes medication for you knows that you are taking Coumadin (warfarin).  Also make sure the healthcare provider who is monitoring your Coumadin knows when you have started a new medication including herbals and non-prescription products.  Coumadin (Warfarin)  Major Drug Interactions  Increased Warfarin Effect Decreased Warfarin Effect  Alcohol (large quantities) Antibiotics (esp. Septra/Bactrim, Flagyl, Cipro) Amiodarone (Cordarone) Aspirin (ASA) Cimetidine (Tagamet) Megestrol (Megace) NSAIDs (ibuprofen, naproxen, etc.) Piroxicam (Feldene) Propafenone (Rythmol SR) Propranolol (Inderal) Isoniazid (INH) Posaconazole (Noxafil) Barbiturates (Phenobarbital) Carbamazepine (Tegretol) Chlordiazepoxide (Librium) Cholestyramine (Questran) Griseofulvin Oral Contraceptives Rifampin Sucralfate (Carafate) Vitamin K   Coumadin (Warfarin) Major  Herbal Interactions  Increased Warfarin Effect Decreased Warfarin Effect  Garlic Ginseng Ginkgo biloba Coenzyme Q10 Green tea St. Johns wort    Coumadin (Warfarin) FOOD Interactions  Eat a consistent number of servings per week of foods HIGH in Vitamin K (1 serving =  cup)  Collards  (cooked, or boiled & drained) Kale (cooked, or boiled & drained) Mustard greens (cooked, or boiled & drained) Parsley *serving size only =  cup Spinach (cooked, or boiled & drained) Swiss chard (cooked, or boiled & drained) Turnip greens (cooked, or boiled & drained)  Eat a consistent number of servings per week of foods MEDIUM-HIGH in Vitamin K (1 serving = 1 cup)  Asparagus (cooked, or boiled & drained) Broccoli (cooked, boiled & drained, or raw & chopped) Brussel sprouts (cooked, or boiled & drained) *serving size only =  cup Lettuce, raw (green leaf, endive, romaine) Spinach, raw Turnip greens, raw & chopped   These websites have more information on Coumadin (warfarin):  [employer]se; VeganReport.com.au;    Arthritis Arthritis is a term that is commonly used to refer to joint pain or joint disease. There are more than 100 types of arthritis. CAUSES The most common cause of this condition is wear and tear of a joint. Other causes include:  Gout.  Inflammation of a joint.  An infection of a joint.  Sprains and other injuries near the joint.  A drug reaction or allergic reaction. In some cases, the cause may not be known. SYMPTOMS The main symptom of this condition is pain in the joint with movement. Other symptoms include:  Redness, swelling, or stiffness at a joint.  Warmth coming from the joint.  Fever.  Overall feeling of illness. DIAGNOSIS This condition may be diagnosed with a physical exam and tests, including:  Blood tests.  Urine tests.  Imaging tests, such as MRI, X-rays, or a CT scan. Sometimes, fluid is removed from a joint for testing. TREATMENT Treatment for this condition may involve:  Treatment of the cause, if it is known.  Rest.  Raising (elevating) the joint.  Applying cold or hot packs to the joint.  Medicines to improve symptoms and reduce inflammation.  Injections of a steroid such as cortisone into the  joint to help reduce pain and inflammation. Depending on the cause of your arthritis, you may need to make lifestyle changes to reduce stress on your joint. These changes may include exercising more and losing weight. HOME CARE INSTRUCTIONS Medicines  Take over-the-counter and prescription medicines only as told by your health care provider.  Do not take aspirin to relieve pain if gout is suspected. Activities  Rest your joint if told by your health care provider. Rest is important when your disease is active and your joint feels painful, swollen, or stiff.  Avoid activities that make the pain worse. It is important to balance activity with rest.  Exercise your joint regularly with range-of-motion exercises as told by your health care provider. Try doing low-impact exercise, such as:  Swimming.  Water aerobics.  Biking.  Walking. Joint Care  If your joint is swollen, keep it elevated if told by your health care provider.  If your joint feels stiff in the morning, try taking a warm shower.  If directed, apply heat to the joint. If you have diabetes, do not apply heat without permission from your health care provider.  Put a towel between the joint and the hot pack or heating pad.  Leave the heat on the area for 20-30  minutes.  If directed, apply ice to the joint:  Put ice in a plastic bag.  Place a towel between your skin and the bag.  Leave the ice on for 20 minutes, 2-3 times per day.  Keep all follow-up visits as told by your health care provider. This is important. SEEK MEDICAL CARE IF:  The pain gets worse.  You have a fever. SEEK IMMEDIATE MEDICAL CARE IF:  You develop severe joint pain, swelling, or redness.  Many joints become painful and swollen.  You develop severe back pain.  You develop severe weakness in your leg.  You cannot control your bladder or bowels.   This information is not intended to replace advice given to you by your health care  provider. Make sure you discuss any questions you have with your health care provider.   Document Released: 01/15/2005 Document Revised: 08/29/2015 Document Reviewed: 03/05/2015 Elsevier Interactive Patient Education 2016 [place] is a term that is commonly used to refer to joint pain or joint disease. There are more than 100 types of arthritis. CAUSES The most common cause of this condition is wear and tear of a joint. Other causes include:  Gout.  Inflammation of a joint.  An infection of a joint.  Sprains and other injuries near the joint.  A drug reaction or allergic reaction. In some cases, the cause may not be known. SYMPTOMS The main symptom of this condition is pain in the joint with movement. Other symptoms include:  Redness, swelling, or stiffness at a joint.  Warmth coming from the joint.  Fever.  Overall feeling of illness. DIAGNOSIS This condition may be diagnosed with a physical exam and tests, including:  Blood tests.  Urine tests.  Imaging tests, such as MRI, X-rays, or a CT scan. Sometimes, fluid is removed from a joint for testing. TREATMENT Treatment for this condition may involve:  Treatment of the cause, if it is known.  Rest.  Raising (elevating) the joint.  Applying cold or hot packs to the joint.  Medicines to improve symptoms and reduce inflammation.  Injections of a steroid such as cortisone into the joint to help reduce pain and inflammation. Depending on the cause of your arthritis, you may need to make lifestyle changes to reduce stress on your joint. These changes may include exercising more and losing weight. HOME CARE INSTRUCTIONS Medicines  Take over-the-counter and prescription medicines only as told by your health care provider.  Do not take aspirin to relieve pain if gout is suspected. Activities  Rest your joint if told by your health care provider. Rest is important when your disease is  active and your joint feels painful, swollen, or stiff.  Avoid activities that make the pain worse. It is important to balance activity with rest.  Exercise your joint regularly with range-of-motion exercises as told by your health care provider. Try doing low-impact exercise, such as:  Swimming.  Water aerobics.  Biking.  Walking. Joint Care  If your joint is swollen, keep it elevated if told by your health care provider.  If your joint feels stiff in the morning, try taking a warm shower.  If directed, apply heat to the joint. If you have diabetes, do not apply heat without permission from your health care provider.  Put a towel between the joint and the hot pack or heating pad.  Leave the heat on the area for 20-30 minutes.  If directed, apply ice to the joint:  Put ice in  a plastic bag.  Place a towel between your skin and the bag.  Leave the ice on for 20 minutes, 2-3 times per day.  Keep all follow-up visits as told by your health care provider. This is important. SEEK MEDICAL CARE IF:  The pain gets worse.  You have a fever. SEEK IMMEDIATE MEDICAL CARE IF:  You develop severe joint pain, swelling, or redness.  Many joints become painful and swollen.  You develop severe back pain.  You develop severe weakness in your leg.  You cannot control your bladder or bowels.   This information is not intended to replace advice given to you by your health care provider. Make sure you discuss any questions you have with your health care provider.   Document Released: 01/15/2005 Document Revised: 08/29/2015 Document Reviewed: 03/05/2015 Elsevier Interactive Patient Education 2016 [place] is a term that is commonly used to refer to joint pain or joint disease. There are more than 100 types of arthritis. CAUSES The most common cause of this condition is wear and tear of a joint. Other causes include:  Gout.  Inflammation of a  joint.  An infection of a joint.  Sprains and other injuries near the joint.  A drug reaction or allergic reaction. In some cases, the cause may not be known. SYMPTOMS The main symptom of this condition is pain in the joint with movement. Other symptoms include:  Redness, swelling, or stiffness at a joint.  Warmth coming from the joint.  Fever.  Overall feeling of illness. DIAGNOSIS This condition may be diagnosed with a physical exam and tests, including:  Blood tests.  Urine tests.  Imaging tests, such as MRI, X-rays, or a CT scan. Sometimes, fluid is removed from a joint for testing. TREATMENT Treatment for this condition may involve:  Treatment of the cause, if it is known.  Rest.  Raising (elevating) the joint.  Applying cold or hot packs to the joint.  Medicines to improve symptoms and reduce inflammation.  Injections of a steroid such as cortisone into the joint to help reduce pain and inflammation. Depending on the cause of your arthritis, you may need to make lifestyle changes to reduce stress on your joint. These changes may include exercising more and losing weight. HOME CARE INSTRUCTIONS Medicines  Take over-the-counter and prescription medicines only as told by your health care provider.  Do not take aspirin to relieve pain if gout is suspected. Activities  Rest your joint if told by your health care provider. Rest is important when your disease is active and your joint feels painful, swollen, or stiff.  Avoid activities that make the pain worse. It is important to balance activity with rest.  Exercise your joint regularly with range-of-motion exercises as told by your health care provider. Try doing low-impact exercise, such as:  Swimming.  Water aerobics.  Biking.  Walking. Joint Care  If your joint is swollen, keep it elevated if told by your health care provider.  If your joint feels stiff in the morning, try taking a warm  shower.  If directed, apply heat to the joint. If you have diabetes, do not apply heat without permission from your health care provider.  Put a towel between the joint and the hot pack or heating pad.  Leave the heat on the area for 20-30 minutes.  If directed, apply ice to the joint:  Put ice in a plastic bag.  Place a towel between your skin and the bag.  Leave the ice on for 20 minutes, 2-3 times per day.  Keep all follow-up visits as told by your health care provider. This is important. SEEK MEDICAL CARE IF:  The pain gets worse.  You have a fever. SEEK IMMEDIATE MEDICAL CARE IF:  You develop severe joint pain, swelling, or redness.  Many joints become painful and swollen.  You develop severe back pain.  You develop severe weakness in your leg.  You cannot control your bladder or bowels.   This information is not intended to replace advice given to you by your health care provider. Make sure you discuss any questions you have with your health care provider.   Document Released: 01/15/2005 Document Revised: 08/29/2015 Document Reviewed: 03/05/2015 Elsevier Interactive Patient Education [employer].

## 2016-01-15 NOTE — ED Notes (Signed)
Admitting DR at bedside  

## 2016-01-16 LAB — PROTIME-INR
INR: 2.79
PROTHROMBIN TIME: 29 s — AB (ref 11.4–15.0)

## 2016-01-16 LAB — TROPONIN I: Troponin I: 0.04 ng/mL — ABNORMAL HIGH (ref <0.031)

## 2016-01-16 MED ORDER — WARFARIN SODIUM 2.5 MG PO TABS: 2.5000 mg | ORAL_TABLET | Freq: Once | ORAL | Status: DC

## 2016-01-16 MED ORDER — PREDNISONE 10 MG PO TABS: ORAL_TABLET | ORAL | Status: DC

## 2016-01-16 MED ORDER — WARFARIN - PHARMACIST DOSING INPATIENT: Freq: Every day | Status: DC

## 2016-01-16 NOTE — Discharge Summary (Signed)
[place] [facility] [place] NAME: Peggy [name]    MR#:  [id number]  DATE OF BIRTH:  06-06-1928  DATE OF ADMISSION:  01/15/2016 ADMITTING PHYSICIAN: Saundra Shelling, MD  DATE OF DISCHARGE: 01/16/2016  3:10 PM  PRIMARY CARE PHYSICIAN: Peggy Pink, MD    ADMISSION DIAGNOSIS:  Hip pain, right [M25.551] Atrial fibrillation with RVR ([place]) [I48.91] Osteoarthritis of right knee, unspecified osteoarthritis type [M17.9]  DISCHARGE DIAGNOSIS:  Active Problems:   Atrial fibrillation with RVR (HCC)   Hip pain, acute   Hip pain   SECONDARY DIAGNOSIS:   Past Medical History  Diagnosis Date  . Essential hypertension, benign   . Arthritis   . Heart disease 2001  . Hypertension 1996  . Stroke [facility]) 2009  . Heart attack ([place]) 2001  . Unspecified essential hypertension   . Hearing disorder 2012    hearing aids  . Reflux     acid reflux  . Breast screening, unspecified   . Lump or mass in breast   . Special screening for malignant neoplasms, colon   . Other benign neoplasm of connective and other soft tissue of thorax   . Personal history of malignant neoplasm of breast   . Atrial fibrillation ([place])   . Allergy   . Atrial fibrillation ([place])   . Atrial fibrillation with rapid ventricular response [facility]) November 2015    Heart rate up to 140 requiring IV Cardizem  . Breast cancer [facility]) 2012    Patient underwent a left mastectomy on 03-31-11. Pathology showed DCIS at both sites without an invasive component. Sentinel nodes were negative. The breast lesions were noted to be ER +40%, PR-positive, 5%.   . Malignant neoplasm of upper-inner quadrant of female breast ([place])   . Malignant neoplasm of upper-outer quadrant of female breast ([place])   . Hip pain     HOSPITAL COURSE:   1. Rapid atrial fibrillation that converted to normal sinus rhythm. Initially the patient was put on Cardizem drip converted over to oral medications. By the time she got  up to telemetry she converted to normal sinus rhythm. Her Coumadin was held on the day of admission secondary to high INR. Her INR decreased down into the normal range and started back on her usual Coumadin dose. She was kept on her usual metoprolol and Cardizem dose. 2. Right hip pain and severe. I started low-dose prednisone and give a prednisone taper hip pain improved. She has tramadol as needed for pain at home. Patient refused home health. Follow-up with orthopedic surgery as outpatient. 3. Elevated troponin- secondary to rapid atrial fibrillation. No further workup. 4. Gastroesophageal reflux disease without esophagitis 5. History of breast cancer 6. History of arthritis  DISCHARGE CONDITIONS:   Satisfactory  CONSULTS OBTAINED:  Treatment Team:  Dereck Leep, MD  DRUG ALLERGIES:   Allergies  Allergen Reactions  . Alendronate Sodium     REACTION: questionable  . Amoxicillin-Pot Clavulanate   . Etodolac   . Ibandronate Sodium     REACTION: questionable  . Iodine     Other reaction(s): Unknown  . Lactose Intolerance (Gi) Other (See Comments)    GI problems  . Minocycline Hcl   . Risedronate Sodium   . Sulfonamide Derivatives Other (See Comments)    Stomach pain  . Zoledronic Acid     REACTION: "dental problems"    DISCHARGE MEDICATIONS:   Discharge Medication List as of 01/16/2016  2:23 PM    START  taking these medications   Details  predniSONE (DELTASONE) 10 MG tablet One tab daily for two days then 1/2 tab po daily for four days then stop, Print      CONTINUE these medications which have NOT CHANGED   Details  acetaminophen (TYLENOL) 325 MG tablet Take 325 mg by mouth every 6 (six) hours as needed for mild pain. , Until Discontinued, Historical Med    albuterol (PROAIR HFA) 108 (90 BASE) MCG/ACT inhaler Inhale into the lungs., Starting 07/07/2015, Until Sun 07/06/16, Historical Med    ALPRAZolam (XANAX) 0.25 MG tablet Take 0.25 mg by mouth at bedtime as needed  for anxiety. For sleep, Starting 11/10/2014, Until Discontinued, Historical Med    CALCIUM PO Take 1 tablet by mouth daily. , Until Discontinued, Historical Med    diltiazem (CARDIZEM CD) 240 MG 24 hr capsule Take 240 mg by mouth daily., Starting 02/16/2015, Until Discontinued, Historical Med    metoprolol (LOPRESSOR) 50 MG tablet Take 50 mg by mouth 2 (two) times daily., Until Discontinued, Historical Med    nitroGLYCERIN (NITROSTAT) 0.4 MG SL tablet Place 0.4 mg under the tongue every 5 (five) minutes as needed for chest pain. Every 5 minutes as needed, Starting 10/02/2014, Until Discontinued, Historical Med    traMADol (ULTRAM) 50 MG tablet Take 1-2 tablets by mouth every 6 hours as needed for moderate to severe pain, Print    warfarin (COUMADIN) 4 MG tablet Take 4 mg by mouth daily. Patient states she takes a 3mg  and 1 mg tablet daily for a total of 4mg  every evening., Until Discontinued, Historical Med      STOP taking these medications     escitalopram (LEXAPRO) 10 MG tablet      aspirin 81 MG tablet      escitalopram (LEXAPRO) 10 MG tablet      Lansoprazole (PREVACID PO)      Multiple Vitamin (MULTI-VITAMINS) TABS      pravastatin (PRAVACHOL) 20 MG tablet      tiZANidine (ZANAFLEX) 2 MG tablet          DISCHARGE INSTRUCTIONS:   Follow-up with medical doctor as outpatient  If you experience worsening of your admission symptoms, develop shortness of breath, life threatening emergency, suicidal or homicidal thoughts you must seek medical attention immediately by calling 911 or calling your MD immediately  if symptoms less severe.  You Must read complete instructions/literature along with all the possible adverse reactions/side effects for all the Medicines you take and that have been prescribed to you. Take any new Medicines after you have completely understood and accept all the possible adverse reactions/side effects.   Please note  You were cared for by a  hospitalist during your hospital stay. If you have any questions about your discharge medications or the care you received while you were in the hospital after you are discharged, you can call the unit and asked to speak with the hospitalist on call if the hospitalist that took care of you is not available. Once you are discharged, your primary care physician will handle any further medical issues. Please note that NO REFILLS for any discharge medications will be authorized once you are discharged, as it is imperative that you return to your primary care physician (or establish a relationship with a primary care physician if you do not have one) for your aftercare needs so that they can reassess your need for medications and monitor your lab values.    Today   CHIEF COMPLAINT:  Chief Complaint  Patient presents with  . Hip Pain    HISTORY OF PRESENT ILLNESS:  Peggy [name]  is a 82 y.o. female came in with severe right hip pain and found to have atrial fibrillation rapid ventricular response   VITAL SIGNS:  Blood pressure 147/69, pulse 66, temperature 98.7 F (37.1 C), temperature source Oral, resp. rate 18, height 4\' 8"  (1.422 m), weight 58.741 kg (129 lb 8 oz), SpO2 94 %.    PHYSICAL EXAMINATION:  GENERAL:  82 y.o.-year-old patient lying in the bed with no acute distress.  EYES: Pupils equal, round, reactive to light and accommodation. No scleral icterus. Extraocular muscles intact.  HEENT: Head atraumatic, normocephalic. Oropharynx and nasopharynx clear.  NECK:  Supple, no jugular venous distention. No thyroid enlargement, no tenderness.  LUNGS: Normal breath sounds bilaterally, no wheezing, rales,rhonchi or crepitation. No use of accessory muscles of respiration.  CARDIOVASCULAR: S1, S2 normal. 2/6 systolic murmurs, no rubs, or gallops.  ABDOMEN: Soft, non-tender, non-distended. Bowel sounds present. No organomegaly or mass.  EXTREMITIES: Trace edema, no cyanosis, or clubbing. I  was able to move around the right hip little bit better today than yesterday. NEUROLOGIC: Cranial nerves II through XII are intact. Muscle strength 5/5 in all extremities. Sensation intact. Gait not checked.  PSYCHIATRIC: The patient is alert and oriented x 3.  SKIN: No obvious rash, lesion, or ulcer.   DATA REVIEW:   CBC  Recent Labs Lab 01/15/16 1924  WBC 9.1  HGB 12.1  HCT 35.2  PLT 250    Chemistries   Recent Labs Lab 01/15/16 0222 01/15/16 1213  NA 138 140  K 3.8 4.5  CL 106 109  CO2 26 23  GLUCOSE 111* 105*  BUN 19 12  CREATININE 0.76 0.65  CALCIUM 9.4 8.7*  AST 33  --   ALT 20  --   ALKPHOS 88  --   BILITOT 0.8  --     Cardiac Enzymes  Recent Labs Lab 01/15/16 2343  TROPONINI 0.04*     RADIOLOGY:  Dg Chest Port 1 View  01/15/2016  CLINICAL DATA:  Atrial fibrillation with rapid ventricular response. History of hypertension, stroke, MI, atrial fibrillation, breast cancer. EXAM: PORTABLE CHEST 1 VIEW COMPARISON:  09/16/2015 FINDINGS: Borderline heart size with normal pulmonary vascularity. No focal airspace disease or consolidation in the lungs. No blunting of costophrenic angles. No pneumothorax. Slight linear fibrosis in the left mid lung. Calcified and tortuous aorta. IMPRESSION: No active disease. Electronically Signed   By: Lucienne Capers M.D.   On: 01/15/2016 03:05     Management plans discussed with the patient, family and they are in agreement.  CODE STATUS:     Code Status Orders        Start     Ordered   01/15/16 1135  Full code   Continuous     01/15/16 1134    Code Status History    Date Active Date Inactive Code Status Order ID Comments User Context   03/11/2015  5:33 PM 03/12/2015  6:42 PM Full Code [id number]  Jonetta Osgood, MD Inpatient      TOTAL TIME TAKING CARE OF THIS PATIENT: 35 minutes.    Loletha Grayer M.D on 01/16/2016 at 5:40 PM  Between 7am to 6pm - Pager - [phone]  After 6pm go to [email or web address] -  password [email or web address] [facility]  [facility]  Office  [phone]  CC: Primary care physician; Peggy Pink, MD

## 2016-01-16 NOTE — Evaluation (Signed)
Physical Therapy Evaluation Patient Details Name: Peggy [name] MRN: [id number] DOB: 1928-09-23 Today's Date: 01/16/2016   History of Present Illness  Patient is an 82 y/o female that presents with R hip pain, which she has had a long history of. Imaging has been negative for an acute fracture. Her pain radiates from the groin down through the anterior thigh to anterior tibial region.   Clinical Impression  Patient appears to have a dysfunction in her R hip flexors as she expresses pain with trunk extension, knee extension, and hip flexion in this examination. She appears to have a chronic R hip dysfunction, limiting her ambulation in this session, however no balance deficits identified. She did not report any pain with repeated hip flexion, indicating she may be a good candidate for McKenzie approach with repeated movements. She reports she is feeling better with the injections she has received and is able to complete household mobility in this session. Patient would continue to benefit from skilled PT services to address her chronic R hip pain and increase her functional mobility balance.     Follow Up Recommendations Home health PT    Equipment Recommendations  Rolling walker with 5" wheels    Recommendations for Other Services       Precautions / Restrictions Precautions Precautions: Fall Restrictions Weight Bearing Restrictions: No      Mobility  Bed Mobility Overal bed mobility: Needs Assistance Bed Mobility: Supine to Sit     Supine to sit: Min guard;Min assist     General bed mobility comments: Patient demonstrates increased time to complete transfer secondary to pain in her R hip. No other deficits noted.   Transfers Overall transfer level: Needs assistance Equipment used: Rolling walker (2 wheeled) Transfers: Sit to/from Stand Sit to Stand: Supervision         General transfer comment: No decrease in speed or balance noted, patient demonstrates appropriate  hand placement.   Ambulation/Gait Ambulation/Gait assistance: Supervision Ambulation Distance (Feet): 100 Feet Assistive device: Rolling walker (2 wheeled) Gait Pattern/deviations: WFL(Within Functional Limits);Decreased step length - right;Decreased step length - left     General Gait Details: Patient ambulates with reciprocal step pattern, though decreased stride length noted bilaterally. No excessive lean or offloading noted.   Stairs            Wheelchair Mobility    Modified Rankin (Stroke Patients Only)       Balance Overall balance assessment: Needs assistance Sitting-balance support: No upper extremity supported Sitting balance-Leahy Scale: Good     Standing balance support: Bilateral upper extremity supported Standing balance-Leahy Scale: Good Standing balance comment: No deficits identified in ambulation.                              Pertinent Vitals/Pain Pain Assessment:  (Patient reports no pain at rest, increase in pain with supine to sit transfer, which resolved itself. Increase in pain with ambulation, though this did not alter her gait pattern.)    Home Living Family/patient expects to be discharged to:: Private residence Living Arrangements: Alone Available Help at Discharge: Family;Available PRN/intermittently (Her daughter lives nearby) Type of Home: House Home Access: Stairs to enter   [employer] of Steps:  (Patient did not specify) Home Layout: One level        Prior Function Level of Independence: Independent         Comments: Patient had been ambulating independently with no falls reported prior  to this admission.      Hand Dominance        Extremity/Trunk Assessment   Upper Extremity Assessment: Overall WFL for tasks assessed           Lower Extremity Assessment: RLE deficits/detail RLE Deficits / Details: Pain noted with knee extension and hip flexion.        Communication   Communication:  No difficulties  Cognition Arousal/Alertness: Awake/alert Behavior During Therapy: WFL for tasks assessed/performed Overall Cognitive Status: Within Functional Limits for tasks assessed (She has delayed responses, though generally appropriate.)                      General Comments      Exercises        Assessment/Plan    PT Assessment Patient needs continued PT services  PT Diagnosis Difficulty walking;Generalized weakness   PT Problem List Decreased strength;Decreased knowledge of use of DME;Decreased activity tolerance;Decreased balance  PT Treatment Interventions DME instruction;Gait training;Stair training;Therapeutic activities;Therapeutic exercise;Balance training;Manual techniques   PT Goals (Current goals can be found in the Care Plan section) Acute Rehab PT Goals Patient Stated Goal: To return home  PT Goal Formulation: With patient Time For Goal Achievement: 01/30/16 Potential to Achieve Goals: Good    Frequency Min 2X/week   Barriers to discharge Decreased caregiver support Patient lives alone.     Co-evaluation               End of Session Equipment Utilized During Treatment: Gait belt Activity Tolerance: Patient tolerated treatment well;Patient limited by fatigue Patient left: in chair;with call bell/phone within reach;with chair alarm set Nurse Communication: Mobility status         Time: [id number] PT Time Calculation (min) (ACUTE ONLY): 18 min   Charges:   PT Evaluation $PT Eval Moderate Complexity: 1 Procedure     PT G Codes:       Kerman Passey, PT, DPT    01/16/2016, 6:11 PM

## 2016-01-16 NOTE — Care Management Note (Addendum)
Case Management Note  Patient Details  Name: Peggy [name] MRN: [id number] Date of Birth: 28-Jun-1928  Subjective/Objective:  Cm assessment for discharge planning. Met with patient and daughter at bedside. Discussed home health services. Patient states she continues to drive,  goes out each day with friends and participates in  community activities as much as possible. She has a new walker at home but refuses to use it outside the home. Patient refused any home health services. She is not home bound and states she will not stop driving as long as she can get out.                  Action/Plan: Provided patient with a list of home health agencies in the event her health changes when she gets home. Suggested outpatient PT. Patient will think about it. Provided CM contact information. Encouraged her to call her PCP if home health needed in the future. Notified Dr. Earleen Newport of patients refusal of home health services and that she was not home bound.  Expected Discharge Date:   01/16/2016               Expected Discharge Plan:  Home/Self Care  In-House Referral:     Discharge planning Services  CM Consult  Post Acute Care Choice:  Home Health Choice offered to:  Patient  DME Arranged:    DME Agency:     HH Arranged:  Patient Refused [employer] Agency:     Status of Service:  Completed, signed off  Medicare Important Message Given:    Date Medicare IM Given:    Medicare IM give by:    Date Additional Medicare IM Given:    Additional Medicare Important Message give by:     If discussed at [place] of Stay Meetings, dates discussed:    Additional Comments:  Jolly Mango, RN 01/16/2016, 1:02 PM

## 2016-01-16 NOTE — Progress Notes (Signed)
Pt discharged to home via wc.  Instructions and rx given to pt.  Questions answered.  No distress.  

## 2016-01-16 NOTE — Consult Note (Signed)
ANTICOAGULATION CONSULT NOTE - Follow up [place] for warfarin Indication: atrial fibrillation  Allergies  Allergen Reactions  . Alendronate Sodium     REACTION: questionable  . Amoxicillin-Pot Clavulanate   . Etodolac   . Ibandronate Sodium     REACTION: questionable  . Iodine     Other reaction(s): Unknown  . Lactose Intolerance (Gi) Other (See Comments)    GI problems  . Minocycline Hcl   . Risedronate Sodium   . Sulfonamide Derivatives Other (See Comments)    Stomach pain  . Zoledronic Acid     REACTION: "dental problems"    Patient Measurements: Height: 4\' 8"  (142.2 cm) Weight: 129 lb 8 oz (58.741 kg) IBW/kg (Calculated) : 36.3 Heparin Dosing Weight:   Vital Signs: Temp: 98 F (36.7 C) (01/25 0750) Temp Source: Oral (01/25 0750) BP: 161/74 mmHg (01/25 0750) Pulse Rate: 79 (01/25 0750)  Labs:  Recent Labs  01/15/16 0222 01/15/16 1213 01/15/16 1721 01/15/16 1924 01/15/16 2343 01/16/16 0433  HGB 13.5  --   --  12.1  --   --   HCT 40.2  --   --  35.2  --   --   PLT 281  --   --  250  --   --   LABPROT 41.2* 38.8*  --   --   --  29.0*  INR 4.45* 4.14*  --   --   --  2.79  CREATININE 0.76 0.65  --   --   --   --   TROPONINI <0.03 0.05* 0.04*  --  0.04*  --     Estimated Creatinine Clearance: 36.8 mL/min (by C-G formula based on Cr of 0.65).   Assessment: Pt is a 82 year old female on warfarin for Afib. Pt INR on admission was 4.45. Her home dose is 4mg  daily.   1/24 INR 4.45, 4.14; Hbg 13.5, 12.1 - no warfarin 1/25 INR 2.79  Goal of Therapy:  INR 2-3 Monitor platelets by anticoagulation protocol: Yes   Plan:  INR within goal range today. Will resume warfarin at  2.5 mg PO x1 for today at 1800.  Will recheck INR in the AM.  Pharmacy will continue to follow.   Rayna Sexton L 01/16/2016,11:21 AM

## 2016-01-16 NOTE — Plan of Care (Signed)
Problem: Safety: Goal: Ability to remain free from injury will improve Outcome: Progressing Fall precautions in place  Problem: Pain Managment: Goal: General experience of comfort will improve Outcome: Progressing Scheduled tramadol and prn medications  Problem: Physical Regulation: Goal: Ability to maintain clinical measurements within normal limits will improve Outcome: Progressing Physical therapy evaluation pending  Problem: Tissue Perfusion: Goal: Risk factors for ineffective tissue perfusion will decrease Outcome: Progressing Pt on warfarin

## 2016-01-24 ENCOUNTER — Ambulatory Visit: Payer: Medicare Other | Admitting: Neurology

## 2016-02-13 ENCOUNTER — Encounter
Admission: RE | Admit: 2016-02-13 | Discharge: 2016-02-13 | Disposition: A | Discharge status: Home / Self Care | Payer: Medicare Other | Source: Ambulatory Visit | Attending: Orthopedic Surgery | Admitting: Orthopedic Surgery

## 2016-02-13 DIAGNOSIS — I4891 Unspecified atrial fibrillation: Secondary | ICD-10-CM | POA: Diagnosis not present

## 2016-02-13 DIAGNOSIS — K219 Gastro-esophageal reflux disease without esophagitis: Secondary | ICD-10-CM | POA: Diagnosis not present

## 2016-02-13 DIAGNOSIS — I10 Essential (primary) hypertension: Secondary | ICD-10-CM | POA: Diagnosis not present

## 2016-02-13 DIAGNOSIS — M1611 Unilateral primary osteoarthritis, right hip: Secondary | ICD-10-CM | POA: Diagnosis not present

## 2016-02-13 DIAGNOSIS — I252 Old myocardial infarction: Secondary | ICD-10-CM | POA: Insufficient documentation

## 2016-02-13 DIAGNOSIS — Z01812 Encounter for preprocedural laboratory examination: Secondary | ICD-10-CM | POA: Insufficient documentation

## 2016-02-13 DIAGNOSIS — E785 Hyperlipidemia, unspecified: Secondary | ICD-10-CM | POA: Insufficient documentation

## 2016-02-13 HISTORY — DX: Diverticulosis of intestine, part unspecified, without perforation or abscess without bleeding: K57.90

## 2016-02-13 HISTORY — DX: Atherosclerotic heart disease of native coronary artery without angina pectoris: I25.10

## 2016-02-13 HISTORY — DX: Hyperlipidemia, unspecified: E78.5

## 2016-02-13 HISTORY — DX: Gastro-esophageal reflux disease without esophagitis: K21.9

## 2016-02-13 HISTORY — DX: Anxiety disorder, unspecified: F41.9

## 2016-02-13 LAB — URINALYSIS COMPLETE WITH MICROSCOPIC (ARMC ONLY)
BILIRUBIN URINE: NEGATIVE
Bacteria, UA: NONE SEEN
GLUCOSE, UA: NEGATIVE mg/dL
HGB URINE DIPSTICK: NEGATIVE
KETONES UR: NEGATIVE mg/dL
NITRITE: NEGATIVE
PH: 5 (ref 5.0–8.0)
Protein, ur: NEGATIVE mg/dL
SPECIFIC GRAVITY, URINE: 1.016 (ref 1.005–1.030)

## 2016-02-13 LAB — BASIC METABOLIC PANEL
ANION GAP: 9 (ref 5–15)
BUN: 19 mg/dL (ref 6–20)
CHLORIDE: 103 mmol/L (ref 101–111)
CO2: 26 mmol/L (ref 22–32)
Calcium: 9 mg/dL (ref 8.9–10.3)
Creatinine, Ser: 0.93 mg/dL (ref 0.44–1.00)
GFR calc Af Amer: >60 mL/min (ref >60)
GFR, EST NON AFRICAN AMERICAN: 54 mL/min — AB (ref >60)
GLUCOSE: 183 mg/dL — AB (ref 65–99)
POTASSIUM: 3.6 mmol/L (ref 3.5–5.1)
Sodium: 138 mmol/L (ref 135–145)

## 2016-02-13 LAB — CBC
HCT: 40.5 % (ref 35.0–47.0)
Hemoglobin: 13.4 g/dL (ref 12.0–16.0)
MCH: 32.3 pg (ref 26.0–34.0)
MCHC: 33.1 g/dL (ref 32.0–36.0)
MCV: 97.6 fL (ref 80.0–100.0)
PLATELETS: 326 10*3/uL (ref 150–440)
RBC: 4.15 MIL/uL (ref 3.80–5.20)
RDW: 13.4 % (ref 11.5–14.5)
WBC: 11.4 10*3/uL — AB (ref 3.6–11.0)

## 2016-02-13 LAB — SURGICAL PCR SCREEN
MRSA, PCR: NEGATIVE
Staphylococcus aureus: NEGATIVE

## 2016-02-13 LAB — TYPE AND SCREEN
ABO/RH(D): A POS
Antibody Screen: NEGATIVE

## 2016-02-13 LAB — PROTIME-INR
INR: 2.44
Prothrombin Time: 26.2 seconds — ABNORMAL HIGH (ref 11.4–15.0)

## 2016-02-13 LAB — APTT: APTT: 35 s (ref 24–36)

## 2016-02-13 LAB — ABO/RH: ABO/RH(D): A POS

## 2016-02-13 LAB — SEDIMENTATION RATE: SED RATE: 35 mm/h — AB (ref 0–30)

## 2016-02-13 NOTE — Patient Instructions (Signed)
  Your procedure is scheduled on: February 27, 2016 (Wednesday) Report to Day Surgery.[facility]) Second Floor To find out your arrival time please call [phone] between 1PM - 3PM on February 26, 2016 (Tuesday).  Remember: Instructions that are not followed completely may result in serious medical risk, up to and including death, or upon the discretion of your surgeon and anesthesiologist your surgery may need to be rescheduled.    __x__ 1. Do not eat food or drink liquids after midnight. No gum chewing or hard candies.     ____ 2. No Alcohol for 24 hours before or after surgery.   ____ 3. Bring all medications with you on the day of surgery if instructed.    __x__ 4. Notify your doctor if there is any change in your medical condition     (cold, fever, infections).     Do not wear jewelry, make-up, hairpins, clips or nail polish.  Do not wear lotions, powders, or perfumes. You may wear deodorant.  Do not shave 48 hours prior to surgery. Men may shave face and neck.  Do not bring valuables to the hospital.    [facility] is not responsible for any belongings or valuables.               Contacts, dentures or bridgework may not be worn into surgery.  Leave your suitcase in the car. After surgery it may be brought to your room.  For patients admitted to the hospital, discharge time is determined by your                treatment team.   Patients discharged the day of surgery will not be allowed to drive home.   Please read over the following fact sheets that you were given:   MRSA Information and Surgical Site Infection Prevention   ____ Take these medicines the morning of surgery with A SIP OF WATER:    1. Metoprolol  2. Diltiazem  3. Lansoprazole (Lansoprazole at bedtime on March 7)  4.Lexapro  5.  6.  ____ Fleet Enema (as directed)   _x___ Use CHG Soap as directed  ____ Use inhalers on the day of surgery  ____ Stop metformin 2 days prior to surgery    ____ Take 1/2 of  usual insulin dose the night before surgery and none on the morning of surgery.   __x__ Stop Coumadin/Plavix/aspirin on (Ask Dr,. Hooten about stopping Aspirin)Stop Coumadin five days prior to surgery as instructed by Dr. Marry Guan  _x_ Stop Anti-inflammatories on (NO NSAIDS) Tylenol ok to take for pain or Tramadol if needed    _x_ Stop supplements until after surgery. (Stop Probiotic and PreserVision)   ____ Bring C-Pap to the hospital.

## 2016-02-13 NOTE — Pre-Procedure Instructions (Signed)
Component Name Value Range  Vent Rate (bpm) 72   PR Interval (msec) 120   QRS Interval (msec) 100   QT Interval (msec) 392   QTc (msec) 429    Result Narrative  Normal sinus rhythm Left axis deviation Incomplete right bundle branch block Left ventricular hypertrophy with repolarization abnormality Cannot rule out Septal infarct (cited on or before 30-Oct-2014) Abnormal ECG When compared with ECG of 01-Aug-2015 14:14, No significant change was found I reviewed and concur with this report. Electronically signed [id number] MD, KEN [id number]) on 10/19/2015 3:32:05 PM   Status Results Details

## 2016-02-13 NOTE — Pre-Procedure Instructions (Signed)
Patient cleared for low risk for surgery by Dr. Ubaldo Glassing.

## 2016-02-14 LAB — URINE CULTURE: Culture: NO GROWTH

## 2016-02-27 ENCOUNTER — Inpatient Hospital Stay: Payer: Medicare Other

## 2016-02-27 ENCOUNTER — Inpatient Hospital Stay
Admission: RE | Admit: 2016-02-27 | Discharge: 2016-03-02 | DRG: 470 | Disposition: A | Discharge status: Skilled Nursing Facility | Payer: Medicare Other | Source: Ambulatory Visit | Attending: Orthopedic Surgery | Admitting: Orthopedic Surgery

## 2016-02-27 ENCOUNTER — Inpatient Hospital Stay: Payer: Medicare Other | Admitting: Anesthesiology

## 2016-02-27 ENCOUNTER — Encounter
Admission: RE | Disposition: A | Discharge status: Skilled Nursing Facility | Payer: Self-pay | Source: Ambulatory Visit | Attending: Orthopedic Surgery

## 2016-02-27 ENCOUNTER — Encounter: Payer: Self-pay | Admitting: Orthopedic Surgery

## 2016-02-27 DIAGNOSIS — I4891 Unspecified atrial fibrillation: Secondary | ICD-10-CM | POA: Diagnosis present

## 2016-02-27 DIAGNOSIS — Z8673 Personal history of transient ischemic attack (TIA), and cerebral infarction without residual deficits: Secondary | ICD-10-CM | POA: Diagnosis not present

## 2016-02-27 DIAGNOSIS — E785 Hyperlipidemia, unspecified: Secondary | ICD-10-CM | POA: Diagnosis present

## 2016-02-27 DIAGNOSIS — Z803 Family history of malignant neoplasm of breast: Secondary | ICD-10-CM | POA: Diagnosis not present

## 2016-02-27 DIAGNOSIS — Z79899 Other long term (current) drug therapy: Secondary | ICD-10-CM | POA: Diagnosis not present

## 2016-02-27 DIAGNOSIS — I10 Essential (primary) hypertension: Secondary | ICD-10-CM | POA: Diagnosis present

## 2016-02-27 DIAGNOSIS — Z853 Personal history of malignant neoplasm of breast: Secondary | ICD-10-CM

## 2016-02-27 DIAGNOSIS — Z8249 Family history of ischemic heart disease and other diseases of the circulatory system: Secondary | ICD-10-CM

## 2016-02-27 DIAGNOSIS — K219 Gastro-esophageal reflux disease without esophagitis: Secondary | ICD-10-CM | POA: Diagnosis present

## 2016-02-27 DIAGNOSIS — H9190 Unspecified hearing loss, unspecified ear: Secondary | ICD-10-CM | POA: Diagnosis present

## 2016-02-27 DIAGNOSIS — Z8041 Family history of malignant neoplasm of ovary: Secondary | ICD-10-CM | POA: Diagnosis not present

## 2016-02-27 DIAGNOSIS — Z7901 Long term (current) use of anticoagulants: Secondary | ICD-10-CM | POA: Diagnosis not present

## 2016-02-27 DIAGNOSIS — Z96649 Presence of unspecified artificial hip joint: Secondary | ICD-10-CM

## 2016-02-27 DIAGNOSIS — M1611 Unilateral primary osteoarthritis, right hip: Principal | ICD-10-CM | POA: Diagnosis present

## 2016-02-27 DIAGNOSIS — I252 Old myocardial infarction: Secondary | ICD-10-CM | POA: Diagnosis not present

## 2016-02-27 DIAGNOSIS — I2510 Atherosclerotic heart disease of native coronary artery without angina pectoris: Secondary | ICD-10-CM | POA: Diagnosis present

## 2016-02-27 DIAGNOSIS — Z7982 Long term (current) use of aspirin: Secondary | ICD-10-CM

## 2016-02-27 DIAGNOSIS — Z96651 Presence of right artificial knee joint: Secondary | ICD-10-CM | POA: Diagnosis present

## 2016-02-27 HISTORY — PX: TOTAL HIP ARTHROPLASTY: SHX124

## 2016-02-27 LAB — PROTIME-INR
INR: 1.03
Prothrombin Time: 13.7 seconds (ref 11.4–15.0)

## 2016-02-27 SURGERY — ARTHROPLASTY, HIP, TOTAL,POSTERIOR APPROACH
Anesthesia: Spinal | Site: Hip | Laterality: Right | Wound class: Clean

## 2016-02-27 MED ORDER — ACETAMINOPHEN 650 MG RE SUPP: 650.0000 mg | Freq: Four times a day (QID) | RECTAL | Status: DC | PRN

## 2016-02-27 MED ORDER — NEOMYCIN-POLYMYXIN B GU 40-200000 IR SOLN
Status: DC | PRN
  Administered 2016-02-27: 14 mL

## 2016-02-27 MED ORDER — PHENOL 1.4 % MT LIQD
1.0000 | OROMUCOSAL | Status: DC | PRN
  Filled 2016-02-27: qty 177

## 2016-02-27 MED ORDER — METOCLOPRAMIDE HCL 10 MG PO TABS
10.0000 mg | ORAL_TABLET | Freq: Three times a day (TID) | ORAL | Status: AC
  Administered 2016-02-27 – 2016-02-29 (×8): 10 mg via ORAL
  Filled 2016-02-27 (×8): qty 1

## 2016-02-27 MED ORDER — TIZANIDINE HCL 4 MG PO TABS: 2.0000 mg | ORAL_TABLET | Freq: Three times a day (TID) | ORAL | Status: DC | PRN

## 2016-02-27 MED ORDER — TRAMADOL HCL 50 MG PO TABS
50.0000 mg | ORAL_TABLET | ORAL | Status: DC | PRN
  Administered 2016-02-27: 50 mg via ORAL
  Administered 2016-02-27: 100 mg via ORAL
  Administered 2016-02-28: 50 mg via ORAL
  Administered 2016-02-29: 100 mg via ORAL
  Administered 2016-03-02: 50 mg via ORAL
  Filled 2016-02-27 (×3): qty 1
  Filled 2016-02-27 (×2): qty 2

## 2016-02-27 MED ORDER — CLINDAMYCIN PHOSPHATE 900 MG/50ML IV SOLN
INTRAVENOUS | Status: AC
  Filled 2016-02-27: qty 50

## 2016-02-27 MED ORDER — NITROGLYCERIN 0.4 MG SL SUBL: 0.4000 mg | SUBLINGUAL_TABLET | SUBLINGUAL | Status: DC | PRN

## 2016-02-27 MED ORDER — ALUM & MAG HYDROXIDE-SIMETH 200-200-20 MG/5ML PO SUSP: 30.0000 mL | ORAL | Status: DC | PRN

## 2016-02-27 MED ORDER — OXYCODONE HCL 5 MG PO TABS
5.0000 mg | ORAL_TABLET | ORAL | Status: DC | PRN
  Administered 2016-02-27: 10 mg via ORAL
  Administered 2016-02-27 – 2016-02-28 (×2): 5 mg via ORAL
  Administered 2016-02-28: 10 mg via ORAL
  Filled 2016-02-27 (×3): qty 2
  Filled 2016-02-27: qty 1

## 2016-02-27 MED ORDER — SENNOSIDES-DOCUSATE SODIUM 8.6-50 MG PO TABS
1.0000 | ORAL_TABLET | Freq: Two times a day (BID) | ORAL | Status: DC
  Administered 2016-02-27 – 2016-03-02 (×9): 1 via ORAL
  Filled 2016-02-27 (×9): qty 1

## 2016-02-27 MED ORDER — MORPHINE SULFATE (PF) 2 MG/ML IV SOLN
2.0000 mg | INTRAVENOUS | Status: DC | PRN
  Administered 2016-02-27: 2 mg via INTRAVENOUS
  Filled 2016-02-27: qty 1

## 2016-02-27 MED ORDER — LACTATED RINGERS IV SOLN
INTRAVENOUS | Status: DC
Start: 2016-02-27 — End: 2016-02-27
  Administered 2016-02-27: 07:00:00 via INTRAVENOUS

## 2016-02-27 MED ORDER — MAGNESIUM HYDROXIDE 400 MG/5ML PO SUSP
30.0000 mL | Freq: Every day | ORAL | Status: DC | PRN
  Administered 2016-02-28: 30 mL via ORAL
  Filled 2016-02-27: qty 30

## 2016-02-27 MED ORDER — ALPRAZOLAM 0.25 MG PO TABS
0.2500 mg | ORAL_TABLET | Freq: Every evening | ORAL | Status: DC | PRN
  Administered 2016-02-27 – 2016-02-29 (×3): 0.25 mg via ORAL
  Filled 2016-02-27 (×3): qty 1

## 2016-02-27 MED ORDER — WARFARIN - PHARMACIST DOSING INPATIENT: Freq: Every day | Status: DC

## 2016-02-27 MED ORDER — ONDANSETRON HCL 4 MG/2ML IJ SOLN
4.0000 mg | Freq: Four times a day (QID) | INTRAMUSCULAR | Status: DC | PRN
  Administered 2016-02-28: 4 mg via INTRAVENOUS
  Filled 2016-02-27: qty 2

## 2016-02-27 MED ORDER — ACETAMINOPHEN 10 MG/ML IV SOLN
INTRAVENOUS | Status: DC | PRN
  Administered 2016-02-27: 1000 mg via INTRAVENOUS

## 2016-02-27 MED ORDER — SODIUM CHLORIDE FLUSH 0.9 % IV SOLN
INTRAVENOUS | Status: AC
  Filled 2016-02-27: qty 10

## 2016-02-27 MED ORDER — FLEET ENEMA 7-19 GM/118ML RE ENEM: 1.0000 | ENEMA | Freq: Once | RECTAL | Status: DC | PRN

## 2016-02-27 MED ORDER — CALCIUM CARBONATE-VITAMIN D 500-200 MG-UNIT PO TABS
1.0000 | ORAL_TABLET | Freq: Two times a day (BID) | ORAL | Status: DC
  Administered 2016-02-27 – 2016-03-02 (×8): 1 via ORAL
  Filled 2016-02-27 (×9): qty 1

## 2016-02-27 MED ORDER — LORATADINE 10 MG PO TABS
10.0000 mg | ORAL_TABLET | Freq: Every day | ORAL | Status: DC
Start: 2016-02-27 — End: 2016-03-02
  Administered 2016-02-28 – 2016-03-02 (×4): 10 mg via ORAL
  Filled 2016-02-27 (×5): qty 1

## 2016-02-27 MED ORDER — PHENYLEPHRINE HCL 10 MG/ML IJ SOLN
INTRAMUSCULAR | Status: DC | PRN
  Administered 2016-02-27: 50 ug via INTRAVENOUS

## 2016-02-27 MED ORDER — MENTHOL 3 MG MT LOZG
1.0000 | LOZENGE | OROMUCOSAL | Status: DC | PRN
  Filled 2016-02-27: qty 9

## 2016-02-27 MED ORDER — RISAQUAD PO CAPS
ORAL_CAPSULE | Freq: Every day | ORAL | Status: DC
  Administered 2016-02-27 – 2016-03-02 (×5): 1 via ORAL
  Filled 2016-02-27 (×5): qty 1

## 2016-02-27 MED ORDER — ESCITALOPRAM OXALATE 10 MG PO TABS
10.0000 mg | ORAL_TABLET | Freq: Every day | ORAL | Status: DC
  Administered 2016-02-29 – 2016-03-02 (×3): 10 mg via ORAL
  Filled 2016-02-27 (×4): qty 1

## 2016-02-27 MED ORDER — LACTATED RINGERS IV SOLN
INTRAVENOUS | Status: DC | PRN
  Administered 2016-02-27 (×2): via INTRAVENOUS

## 2016-02-27 MED ORDER — FENTANYL CITRATE (PF) 100 MCG/2ML IJ SOLN
INTRAMUSCULAR | Status: DC | PRN
  Administered 2016-02-27: 50 ug via INTRAVENOUS

## 2016-02-27 MED ORDER — OCUVITE-LUTEIN PO CAPS
1.0000 | ORAL_CAPSULE | Freq: Two times a day (BID) | ORAL | Status: DC
  Administered 2016-02-27 – 2016-03-02 (×9): 1 via ORAL
  Filled 2016-02-27 (×9): qty 1

## 2016-02-27 MED ORDER — METOPROLOL TARTRATE 50 MG PO TABS
50.0000 mg | ORAL_TABLET | Freq: Two times a day (BID) | ORAL | Status: DC
  Administered 2016-02-27 – 2016-03-02 (×8): 50 mg via ORAL
  Filled 2016-02-27 (×8): qty 1

## 2016-02-27 MED ORDER — ACETAMINOPHEN 10 MG/ML IV SOLN
INTRAVENOUS | Status: AC
  Filled 2016-02-27: qty 100

## 2016-02-27 MED ORDER — EPHEDRINE SULFATE 50 MG/ML IJ SOLN
INTRAMUSCULAR | Status: DC | PRN
  Administered 2016-02-27 (×3): 10 mg via INTRAVENOUS
  Administered 2016-02-27: 20 mg via INTRAVENOUS

## 2016-02-27 MED ORDER — DIPHENHYDRAMINE HCL 12.5 MG/5ML PO ELIX: 12.5000 mg | ORAL_SOLUTION | ORAL | Status: DC | PRN

## 2016-02-27 MED ORDER — MIDAZOLAM HCL 2 MG/2ML IJ SOLN
INTRAMUSCULAR | Status: DC | PRN
  Administered 2016-02-27 (×2): 1 mg via INTRAVENOUS

## 2016-02-27 MED ORDER — CLINDAMYCIN PHOSPHATE 600 MG/50ML IV SOLN
600.0000 mg | Freq: Four times a day (QID) | INTRAVENOUS | Status: AC
  Administered 2016-02-27 – 2016-02-28 (×4): 600 mg via INTRAVENOUS
  Filled 2016-02-27 (×5): qty 50

## 2016-02-27 MED ORDER — NEOMYCIN-POLYMYXIN B GU 40-200000 IR SOLN
Status: AC
  Filled 2016-02-27: qty 20

## 2016-02-27 MED ORDER — ADULT MULTIVITAMIN W/MINERALS CH
ORAL_TABLET | Freq: Every day | ORAL | Status: DC
  Administered 2016-02-27 – 2016-03-02 (×5): 1 via ORAL
  Filled 2016-02-27 (×5): qty 1

## 2016-02-27 MED ORDER — ONDANSETRON HCL 4 MG PO TABS: 4.0000 mg | ORAL_TABLET | Freq: Four times a day (QID) | ORAL | Status: DC | PRN

## 2016-02-27 MED ORDER — PROPOFOL 500 MG/50ML IV EMUL
INTRAVENOUS | Status: DC | PRN
  Administered 2016-02-27: 50 ug/kg/min via INTRAVENOUS

## 2016-02-27 MED ORDER — ONDANSETRON HCL 4 MG/2ML IJ SOLN: 4.0000 mg | Freq: Once | INTRAMUSCULAR | Status: DC | PRN

## 2016-02-27 MED ORDER — FENTANYL CITRATE (PF) 100 MCG/2ML IJ SOLN: 25.0000 ug | INTRAMUSCULAR | Status: DC | PRN

## 2016-02-27 MED ORDER — CLINDAMYCIN PHOSPHATE 900 MG/50ML IV SOLN
900.0000 mg | Freq: Once | INTRAVENOUS | Status: AC
  Administered 2016-02-27: 900 mg via INTRAVENOUS

## 2016-02-27 MED ORDER — DILTIAZEM HCL ER COATED BEADS 240 MG PO CP24
240.0000 mg | ORAL_CAPSULE | Freq: Every day | ORAL | Status: DC
  Administered 2016-02-28 – 2016-03-02 (×4): 240 mg via ORAL
  Filled 2016-02-27 (×4): qty 1

## 2016-02-27 MED ORDER — BISACODYL 10 MG RE SUPP: 10.0000 mg | Freq: Every day | RECTAL | Status: DC | PRN

## 2016-02-27 MED ORDER — ACETAMINOPHEN 10 MG/ML IV SOLN
1000.0000 mg | Freq: Four times a day (QID) | INTRAVENOUS | Status: AC
  Administered 2016-02-27 – 2016-02-28 (×4): 1000 mg via INTRAVENOUS
  Filled 2016-02-27 (×4): qty 100

## 2016-02-27 MED ORDER — WARFARIN SODIUM 3 MG PO TABS
5.0000 mg | ORAL_TABLET | Freq: Once | ORAL | Status: AC
  Administered 2016-02-27: 5 mg via ORAL
  Filled 2016-02-27: qty 1

## 2016-02-27 MED ORDER — ACETAMINOPHEN 325 MG PO TABS
650.0000 mg | ORAL_TABLET | Freq: Four times a day (QID) | ORAL | Status: DC | PRN
  Administered 2016-02-28 – 2016-02-29 (×2): 650 mg via ORAL
  Filled 2016-02-27 (×2): qty 2

## 2016-02-27 MED ORDER — SODIUM CHLORIDE 0.9 % IV SOLN
INTRAVENOUS | Status: DC
  Administered 2016-02-27 – 2016-02-28 (×3): via INTRAVENOUS

## 2016-02-27 MED ORDER — FERROUS SULFATE 325 (65 FE) MG PO TABS
325.0000 mg | ORAL_TABLET | Freq: Two times a day (BID) | ORAL | Status: DC
  Administered 2016-02-27 – 2016-03-02 (×8): 325 mg via ORAL
  Filled 2016-02-27 (×9): qty 1

## 2016-02-27 SURGICAL SUPPLY — 50 items
BLADE DRUM FLTD (BLADE) ×3 IMPLANT
BLADE SAW 1 (BLADE) ×3 IMPLANT
CANISTER SUCT 1200ML W/VALVE (MISCELLANEOUS) ×3 IMPLANT
CANISTER SUCT 3000ML (MISCELLANEOUS) ×6 IMPLANT
CAPT HIP TOTAL 2 ×3 IMPLANT
CARTRIDGE OIL MAESTRO DRILL (MISCELLANEOUS) ×1 IMPLANT
CATH FOL LEG HOLDER (MISCELLANEOUS) ×3 IMPLANT
CATH TRAY METER 16FR LF (MISCELLANEOUS) ×3 IMPLANT
DIFFUSER MAESTRO (MISCELLANEOUS) ×3 IMPLANT
DRAPE INCISE IOBAN 66X60 STRL (DRAPES) ×3 IMPLANT
DRAPE SHEET LG 3/4 BI-LAMINATE (DRAPES) ×3 IMPLANT
DRAPE TABLE BACK 80X90 (DRAPES) ×3 IMPLANT
DRSG DERMACEA 8X12 NADH (GAUZE/BANDAGES/DRESSINGS) ×3 IMPLANT
DRSG OPSITE POSTOP 3X4 (GAUZE/BANDAGES/DRESSINGS) ×3 IMPLANT
DRSG OPSITE POSTOP 4X12 (GAUZE/BANDAGES/DRESSINGS) ×3 IMPLANT
DRSG OPSITE POSTOP 4X14 (GAUZE/BANDAGES/DRESSINGS) ×3 IMPLANT
DRSG TEGADERM 4X4.75 (GAUZE/BANDAGES/DRESSINGS) ×3 IMPLANT
DURAPREP 26ML APPLICATOR (WOUND CARE) ×3 IMPLANT
ELECT BLADE 6.5 EXT (BLADE) ×3 IMPLANT
ELECT CAUTERY BLADE 6.4 (BLADE) ×3 IMPLANT
GLOVE BIOGEL M STRL SZ7.5 (GLOVE) ×3 IMPLANT
GLOVE INDICATOR 8.0 STRL GRN (GLOVE) ×3 IMPLANT
GLOVE SURG 9.0 ORTHO LTXF (GLOVE) ×3 IMPLANT
GLOVE SURG ORTHO 9.0 STRL STRW (GLOVE) ×3 IMPLANT
GOWN STRL REUS W/ TWL LRG LVL3 (GOWN DISPOSABLE) ×2 IMPLANT
GOWN STRL REUS W/TWL 2XL LVL3 (GOWN DISPOSABLE) ×3 IMPLANT
GOWN STRL REUS W/TWL LRG LVL3 (GOWN DISPOSABLE) ×4
HANDPIECE SUCTION TUBG SURGILV (MISCELLANEOUS) ×3 IMPLANT
HEMOVAC 400CC 10FR (MISCELLANEOUS) ×3 IMPLANT
HOOD PEEL AWAY FLYTE STAYCOOL (MISCELLANEOUS) ×6 IMPLANT
KIT RM TURNOVER STRD PROC AR (KITS) ×3 IMPLANT
NDL SAFETY 18GX1.5 (NEEDLE) ×3 IMPLANT
NS IRRIG 1000ML POUR BTL (IV SOLUTION) ×3 IMPLANT
OIL CARTRIDGE MAESTRO DRILL (MISCELLANEOUS) ×3
PACK HIP PROSTHESIS (MISCELLANEOUS) ×3 IMPLANT
SOL .9 NS 3000ML IRR  AL (IV SOLUTION) ×2
SOL .9 NS 3000ML IRR UROMATIC (IV SOLUTION) ×1 IMPLANT
SOL PREP PVP 2OZ (MISCELLANEOUS) ×3
SOLUTION PREP PVP 2OZ (MISCELLANEOUS) ×1 IMPLANT
SPONGE DRAIN TRACH 4X4 STRL 2S (GAUZE/BANDAGES/DRESSINGS) ×3 IMPLANT
STAPLER SKIN PROX 35W (STAPLE) ×3 IMPLANT
SUT ETHIBOND #5 BRAIDED 30INL (SUTURE) ×3 IMPLANT
SUT VIC AB 0 CT1 36 (SUTURE) ×3 IMPLANT
SUT VIC AB 1 CT1 36 (SUTURE) ×6 IMPLANT
SUT VIC AB 2-0 CT1 27 (SUTURE) ×2
SUT VIC AB 2-0 CT1 TAPERPNT 27 (SUTURE) ×1 IMPLANT
SYR 20CC LL (SYRINGE) ×3 IMPLANT
TAPE ADH 3 LX (MISCELLANEOUS) ×3 IMPLANT
TAPE TRANSPORE STRL 2 31045 (GAUZE/BANDAGES/DRESSINGS) ×3 IMPLANT
WATER STERILE IRR 1000ML POUR (IV SOLUTION) ×6 IMPLANT

## 2016-02-27 NOTE — Brief Op Note (Signed)
02/27/2016  10:46 AM  PATIENT:  Peggy [name]  82 y.o. female  PRE-OPERATIVE DIAGNOSIS:  primary osteoarthritis of the right hip  POST-OPERATIVE DIAGNOSIS:  Same  PROCEDURE:  Procedure(s): TOTAL HIP ARTHROPLASTY (Right)  SURGEON:  Surgeon(s) and Role:    * Dereck Leep, MD - Primary  ASSISTANTS: Vance Peper, PA   ANESTHESIA:   spinal  EBL:  Total I/O In: 1600 [I.V.:1600] Out: 450 [Urine:200; Blood:250]  BLOOD ADMINISTERED:none  DRAINS: 2 medium Hemovac drains   LOCAL MEDICATIONS USED:  NONE  SPECIMEN:  Source of Specimen:  Right femoral head  DISPOSITION OF SPECIMEN:  PATHOLOGY  COUNTS:  YES  TOURNIQUET:  * No tourniquets in log *  DICTATION: .Dragon Dictation  PLAN OF CARE: Admit to inpatient   PATIENT DISPOSITION:  PACU - hemodynamically stable.   Delay start of Pharmacological VTE agent (>24hrs) due to surgical blood loss or risk of bleeding: yes

## 2016-02-27 NOTE — Op Note (Signed)
OPERATIVE NOTE  DATE OF SURGERY:  02/27/2016  PATIENT NAME:  ZYERIA HAYRE   DOB: 1928/05/31  MRN: [id number]  PRE-OPERATIVE DIAGNOSIS: Degenerative arthrosis of the right hip, primary  POST-OPERATIVE DIAGNOSIS:  Same  PROCEDURE:  Right total hip arthroplasty  SURGEON:  Marciano Sequin. M.D.  ASSISTANT:  Vance Peper, PA (present and scrubbed throughout the case, critical for assistance with exposure, retraction, instrumentation, and closure)  ANESTHESIA: spinal  ESTIMATED BLOOD LOSS: 250 mL  FLUIDS REPLACED: 1600 mL of crystalloid  DRAINS: 2 medium drains to a Hemovac reservoir  IMPLANTS UTILIZED: DePuy 15 mm small stature AML femoral stem, 48 mm OD Pinnacle 100 acetabular component, neutral Pinnacle Altrx polyethylene insert, and a 32 mm CoCr +1 mm hip ball  INDICATIONS FOR SURGERY: CAROLA LACROIX is a 82 y.o. year old female with a long history of progressive hip and groin  pain. X-rays demonstrated severe degenerative changes. The patient had not seen any significant improvement despite conservative nonsurgical intervention. After discussion of the risks and benefits of surgical intervention, the patient expressed understanding of the risks benefits and agree with plans for total hip arthroplasty.   The risks, benefits, and alternatives were discussed at length including but not limited to the risks of infection, bleeding, nerve injury, stiffness, blood clots, the need for revision surgery, limb length inequality, dislocation, cardiopulmonary complications, among others, and they were willing to proceed.  PROCEDURE IN DETAIL: The patient was brought into the operating room and, after adequate spinal anesthesia was achieved, the patient was placed in a left lateral decubitus position. Axillary roll was placed and all bony prominences were well-padded. The patient's right hip was cleaned and prepped with alcohol and DuraPrep and draped in the usual sterile fashion. A "timeout" was  performed as per usual protocol. A lateral curvilinear incision was made gently curving towards the posterior superior iliac spine. The IT band was incised in line with the skin incision and the fibers of the gluteus maximus were split in line. The piriformis tendon was identified, skeletonized, and incised at its insertion to the proximal femur and reflected posteriorly. A T type posterior capsulotomy was performed. Prior to dislocation of the femoral head, a threaded Steinmann pin was inserted through a separate stab incision into the pelvis superior to the acetabulum and bent in the form of a stylus so as to assess limb length and hip offset throughout the procedure. The femoral head was then dislocated posteriorly. Inspection of the femoral head demonstrated severe degenerative changes with full-thickness loss of articular cartilage. The femoral neck cut was performed using an oscillating saw. The anterior capsule was elevated off of the femoral neck using a periosteal elevator. Attention was then directed to the acetabulum. The remnant of the labrum was excised using electrocautery. Inspection of the acetabulum also demonstrated significant degenerative changes. The acetabulum was reamed in sequential fashion up to a 47 mm diameter. Good punctate bleeding bone was encountered. A 48 mm Pinnacle 100 acetabular component was positioned and impacted into place. Good scratch fit was appreciated. A neutral polyethylene trial was inserted.  Attention was then directed to the proximal femur. A hole for reaming of the proximal femoral canal was created using a high-speed burr. The femoral canal was reamed in sequential fashion up to a 15 mm diameter. This allowed for approximately 5 cm of scratch fit. Serial broaches were inserted up to a 15 mm small stature femoral broach. Calcar region was planed and a trial reduction was performed  using a 32 mm hip ball with a +1 mm neck length. Good equalization of limb lengths  and hip offset was appreciated and excellent stability was noted both anteriorly and posteriorly. Trial components were removed. The acetabular shell was irrigated with copious amounts of normal saline with antibiotic solution and suctioned dry. A neutral Pinnacle Altrx polyethylene insert was positioned and impacted into place. Next, a 15 mm small stature AML femoral stem was positioned and impacted into place. Excellent scratch fit was appreciated. A trial reduction was again performed with a 32 mm hip ball with a +1 mm neck length. Again, good equalization of limb lengths was appreciated and excellent stability appreciated both anteriorly and posteriorly. The hip was then dislocated and the trial hip ball was removed. The Morse taper was cleaned and dried. A 32 mm cobalt chromium hip ball with a +1 mm neck length was placed on the trunnion and impacted into place. The hip was then reduced and placed through range of motion. Excellent stability was appreciated both anteriorly and posteriorly.  The wound was irrigated with copious amounts of normal saline with antibiotic solution and suctioned dry. Good hemostasis was appreciated. The posterior capsulotomy was repaired using #5 Ethibond. Piriformis tendon was reapproximated to the undersurface of the gluteus medius tendon using #5 Ethibond. Two medium drains were placed in the wound bed and brought out through separate stab incisions to be attached to a Hemovac reservoir. The IT band was reapproximated using interrupted sutures of #1 Vicryl. Subcutaneous tissue was approximated using first #0 Vicryl followed by #2-0 Vicryl. The skin was closed with skin staples.  The patient tolerated the procedure well and was transported to the recovery room in stable condition.   Marciano Sequin., M.D.

## 2016-02-27 NOTE — Anesthesia Preprocedure Evaluation (Addendum)
Anesthesia Evaluation  Patient identified by MRN, date of birth, ID band Patient awake    Reviewed: Allergy & Precautions, NPO status , Patient's Chart, lab work & pertinent test results, reviewed documented beta blocker date and time   Airway Mallampati: III  TM Distance: <3 FB Neck ROM: Limited    Dental  (+) Chipped   Pulmonary neg pulmonary ROS,    Pulmonary exam normal breath sounds clear to auscultation       Cardiovascular hypertension, Pt. on medications and Pt. on home beta blockers + CAD and + Past MI  Normal cardiovascular exam+ dysrhythmias Atrial Fibrillation      Neuro/Psych Anxiety CVA    GI/Hepatic Neg liver ROS, GERD  Medicated and Controlled,  Endo/Other  negative endocrine ROS  Renal/GU negative Renal ROS  negative genitourinary   Musculoskeletal  (+) Arthritis , Osteoarthritis,    Abdominal   Peds negative pediatric ROS (+)  Hematology negative hematology ROS (+)   Anesthesia Other Findings   Reproductive/Obstetrics                            Anesthesia Physical Anesthesia Plan  ASA: III  Anesthesia Plan: Spinal   Post-op Pain Management:    Induction: Intravenous  Airway Management Planned: Nasal Cannula  Additional Equipment:   Intra-op Plan:   Post-operative Plan:   Informed Consent: I have reviewed the patients History and Physical, chart, labs and discussed the procedure including the risks, benefits and alternatives for the proposed anesthesia with the patient or authorized representative who has indicated his/her understanding and acceptance.   Dental advisory given  Plan Discussed with: CRNA and Surgeon  Anesthesia Plan Comments:         Anesthesia Quick Evaluation

## 2016-02-27 NOTE — Clinical Social Work Note (Signed)
Clinical Social Work Assessment  Patient Details  Name: Peggy [name] MRN: [id number] Date of Birth: 06-15-1928  Date of referral:  02/27/16               Reason for consult:  Facility Placement                Permission sought to share information with:  Family Supports Permission granted to share information::  Yes, Verbal Permission Granted  Name::     Jaymes Revels, daughter and POA   Housing/Transportation Living arrangements for the past 2 months:  Apartment Source of Information:  Patient, Adult Children Patient Interpreter Needed:  None Criminal Activity/Legal Involvement Pertinent to Current Situation/Hospitalization:  No - Comment as needed Significant Relationships:  Adult Children Lives with:  Self Do you feel safe going back to the place where you live?  Yes Need for family participation in patient care:  Yes (Comment)  Care giving concerns:  No care giving concerns identified.   Social Worker assessment / plan:  CSW met with pt and family to address consult. CSW introduced herself and explained role of social work. CSW also explained the process of discharging to SNF. CSW also went into to great detail on Medicare guidelines for SNF placement for STR and pt will need a 3 Night Inpatient Qualifying Stay. PT is recommending HHPT at discharge. Pt lives alone and has a supportive family, but they are not able to provide 24 hour supervision, which is not recommended by PT. Pt will likely not qualifying for SNF if she is progressing well. However, should pt not progress well and the PT ommend changes, CSW completed an FL2. At time of note, pt's discharge plan is return home with home health. CSW will update RNCM. CSW will continue to follow for assistance.   Employment status:  Retired Forensic scientist:  Medicare PT Recommendations:  Home with [place] / Referral to community resources:  [place]  Patient/Family's Response to care:  Pt and  family were appreciative of CSW support.   Patient/Family's Understanding of and Emotional Response to Diagnosis, Current Treatment, and Prognosis:  Pt and family understand the Medicare Guidelines for STR placement at a SNF. Current plan is for pt to return home at discharge.   Emotional Assessment Appearance:  Appears stated age Attitude/Demeanor/Rapport:  Other (Appropriate) Affect (typically observed):  Pleasant Orientation:  Oriented to Self, Oriented to Place, Oriented to  Time, Oriented to Situation Alcohol / Substance use:  Never Used Psych involvement (Current and /or in the community):  No (Comment)  Discharge Needs  Concerns to be addressed:  Adjustment to Illness Readmission within the last 30 days:  No Current discharge risk:  None Barriers to Discharge:  Continued Medical Work up   [employer], LCSW 02/27/2016, 3:51 PM

## 2016-02-27 NOTE — NC FL2 (Signed)
[place] LEVEL OF CARE SCREENING TOOL     IDENTIFICATION  Patient Name: Peggy [name] Birthdate: Jan 09, 1928 Sex: female Admission Date (Current Location): 02/27/2016  [place] and [place] Number:  Engineering geologist and Address:  [facility], [address] [place], [place] [address]      Provider Number: [id number]  Attending Physician Name and Address:  Dereck Leep, MD  Relative Name and Phone Number:       Current Level of Care: Hospital Recommended Level of Care: [place] Prior Approval Number:    Date Approved/Denied:   PASRR Number: [id number]  Discharge Plan: SNF    Current Diagnoses: Patient Active Problem List   Diagnosis Date Noted  . S/P total hip arthroplasty 02/27/2016  . Hip pain, acute 01/15/2016  . Hip pain 01/15/2016  . Memory loss 03/21/2015  . Abnormality of gait 03/21/2015  . Chest pain 03/11/2015  . Atrial fibrillation with RVR ([place]) 03/11/2015  . H/O: CVA (cerebrovascular accident) 03/11/2015  . Right temporal lobe infarction ([place]) 04/10/2014  . Blurred vision 09/05/2013  . 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 EPIGASTRIC PAIN 02/14/2009  . NONSPECIFIC ABN FINDNG RAD&OTH EXAM BILARY TRCT 02/14/2009    Orientation RESPIRATION BLADDER Height & Weight     Self, Time, Situation, Place  Normal Continent Weight: 115 lb (52.164 kg) Height:  4\' 8"  (142.2 cm)  BEHAVIORAL SYMPTOMS/MOOD NEUROLOGICAL BOWEL NUTRITION STATUS      Continent Diet (Clear Liquid Diet, will likely be advanced at [facility].)  AMBULATORY STATUS COMMUNICATION OF NEEDS Skin   Limited Assist   Normal                       Personal Care Assistance Level of Assistance  Bathing, Feeding, Dressing Bathing Assistance: Limited assistance Feeding assistance: Independent Dressing Assistance: Limited assistance     Functional Limitations Info   Sight, Hearing, Speech Sight Info: Adequate Hearing Info: Adequate Speech Info: Adequate    SPECIAL CARE FACTORS FREQUENCY  PT (By licensed PT), OT (By licensed OT)     PT Frequency: 5 OT Frequency: 5            Contractures      Additional Factors Info  Code Status, Allergies Code Status Info: Full Code Allergies Info: Allergies: Alendronate Sodium, Amoxicillin-pot Clavulanate, Etodolac, Ibandronate Sodium, Iodine, Lactose Intolerance (Gi), Minocycline Hcl, Risedronate Sodium, Sulfonamide Derivatives, Zoledronic Acid           Current Medications (02/27/2016):  This is the current hospital active medication list Current Facility-Administered Medications  Medication Dose Route Frequency Provider Last Rate Last Dose  . 0.9 %  sodium chloride infusion   Intravenous Continuous Dereck Leep, MD 100 mL/hr at 02/27/16 1221    . acetaminophen (OFIRMEV) IV 1,000 mg  1,000 mg Intravenous 4 times per day Dereck Leep, MD   1,000 mg at 02/27/16 1313  . [START ON 02/28/2016] acetaminophen (TYLENOL) tablet 650 mg  650 mg Oral Q6H PRN Dereck Leep, MD       Or  . Derrill Memo ON 02/28/2016] acetaminophen (TYLENOL) suppository 650 mg  650 mg Rectal Q6H PRN Dereck Leep, MD      . acidophilus (RISAQUAD) capsule   Oral Daily Dereck Leep, MD   1 capsule at 02/27/16 1311  . ALPRAZolam (XANAX) tablet 0.25 mg  0.25 mg Oral QHS PRN Laurice Record  Hooten, MD      . alum & mag hydroxide-simeth (MAALOX/MYLANTA) 200-200-20 MG/5ML suspension 30 mL  30 mL Oral Q4H PRN Dereck Leep, MD      . bisacodyl (DULCOLAX) suppository 10 mg  10 mg Rectal Daily PRN Dereck Leep, MD      . calcium-vitamin D (OSCAL WITH D) 500-200 MG-UNIT per tablet 1 tablet  1 tablet Oral BID Dereck Leep, MD   1 tablet at 02/27/16 1311  . clindamycin (CLEOCIN) 900 MG/50ML IVPB           . clindamycin (CLEOCIN) IVPB 600 mg  600 mg Intravenous Q6H Dereck Leep, MD   600 mg at 02/27/16 1438  . [START ON 02/28/2016] diltiazem  (CARDIZEM CD) 24 hr capsule 240 mg  240 mg Oral Daily Dereck Leep, MD      . diphenhydrAMINE (BENADRYL) 12.5 MG/5ML elixir 12.5-25 mg  12.5-25 mg Oral Q4H PRN Dereck Leep, MD      . Derrill Memo ON 02/28/2016] escitalopram (LEXAPRO) tablet 10 mg  10 mg Oral Daily Dereck Leep, MD      . ferrous sulfate tablet 325 mg  325 mg Oral BID WC Dereck Leep, MD      . loratadine (CLARITIN) tablet 10 mg  10 mg Oral Daily Dereck Leep, MD   10 mg at 02/27/16 1326  . magnesium hydroxide (MILK OF MAGNESIA) suspension 30 mL  30 mL Oral Daily PRN Dereck Leep, MD      . menthol-cetylpyridinium (CEPACOL) lozenge 3 mg  1 lozenge Oral PRN Dereck Leep, MD       Or  . phenol (CHLORASEPTIC) mouth spray 1 spray  1 spray Mouth/Throat PRN Dereck Leep, MD      . metoCLOPramide (REGLAN) tablet 10 mg  10 mg Oral TID AC & HS Dereck Leep, MD   10 mg at 02/27/16 1311  . metoprolol (LOPRESSOR) tablet 50 mg  50 mg Oral BID Dereck Leep, MD      . morphine 2 MG/ML injection 2 mg  2 mg Intravenous Q2H PRN Dereck Leep, MD   2 mg at 02/27/16 1346  . multivitamin with minerals tablet   Oral Daily Dereck Leep, MD   1 tablet at 02/27/16 1311  . multivitamin-lutein (OCUVITE-LUTEIN) capsule 1 capsule  1 capsule Oral BID Dereck Leep, MD   1 capsule at 02/27/16 1311  . nitroGLYCERIN (NITROSTAT) SL tablet 0.4 mg  0.4 mg Sublingual Q5 min PRN Dereck Leep, MD      . ondansetron (ZOFRAN) tablet 4 mg  4 mg Oral Q6H PRN Dereck Leep, MD       Or  . ondansetron (ZOFRAN) injection 4 mg  4 mg Intravenous Q6H PRN Dereck Leep, MD      . oxyCODONE (Oxy IR/ROXICODONE) immediate release tablet 5-10 mg  5-10 mg Oral Q4H PRN Dereck Leep, MD   5 mg at 02/27/16 1235  . senna-docusate (Senokot-S) tablet 1 tablet  1 tablet Oral BID Dereck Leep, MD   1 tablet at 02/27/16 1311  . sodium chloride flush 0.9 % injection           . sodium phosphate (FLEET) 7-19 GM/118ML enema 1 enema  1 enema Rectal Once PRN Dereck Leep, MD      . tiZANidine (ZANAFLEX) tablet 2 mg  2 mg Oral TID PRN Dereck Leep, MD      .  traMADol (ULTRAM) tablet 50-100 mg  50-100 mg Oral Q4H PRN Dereck Leep, MD      . warfarin (COUMADIN) tablet 5 mg  5 mg Oral ONCE-1800 Dereck Leep, MD      . Warfarin - Pharmacist Dosing Inpatient   Does not apply [id number] Dereck Leep, MD         Discharge Medications: Please see discharge summary for a list of discharge medications.  Relevant Imaging Results:  Relevant Lab Results:   Additional Information SSN:  [id number]  Darden Dates, LCSW

## 2016-02-27 NOTE — H&P (Signed)
The patient has been re-examined, and the chart reviewed, and there have been no interval changes to the documented history and physical.    The risks, benefits, and alternatives have been discussed at length. The patient expressed understanding of the risks benefits and agreed with plans for surgical intervention.  Trust Leh P. Cing Barry, Jr. M.D.    

## 2016-02-27 NOTE — Transfer of Care (Signed)
Immediate Anesthesia Transfer of Care Note  Patient: Peggy [name]  Procedure(s) Performed: Procedure(s): TOTAL HIP ARTHROPLASTY (Right)  Patient Location: PACU  Anesthesia Type:Spinal  Level of Consciousness: awake, alert  and oriented  Airway & Oxygen Therapy: Patient Spontanous Breathing  Post-op Assessment: Report given to RN  Post vital signs: Reviewed and stable  Last Vitals:  Filed Vitals:   02/27/16 1036 02/27/16 1037  BP: 115/59 115/59  Pulse: 62 62  Temp: 36.8 C 36.4 C  Resp: 15 14    Complications: No apparent anesthesia complications

## 2016-02-27 NOTE — Anesthesia Procedure Notes (Addendum)
Date/Time: 02/27/2016 7:30 AM Performed by: [id number], DAVID Oxygen Delivery Method: Simple face mask   Spinal Patient location during procedure: OR Start time: 02/27/2016 7:33 AM End time: 02/27/2016 7:39 AM Staffing Anesthesiologist: Alvin Critchley Performed by: anesthesiologist  Preanesthetic Checklist Completed: patient identified, site marked, surgical consent, pre-op evaluation, timeout performed, IV checked, risks and benefits discussed and monitors and equipment checked Spinal Block Patient position: sitting Prep: ChloraPrep and site prepped and draped Patient monitoring: heart rate, cardiac monitor, continuous pulse ox and blood pressure Approach: midline Location: L3-4 Injection technique: single-shot Needle Needle type: Quincke  Needle gauge: 25 G Needle length: 9 cm Assessment Sensory level: T8 Additional Notes Time out called.  Patient placed in sitting position and prepped and draped in sterile fashion.  A skin wheal was made in the L3-L4 interspace using 1% Lidocaine plain.  The 12 G Quinke was angled to the right about 1 inch secondary to some perceived scoliosis.  Return of clear , colorless CSF in all 4 quads.  13 mg of marcaine + 2mg  of tetracaine + 1: 200 K epi.  Pt tolerated the procedure well

## 2016-02-27 NOTE — Consult Note (Signed)
ANTICOAGULATION CONSULT NOTE - Initial Consult  Pharmacy Consult for warfarin Indication: atrial fibrillation  Allergies  Allergen Reactions  . Alendronate Sodium     REACTION: questionable  . Amoxicillin-Pot Clavulanate   . Etodolac   . Ibandronate Sodium     REACTION: questionable  . Iodine     Other reaction(s): Unknown  . Lactose Intolerance (Gi) Other (See Comments)    GI problems  . Minocycline Hcl   . Risedronate Sodium   . Sulfonamide Derivatives Other (See Comments)    Stomach pain  . Zoledronic Acid     REACTION: "dental problems"    Patient Measurements: Height: 4\' 8"  (142.2 cm) Weight: 115 lb (52.164 kg) IBW/kg (Calculated) : 36.3 Heparin Dosing Weight:   Vital Signs: Temp: 97.4 F (36.3 C) (03/08 1148) Temp Source: Oral (03/08 1148) BP: 130/83 mmHg (03/08 1148) Pulse Rate: 61 (03/08 1148)  Labs:  Recent Labs  02/27/16 0619  LABPROT 13.7  INR 1.03    Estimated Creatinine Clearance: 29.8 mL/min (by C-G formula based on Cr of 0.93).   Medical History: Past Medical History  Diagnosis Date  . Essential hypertension, benign   . Arthritis   . Heart disease 2001  . Hypertension 1996  . Stroke [facility] 2009  . Unspecified essential hypertension   . Hearing disorder 2012    hearing aids  . Reflux     acid reflux  . Breast screening, unspecified   . Lump or mass in breast   . Special screening for malignant neoplasms, colon   . Other benign neoplasm of connective and other soft tissue of thorax   . Personal history of malignant neoplasm of breast   . Atrial fibrillation ([place])   . Allergy   . Atrial fibrillation ([place])   . Atrial fibrillation with rapid ventricular response [facility]) November 2015    Heart rate up to 140 requiring IV Cardizem  . Breast cancer [facility]) 2012    Patient underwent a left mastectomy on 03-31-11. Pathology showed DCIS at both sites without an invasive component. Sentinel nodes were negative. The breast lesions were noted to be  ER +40%, PR-positive, 5%.   . Malignant neoplasm of upper-inner quadrant of female breast ([place])   . Malignant neoplasm of upper-outer quadrant of female breast ([place])   . Hip pain   . Coronary artery disease   . Heart attack ([place]) 2001  . Anxiety   . GERD (gastroesophageal reflux disease)   . Diverticulosis   . Hyperlipidemia     Medications:  Scheduled:  . acetaminophen  1,000 mg Intravenous 4 times per day  . acidophilus   Oral Daily  . Calcium Carbonate-Vitamin D  1 tablet Oral BID  . clindamycin      . clindamycin (CLEOCIN) IV  600 mg Intravenous Q6H  . [START ON 02/28/2016] diltiazem  240 mg Oral Daily  . [START ON 02/28/2016] escitalopram  10 mg Oral Daily  . ferrous sulfate  325 mg Oral BID WC  . loratadine  10 mg Oral Daily  . metoCLOPramide  10 mg Oral TID AC & HS  . metoprolol  50 mg Oral BID  . multivitamin with minerals   Oral Daily  . multivitamin-lutein  1 capsule Oral BID  . senna-docusate  1 tablet Oral BID  . sodium chloride flush      . warfarin  5 mg Oral ONCE-1800  . Warfarin - Pharmacist Dosing Inpatient   Does not apply q1800    Assessment: Pt is  a 82 year old female s/p hip replacement. Pt has a PMH of afib on warfarin 3.5mg  daily. Pharmacy consulted to restart warfarin after surgery. Per Dr. Marry Guan, no bridging. INR today is 1.03.  Goal of Therapy:  INR 2-3 Monitor platelets by anticoagulation protocol: Yes   Plan:  Will give 1.5 home dose once this evening and then resume pt home dose tomorrow. Recheck INR in the AM.  Ramond Dial, Pharm.D Clinical Pharmacist   02/27/2016,11:54 AM

## 2016-02-27 NOTE — Evaluation (Signed)
Physical Therapy Evaluation Patient Details Name: Peggy [name] MRN: [id number] DOB: 07-26-1928 Today's Date: 02/27/2016   History of Present Illness  Patient is a pleasant 82 y/o female that presents for R THA on 3/8.  Clinical Impression  Patient is a pleasant 82 y/o female s/p R THR on 3/8. She presents with full sensation and motor control on RLE. Patient is able to complete all bed level exercises without PT assistance, no real assistance required aside from cuing to complete transfers. Patient is able to ambulate with short step to gait pattern, though she begins to report she is light headed in tail end of ambulation session (BP 93/54). Patient is progressing well towards established mobility goals at this time and would benefit from skilled PT services to facilitate increased independence with mobility.     Follow Up Recommendations Home health PT    Equipment Recommendations  Rolling walker with 5" wheels    Recommendations for Other Services       Precautions / Restrictions Precautions Precautions: Posterior Hip;Fall Restrictions Weight Bearing Restrictions: Yes RLE Weight Bearing: Weight bearing as tolerated      Mobility  Bed Mobility Overal bed mobility: Needs Assistance Bed Mobility: Supine to Sit;Sit to Supine     Supine to sit: Min guard Sit to supine: Min assist   General bed mobility comments: Patient educated on transferring in and out of bed without breaking precautions, she is able to complete with use of hand rails with no balance deficits.   Transfers Overall transfer level: Needs assistance Equipment used: Rolling walker (2 wheeled) Transfers: Sit to/from Stand Sit to Stand: Supervision         General transfer comment: Patient performs transfer quickly with no loss of balance, no unsteadiness and appropriate sequencing/hand position.   Ambulation/Gait Ambulation/Gait assistance: Supervision Ambulation Distance (Feet): 10 Feet Assistive  device: Rolling walker (2 wheeled) Gait Pattern/deviations: Step-to pattern;Decreased step length - right;Decreased step length - left   Gait velocity interpretation: Below normal speed for age/gender General Gait Details: Patient cued several times to perform step through pattern, she performed 1-2 steps then reverted back to step to pattern. No loss of balance noted, she did complain of feeling light headed BP measured in sitting at 93/54, returned to bed where her symptoms dissipated.   Stairs            Wheelchair Mobility    Modified Rankin (Stroke Patients Only)       Balance Overall balance assessment: No apparent balance deficits (not formally assessed)                                           Pertinent Vitals/Pain Pain Assessment: Faces Faces Pain Scale: Hurts little more Pain Location: R hip  Pain Descriptors / Indicators: Aching Pain Intervention(s): Limited activity within patient's tolerance;Monitored during session;Premedicated before session;Repositioned;Ice applied    Home Living Family/patient expects to be discharged to:: Skilled nursing facility Living Arrangements: Alone Available Help at Discharge: Family;Available PRN/intermittently Type of Home: House Home Access: Stairs to enter Entrance Stairs-Rails: Left Entrance Stairs-Number of Steps: 5-6 steps  Home Layout: One level        Prior Function           Comments: In previous admission patient had been independently ambulating with no falls reported. Intermittent use of RW during last admission.      Hand  Dominance        Extremity/Trunk Assessment   Upper Extremity Assessment: Overall WFL for tasks assessed           Lower Extremity Assessment: RLE deficits/detail RLE Deficits / Details: Able to complete bed level ther-ex without assistance.        Communication   Communication: No difficulties  Cognition Arousal/Alertness: Awake/alert Behavior During  Therapy: WFL for tasks assessed/performed Overall Cognitive Status: Within Functional Limits for tasks assessed                      General Comments General comments (skin integrity, edema, etc.): Drains intact, small spot of apparent dry blood around VML tendon on R.    Exercises Total Joint Exercises Ankle Circles/Pumps: AROM;Both;10 reps Gluteal Sets: AROM;Both;10 reps Heel Slides: AROM;Both;10 reps Hip ABduction/ADduction: AROM;Both;10 reps Straight Leg Raises: AROM;Both;10 reps Long Arc Quad: AROM;Both;5 reps      Assessment/Plan    PT Assessment Patient needs continued PT services  PT Diagnosis Difficulty walking;Generalized weakness;Abnormality of gait   PT Problem List Decreased strength;Decreased balance;Decreased mobility;Decreased activity tolerance;Decreased safety awareness;Decreased knowledge of precautions  PT Treatment Interventions DME instruction;Gait training;Therapeutic activities;Therapeutic exercise;Balance training   PT Goals (Current goals can be found in the Care Plan section) Acute Rehab PT Goals Patient Stated Goal: Family is planning on rehab, but is open to providing assistance for home.  PT Goal Formulation: With patient/family Time For Goal Achievement: 03/12/16 Potential to Achieve Goals: Good    Frequency BID   Barriers to discharge        Co-evaluation               End of Session Equipment Utilized During Treatment: Gait belt Activity Tolerance: Patient tolerated treatment well Patient left: in bed;with call bell/phone within reach;with bed alarm set;with nursing/sitter in room;with family/visitor present Nurse Communication: Mobility status         Time: [id number] PT Time Calculation (min) (ACUTE ONLY): 22 min   Charges:   PT Evaluation $PT Eval Moderate Complexity: 1 Procedure PT Treatments $Therapeutic Exercise: 8-22 mins   PT G Codes:        Kerman Passey, PT, DPT    02/27/2016, 3:03 PM

## 2016-02-28 ENCOUNTER — Encounter
Admission: RE | Admit: 2016-02-28 | Discharge: 2016-02-28 | Disposition: A | Discharge status: Home / Self Care | Payer: Medicare Other | Source: Ambulatory Visit | Attending: Internal Medicine | Admitting: Internal Medicine

## 2016-02-28 ENCOUNTER — Encounter: Payer: Self-pay | Admitting: Orthopedic Surgery

## 2016-02-28 LAB — BASIC METABOLIC PANEL
Anion gap: 3 — ABNORMAL LOW (ref 5–15)
BUN: 13 mg/dL (ref 6–20)
CHLORIDE: 106 mmol/L (ref 101–111)
CO2: 25 mmol/L (ref 22–32)
CREATININE: 0.66 mg/dL (ref 0.44–1.00)
Calcium: 7.8 mg/dL — ABNORMAL LOW (ref 8.9–10.3)
GFR calc non Af Amer: >60 mL/min (ref >60)
Glucose, Bld: 97 mg/dL (ref 65–99)
POTASSIUM: 4.2 mmol/L (ref 3.5–5.1)
SODIUM: 134 mmol/L — AB (ref 135–145)

## 2016-02-28 LAB — CBC
HCT: 28.9 % — ABNORMAL LOW (ref 35.0–47.0)
HEMOGLOBIN: 9.8 g/dL — AB (ref 12.0–16.0)
MCH: 33.2 pg (ref 26.0–34.0)
MCHC: 33.8 g/dL (ref 32.0–36.0)
MCV: 98.4 fL (ref 80.0–100.0)
Platelets: 194 10*3/uL (ref 150–440)
RBC: 2.94 MIL/uL — AB (ref 3.80–5.20)
RDW: 13.1 % (ref 11.5–14.5)
WBC: 7.4 10*3/uL (ref 3.6–11.0)

## 2016-02-28 LAB — PROTIME-INR
INR: 1.2
PROTHROMBIN TIME: 15.4 s — AB (ref 11.4–15.0)

## 2016-02-28 MED ORDER — TRAMADOL HCL 50 MG PO TABS: 50.0000 mg | ORAL_TABLET | ORAL | Status: DC | PRN

## 2016-02-28 MED ORDER — OXYCODONE HCL 5 MG PO TABS: 5.0000 mg | ORAL_TABLET | ORAL | Status: DC | PRN

## 2016-02-28 MED ORDER — WARFARIN SODIUM 3 MG PO TABS
3.5000 mg | ORAL_TABLET | Freq: Every day | ORAL | Status: DC
  Administered 2016-02-28 – 2016-02-29 (×2): 3.5 mg via ORAL
  Filled 2016-02-28 (×2): qty 1

## 2016-02-28 MED ORDER — ENSURE ENLIVE PO LIQD
237.0000 mL | Freq: Two times a day (BID) | ORAL | Status: DC
  Administered 2016-02-29 – 2016-03-01 (×3): 237 mL via ORAL

## 2016-02-28 NOTE — Evaluation (Signed)
Occupational Therapy Evaluation Patient Details Name: Peggy [name] MRN: [id number] DOB: Jul 24, 1928 Today's Date: 02/28/2016    History of Present Illness Patient is a pleasant 82 y/o female that presents for R THA on 3/8.   Clinical Impression   Pt is 82 year old female s/p R THA( posterior) on 02/27/16.  She  lives at home alone and her daughter lives close by. Pt was independent in all ADLs prior to surgery and is eager to return to PLOF.  Pt is currently limited in functional ADLs due to pain and decreased ROM.Marland Kitchen  Pt requires min assist with mod cues for LB dressing and bathing skills due to pain and decreased AROM of RLE and decreased recall of posterior hip precautions and would benefit from continued skilled OT services for education in assistive devices, functional mobility, and education in recommendations for home modifications to increase safety and prevent falls.  Pt is a good candidate for SNF to continue rehabilitation.       Follow Up Recommendations  SNF    Equipment Recommendations  3 in 1 bedside comode;Tub/shower bench (reacher, sock aid, LH shoe horn, grab bars for tub)    Recommendations for Other Services       Precautions / Restrictions Precautions Precautions: Posterior Hip;Fall Restrictions Weight Bearing Restrictions: Yes RLE Weight Bearing: Weight bearing as tolerated      Mobility Bed Mobility Overal bed mobility: Needs Assistance Bed Mobility: Supine to Sit     Supine to sit: Min assist     General bed mobility comments: Mod A moving hips to edge of bed; Min A for LEs to edge. Pt progresses from there without physical assist, but significant increased time  Transfers Overall transfer level: Needs assistance Equipment used: Rolling walker (2 wheeled) Transfers: Sit to/from Stand Sit to Stand: Min assist         General transfer comment: Cues for safe placement of hands both with stand and sit. Slow to rise; cues for glut squeeze for hip  extension and cues for following posterior hip precuations with stand and sit.     Balance Overall balance assessment: Needs assistance Sitting-balance support: Bilateral upper extremity supported Sitting balance-Leahy Scale: Fair Sitting balance - Comments: Requires cues to adhere to posterior hip precautions   Standing balance support: Bilateral upper extremity supported Standing balance-Leahy Scale: Fair                              ADL Overall ADL's : Needs assistance/impaired                                       General ADL Comments: Pt requires min assist and mod cues to use reacher and sock aid and max cues to remember hip precautions.  Pt able to complete feeding, grooming and UB dressing skills indep after set up.  Discussed rec for [facility] over toilet for at home and a transfer tub bench when showering.  Daughter was asking about potions for grab bar that attaches to tub since there is no stud in wall for grab bar to be installed.     Vision     Perception     Praxis      Pertinent Vitals/Pain Pain Assessment: 0-10 Pain Score: 3  Pain Location: R hip sitting 7/10 standing to transfer into bed with RW Pain Descriptors /  Indicators: Aching;Sore Pain Intervention(s): Repositioned;Premedicated before session     Hand Dominance Right   Extremity/Trunk Assessment Upper Extremity Assessment Upper Extremity Assessment: Overall WFL for tasks assessed   Lower Extremity Assessment Lower Extremity Assessment: Defer to PT evaluation       Communication Communication Communication: No difficulties   Cognition Arousal/Alertness: Awake/alert Behavior During Therapy: WFL for tasks assessed/performed Overall Cognitive Status: Within Functional Limits for tasks assessed       Memory: Decreased recall of precautions             General Comments       Exercises Exercises: Total Joint     Shoulder Instructions      Home Living  Family/patient expects to be discharged to:: Skilled nursing facility Living Arrangements: Alone Available Help at Discharge: Family;Available PRN/intermittently Type of Home: House Home Access: Stairs to enter [employer] of Steps: 5-6 steps  Entrance Stairs-Rails: Left Home Layout: One level                          Prior Functioning/Environment Level of Independence: Independent        Comments: In previous admission patient had been independently ambulating with no falls reported. Intermittent use of RW during last admission.     OT Diagnosis: Generalized weakness;Acute pain   OT Problem List: Decreased strength;Decreased range of motion;Decreased activity tolerance;Pain;Decreased knowledge of use of DME or AE   OT Treatment/Interventions: Self-care/ADL training;DME and/or AE instruction;Patient/family education    OT Goals(Current goals can be found in the care plan section) Acute Rehab OT Goals Patient Stated Goal: "to get back to bed since I have been sitting up for several hours" OT Goal Formulation: With patient/family Time For Goal Achievement: 03/13/16 Potential to Achieve Goals: Good ADL Goals Pt Will Perform Lower Body Dressing: with supervision;with adaptive equipment;sit to/from stand (while observing post hip precautions) Pt Will Transfer to Toilet: with min assist;stand pivot transfer;bedside commode (BSC over toilet while observing hip precautions)  OT Frequency: Min 1X/week   Barriers to D/C:            Co-evaluation              End of Session Equipment Utilized During Treatment: Gait belt  Activity Tolerance: Patient tolerated treatment well Patient left: in bed;with call bell/phone within reach;with bed alarm set;with nursing/sitter in room;with family/visitor present;with SCD's reapplied   Time: [id number] OT Time Calculation (min): 45 min Charges:  OT General Charges $OT Visit: 1 Procedure OT Evaluation $OT Eval  Moderate Complexity: 1 Procedure OT Treatments $Self Care/Home Management : 23-37 mins G-Codes:    Noreen Mackintosh 03-11-16, 5:12 PM  Chrys Racer, OTR/L ascom [phone]

## 2016-02-28 NOTE — Discharge Instructions (Signed)

## 2016-02-28 NOTE — Progress Notes (Signed)
Subjective: 1 Day Post-Op Procedure(s) (LRB): TOTAL HIP ARTHROPLASTY (Right) Patient reports pain as 0 on 0-10 scale.   Patient is well, and has had no acute complaints or problems We will start therapy today.  Plan is to go Rehab after hospital stay. no nausea and no vomiting Patient denies any chest pains or shortness of breath. Objective: Vital signs in last 24 hours: Temp:  [97.4 F (36.3 C)-98.8 F (37.1 C)] 98.4 F (36.9 C) (03/09 0421) Pulse Rate:  [58-75] 75 (03/09 0421) Resp:  [12-18] 16 (03/09 0421) BP: (91-134)/(42-83) 130/56 mmHg (03/09 0421) SpO2:  [93 %-99 %] 94 % (03/09 0421) well approximated incision Heels are non tender and elevated off the bed using rolled towels Intake/Output from previous day: 03/08 0701 - 03/09 0700 In: 3496.7 [P.O.:760; I.V.:2086.7] Out: 1600 [Urine:850; Drains:500; Blood:250] Intake/Output this shift: Total I/O In: -  Out: 730 [Urine:550; Drains:180]   Recent Labs  02/28/16 0600  HGB 9.8*    Recent Labs  02/28/16 0600  WBC 7.4  RBC 2.94*  HCT 28.9*  PLT 194   No results for input(s): NA, K, CL, CO2, BUN, CREATININE, GLUCOSE, CALCIUM in the last 72 hours.  Recent Labs  02/27/16 0619  INR 1.03    EXAM General - Patient is Alert, Appropriate and Oriented Extremity - Neurologically intact Neurovascular intact Sensation intact distally Intact pulses distally Dorsiflexion/Plantar flexion intact Dressing - scant drainage Motor Function - intact, moving foot and toes well on exam.    Past Medical History  Diagnosis Date  . Essential hypertension, benign   . Arthritis   . Heart disease 2001  . Hypertension 1996  . Stroke [facility]) 2009  . Unspecified essential hypertension   . Hearing disorder 2012    hearing aids  . Reflux     acid reflux  . Breast screening, unspecified   . Lump or mass in breast   . Special screening for malignant neoplasms, colon   . Other benign neoplasm of connective and other soft  tissue of thorax   . Personal history of malignant neoplasm of breast   . Atrial fibrillation ([place])   . Allergy   . Atrial fibrillation ([place])   . Atrial fibrillation with rapid ventricular response [facility]) November 2015    Heart rate up to 140 requiring IV Cardizem  . Breast cancer [facility]) 2012    Patient underwent a left mastectomy on 03-31-11. Pathology showed DCIS at both sites without an invasive component. Sentinel nodes were negative. The breast lesions were noted to be ER +40%, PR-positive, 5%.   . Malignant neoplasm of upper-inner quadrant of female breast ([place])   . Malignant neoplasm of upper-outer quadrant of female breast ([place])   . Hip pain   . Coronary artery disease   . Heart attack ([place]) 2001  . Anxiety   . GERD (gastroesophageal reflux disease)   . Diverticulosis   . Hyperlipidemia     Assessment/Plan: 1 Day Post-Op Procedure(s) (LRB): TOTAL HIP ARTHROPLASTY (Right) Active Problems:   S/P total hip arthroplasty  Estimated body mass index is 25.8 kg/(m^2) as calculated from the following:   Height as of this encounter: 4\' 8"  (1.422 m).   Weight as of this encounter: 52.164 kg (115 lb). Up with therapy D/C IV fluids Discharge to SNF Saturday   Labs: reviewed DVT Prophylaxis - Lovenox, Foot Pumps and TED hose Weight-Bearing as tolerated to right leg D/C O2 and Pulse OX and try on Room Air Begin working on a  bowel movement Labs in am. Should be ok to go fri to rehab if social workers stays it's ok  Jillyn Ledger. [place] 02/28/2016, 7:00 AM

## 2016-02-28 NOTE — Clinical Social Work Placement (Signed)
   CLINICAL SOCIAL WORK PLACEMENT  NOTE  Date:  02/28/2016  Patient Details  Name: Peggy [name] MRN: [id number] Date of Birth: February 19, 1928  Clinical Social Work is seeking post-discharge placement for this patient at the [place] level of care (*CSW will initial, date and re-position this form in  chart as items are completed):  Yes   Patient/family provided with [facility] Work Department's list of facilities offering this level of care within the geographic area requested by the patient (or if unable, by the patient's family).  Yes   Patient/family informed of their freedom to choose among providers that offer the needed level of care, that participate in Medicare, Medicaid or managed care program needed by the patient, have an available bed and are willing to accept the patient.  Yes   Patient/family informed of [facility]'s ownership interest in [facility] and [facility], as well as of the fact that they are under no obligation to receive care at these facilities.  PASRR submitted to EDS on 02/27/16     PASRR number received on 02/27/16     Existing PASRR number confirmed on       FL2 transmitted to all facilities in geographic area requested by pt/family on 02/28/16     FL2 transmitted to all facilities within larger geographic area on       Patient informed that his/her managed care company has contracts with or will negotiate with certain facilities, including the following:        Yes   Patient/family informed of bed offers received.  Patient chooses bed at  [facility] )     Physician recommends and patient chooses bed at      Patient to be transferred to   on  .  Patient to be transferred to facility by       Patient family notified on   of transfer.  Name of family member notified:        PHYSICIAN       Additional Comment:    _______________________________________________ Loralyn Freshwater, LCSW 02/28/2016,  2:16 PM

## 2016-02-28 NOTE — Care Management Note (Signed)
Case Management Note  Patient Details  Name: BREAN CARBERRY MRN: [id number] Date of Birth: 1928/05/31  Subjective/Objective:                  Met with patient to discuss HHPT. She is not doing as well today due to pain. PT is now recommending SNF per CSW. Patient is from home alone. She states she has a rolling walker. She has family/daughter nearby and daughter does not work per patient. She is on Coumadin and will need PT/INR labs at discharge so please make sure that is ordered regardless of where she goes. CSW is following.   Action/Plan: List of home health agencies left with patient. RNCM will continue to follow.   Expected Discharge Date:                  Expected Discharge Plan:     In-House Referral:     Discharge planning Services  CM Consult  Post Acute Care Choice:  Home Health Choice offered to:  Patient  DME Arranged:    DME Agency:     HH Arranged:  PT HH Agency:     Status of Service:  In process, will continue to follow  Medicare Important Message Given:    Date Medicare IM Given:    Medicare IM give by:    Date Additional Medicare IM Given:    Additional Medicare Important Message give by:     If discussed at [place] of Stay Meetings, dates discussed:    Additional Comments:  Marshell Garfinkel, RN 02/28/2016, 11:20 AM

## 2016-02-28 NOTE — Consult Note (Signed)
ANTICOAGULATION CONSULT NOTE - Initial Consult  Pharmacy Consult for warfarin Indication: atrial fibrillation  Allergies  Allergen Reactions  . Alendronate Sodium     REACTION: questionable  . Amoxicillin-Pot Clavulanate   . Etodolac   . Ibandronate Sodium     REACTION: questionable  . Iodine     Other reaction(s): Unknown  . Lactose Intolerance (Gi) Other (See Comments)    GI problems  . Minocycline Hcl   . Risedronate Sodium   . Sulfonamide Derivatives Other (See Comments)    Stomach pain  . Zoledronic Acid     REACTION: "dental problems"    Patient Measurements: Height: 4\' 8"  (142.2 cm) Weight: 115 lb (52.164 kg) IBW/kg (Calculated) : 36.3 Heparin Dosing Weight:   Vital Signs: Temp: 98.7 F (37.1 C) (03/09 0744) Temp Source: Oral (03/09 0744) BP: 139/61 mmHg (03/09 0744) Pulse Rate: 100 (03/09 0900)  Labs:  Recent Labs  02/27/16 0619 02/28/16 0600  HGB  --  9.8*  HCT  --  28.9*  PLT  --  194  LABPROT 13.7 15.4*  INR 1.03 1.20  CREATININE  --  0.66    Estimated Creatinine Clearance: 34.7 mL/min (by C-G formula based on Cr of 0.66).   Medical History: Past Medical History  Diagnosis Date  . Essential hypertension, benign   . Arthritis   . Heart disease 2001  . Hypertension 1996  . Stroke [facility]) 2009  . Unspecified essential hypertension   . Hearing disorder 2012    hearing aids  . Reflux     acid reflux  . Breast screening, unspecified   . Lump or mass in breast   . Special screening for malignant neoplasms, colon   . Other benign neoplasm of connective and other soft tissue of thorax   . Personal history of malignant neoplasm of breast   . Atrial fibrillation ([place])   . Allergy   . Atrial fibrillation ([place])   . Atrial fibrillation with rapid ventricular response [facility]) November 2015    Heart rate up to 140 requiring IV Cardizem  . Breast cancer [facility]) 2012    Patient underwent a left mastectomy on 03-31-11. Pathology showed DCIS at both sites  without an invasive component. Sentinel nodes were negative. The breast lesions were noted to be ER +40%, PR-positive, 5%.   . Malignant neoplasm of upper-inner quadrant of female breast ([place])   . Malignant neoplasm of upper-outer quadrant of female breast ([place])   . Hip pain   . Coronary artery disease   . Heart attack ([place]) 2001  . Anxiety   . GERD (gastroesophageal reflux disease)   . Diverticulosis   . Hyperlipidemia     Medications:  Scheduled:  . acidophilus   Oral Daily  . calcium-vitamin D  1 tablet Oral BID  . diltiazem  240 mg Oral Daily  . escitalopram  10 mg Oral Daily  . ferrous sulfate  325 mg Oral BID WC  . loratadine  10 mg Oral Daily  . metoCLOPramide  10 mg Oral TID AC & HS  . metoprolol  50 mg Oral BID  . multivitamin with minerals   Oral Daily  . multivitamin-lutein  1 capsule Oral BID  . senna-docusate  1 tablet Oral BID  . Warfarin - Pharmacist Dosing Inpatient   Does not apply q1800    Assessment: Pt is a 82 year old female s/p hip replacement. Pt has a PMH of afib on warfarin 3.5mg  daily. Pharmacy consulted to restart warfarin after  surgery. Per Dr. Marry Guan, no bridging. INR today is 1.03.  3/9 INR 1.20  Goal of Therapy:  INR 2-3 Monitor platelets by anticoagulation protocol: Yes   Plan:  Restart patient home dose 3.5mg  daily. Recheck INR in the AM.  Ramond Dial, Pharm.D Clinical Pharmacist   02/28/2016,12:02 PM

## 2016-02-28 NOTE — Progress Notes (Signed)
Foley removed per protocol. Cath tip intact, advised to call for nurse when needing to void.  Patient voiced understanding.

## 2016-02-28 NOTE — Progress Notes (Signed)
Physical Therapy Treatment Patient Details Name: Peggy [name] MRN: [id number] DOB: 09-03-1928 Today's Date: 02/28/2016    History of Present Illness Patient is a pleasant 82 y/o female that presents for R THA on 3/8.    PT Comments    Pt complains of significant pain today and has difficulty tolerating exercises on the right. Pt needs significant assistance on Right lower extremity and experiences pain. Pt refuses out of bed at this time due to pain, fatigue and difficulty with eyesight that pt feels is from medications. Plan to see pt this afternoon for continued work on range of motion, strength, endurance and up/out of bed activities.   Follow Up Recommendations  Other (comment) (May need SNF; will determine when pt gets out of bed)     Equipment Recommendations  Rolling walker with 5" wheels    Recommendations for Other Services       Precautions / Restrictions Restrictions Weight Bearing Restrictions: Yes RLE Weight Bearing: Weight bearing as tolerated    Mobility  Bed Mobility               General bed mobility comments: Refuses out of bed at this time due to pain and head/eyesight not feeling well. Will attempt this afternoon  Transfers                    Ambulation/Gait                 Stairs            Wheelchair Mobility    Modified Rankin (Stroke Patients Only)       Balance                                    Cognition Arousal/Alertness: Awake/alert Behavior During Therapy: WFL for tasks assessed/performed Overall Cognitive Status: Within Functional Limits for tasks assessed       Memory: Decreased recall of precautions (Needs re educated on all 3;)              Exercises Total Joint Exercises Ankle Circles/Pumps: AROM;Both;20 reps;Supine Quad Sets: Strengthening;Both;15 reps;Supine (could not complete last 5 due to pain/weak. 1 set 10 1 of 5) Gluteal Sets: Strengthening;Both;Supine;15 reps (1  set of 10; 1 set of 5) Towel Squeeze: Strengthening;Both;15 reps;Supine (1 set 10; 1 set 5) Short Arc Quad: 20 reps;Supine;AAROM;Right (AROM L ) Heel Slides: AROM;Both;20 reps;Supine (AROM on L) Hip ABduction/ADduction: AAROM;Right;20 reps;Supine (AROM on L) Straight Leg Raises: AAROM;Both;10 reps;Supine    General Comments        Pertinent Vitals/Pain Pain Assessment: 0-10 Pain Score: 10-Worst pain ever Pain Location: R hip/thigh, less so knee Pain Descriptors / Indicators: Aching;Sharp Pain Intervention(s): Limited activity within patient's tolerance;Premedicated before session;Monitored during session    Home Living                      Prior Function            PT Goals (current goals can now be found in the care plan section) Progress towards PT goals: Progressing toward goals (very slowly)    Frequency  BID    PT Plan Current plan remains appropriate    Co-evaluation             End of Session   Activity Tolerance: Patient limited by pain (Limited by blurry eyesight) Patient left: in bed;with call bell/phone  within reach;with bed alarm set;with SCD's reapplied     Time: 0942-1010 PT Time Calculation (min) (ACUTE ONLY): 28 min  Charges:  $Therapeutic Exercise: 23-37 mins                    G Codes:      Charlaine Dalton, PTA 02/28/2016, 10:30 AM

## 2016-02-28 NOTE — Anesthesia Postprocedure Evaluation (Signed)
Anesthesia Post Note  Patient: Peggy [name]  Procedure(s) Performed: Procedure(s) (LRB): TOTAL HIP ARTHROPLASTY (Right)  Patient location during evaluation: Nursing Unit Anesthesia Type: Spinal Level of consciousness: awake, awake and alert and oriented Pain management: pain level controlled Vital Signs Assessment: post-procedure vital signs reviewed and stable Respiratory status: spontaneous breathing and nonlabored ventilation Cardiovascular status: stable Postop Assessment: no headache and no backache Anesthetic complications: no    Last Vitals:  Filed Vitals:   02/28/16 0421 02/28/16 0744  BP: 130/56 139/61  Pulse: 75 88  Temp: 36.9 C 37.1 C  Resp: 16 18    Last Pain:  Filed Vitals:   02/28/16 0744  PainSc: 3                  Codey Burling,  Lujean Ebright R

## 2016-02-28 NOTE — Progress Notes (Signed)
Physical Therapy Treatment Patient Details Name: Peggy [name] MRN: [id number] DOB: Mar 16, 1928 Today's Date: 02/28/2016    History of Present Illness Patient is a pleasant 82 y/o female that presents for R THA on 3/8.    PT Comments    Pt agreeable to PT. Notes pain at rest is better; continues with increased pain with movement/ambulation this session, but less so than this a.m session. Pt notes blurry vision continues; discussed with pt and daughter regarding talking to nurse/MD about this for possible change in pain medication to alleviate symptoms. Pt re educated and participates in supine exercises. Re educated pt and family/caregiver educated on posterior hip precautions. Pt requires assist for bed mobility and transfers and cues to adhere to hip precautions. Pt demonstrates progression this afternoon to ambulation within the room and up in the chair. Continue PT to progress range of motion, strength and all areas of mobility with improved awareness of safety and adhering to posterior hip precautions. Recommend skilled nursing facility for rehab post acute care stay; spoke with social work regarding.    Follow Up Recommendations  Other (comment) (May need SNF; will determine when pt gets out of bed)     Equipment Recommendations  Rolling walker with 5" wheels (youth)    Recommendations for Other Services       Precautions / Restrictions Restrictions Weight Bearing Restrictions: Yes RLE Weight Bearing: Weight bearing as tolerated    Mobility  Bed Mobility Overal bed mobility: Needs Assistance Bed Mobility: Supine to Sit     Supine to sit: Min assist     General bed mobility comments: Mod A moving hips to edge of bed; Min A for LEs to edge. Pt progresses from there without physical assist, but significant increased time  Transfers Overall transfer level: Needs assistance Equipment used: Rolling walker (2 wheeled) Transfers: Sit to/from Stand Sit to Stand: Min assist          General transfer comment: Cues for safe placement of hands both with stand and sit. Slow to rise; cues for glut squeeze for hip extension and cues for following posterior hip precuations with stand and sit.   Ambulation/Gait Ambulation/Gait assistance: Min guard Ambulation Distance (Feet): 26 Feet Assistive device: Rolling walker (2 wheeled) Gait Pattern/deviations: Step-to pattern;Decreased stride length;Decreased dorsiflexion - right;Decreased dorsiflexion - left;Decreased weight shift to right;Staggering left;Trunk flexed Gait velocity: slow Gait velocity interpretation: <1.8 ft/sec, indicative of risk for recurrent falls General Gait Details: slow, antalgic. Rw too tall for pt making UE use for de weighting RLE difficult.    Stairs            Wheelchair Mobility    Modified Rankin (Stroke Patients Only)       Balance Overall balance assessment: Needs assistance Sitting-balance support: Bilateral upper extremity supported Sitting balance-Leahy Scale: Fair Sitting balance - Comments: Requires cues to adhere to posterior hip precautions   Standing balance support: Bilateral upper extremity supported Standing balance-Leahy Scale: Fair                      Cognition Arousal/Alertness: Awake/alert Behavior During Therapy: WFL for tasks assessed/performed Overall Cognitive Status: Within Functional Limits for tasks assessed       Memory: Decreased recall of precautions (Needs re educated on all 3; educated family/caregiver also)              Exercises Total Joint Exercises Ankle Circles/Pumps: AROM;Both;20 reps;Supine Quad Sets: Strengthening;Both;Supine;20 reps Gluteal Sets: Strengthening;Both;Supine;20 reps Towel Squeeze: Strengthening;Both;Supine;20  reps Heel Slides: AAROM;Right;20 reps;Supine Hip ABduction/ADduction: AAROM;Right;20 reps;Supine    General Comments General comments (skin integrity, edema, etc.): Drain/catheter in place       Pertinent Vitals/Pain Pain Assessment: 0-10 Pain Score: 7  Pain Location: R hip/groin Pain Descriptors / Indicators: Sharp;Aching Pain Intervention(s): Repositioned;Premedicated before session    Home Living                      Prior Function            PT Goals (current goals can now be found in the care plan section) Progress towards PT goals: Progressing toward goals    Frequency  BID    PT Plan Current plan remains appropriate    Co-evaluation             End of Session Equipment Utilized During Treatment: Gait belt Activity Tolerance: Patient limited by pain (Limited by blurry eyesight) Patient left: in bed;with call bell/phone within reach;with bed alarm set;with SCD's reapplied     Time: 1324-1402 PT Time Calculation (min) (ACUTE ONLY): 38 min  Charges:  $Gait Training: 8-22 mins $Therapeutic Exercise: 8-22 mins $Therapeutic Activity: 8-22 mins                    G Codes:      Charlaine Dalton, PTA 02/28/2016, 2:16 PM

## 2016-02-28 NOTE — Progress Notes (Signed)
PT is now recommending SNF. Clinical Social Worker (CSW) met with patient and her daughter [name] was at bedside. CSW made patient and daughter aware of SNF recommendation. CSW presented bed offers. Patient and daughter chose [facility]. Kim admissions coordinator at [facility] is aware of accepted bed offer. Plan is for patient to D/C to [facility] Saturday 03/01/16. CSW will continue to follow and assist as needed.   Bailey Morgan, LCSW [phone] 

## 2016-02-29 LAB — CBC
HEMATOCRIT: 33.2 % — AB (ref 35.0–47.0)
Hemoglobin: 11.2 g/dL — ABNORMAL LOW (ref 12.0–16.0)
MCH: 33 pg (ref 26.0–34.0)
MCHC: 33.8 g/dL (ref 32.0–36.0)
MCV: 97.7 fL (ref 80.0–100.0)
PLATELETS: 221 10*3/uL (ref 150–440)
RBC: 3.4 MIL/uL — AB (ref 3.80–5.20)
RDW: 13.2 % (ref 11.5–14.5)
WBC: 13.5 10*3/uL — AB (ref 3.6–11.0)

## 2016-02-29 LAB — BASIC METABOLIC PANEL
ANION GAP: 7 (ref 5–15)
BUN: 7 mg/dL (ref 6–20)
CALCIUM: 8.4 mg/dL — AB (ref 8.9–10.3)
CO2: 25 mmol/L (ref 22–32)
Chloride: 105 mmol/L (ref 101–111)
Creatinine, Ser: 0.66 mg/dL (ref 0.44–1.00)
GLUCOSE: 118 mg/dL — AB (ref 65–99)
POTASSIUM: 3.7 mmol/L (ref 3.5–5.1)
Sodium: 137 mmol/L (ref 135–145)

## 2016-02-29 LAB — PROTIME-INR
INR: 1.46
Prothrombin Time: 17.8 seconds — ABNORMAL HIGH (ref 11.4–15.0)

## 2016-02-29 LAB — SURGICAL PATHOLOGY

## 2016-02-29 MED ORDER — HYDROCODONE-ACETAMINOPHEN 5-325 MG PO TABS: 1.0000 | ORAL_TABLET | ORAL | Status: DC | PRN

## 2016-02-29 NOTE — Progress Notes (Signed)
Subjective: 2 Days Post-Op Procedure(s) (LRB): TOTAL HIP ARTHROPLASTY (Right) Patient reports pain as 4 on 0-10 scale.   Patient is well, and has had no acute complaints or problems Continue with physical  therapy today.  Plan is to go Rehab after hospital stay. no nausea and no vomiting Patient denies any chest pains or shortness of breath. Nurses states she went to the bathroom 17 times yest. Pt states she normally will go 4-5 times during the night. States she has not discussed this her doctor.  Objective: Vital signs in last 24 hours: Temp:  [98.7 F (37.1 C)-99.9 F (37.7 C)] 98.7 F (37.1 C) (03/10 0400) Pulse Rate:  [84-114] 84 (03/10 0400) Resp:  [18-19] 19 (03/10 0400) BP: (108-149)/(60-71) 149/66 mmHg (03/10 0400) SpO2:  [93 %-96 %] 93 % (03/10 0400) well approximated incision Heels are non tender and elevated off the bed using rolled towels Intake/Output from previous day: 03/09 0701 - 03/10 0700 In: 480 [P.O.:480] Out: 2235 [Urine:2055; Drains:180] Intake/Output this shift: Total I/O In: -  Out: 110 [Urine:110]   Recent Labs  02/28/16 0600 02/29/16 0454  HGB 9.8* 11.2*    Recent Labs  02/28/16 0600 02/29/16 0454  WBC 7.4 13.5*  RBC 2.94* 3.40*  HCT 28.9* 33.2*  PLT 194 221    Recent Labs  02/28/16 0600 02/29/16 0454  NA 134* 137  K 4.2 3.7  CL 106 105  CO2 25 25  BUN 13 7  CREATININE 0.66 0.66  GLUCOSE 97 118*  CALCIUM 7.8* 8.4*    Recent Labs  02/28/16 0600 02/29/16 0454  INR 1.20 1.46    EXAM General - Patient is Alert, Appropriate and Oriented Extremity - Neurologically intact Neurovascular intact Sensation intact distally Intact pulses distally Dorsiflexion/Plantar flexion intact Dressing - scant drainage Motor Function - intact, moving foot and toes well on exam.    Past Medical History  Diagnosis Date  . Essential hypertension, benign   . Arthritis   . Heart disease 2001  . Hypertension 1996  . Stroke [facility])  2009  . Unspecified essential hypertension   . Hearing disorder 2012    hearing aids  . Reflux     acid reflux  . Breast screening, unspecified   . Lump or mass in breast   . Special screening for malignant neoplasms, colon   . Other benign neoplasm of connective and other soft tissue of thorax   . Personal history of malignant neoplasm of breast   . Atrial fibrillation ([place])   . Allergy   . Atrial fibrillation ([place])   . Atrial fibrillation with rapid ventricular response [facility]) November 2015    Heart rate up to 140 requiring IV Cardizem  . Breast cancer [facility]) 2012    Patient underwent a left mastectomy on 03-31-11. Pathology showed DCIS at both sites without an invasive component. Sentinel nodes were negative. The breast lesions were noted to be ER +40%, PR-positive, 5%.   . Malignant neoplasm of upper-inner quadrant of female breast ([place])   . Malignant neoplasm of upper-outer quadrant of female breast ([place])   . Hip pain   . Coronary artery disease   . Heart attack ([place]) 2001  . Anxiety   . GERD (gastroesophageal reflux disease)   . Diverticulosis   . Hyperlipidemia     Assessment/Plan: 2 Days Post-Op Procedure(s) (LRB): TOTAL HIP ARTHROPLASTY (Right) Active Problems:   S/P total hip arthroplasty  Estimated body mass index is 25.8 kg/(m^2) as calculated from the following:  Height as of this encounter: 4\' 8"  (1.422 m).   Weight as of this encounter: 52.164 kg (115 lb). Up with therapy D/C IV fluids Plan for discharge tomorrow Discharge to SNF  Labs: reviewed DVT Prophylaxis - Lovenox, Foot Pumps and TED hose Weight-Bearing as tolerated to left leg Needs a bowel movement today Lactulose added UA today to be sure she does not have a UTI, but I think this is nl for her Please change dressing later today   Pearla Mckinny R. [place] 02/29/2016, 7:17 AM

## 2016-02-29 NOTE — Progress Notes (Signed)
Plan is for patient to D/C to [facility] tomorrow 03/01/16 pending medical clearance. Per Amy admissions coordinator at [facility] patient will go to room 210-A. RN will call report at [phone] and arrange EMS for transport. Clinical Education officer, museum (CSW) sent D/C Summary, FL2, D/C Packet and signed [facility] consent form to Amy today. Patient is aware of above. Patient's daughter Loletha Carrow is at bedside and aware of above. CSW will continue to follow and assist as needed.   Blima Rich, LCSW [phone]

## 2016-02-29 NOTE — Progress Notes (Signed)
Physical Therapy Treatment Patient Details Name: Peggy [name] MRN: [id number] DOB: 1927/12/27 Today's Date: 02/29/2016    History of Present Illness Pt. is a 82 y.o. female who underwent a RTHA on 3/08.     PT Comments    Pt shows good effort with PT, though she initially indicated that she couldn't do much.  She needs considerable assist with getting to sitting at EOB, but does well with transfers and limited ambulation.  Pt does not feel she could get out of the room walking secondary to fatigue, she is not able to recall hip precautions but overall does well and shows some improvement.   Follow Up Recommendations  SNF     Equipment Recommendations       Recommendations for Other Services       Precautions / Restrictions Precautions Precautions: Posterior Hip;Fall Precaution Comments: Pt. requires cuing for hip precautions during ADL tasks. Restrictions Weight Bearing Restrictions: Yes RLE Weight Bearing: Weight bearing as tolerated    Mobility  Bed Mobility Overal bed mobility: Needs Assistance Bed Mobility: Supine to Sit     Supine to sit: Mod assist;Max assist     General bed mobility comments: Pt struggles with any in-bed mobility and is unable to assist much with getting up to sitting  Transfers Overall transfer level: Needs assistance Equipment used: Rolling walker (2 wheeled) Transfers: Sit to/from Stand Sit to Stand: Min assist         General transfer comment: Pt does well with getting to standing, she shows poor control getting back to sittig despite cuing.  Ambulation/Gait Ambulation/Gait assistance: Min assist Ambulation Distance (Feet): 40 Feet Assistive device: Rolling walker (2 wheeled)       General Gait Details: Pt continues to have some hesitancy with R WBing, but does not have any overt safety issues.  She becomes fatigue with the effort and is not able to push herself any further, pt overall does well and is able to maintain slow but  consistent ambulation.     Stairs            Wheelchair Mobility    Modified Rankin (Stroke Patients Only)       Balance                                    Cognition Arousal/Alertness: Awake/alert Behavior During Therapy: WFL for tasks assessed/performed Overall Cognitive Status: Within Functional Limits for tasks assessed       Memory: Decreased recall of precautions              Exercises Total Joint Exercises Ankle Circles/Pumps: AROM;Both;20 reps;Supine Quad Sets: Strengthening;Both;Supine;20 reps Gluteal Sets: Strengthening;Both;Supine;20 reps Short Arc Quad: 20 reps;Supine;AAROM;Right Heel Slides: AAROM;Right;20 reps;Supine Hip ABduction/ADduction: AAROM;Right;20 reps;Supine    General Comments        Pertinent Vitals/Pain Pain Score: 3     Home Living                      Prior Function            PT Goals (current goals can now be found in the care plan section) Acute Rehab PT Goals Patient Stated Goal: To go to STR and SNF level of care Progress towards PT goals: Progressing toward goals    Frequency  BID    PT Plan Current plan remains appropriate    Co-evaluation  End of Session Equipment Utilized During Treatment: Gait belt Activity Tolerance: Patient limited by fatigue Patient left: with chair alarm set;with call bell/phone within reach     Time: 1002-1033 PT Time Calculation (min) (ACUTE ONLY): 31 min  Charges:  $Gait Training: 8-22 mins $Therapeutic Exercise: 8-22 mins                    G Codes:     Wayne Both, PT, DPT [phone]  Kreg Shropshire 02/29/2016, 12:37 PM

## 2016-02-29 NOTE — Consult Note (Signed)
ANTICOAGULATION CONSULT NOTE - follow up [facility] for warfarin Indication: atrial fibrillation  Allergies  Allergen Reactions  . Alendronate Sodium     REACTION: questionable  . Amoxicillin-Pot Clavulanate   . Etodolac   . Ibandronate Sodium     REACTION: questionable  . Iodine     Other reaction(s): Unknown  . Lactose Intolerance (Gi) Other (See Comments)    GI problems  . Minocycline Hcl   . Risedronate Sodium   . Sulfonamide Derivatives Other (See Comments)    Stomach pain  . Zoledronic Acid     REACTION: "dental problems"    Patient Measurements: Height: 4\' 8"  (142.2 cm) Weight: 115 lb (52.164 kg) IBW/kg (Calculated) : 36.3 Heparin Dosing Weight:   Vital Signs: Temp: 98.9 F (37.2 C) (03/10 0744) Temp Source: Oral (03/10 0744) BP: 149/72 mmHg (03/10 0744) Pulse Rate: 91 (03/10 0744)  Labs:  Recent Labs  02/27/16 0619 02/28/16 0600 02/29/16 0454  HGB  --  9.8* 11.2*  HCT  --  28.9* 33.2*  PLT  --  194 221  LABPROT 13.7 15.4* 17.8*  INR 1.03 1.20 1.46  CREATININE  --  0.66 0.66    Estimated Creatinine Clearance: 34.7 mL/min (by C-G formula based on Cr of 0.66).   Medical History: Past Medical History  Diagnosis Date  . Essential hypertension, benign   . Arthritis   . Heart disease 2001  . Hypertension 1996  . Stroke [facility]) 2009  . Unspecified essential hypertension   . Hearing disorder 2012    hearing aids  . Reflux     acid reflux  . Breast screening, unspecified   . Lump or mass in breast   . Special screening for malignant neoplasms, colon   . Other benign neoplasm of connective and other soft tissue of thorax   . Personal history of malignant neoplasm of breast   . Atrial fibrillation ([place])   . Allergy   . Atrial fibrillation ([place])   . Atrial fibrillation with rapid ventricular response [facility]) November 2015    Heart rate up to 140 requiring IV Cardizem  . Breast cancer [facility]) 2012    Patient underwent a left  mastectomy on 03-31-11. Pathology showed DCIS at both sites without an invasive component. Sentinel nodes were negative. The breast lesions were noted to be ER +40%, PR-positive, 5%.   . Malignant neoplasm of upper-inner quadrant of female breast ([place])   . Malignant neoplasm of upper-outer quadrant of female breast ([place])   . Hip pain   . Coronary artery disease   . Heart attack ([place]) 2001  . Anxiety   . GERD (gastroesophageal reflux disease)   . Diverticulosis   . Hyperlipidemia     Medications:  Scheduled:  . acidophilus   Oral Daily  . calcium-vitamin D  1 tablet Oral BID  . diltiazem  240 mg Oral Daily  . escitalopram  10 mg Oral Daily  . feeding supplement (ENSURE ENLIVE)  237 mL Oral BID BM  . ferrous sulfate  325 mg Oral BID WC  . loratadine  10 mg Oral Daily  . metoprolol  50 mg Oral BID  . multivitamin with minerals   Oral Daily  . multivitamin-lutein  1 capsule Oral BID  . senna-docusate  1 tablet Oral BID  . warfarin  3.5 mg Oral q1800  . Warfarin - Pharmacist Dosing Inpatient   Does not apply q1800    Assessment: Pt is a 82 year old female s/p  hip replacement. Pt has a PMH of afib on warfarin 3.5mg  daily. Pharmacy consulted to restart warfarin after surgery. Per Dr. Marry Guan, no bridging. INR today is 1.03.  3/9 INR 1.20  Goal of Therapy:  INR 2-3 Monitor platelets by anticoagulation protocol: Yes   Plan:  Restart patient home dose 3.5mg  daily. Recheck INR in the AM. Continue pt home dose  Chinita Greenland PharmD Clinical Pharmacist 02/29/2016    02/29/2016,3:31 PM

## 2016-02-29 NOTE — Care Management Important Message (Signed)
Important Message  Patient Details  Name: Peggy [name] MRN: [id number] Date of Birth: [birth year]   Medicare Important Message Given:  Yes    Juliann Pulse A Shannan Slinker 02/29/2016, 11:18 AM

## 2016-02-29 NOTE — Progress Notes (Signed)
Physical Therapy Treatment Patient Details Name: Peggy [name] MRN: [id number] DOB: 05-05-1928 Today's Date: 02/29/2016    History of Present Illness Pt. is a 82 y.o. female who underwent a RTHA on 3/08.     PT Comments    Pt does well despite being tired.  She was able to increase ambulation distance, needed less assist getting up to EOB and showed increased confidence and quality of motion with R LE exercises.  Pt does not report excessive pain but does have some hesitation with WBing and reports general stiffness.  Pt showing improvement, but still needs assist with mobility and fatigues with increased ambulation.   Follow Up Recommendations  SNF     Equipment Recommendations       Recommendations for Other Services       Precautions / Restrictions Precautions Precautions: Posterior Hip;Fall Precaution Comments: Pt. requires cuing for hip precautions during ADL tasks. Restrictions Weight Bearing Restrictions: Yes RLE Weight Bearing: Weight bearing as tolerated    Mobility  Bed Mobility Overal bed mobility: Needs Assistance Bed Mobility: Supine to Sit;Sit to Supine     Supine to sit: Min assist Sit to supine: Mod assist;Max assist   General bed mobility comments: Pt shows increased effort with getting to EOB but still needs assist.  Pt unable to lift LE into bed needing more assist getting back to supine.  Transfers Overall transfer level: Needs assistance Equipment used: Rolling walker (2 wheeled) Transfers: Sit to/from Stand Sit to Stand: Min guard         General transfer comment: Pt able to rise to standing with only minimal UE use, she is able to maintain balance w/o too much issue.  Ambulation/Gait Ambulation/Gait assistance: Min guard Ambulation Distance (Feet): 80 Feet Assistive device: Rolling walker (2 wheeled)       General Gait Details: Pt displays increased confidence with ambulation this afternoon and though she is very reliant on the  walker (and reports significant UE fatigue) she ultimately shows good effort and safety.   Stairs            Wheelchair Mobility    Modified Rankin (Stroke Patients Only)       Balance                                    Cognition Arousal/Alertness: Awake/alert Behavior During Therapy: WFL for tasks assessed/performed Overall Cognitive Status: Within Functional Limits for tasks assessed       Memory: Decreased recall of precautions              Exercises Total Joint Exercises Ankle Circles/Pumps: AROM;Both;20 reps;Supine Quad Sets: Strengthening;Both;Supine;20 reps Gluteal Sets: Strengthening;Both;Supine;20 reps Towel Squeeze: Strengthening;Both;Supine;20 reps Short Arc Quad: 20 reps;Supine;AAROM;Right Heel Slides: AAROM;Right;20 reps;Supine Hip ABduction/ADduction: AAROM;Right;20 reps;Supine    General Comments        Pertinent Vitals/Pain Pain Score: 3     Home Living                      Prior Function            PT Goals (current goals can now be found in the care plan section) Acute Rehab PT Goals Patient Stated Goal: To go to STR and SNF level of care Progress towards PT goals: Progressing toward goals    Frequency  BID    PT Plan Current plan remains appropriate    Co-evaluation  End of Session Equipment Utilized During Treatment: Gait belt Activity Tolerance: Patient limited by fatigue Patient left: with bed alarm set;with call bell/phone within reach     Time: [id number] PT Time Calculation (min) (ACUTE ONLY): 24 min  Charges:  $Gait Training: 8-22 mins $Therapeutic Exercise: 8-22 mins                    G Codes:     Wayne Both, PT, DPT [phone]  Kreg Shropshire 02/29/2016, 3:16 PM

## 2016-02-29 NOTE — Discharge Summary (Addendum)
Physician Discharge Summary  Patient ID: Peggy [name] MRN: [id number] DOB/AGE: 1928-07-15 82 y.o.  Admit date: 02/27/2016 Discharge date: 03/02/2016  Admission Diagnoses:  primary osteoarthritis   Discharge Diagnoses: Patient Active Problem List   Diagnosis Date Noted  . S/P total hip arthroplasty 02/27/2016  . Hip pain, acute 01/15/2016  . Hip pain 01/15/2016  . Memory loss 03/21/2015  . Abnormality of gait 03/21/2015  . Chest pain 03/11/2015  . Atrial fibrillation with RVR ([place]) 03/11/2015  . H/O: CVA (cerebrovascular accident) 03/11/2015  . Right temporal lobe infarction ([place]) 04/10/2014  . Blurred vision 09/05/2013  . 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 EPIGASTRIC PAIN 02/14/2009  . NONSPECIFIC ABN FINDNG RAD&OTH EXAM BILARY TRCT 02/14/2009    Past Medical History  Diagnosis Date  . Essential hypertension, benign   . Arthritis   . Heart disease 2001  . Hypertension 1996  . Stroke [facility]) 2009  . Unspecified essential hypertension   . Hearing disorder 2012    hearing aids  . Reflux     acid reflux  . Breast screening, unspecified   . Lump or mass in breast   . Special screening for malignant neoplasms, colon   . Other benign neoplasm of connective and other soft tissue of thorax   . Personal history of malignant neoplasm of breast   . Atrial fibrillation ([place])   . Allergy   . Atrial fibrillation ([place])   . Atrial fibrillation with rapid ventricular response [facility]) November 2015    Heart rate up to 140 requiring IV Cardizem  . Breast cancer [place]) 2012    Patient underwent a left mastectomy on 03-31-11. Pathology showed DCIS at both sites without an invasive component. Sentinel nodes were negative. The breast lesions were noted to be ER +40%, PR-positive, 5%.   . Malignant neoplasm of upper-inner quadrant of female breast ([place])   . Malignant neoplasm of upper-outer quadrant of female breast ([place])    . Hip pain   . Coronary artery disease   . Heart attack ([place]) 2001  . Anxiety   . GERD (gastroesophageal reflux disease)   . Diverticulosis   . Hyperlipidemia      Transfusion: No transfusions given doing this admission   Consultants (if any):   case management for assistance with placement  Discharged Condition: Improved  Hospital Course: Peggy [name] is an 82 y.o. female who was admitted 02/27/2016 with a diagnosis of degenerative arthrosis right hip and went to the operating room on 02/27/2016 and underwent the above named procedures.    Surgeries:Procedure(s): TOTAL HIP ARTHROPLASTY on 02/27/2016  PRE-OPERATIVE DIAGNOSIS: Degenerative arthrosis of the right hip, primary  POST-OPERATIVE DIAGNOSIS: Same  PROCEDURE: Right total hip arthroplasty  SURGEON: Marciano Sequin. M.D.  ASSISTANT: Vance Peper, PA (present and scrubbed throughout the case, critical for assistance with exposure, retraction, instrumentation, and closure)  ANESTHESIA: spinal  ESTIMATED BLOOD LOSS: 250 mL  FLUIDS REPLACED: 1600 mL of crystalloid  DRAINS: 2 medium drains to a Hemovac reservoir  IMPLANTS UTILIZED: DePuy 15 mm small stature AML femoral stem, 48 mm OD Pinnacle 100 acetabular component, neutral Pinnacle Altrx polyethylene insert, and a 32 mm CoCr +1 mm hip ball  INDICATIONS FOR SURGERY: Peggy [name] is a 82 y.o. year old female with a long history of progressive hip and groin pain. X-rays demonstrated severe degenerative changes. The patient had not seen any significant improvement despite conservative nonsurgical intervention.  After discussion of the risks and benefits of surgical intervention, the patient expressed understanding of the risks benefits and agree with plans for total hip arthroplasty.   The risks, benefits, and alternatives were discussed at length including but not limited to the risks of infection, bleeding, nerve injury, stiffness, blood clots, the need for revision  surgery, limb length inequality, dislocation, cardiopulmonary complications, among others, and they were willing to proceed. Patient tolerated the surgery well. No complications .Patient was taken to PACU where she was stabilized and then transferred to the orthopedic floor.  Patient started on Lovenox 30 q 12 hrs. Foot pumps applied bilaterally at 80 mm hg. Heels elevated off bed with rolled towels. No evidence of DVT. Calves non tender. Negative Homan. Physical therapy started on day #1 for gait training and transfer with OT starting on  day #1 for ADL and assisted devices. Patient has done well with therapy. Ambulated 26 feet upon being discharged.  Patient's IV and Foley were discontinued on day #1 with Hemovac being discontinued on day #2. Dressing change chains on day #2.   She was given perioperative antibiotics:  Anti-infectives    Start     Dose/Rate Route Frequency Ordered Stop   02/27/16 1400  clindamycin (CLEOCIN) IVPB 600 mg     600 mg 100 mL/hr over 30 Minutes Intravenous Every 6 hours 02/27/16 1147 02/28/16 0930   02/27/16 0615  clindamycin (CLEOCIN) IVPB 900 mg     900 mg 100 mL/hr over 30 Minutes Intravenous  Once 02/27/16 0601 02/27/16 0800   02/27/16 0611  clindamycin (CLEOCIN) 900 MG/50ML IVPB    Comments:  Rexanne Mano: cabinet override      02/27/16 0611 02/27/16 1814    .  She was fitted with AV 1 compression foot devices bilaterally, instructed on heel pumps, early ambulation, and fitted with TED stockings bilaterally for DVT prophylaxis.,,   She benefited maximally from the hospital stay and there were no complications.    Recent vital signs:  Filed Vitals:   02/28/16 1935 02/29/16 0400  BP: 146/71 149/66  Pulse: 114 84  Temp: 99.4 F (37.4 C) 98.7 F (37.1 C)  Resp: 19 19    Recent laboratory studies:  Lab Results  Component Value Date   HGB 11.2* 02/29/2016   HGB 9.8* 02/28/2016   HGB 13.4 02/13/2016   Lab Results  Component Value Date    WBC 13.5* 02/29/2016   PLT 221 02/29/2016   Lab Results  Component Value Date   INR 1.46 02/29/2016   Lab Results  Component Value Date   NA 137 02/29/2016   K 3.7 02/29/2016   CL 105 02/29/2016   CO2 25 02/29/2016   BUN 7 02/29/2016   CREATININE 0.66 02/29/2016   GLUCOSE 118* 02/29/2016    Discharge Medications:     Medication List    TAKE these medications        acetaminophen 325 MG tablet  Commonly known as:  TYLENOL  Take 325 mg by mouth every 6 (six) hours as needed for mild pain.     ALPRAZolam 0.25 MG tablet  Commonly known as:  XANAX  Take 0.25 mg by mouth at bedtime as needed for anxiety. For sleep     aspirin 81 MG tablet  Take 81 mg by mouth daily.     BENGAY VANISHING SCENT 2.5 % Gel  Generic drug:  Menthol (Topical Analgesic)  Apply topically as needed.     CENTRUM SILVER PO  Take  1 tablet by mouth daily.     PRESERVISION AREDS 2 Caps  Take 1 tablet by mouth 2 (two) times daily.     diltiazem 240 MG 24 hr capsule  Commonly known as:  CARDIZEM CD  Take 240 mg by mouth daily.     docusate sodium 100 MG capsule  Commonly known as:  COLACE  Take 100 mg by mouth daily as needed for mild constipation.     escitalopram 10 MG tablet  Commonly known as:  LEXAPRO  Take 10 mg by mouth daily.     fexofenadine 180 MG tablet  Commonly known as:  ALLEGRA  Take 180 mg by mouth as needed for allergies or rhinitis.     lansoprazole 15 MG capsule  Commonly known as:  PREVACID  Take 30 mg by mouth daily.     metoprolol 50 MG tablet  Commonly known as:  LOPRESSOR  Take 50 mg by mouth 2 (two) times daily.     nitroGLYCERIN 0.4 MG SL tablet  Commonly known as:  NITROSTAT  Place 0.4 mg under the tongue every 5 (five) minutes as needed for chest pain. Every 5 minutes as needed     oxyCODONE 5 MG immediate release tablet  Commonly known as:  Oxy IR/ROXICODONE  Take 1-2 tablets (5-10 mg total) by mouth every 4 (four) hours as needed for severe pain.      PROBIOTIC DAILY PO  Take 1 capsule by mouth daily.     RA CALCIUM PLUS VITAMIN D 600-400 MG-UNIT tablet  Generic drug:  Calcium Carbonate-Vitamin D  Take 1 tablet by mouth 2 (two) times daily.     tiZANidine 2 MG tablet  Commonly known as:  ZANAFLEX  Take 2 mg by mouth 3 (three) times daily as needed for muscle spasms.     traMADol 50 MG tablet  Commonly known as:  ULTRAM  Take 1-2 tablets by mouth every 6 hours as needed for moderate to severe pain     traMADol 50 MG tablet  Commonly known as:  ULTRAM  Take 1-2 tablets (50-100 mg total) by mouth every 4 (four) hours as needed for moderate pain.     warfarin 3 MG tablet  Commonly known as:  COUMADIN  Take 3 mg by mouth daily at 6 PM. One-half of a one mg tablet (Total 3.5 mg) every evening       NOTE: Clarification of warfarin order above. Warfarin 3.5 mg by mouth every 6 p.m.  Diagnostic Studies: Dg Hip Port Unilat With Pelvis 1v Right  02/27/2016  CLINICAL DATA:  Status post right knee arthroplasty. EXAM: DG HIP (WITH OR WITHOUT PELVIS) 1V PORT RIGHT COMPARISON:  None. FINDINGS: The femoral and acetabular components of the right knee arthroplasty appear well seated and aligned. There is no acute fracture or evidence of an operative complication. IMPRESSION: Well-aligned right hip arthroplasty. Electronically Signed   By: Lajean Manes M.D.   On: 02/27/2016 11:49    Disposition: 01-Home or Self Care      Discharge Instructions    Diet - low sodium heart healthy    Complete by:  As directed      Diet - low sodium heart healthy    Complete by:  As directed      Increase activity slowly    Complete by:  As directed      Increase activity slowly    Complete by:  As directed  Follow-up Information    Follow up with Dereck Leep, MD On 04/10/2016.   Specialty:  Orthopedic Surgery   Why:  at 1:45pm   Contact information:   [address] [facility] [place] [address] [phone]        Follow up with [facility] .   Specialty:  [place] information:   [address] [place] [phone]       Signed: Watt Climes. 02/29/2016, 7:28 AM

## 2016-02-29 NOTE — Progress Notes (Signed)
Occupational Therapy Treatment Patient Details Name: SAVONNA PERRA MRN: [id number] DOB: [birth year] Today's Date: 02/29/2016    History of present illness Pt. is a 82 y.o. female who underwent a RTHA on 3/08.    OT comments  Pt. Is an 82 y.o. Female who was admitted to [facility] for a RTHA. Pt. Currently has posterior hip precautions, and 4/10 pain which hinder ability to perform self care tasks. Pt. Benefits from A/E training for LE dressing, and requires consistent cues for adhering to hip precautions throughout treatment session. Pt. has plans to go to [facility] tomorrow for SNF STR.    Follow Up Recommendations  SNF    Equipment Recommendations  3 in 1 bedside comode;Tub/shower bench    Recommendations for Other Services Rehab consult    Precautions / Restrictions Precautions Precautions: Posterior Hip;Fall Precaution Comments: Pt. requires cuing for hip precautions during ADL tasks. Restrictions Weight Bearing Restrictions: Yes RLE Weight Bearing: Weight bearing as tolerated       Mobility Bed Mobility                  Transfers                      Balance                                   ADL                     Upper Body Dressing Details (indicate cue type and reason): Pt. perfromed LE dressing with Adaptive Equipment use. Pt. required  visual demonstration. Pt. required consistent  verbal, and vsual cues for hip precautions and for proper adaptive equipment use.  Lower Body Dressing: Moderate assistance                        Vision                     Perception     Praxis      Cognition   Behavior During Therapy: WFL for tasks assessed/performed Overall Cognitive Status: Within Functional Limits for tasks assessed       Memory: Decreased recall of precautions               Extremity/Trunk Assessment               Exercises     Shoulder Instructions       General Comments       Pertinent Vitals/ Pain       Pain Score: 4   Home Living                                          Prior Functioning/Environment              Frequency Min 2X/week     Progress Toward Goals  OT Goals(current goals can now be found in the care plan section)     Acute Rehab OT Goals Patient Stated Goal: To go to STR and SNF level of care Potential to Achieve Goals: Good  Plan Discharge plan remains appropriate    Co-evaluation                 End of Session Equipment  Utilized During Treatment: Rolling walker;Other (comment)   Activity Tolerance Patient tolerated treatment well   Patient Left in chair;with call bell/phone within reach;with chair alarm set   Nurse Communication          Time: [id number] OT Time Calculation (min): 30 min  Charges:  Harrel Carina, MS, OTR/L     Harrel Carina 02/29/2016, 11:53 AM

## 2016-03-01 LAB — URINALYSIS COMPLETE WITH MICROSCOPIC (ARMC ONLY)
BILIRUBIN URINE: NEGATIVE
Glucose, UA: NEGATIVE mg/dL
Hgb urine dipstick: NEGATIVE
LEUKOCYTES UA: NEGATIVE
Nitrite: NEGATIVE
PH: 8 (ref 5.0–8.0)
PROTEIN: NEGATIVE mg/dL
Specific Gravity, Urine: 1.009 (ref 1.005–1.030)

## 2016-03-01 LAB — CBC
HCT: 32.6 % — ABNORMAL LOW (ref 35.0–47.0)
HEMOGLOBIN: 11.1 g/dL — AB (ref 12.0–16.0)
MCH: 32.7 pg (ref 26.0–34.0)
MCHC: 34 g/dL (ref 32.0–36.0)
MCV: 96.2 fL (ref 80.0–100.0)
Platelets: 246 10*3/uL (ref 150–440)
RBC: 3.39 MIL/uL — ABNORMAL LOW (ref 3.80–5.20)
RDW: 13.2 % (ref 11.5–14.5)
WBC: 11.6 10*3/uL — ABNORMAL HIGH (ref 3.6–11.0)

## 2016-03-01 LAB — PROTIME-INR
INR: 1.45
PROTHROMBIN TIME: 17.7 s — AB (ref 11.4–15.0)

## 2016-03-01 MED ORDER — WARFARIN SODIUM 3 MG PO TABS
5.0000 mg | ORAL_TABLET | Freq: Once | ORAL | Status: AC
  Administered 2016-03-01: 5 mg via ORAL
  Filled 2016-03-01: qty 1

## 2016-03-01 MED ORDER — SODIUM CHLORIDE 0.9 % IV SOLN
INTRAVENOUS | Status: DC
  Administered 2016-03-01: 14:00:00 via INTRAVENOUS

## 2016-03-01 NOTE — Progress Notes (Signed)
Occupational Therapy Treatment Patient Details Name: Peggy [name] MRN: [id number] DOB: 01/09/1928 Today's Date: 03/01/2016    History of present illness Pt. is a 82 y.o. female who underwent a RTHA on 3/08.    OT comments  Patient was sitting up in recliner when OT arrived. Nursing was finishing with patient, and stated that patient had episode of orthostatic hypotension when getting up this morning. BP was just taken and normal at this time. Patient was pleasant and agreeable to work with OT. Reviewed posterior hip precautions, with patient only able to recall 1 of 3. Reviewed all 3 with patient, who stated remembering each when reviewed. Patient educated in use of adaptive equipment for lower body dressing to assist with maintaining hip precautions. Patient required visual and verbal demo, and then was able to perform with Mod A and cues for use of AE and maintaining hip precautions. Most difficulty was with internal rotation when trying to reach her foot with AE. Reviewed safety with all ADL tasks and how to maintain hip precautions during basic ADL tasks. At end of session patient able to recall 3 of 3 posterior hip precautions with extra time.   Follow Up Recommendations  SNF    Equipment Recommendations  3 in 1 bedside comode;Tub/shower bench    Recommendations for Other Services      Precautions / Restrictions Precautions Precautions: Posterior Hip;Fall Precaution Comments: Pt. requires cuing for hip precautions during ADL tasks. Restrictions Weight Bearing Restrictions: Yes RLE Weight Bearing: Weight bearing as tolerated       Mobility Bed Mobility                  Transfers                      Balance                                   ADL Overall ADL's : Needs assistance/impaired                     Lower Body Dressing: Moderate assistance Lower Body Dressing Details (indicate cue type and reason): Pt performed LE dressing  with Adaptive Equipment use. Pt required visual demonstration. Patient required consistent verbal, and visual cues for hip precautions and proper adaptive equipment use.               General ADL Comments: Pt requires mod assist and mod cues for reacher and sock aid. Mod cues for remembering hip precautions.      Vision                     Perception     Praxis      Cognition   Behavior During Therapy: WFL for tasks assessed/performed Overall Cognitive Status: Within Functional Limits for tasks assessed       Memory: Decreased recall of precautions               Extremity/Trunk Assessment               Exercises     Shoulder Instructions       General Comments      Pertinent Vitals/ Pain       Pain Assessment: 0-10 Pain Score: 2  Pain Location: right hip. Patient states pain is much better, and barely hurts when sitting with little movement. Pain Intervention(s):  Repositioned;Monitored during session  Home Living                                          Prior Functioning/Environment              Frequency Min 2X/week     Progress Toward Goals  OT Goals(current goals can now be found in the care plan section)  Progress towards OT goals: Progressing toward goals  Acute Rehab OT Goals Patient Stated Goal: To go to STR and SNF level of care OT Goal Formulation: With patient/family Time For Goal Achievement: 03/13/16 Potential to Achieve Goals: Good  Plan Discharge plan remains appropriate    Co-evaluation                 End of Session     Activity Tolerance Patient tolerated treatment well   Patient Left in chair;with call bell/phone within reach;with chair alarm set   Nurse Communication          Time: [phone] OT Time Calculation (min): 25 min  Charges: OT General Charges $OT Visit: 1 Procedure OT Treatments $Self Care/Home Management : 23-37 mins  Yoshi Mancillas L 03/01/2016, 11:43  AM  Amie [place], OTR/L

## 2016-03-01 NOTE — Progress Notes (Signed)
Per Dr. Marry Guan patient is not medically stable for D/C today and may be ready tomorrow 03/02/16. Clinical Education officer, museum (CSW) contacted Copy at [employer] today and made her aware of above. CSW will continue to follow and assist as needed.   Blima Rich, LCSW [phone]

## 2016-03-01 NOTE — Progress Notes (Signed)
Physical Therapy Treatment Patient Details Name: Peggy [name] MRN: [id number] DOB: 1928/03/26 Today's Date: 03/01/2016    History of Present Illness Pt. is a 82 y.o. female who underwent a RTHA on 3/08.     PT Comments    Pt had been sitting up some this AM and just go back to bed with nursing.  She reports being too tired to try to do any mobility this session, but does agree to some bed exercises.  She is less pain responsive to exercises today and tolerates increased ROM and some increased resistance as well with R LE acts.  Pt agrees to do some walking this afternoon.   Follow Up Recommendations  SNF     Equipment Recommendations       Recommendations for Other Services       Precautions / Restrictions Precautions Precautions: Posterior Hip;Fall Precaution Comments: Pt. requires cuing for hip precautions during ADL tasks. Restrictions Weight Bearing Restrictions: Yes RLE Weight Bearing: Weight bearing as tolerated    Mobility  Bed Mobility               General bed mobility comments: Pt just had gotten back to bed with nursing and reports being tired.  Refuses any mobility this session.  Transfers                    Ambulation/Gait                 Stairs            Wheelchair Mobility    Modified Rankin (Stroke Patients Only)       Balance                                    Cognition Arousal/Alertness: Awake/alert Behavior During Therapy: WFL for tasks assessed/performed Overall Cognitive Status: Within Functional Limits for tasks assessed       Memory: Decreased recall of precautions              Exercises Total Joint Exercises Ankle Circles/Pumps: AROM;Both;20 reps;Supine Quad Sets: Strengthening;Both;Supine;20 reps Gluteal Sets: Strengthening;Both;Supine;20 reps Heel Slides: AAROM;Right;20 reps;Supine Hip ABduction/ADduction: AAROM;Right;20 reps;Supine Straight Leg Raises: Right;AAROM;10  reps    General Comments        Pertinent Vitals/Pain Pain Assessment: 0-10 Pain Score: 3  Pain Location: right hip. Patient states pain is much better, and barely hurts when sitting with little movement. Pain Intervention(s): Repositioned;Monitored during session    Home Living                      Prior Function            PT Goals (current goals can now be found in the care plan section) Acute Rehab PT Goals Patient Stated Goal: To go to STR and SNF level of care    Frequency  BID    PT Plan Current plan remains appropriate    Co-evaluation             End of Session   Activity Tolerance: Patient tolerated treatment well Patient left: with bed alarm set;with call bell/phone within reach     Time: [id number] PT Time Calculation (min) (ACUTE ONLY): 17 min  Charges:  $Therapeutic Exercise: 8-22 mins                    G Codes:  Wayne Both, PT, DPT [phone]  Kreg Shropshire 03/01/2016, 12:09 PM

## 2016-03-01 NOTE — Progress Notes (Addendum)
Subjective: 3 Days Post-Op Procedure(s) (LRB): TOTAL HIP ARTHROPLASTY (Right) Patient reports pain as 4 on 0-10 scale.   Patient is well, and has had no acute complaints or problems Continue with physical  therapy today.  Plan is to go Rehab after hospital stay. no nausea and no vomiting Patient denies any chest pains or shortness of breath. Patient reports increased urine frequency yesterday.  Denies painful urination or odor.  Objective: Vital signs in last 24 hours: Temp:  [98.1 F (36.7 C)-100.9 F (38.3 C)] 100.9 F (38.3 C) (03/11 0401) Pulse Rate:  [76-95] 95 (03/11 0401) Resp:  [16-18] 16 (03/11 0401) BP: (107-149)/(55-72) 134/68 mmHg (03/11 0401) SpO2:  [93 %-94 %] 94 % (03/11 0401) well approximated incision Heels are non tender and elevated off the bed using rolled towels Intake/Output from previous day: 03/10 0701 - 03/11 0700 In: 240 [P.O.:240] Out: 110 [Urine:110] Intake/Output this shift:     Recent Labs  02/28/16 0600 02/29/16 0454  HGB 9.8* 11.2*    Recent Labs  02/28/16 0600 02/29/16 0454  WBC 7.4 13.5*  RBC 2.94* 3.40*  HCT 28.9* 33.2*  PLT 194 221    Recent Labs  02/28/16 0600 02/29/16 0454  NA 134* 137  K 4.2 3.7  CL 106 105  CO2 25 25  BUN 13 7  CREATININE 0.66 0.66  GLUCOSE 97 118*  CALCIUM 7.8* 8.4*    Recent Labs  02/29/16 0454 03/01/16 0323  INR 1.46 1.45    EXAM General - Patient is Alert, Appropriate and Oriented Extremity - Neurologically intact Neurovascular intact Sensation intact distally Intact pulses distally Dorsiflexion/Plantar flexion intact Dressing - scant drainage Motor Function - intact, moving foot and toes well on exam.    Past Medical History  Diagnosis Date  . Essential hypertension, benign   . Arthritis   . Heart disease 2001  . Hypertension 1996  . Stroke [facility]) 2009  . Unspecified essential hypertension   . Hearing disorder 2012    hearing aids  . Reflux     acid reflux  . Breast  screening, unspecified   . Lump or mass in breast   . Special screening for malignant neoplasms, colon   . Other benign neoplasm of connective and other soft tissue of thorax   . Personal history of malignant neoplasm of breast   . Atrial fibrillation ([place])   . Allergy   . Atrial fibrillation ([place])   . Atrial fibrillation with rapid ventricular response [facility]) November 2015    Heart rate up to 140 requiring IV Cardizem  . Breast cancer [facility]) 2012    Patient underwent a left mastectomy on 03-31-11. Pathology showed DCIS at both sites without an invasive component. Sentinel nodes were negative. The breast lesions were noted to be ER +40%, PR-positive, 5%.   . Malignant neoplasm of upper-inner quadrant of female breast ([place])   . Malignant neoplasm of upper-outer quadrant of female breast ([place])   . Hip pain   . Coronary artery disease   . Heart attack ([place]) 2001  . Anxiety   . GERD (gastroesophageal reflux disease)   . Diverticulosis   . Hyperlipidemia     Assessment/Plan: 3 Days Post-Op Procedure(s) (LRB): TOTAL HIP ARTHROPLASTY (Right) Active Problems:   S/P total hip arthroplasty  Estimated body mass index is 25.8 kg/(m^2) as calculated from the following:   Height as of this encounter: 4\' 8"  (1.422 m).   Weight as of this encounter: 52.164 kg (115 lb). Up with therapy  Discharge to SNF  Available labs reviewed. DVT Prophylaxis - Coumadin, Foot Pumps and TED hose Weight-Bearing as tolerated to left leg Patient had a bowel movement yesterday. UA was not done yesterday, will obtain UA today prior to discharge.  PT temp spiked to 100.4 this AM, recheck of temp was 99.4.  HR 116, CBC ordered for this morning as well. Dressing change today prior to discharge if medically stable.  Raquel James, PA-C [place] 03/01/2016, 7:31 AM  Addendum: UA and cbc reviewed and relatively unremarkable. Patient complains of lightheadedness. Poor PO intake. Probable  dehydration. Will gently rehydrate with IVF and encourage PO intake. Postpone discharge today and reassess tomorrow.  James P. Holley Bouche M.D.

## 2016-03-01 NOTE — Progress Notes (Signed)
Physical Therapy Treatment Patient Details Name: Peggy [name] MRN: [id number] DOB: [birth year] Today's Date: 03/01/2016    History of Present Illness Pt. is a 81 y.o. female who underwent a RTHA on 3/08.     PT Comments    Pt shows very good effort t/o PT session this afternoon and though her gait remains slow and somewhat hesitant she was able to walk ~150 ft (with chair following) before becoming too fatigued and needing to rest.  She shows good effort and overall does well with mobility, exercises and gait today.  Pt progressing nicely.  Follow Up Recommendations  SNF     Equipment Recommendations       Recommendations for Other Services       Precautions / Restrictions Precautions Precautions: Posterior Hip;Fall Precaution Comments: Pt continues to require cues to recall 3/3 precautions Restrictions RLE Weight Bearing: Weight bearing as tolerated    Mobility  Bed Mobility Overal bed mobility: Needs Assistance Bed Mobility: Supine to Sit     Supine to sit: Min assist     General bed mobility comments: Pt shows very good effort getting to EOB, needs assist to lift trunk and get R LE off EOB.  Transfers Overall transfer level: Needs assistance Equipment used: Rolling walker (2 wheeled) Transfers: Sit to/from Stand Sit to Stand: Min guard         General transfer comment: Pt able to rise w/o direct assist, though she does need to sit after initially standing up secondary to some lightheadedness,.    Ambulation/Gait Ambulation/Gait assistance: Min assist;Min guard Ambulation Distance (Feet): 150 Feet Assistive device: Rolling walker (2 wheeled)       General Gait Details: Pt has her best bout of ambulation yet.  She is able to show a more consistent cadence with deacreased reliance on the walker and overall shows improved speed and cadence.    Stairs            Wheelchair Mobility    Modified Rankin (Stroke Patients Only)       Balance                                     Cognition Arousal/Alertness: Awake/alert Behavior During Therapy: WFL for tasks assessed/performed Overall Cognitive Status: Within Functional Limits for tasks assessed                      Exercises Total Joint Exercises Ankle Circles/Pumps: AROM;Both;20 reps;Supine Quad Sets: Right;20 reps;Strengthening Gluteal Sets: 20 reps;Strengthening;Right Short Arc Quad: 15 reps;Strengthening;Right Heel Slides: 15 reps;Strengthening;Both Hip ABduction/ADduction: 20 reps;AROM;Both Straight Leg Raises: Right;AAROM;10 reps    General Comments        Pertinent Vitals/Pain Pain Score: 2  Pain Location: R hip/groin    Home Living                      Prior Function            PT Goals (current goals can now be found in the care plan section) Progress towards PT goals: Progressing toward goals    Frequency  BID    PT Plan Current plan remains appropriate    Co-evaluation             End of Session Equipment Utilized During Treatment: Gait belt Activity Tolerance: Patient tolerated treatment well Patient left: with family/visitor present;in chair;with call bell/phone within reach  Time: [id number] PT Time Calculation (min) (ACUTE ONLY): 40 min  Charges:  $Gait Training: 8-22 mins $Therapeutic Exercise: 8-22 mins $Therapeutic Activity: 8-22 mins                    G Codes:     Wayne Both, PT, DPT [phone]  Kreg Shropshire 03/01/2016, 4:36 PM

## 2016-03-01 NOTE — Consult Note (Signed)
ANTICOAGULATION CONSULT NOTE - follow up [place] for warfarin Indication: atrial fibrillation  Allergies  Allergen Reactions  . Alendronate Sodium     REACTION: questionable  . Amoxicillin-Pot Clavulanate   . Etodolac   . Ibandronate Sodium     REACTION: questionable  . Iodine     Other reaction(s): Unknown  . Lactose Intolerance (Gi) Other (See Comments)    GI problems  . Minocycline Hcl   . Risedronate Sodium   . Sulfonamide Derivatives Other (See Comments)    Stomach pain  . Zoledronic Acid     REACTION: "dental problems"    Patient Measurements: Height: 4\' 8"  (142.2 cm) Weight: 115 lb (52.164 kg) IBW/kg (Calculated) : 36.3 Heparin Dosing Weight:   Vital Signs: Temp: 99.4 F (37.4 C) (03/11 0732) Temp Source: Oral (03/11 0732) BP: 121/65 mmHg (03/11 0840) Pulse Rate: 116 (03/11 0732)  Labs:  Recent Labs  02/28/16 0600 02/29/16 0454 03/01/16 0323 03/01/16 0810  HGB 9.8* 11.2*  --  11.1*  HCT 28.9* 33.2*  --  32.6*  PLT 194 221  --  246  LABPROT 15.4* 17.8* 17.7*  --   INR 1.20 1.46 1.45  --   CREATININE 0.66 0.66  --   --     Estimated Creatinine Clearance: 34.7 mL/min (by C-G formula based on Cr of 0.66).   Medications:  Scheduled:  . acidophilus   Oral Daily  . calcium-vitamin D  1 tablet Oral BID  . diltiazem  240 mg Oral Daily  . escitalopram  10 mg Oral Daily  . feeding supplement (ENSURE ENLIVE)  237 mL Oral BID BM  . ferrous sulfate  325 mg Oral BID WC  . loratadine  10 mg Oral Daily  . metoprolol  50 mg Oral BID  . multivitamin with minerals   Oral Daily  . multivitamin-lutein  1 capsule Oral BID  . senna-docusate  1 tablet Oral BID  . warfarin  3.5 mg Oral q1800  . Warfarin - Pharmacist Dosing Inpatient   Does not apply q1800    Assessment: Pt is a 82 year old female s/p hip replacement. Pt has a PMH of afib on warfarin 3.5mg  daily. Pharmacy consulted to restart warfarin after surgery. Per Dr. Marry Guan, no bridging.    3/9 INR 1.20 3/10: INR 1.46 3/11: INR 1.45  Goal of Therapy:  INR 2-3 Monitor platelets by anticoagulation protocol: Yes   Plan:  Patient has received 1 time dose 5mg  on day one followed buy two days of home 3.5mg  dose.  Will give one time dose of 5mg  and most likely restart home dose tomorrow of 3.5 mg daily.  Recheck INR in the AM.  Nancy Fetter, PharmD Pharmacy Resident  03/01/2016 10:31 AM

## 2016-03-01 NOTE — Progress Notes (Signed)
Dr. Marry Guan okay'd daughter to bring in ensure for patient. She prefers her own brand to the hospitals.

## 2016-03-01 NOTE — Progress Notes (Signed)
Patient had lost iv access and is a difficult stick. Fluids now started.

## 2016-03-02 LAB — PROTIME-INR
INR: 1.37
Prothrombin Time: 17 seconds — ABNORMAL HIGH (ref 11.4–15.0)

## 2016-03-02 MED ORDER — WARFARIN SODIUM 5 MG PO TABS
5.0000 mg | ORAL_TABLET | Freq: Once | ORAL | Status: DC
  Filled 2016-03-02: qty 1

## 2016-03-02 NOTE — Progress Notes (Signed)
Patient is medically stable for D/C to [facility] today. Per RN at [facility] patient will go to room 210-A. RN will call report at [phone] and arrange EMS for transport. Clinical Social Worker (CSW) sent D/C Summary, FL2 and D/C Packet on Friday 02/29/16 to [facility]. MD updated D/C Summary today. CSW made RN at [facility] aware. Patient is aware of above. Patient's daughter Loletha Carrow is at bedside and aware of above. Please reconsult if future social work needs arise. CSW signing off.   Blima Rich, LCSW  [phone]

## 2016-03-02 NOTE — Progress Notes (Signed)
Physical Therapy Treatment Patient Details Name: Peggy [name] MRN: [id number] DOB: 12-29-1927 Today's Date: 03/02/2016    History of Present Illness Pt. is a 82 y.o. female who underwent a RTHA on 3/08.     PT Comments    Pt continues to make steady improvement with PT sessions.  She does well with ambulation today walking ~180 ft with walker and CGA.  She does fatigue with the effort, but has no significant safety concerns.  Pt has minimal pain and though she still needs assist with bed mobility is generally showing increased independence and confidence.   Follow Up Recommendations  SNF     Equipment Recommendations  Rolling walker with 5" wheels    Recommendations for Other Services       Precautions / Restrictions Precautions Precautions: Posterior Hip;Fall Restrictions Weight Bearing Restrictions: Yes RLE Weight Bearing: Weight bearing as tolerated    Mobility  Bed Mobility Overal bed mobility: Needs Assistance Bed Mobility: Supine to Sit     Supine to sit: Min assist     General bed mobility comments: Pt still needing assist to get up to EOB, but was able to do more on her own and with increased confidence.   Transfers Overall transfer level: Modified independent Equipment used: Rolling walker (2 wheeled) Transfers: Sit to/from Stand Sit to Stand: Min guard         General transfer comment: Pt shows good balance and ability to get to standing with minimal cuing for hand placement and set up.  Ambulation/Gait Ambulation/Gait assistance: Min guard Ambulation Distance (Feet): 180 Feet Assistive device: Rolling walker (2 wheeled)       General Gait Details: Pt continues to make improvements with ambulation.  She has increased speed and confidence today and is able to maintain forward walker movement even with R LE stance phase.     Stairs            Wheelchair Mobility    Modified Rankin (Stroke Patients Only)       Balance                                     Cognition Arousal/Alertness: Awake/alert Behavior During Therapy: WFL for tasks assessed/performed Overall Cognitive Status: Within Functional Limits for tasks assessed                      Exercises Total Joint Exercises Ankle Circles/Pumps: AROM;Both;20 reps;Supine Quad Sets: Right;20 reps;Strengthening Gluteal Sets: 20 reps;Strengthening;Right Heel Slides: 15 reps;Strengthening;Both Hip ABduction/ADduction: 20 reps;AROM;Both Straight Leg Raises: Right;AAROM;10 reps    General Comments        Pertinent Vitals/Pain Pain Score: 1     Home Living                      Prior Function            PT Goals (current goals can now be found in the care plan section) Progress towards PT goals: Progressing toward goals    Frequency  BID    PT Plan Current plan remains appropriate    Co-evaluation             End of Session Equipment Utilized During Treatment: Gait belt Activity Tolerance: Patient tolerated treatment well Patient left: in chair;with call bell/phone within reach     Time: 0925-0949 PT Time Calculation (min) (ACUTE ONLY): 24 min  Charges:  $Gait Training: 8-22 mins $Therapeutic Exercise: 8-22 mins                    G Codes:     Wayne Both, [place], DPT [phone]  Kreg Shropshire 03/02/2016, 11:49 AM

## 2016-03-02 NOTE — Progress Notes (Signed)
ORTHOPAEDICS PROGRESS NOTE  PATIENT NAME: Peggy [name] DOB: 1928/01/11  MRN: [id number]  POD # 4: Right total hip arthroplasty  Subjective: The patient is up in the chair this morning. She is feeling much better. She denies any dizziness or lightheadedness.  Objective: Vital signs in last 24 hours: Temp:  [98.4 F (36.9 C)-99.1 F (37.3 C)] 99.1 F (37.3 C) (03/12 0738) Pulse Rate:  [85-106] 87 (03/12 0738) Resp:  [16-18] 16 (03/12 0509) BP: (119-139)/(61-77) 137/67 mmHg (03/12 0738) SpO2:  [95 %-97 %] 97 % (03/12 0738)  Intake/Output from previous day: 03/11 0701 - 03/12 0700 In: 720 [P.O.:720] Out: 350 [Urine:350]   Recent Labs  02/29/16 0454 03/01/16 0323 03/01/16 0810 03/02/16 0335  WBC 13.5*  --  11.6*  --   HGB 11.2*  --  11.1*  --   HCT 33.2*  --  32.6*  --   PLT 221  --  246  --   K 3.7  --   --   --   CL 105  --   --   --   CO2 25  --   --   --   BUN 7  --   --   --   CREATININE 0.66  --   --   --   GLUCOSE 118*  --   --   --   CALCIUM 8.4*  --   --   --   INR 1.46 1.45  --  1.37    EXAM General: Awake, alert, and oriented. Lungs: clear to auscultation Right lower extremity: Hip dressing is intact. No significant swelling. Homans test is negative. Neurologic: Awake, alert, and oriented. Sensory and motor function is grossly intact.  Assessment: Active Problems:   S/P total hip arthroplasty  Plan: Notes from physical therapy were reviewed. The patient made good progress yesterday. Stable for transfer to [facility] today.  Sabrina Keough P. Holley Bouche M.D.

## 2016-03-02 NOTE — Clinical Social Work Placement (Signed)
   CLINICAL SOCIAL WORK PLACEMENT  NOTE  Date:  03/02/2016  Patient Details  Name: Peggy [name] MRN: [id number] Date of Birth: 10/30/1928  Clinical Social Work is seeking post-discharge placement for this patient at the [place] level of care (*CSW will initial, date and re-position this form in  chart as items are completed):  Yes   Patient/family provided with [place] Work Department's list of facilities offering this level of care within the geographic area requested by the patient (or if unable, by the patient's family).  Yes   Patient/family informed of their freedom to choose among providers that offer the needed level of care, that participate in Medicare, Medicaid or managed care program needed by the patient, have an available bed and are willing to accept the patient.  Yes   Patient/family informed of [facility]'s ownership interest in [facility] and [facility], as well as of the fact that they are under no obligation to receive care at these facilities.  PASRR submitted to EDS on 02/27/16     PASRR number received on 02/27/16     Existing PASRR number confirmed on       FL2 transmitted to all facilities in geographic area requested by pt/family on 02/28/16     FL2 transmitted to all facilities within larger geographic area on       Patient informed that his/her managed care company has contracts with or will negotiate with certain facilities, including the following:        Yes   Patient/family informed of bed offers received.  Patient chooses bed at  [facility] )     Physician recommends and patient chooses bed at      Patient to be transferred to  [facility] ) on 03/02/16.  Patient to be transferred to facility by  [facility] )     Patient family notified on 03/02/16 of transfer.  Name of family member notified:   (Daughter Loletha Carrow is at bedside and aware of D/C today. )     PHYSICIAN        Additional Comment:    _______________________________________________ Loralyn Freshwater, LCSW 03/02/2016, 11:32 AM

## 2016-03-02 NOTE — Consult Note (Signed)
ANTICOAGULATION CONSULT NOTE - follow up [place] for warfarin Indication: atrial fibrillation  Allergies  Allergen Reactions  . Alendronate Sodium     REACTION: questionable  . Amoxicillin-Pot Clavulanate   . Etodolac   . Ibandronate Sodium     REACTION: questionable  . Iodine     Other reaction(s): Unknown  . Lactose Intolerance (Gi) Other (See Comments)    GI problems  . Minocycline Hcl   . Risedronate Sodium   . Sulfonamide Derivatives Other (See Comments)    Stomach pain  . Zoledronic Acid     REACTION: "dental problems"    Patient Measurements: Height: 4\' 8"  (142.2 cm) Weight: 115 lb (52.164 kg) IBW/kg (Calculated) : 36.3 Heparin Dosing Weight:   Vital Signs: Temp: 99.1 F (37.3 C) (03/12 0738) Temp Source: Oral (03/12 0738) BP: 137/67 mmHg (03/12 0738) Pulse Rate: 87 (03/12 0738)  Labs:  Recent Labs  02/29/16 0454 03/01/16 0323 03/01/16 0810 03/02/16 0335  HGB 11.2*  --  11.1*  --   HCT 33.2*  --  32.6*  --   PLT 221  --  246  --   LABPROT 17.8* 17.7*  --  17.0*  INR 1.46 1.45  --  1.37  CREATININE 0.66  --   --   --     Estimated Creatinine Clearance: 34.7 mL/min (by C-G formula based on Cr of 0.66).   Medications:  Scheduled:  . acidophilus   Oral Daily  . calcium-vitamin D  1 tablet Oral BID  . diltiazem  240 mg Oral Daily  . escitalopram  10 mg Oral Daily  . feeding supplement (ENSURE ENLIVE)  237 mL Oral BID BM  . ferrous sulfate  325 mg Oral BID WC  . loratadine  10 mg Oral Daily  . metoprolol  50 mg Oral BID  . multivitamin with minerals   Oral Daily  . multivitamin-lutein  1 capsule Oral BID  . senna-docusate  1 tablet Oral BID  . Warfarin - Pharmacist Dosing Inpatient   Does not apply q1800    Assessment: Pt is a 82 year old female s/p hip replacement. Pt has a PMH of afib on warfarin 3.5mg  daily. Pharmacy consulted to restart warfarin after surgery. Per Dr. Marry Guan, no bridging.   3/9:  INR 1.20     Warfarin  3.5 mg  3/10: INR 1.46    Warfarin 3.5 mg   3/11: INR 1.45    Warfarin 5 mg 3/12: INR 1.37  Goal of Therapy:  INR 2-3 Monitor platelets by anticoagulation protocol: Yes   Plan:  Patient has received 1 time dose 5mg  on day one followed buy two days of home 3.5mg  dose.  Will give one time dose of 5mg  and most likely restart home dose tomorrow of 3.5 mg daily.  3/12: INR still subtherapeutic. Will give another dose of Warfarin 5 mg.  Recheck INR in the AM.  Chinita Greenland PharmD Clinical Pharmacist 03/02/2016  9:50 AM

## 2016-03-02 NOTE — Progress Notes (Signed)
Called report to [place] at [facility]. IV removed, belongings packed. EMS called.

## 2016-03-03 ENCOUNTER — Emergency Department: Payer: Medicare Other

## 2016-03-03 ENCOUNTER — Emergency Department
Admission: EM | Admit: 2016-03-03 | Discharge: 2016-03-03 | Disposition: A | Discharge status: Home / Self Care | Payer: Medicare Other | Attending: Emergency Medicine | Admitting: Emergency Medicine

## 2016-03-03 ENCOUNTER — Encounter: Payer: Self-pay | Admitting: Medical Oncology

## 2016-03-03 DIAGNOSIS — Z880 Allergy status to penicillin: Secondary | ICD-10-CM | POA: Diagnosis not present

## 2016-03-03 DIAGNOSIS — Z7901 Long term (current) use of anticoagulants: Secondary | ICD-10-CM | POA: Diagnosis not present

## 2016-03-03 DIAGNOSIS — I4891 Unspecified atrial fibrillation: Secondary | ICD-10-CM | POA: Insufficient documentation

## 2016-03-03 DIAGNOSIS — Z7982 Long term (current) use of aspirin: Secondary | ICD-10-CM | POA: Insufficient documentation

## 2016-03-03 DIAGNOSIS — I10 Essential (primary) hypertension: Secondary | ICD-10-CM | POA: Insufficient documentation

## 2016-03-03 DIAGNOSIS — R002 Palpitations: Secondary | ICD-10-CM | POA: Diagnosis present

## 2016-03-03 DIAGNOSIS — Z79899 Other long term (current) drug therapy: Secondary | ICD-10-CM | POA: Insufficient documentation

## 2016-03-03 LAB — BASIC METABOLIC PANEL
ANION GAP: 8 (ref 5–15)
BUN: 13 mg/dL (ref 6–20)
CHLORIDE: 106 mmol/L (ref 101–111)
CO2: 23 mmol/L (ref 22–32)
Calcium: 8.3 mg/dL — ABNORMAL LOW (ref 8.9–10.3)
Creatinine, Ser: 0.69 mg/dL (ref 0.44–1.00)
Glucose, Bld: 109 mg/dL — ABNORMAL HIGH (ref 65–99)
POTASSIUM: 3.3 mmol/L — AB (ref 3.5–5.1)
SODIUM: 137 mmol/L (ref 135–145)

## 2016-03-03 LAB — CBC
HCT: 30.7 % — ABNORMAL LOW (ref 35.0–47.0)
Hemoglobin: 10.5 g/dL — ABNORMAL LOW (ref 12.0–16.0)
MCH: 33.2 pg (ref 26.0–34.0)
MCHC: 34.2 g/dL (ref 32.0–36.0)
MCV: 96.9 fL (ref 80.0–100.0)
PLATELETS: 274 10*3/uL (ref 150–440)
RBC: 3.17 MIL/uL — ABNORMAL LOW (ref 3.80–5.20)
RDW: 13.3 % (ref 11.5–14.5)
WBC: 9.4 10*3/uL (ref 3.6–11.0)

## 2016-03-03 LAB — TROPONIN I: TROPONIN I: 0.03 ng/mL (ref <0.031)

## 2016-03-03 LAB — PROTIME-INR
INR: 1.22
PROTHROMBIN TIME: 15.6 s — AB (ref 11.4–15.0)

## 2016-03-03 MED ORDER — DILTIAZEM HCL ER COATED BEADS 240 MG PO CP24
240.0000 mg | ORAL_CAPSULE | Freq: Once | ORAL | Status: AC
  Administered 2016-03-03: 240 mg via ORAL
  Filled 2016-03-03: qty 1

## 2016-03-03 MED ORDER — DILTIAZEM HCL 25 MG/5ML IV SOLN
10.0000 mg | Freq: Once | INTRAVENOUS | Status: AC
  Administered 2016-03-03: 10 mg via INTRAVENOUS

## 2016-03-03 MED ORDER — DILTIAZEM HCL 25 MG/5ML IV SOLN
INTRAVENOUS | Status: AC
  Administered 2016-03-03: 10 mg via INTRAVENOUS
  Filled 2016-03-03: qty 5

## 2016-03-03 MED ORDER — SODIUM CHLORIDE 0.9 % IV BOLUS (SEPSIS)
500.0000 mL | Freq: Once | INTRAVENOUS | Status: AC
  Administered 2016-03-03: 500 mL via INTRAVENOUS

## 2016-03-03 NOTE — Discharge Instructions (Signed)

## 2016-03-03 NOTE — ED Notes (Signed)
Pt reports she woke up this am feeling like her heart is racing. Per pt she has a hx of afib and if she takes her am meds it will go away, staff at facility would not administer own med. Pt reports she is having some discomfort to center of chest. Pt denies sob.

## 2016-03-03 NOTE — ED Notes (Signed)
EMS here to transport pt back to facility.

## 2016-03-03 NOTE — ED Notes (Signed)
Lab called regarding results, still in process.

## 2016-03-03 NOTE — ED Notes (Signed)
Pts brief changed and pt repositioned in bed. Pt resting comfortably and HR decreased to 110.

## 2016-03-03 NOTE — ED Provider Notes (Signed)
[facility] Emergency Department Provider Note  ____________________________________________    I have reviewed the triage vital signs and the nursing notes.   HISTORY  Chief Complaint Chest Pain    HPI Peggy [name] is a 82 y.o. female who presents with complaints of palpitations.Patient has a history of atrial fibrillation and does take Coumadin. Typically she takes diltiazem. This morning she woke up and felt her heart was racing. She denies chest pain to me. No shortness of breath. No fevers or chills or cough. No calf pain or swelling. No recent travel. She reports overall she feels well     Past Medical History  Diagnosis Date  . Essential hypertension, benign   . Arthritis   . Heart disease 2001  . Hypertension 1996  . Stroke [facility]) 2009  . Unspecified essential hypertension   . Hearing disorder 2012    hearing aids  . Reflux     acid reflux  . Breast screening, unspecified   . Lump or mass in breast   . Special screening for malignant neoplasms, colon   . Other benign neoplasm of connective and other soft tissue of thorax   . Personal history of malignant neoplasm of breast   . Atrial fibrillation ([place])   . Allergy   . Atrial fibrillation ([place])   . Atrial fibrillation with rapid ventricular response [facility]) November 2015    Heart rate up to 140 requiring IV Cardizem  . Breast cancer [facility]) 2012    Patient underwent a left mastectomy on 03-31-11. Pathology showed DCIS at both sites without an invasive component. Sentinel nodes were negative. The breast lesions were noted to be ER +40%, PR-positive, 5%.   . Malignant neoplasm of upper-inner quadrant of female breast ([place])   . Malignant neoplasm of upper-outer quadrant of female breast ([place])   . Hip pain   . Coronary artery disease   . Heart attack ([place]) 2001  . Anxiety   . GERD (gastroesophageal reflux disease)   . Diverticulosis   . Hyperlipidemia     Patient Active Problem List    Diagnosis Date Noted  . S/P total hip arthroplasty 02/27/2016  . Hip pain, acute 01/15/2016  . Hip pain 01/15/2016  . Memory loss 03/21/2015  . Abnormality of gait 03/21/2015  . Chest pain 03/11/2015  . Atrial fibrillation with RVR ([place]) 03/11/2015  . H/O: CVA (cerebrovascular accident) 03/11/2015  . 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 Blurred vision 09/05/2013  . 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 EPIGASTRIC PAIN 02/14/2009  . NONSPECIFIC ABN FINDNG RAD&OTH EXAM BILARY TRCT 02/14/2009    Past Surgical History  Procedure Laterality Date  . Mastectomy  2012    left breast  . Breast biopsy      left breast biopsy over 15 years ago   . Upper gi endoscopy  2009    [facility] Dr. Donnella Sham  . Colonoscopy  2009    [facility] Dr. Donnella Sham  . Appendectomy      age 31  . Tonsillectomy      as a child  . Cholecystectomy      40 years ago   . Abdominal hysterectomy      age 87  . Small intestine surgery  12/31/2012    Abdominal exploration, lysis of adhesions for distal small bowel obstruction. Rochel Brome, MD  . Eye surgery Bilateral     Cataract Extraction with IOL  .  Hernia repair    . Colon surgery      Dr. Tamala Julian, [facility]  . Total hip arthroplasty Right 02/27/2016    Procedure: TOTAL HIP ARTHROPLASTY;  Surgeon: Dereck Leep, MD;  Location: [facility] ORS;  Service: Orthopedics;  Laterality: Right;    Current Outpatient Rx  Name  Route  Sig  Dispense  Refill  . acetaminophen (TYLENOL) 325 MG tablet   Oral   Take 325 mg by mouth every 6 (six) hours as needed for mild pain.          Marland Kitchen ALPRAZolam (XANAX) 0.25 MG tablet   Oral   Take 0.25 mg by mouth at bedtime as needed for anxiety. For sleep         . aspirin 81 MG tablet   Oral   Take 81 mg by mouth daily.         . Calcium Carbonate-Vitamin D (RA CALCIUM PLUS VITAMIN D) 600-400 MG-UNIT tablet   Oral   Take 1 tablet by mouth 2 (two) times  daily.         Marland Kitchen diltiazem (CARDIZEM CD) 240 MG 24 hr capsule   Oral   Take 240 mg by mouth daily.         Marland Kitchen docusate sodium (COLACE) 100 MG capsule   Oral   Take 100 mg by mouth daily as needed for mild constipation.         Marland Kitchen escitalopram (LEXAPRO) 10 MG tablet   Oral   Take 10 mg by mouth daily.         . fexofenadine (ALLEGRA) 180 MG tablet   Oral   Take 180 mg by mouth as needed for allergies or rhinitis.         Marland Kitchen HYDROcodone-acetaminophen (NORCO/VICODIN) 5-325 MG tablet   Oral   Take 1-2 tablets by mouth every 4 (four) hours as needed for moderate pain.   30 tablet   0   . lansoprazole (PREVACID) 15 MG capsule   Oral   Take 30 mg by mouth daily.         . Menthol, Topical Analgesic, (BENGAY VANISHING SCENT) 2.5 % GEL   Apply externally   Apply topically as needed.         . metoprolol (LOPRESSOR) 50 MG tablet   Oral   Take 50 mg by mouth 2 (two) times daily.         . Multiple Vitamins-Minerals (CENTRUM SILVER PO)   Oral   Take 1 tablet by mouth daily.         . Multiple Vitamins-Minerals (PRESERVISION AREDS 2) CAPS   Oral   Take 1 tablet by mouth 2 (two) times daily.         . nitroGLYCERIN (NITROSTAT) 0.4 MG SL tablet   Sublingual   Place 0.4 mg under the tongue every 5 (five) minutes as needed for chest pain. Every 5 minutes as needed         . Probiotic Product (PROBIOTIC DAILY PO)   Oral   Take 1 capsule by mouth daily.         Marland Kitchen tiZANidine (ZANAFLEX) 2 MG tablet   Oral   Take 2 mg by mouth 3 (three) times daily as needed for muscle spasms.         . traMADol (ULTRAM) 50 MG tablet      Take 1-2 tablets by mouth every 6 hours as needed for moderate to severe pain   30 tablet  0   . traMADol (ULTRAM) 50 MG tablet   Oral   Take 1-2 tablets (50-100 mg total) by mouth every 4 (four) hours as needed for moderate pain.   30 tablet   0   . warfarin (COUMADIN) 3 MG tablet   Oral   Take 3 mg by mouth daily at 6 PM.  One-half of a one mg tablet (Total 3.5 mg) every evening           Allergies Alendronate sodium; Amoxicillin-pot clavulanate; Etodolac; Ibandronate sodium; Iodine; Lactose intolerance (gi); Minocycline hcl; Risedronate sodium; Sulfonamide derivatives; and Zoledronic acid  Family History  Problem Relation Age of Onset  . Colon cancer Neg Hx   . Ovarian cancer Neg Hx   . Breast cancer Sister     x 2, in their 73's  . Lung cancer Brother   . Stroke Father   . Heart disease Father   . Heart disease Mother     Social History Social History  Substance Use Topics  . Smoking status: Never Smoker   . Smokeless tobacco: Never Used  . Alcohol Use: No    Review of Systems  Constitutional: Negative for fever. No dizziness Eyes: Negative for redness ENT: Negative for sore throat Cardiovascular: Negative for chest pain. Positive palpitations as above Respiratory: Negative for shortness of breath. Gastrointestinal: Negative for abdominal pain, no nausea Genitourinary: Negative for dysuria. Musculoskeletal: Negative for back pain. Skin: Negative for rash. Neurological: Negative for focal weakness Psychiatric: no anxiety    ____________________________________________   PHYSICAL EXAM:  VITAL SIGNS: ED Triage Vitals  Enc Vitals Group     BP 03/03/16 0724 93/74 mmHg     Pulse Rate 03/03/16 0724 151     Resp 03/03/16 0724 19     Temp 03/03/16 0724 98 F (36.7 C)     Temp Source 03/03/16 0724 Oral     SpO2 03/03/16 0724 97 %     Weight 03/03/16 0724 110 lb (49.896 kg)     Height 03/03/16 0724 4\' 8"  (1.422 m)     Head Cir --      Peak Flow --      Pain Score 03/03/16 0725 3     Pain Loc --      Pain Edu? --      Excl. in [place]? --     Constitutional: Alert and oriented. Well appearing and in no distress. Pleasant and interactive Eyes: Conjunctivae are normal. No erythema or injection ENT   Head: Normocephalic and atraumatic.   Mouth/Throat: Mucous membranes are  moist. Cardiovascular: Tachycardia, irregularly irregular rhythm. Normal and symmetric distal pulses are present in the upper extremities.  Respiratory: Normal respiratory effort without tachypnea nor retractions. Breath sounds are clear and equal bilaterally.  Gastrointestinal: Soft and non-tender in all quadrants. No distention. There is no CVA tenderness. Genitourinary: deferred Musculoskeletal: Nontender with normal range of motion in all extremities. No lower extremity tenderness nor edema. Neurologic:  Normal speech and language. No gross focal neurologic deficits are appreciated. Skin:  Skin is warm, dry and intact. No rash noted. Psychiatric: Mood and affect are normal. Patient exhibits appropriate insight and judgment.  ____________________________________________    LABS (pertinent positives/negatives)  Labs Reviewed  BASIC METABOLIC PANEL  CBC  TROPONIN I  PROTIME-INR    ____________________________________________   EKG  ED ECG REPORT I, Lavonia Drafts, the attending physician, personally viewed and interpreted this ECG.   Date: 03/03/2016  EKG Time: 7:23 AM  Rate: 156  Rhythm: atrial fibrillation, rate 156  Axis: Normal  Intervals:A. fib  ST&T Change: Nonspecific   ____________________________________________    RADIOLOGY  Chest x-ray unremarkable  ____________________________________________   PROCEDURES  Procedure(s) performed: none  Critical Care performed: yes  CRITICAL CARE Performed by: Lavonia Drafts   Total critical care time: 30 minutes  Critical care time was exclusive of separately billable procedures and treating other patients.  Critical care was necessary to treat or prevent imminent or life-threatening deterioration.  Critical care was time spent personally by me on the following activities: development of treatment plan with patient and/or surrogate as well as nursing, discussions with consultants, evaluation of patient's  response to treatment, examination of patient, obtaining history from patient or surrogate, ordering and performing treatments and interventions, ordering and review of laboratory studies, ordering and review of radiographic studies, pulse oximetry and re-evaluation of patient's condition.   ____________________________________________   INITIAL IMPRESSION / ASSESSMENT AND PLAN / ED COURSE  Pertinent labs & imaging results that were available during my care of the patient were reviewed by me and considered in my medical decision making (see chart for details).  Patient presents with atrial fibrillation with rapid ventricular response. She denies chest pain to me. Initial blood pressure is 90/60. We will give 500 cc bolus and then once blood pressure is improved 10 of IV Cardizem.  Patient had good response to cardizem, discussed with Dr. Saralyn Pilar of cardiology and we agreed to give her her home dose of Cardizem 240 CD now and gauge her response  ----------------------------------------- 10:20 AM on 03/03/2016 -----------------------------------------  Patient's heart rate has been controlled. Her labs are unremarkable although her INR is subtherapeutic which I discussed with the patient. Feel she is appropriate for discharge at this time as she has no symptoms and her heart rate has improved greatly ____________________________________________   FINAL CLINICAL IMPRESSION(S) / ED DIAGNOSES  Final diagnoses:  Atrial fibrillation with RVR (HCC)          Lavonia Drafts, MD 03/03/16 1213

## 2016-03-03 NOTE — ED Notes (Signed)
Pt informed to return if any life threatening symptoms occur. [facility] contacted and report given to nurse.

## 2016-03-03 NOTE — ED Notes (Signed)
Assisted pt with cleaning up after using the restroom.

## 2016-03-03 NOTE — ED Notes (Signed)
Report given to [facility], pts nurse at [facility] place.

## 2016-03-03 NOTE — ED Notes (Signed)
Pts brief changed again, pt repositioned comfortably.

## 2016-03-04 LAB — PROTIME-INR
INR: 1.49
PROTHROMBIN TIME: 18.1 s — AB (ref 11.4–15.0)

## 2016-03-10 LAB — PROTIME-INR
INR: 2.05
PROTHROMBIN TIME: 23 s — AB (ref 11.4–15.0)

## 2016-03-11 ENCOUNTER — Ambulatory Visit: Payer: Medicare Other | Admitting: General Surgery

## 2016-03-13 LAB — PROTIME-INR
INR: 3.32
PROTHROMBIN TIME: 33 s — AB (ref 11.4–15.0)

## 2016-03-18 LAB — PROTIME-INR
INR: 5.01 — AB
PROTHROMBIN TIME: 45 s — AB (ref 11.4–15.0)

## 2016-03-20 LAB — PROTIME-INR
INR: 3.28
PROTHROMBIN TIME: 32.7 s — AB (ref 11.4–15.0)

## 2016-03-21 LAB — CBC WITH DIFFERENTIAL/PLATELET
BASOS ABS: 0.1 10*3/uL (ref 0–0.1)
BASOS PCT: 1 %
EOS ABS: 0.1 10*3/uL (ref 0–0.7)
Eosinophils Relative: 1 %
HCT: 31.6 % — ABNORMAL LOW (ref 35.0–47.0)
HEMOGLOBIN: 10.7 g/dL — AB (ref 12.0–16.0)
LYMPHS PCT: 17 %
Lymphs Abs: 1.6 10*3/uL (ref 1.0–3.6)
MCH: 32.4 pg (ref 26.0–34.0)
MCHC: 33.8 g/dL (ref 32.0–36.0)
MCV: 95.7 fL (ref 80.0–100.0)
MONOS PCT: 13 %
Monocytes Absolute: 1.2 10*3/uL — ABNORMAL HIGH (ref 0.2–0.9)
NEUTROS ABS: 6.5 10*3/uL (ref 1.4–6.5)
NEUTROS PCT: 68 %
Platelets: 377 10*3/uL (ref 150–440)
RBC: 3.3 MIL/uL — AB (ref 3.80–5.20)
RDW: 13.4 % (ref 11.5–14.5)
WBC: 9.5 10*3/uL (ref 3.6–11.0)

## 2016-03-21 LAB — COMPREHENSIVE METABOLIC PANEL
ALBUMIN: 2.9 g/dL — AB (ref 3.5–5.0)
ALK PHOS: 100 U/L (ref 38–126)
ALT: 13 U/L — AB (ref 14–54)
AST: 20 U/L (ref 15–41)
Anion gap: 6 (ref 5–15)
BUN: 14 mg/dL (ref 6–20)
CHLORIDE: 104 mmol/L (ref 101–111)
CO2: 27 mmol/L (ref 22–32)
CREATININE: 0.8 mg/dL (ref 0.44–1.00)
Calcium: 8.5 mg/dL — ABNORMAL LOW (ref 8.9–10.3)
GFR calc Af Amer: >60 mL/min (ref >60)
GFR calc non Af Amer: >60 mL/min (ref >60)
GLUCOSE: 143 mg/dL — AB (ref 65–99)
Potassium: 4.2 mmol/L (ref 3.5–5.1)
SODIUM: 137 mmol/L (ref 135–145)
Total Bilirubin: 0.5 mg/dL (ref 0.3–1.2)
Total Protein: 5.9 g/dL — ABNORMAL LOW (ref 6.5–8.1)

## 2016-03-21 LAB — TSH: TSH: 1.317 u[IU]/mL (ref 0.350–4.500)

## 2016-03-21 LAB — VITAMIN B12: Vitamin B-12: 173 pg/mL — ABNORMAL LOW (ref 180–914)

## 2016-03-22 ENCOUNTER — Encounter
Admission: RE | Admit: 2016-03-22 | Discharge: 2016-03-22 | Disposition: A | Discharge status: Home / Self Care | Payer: Medicare Other | Source: Ambulatory Visit | Attending: Internal Medicine | Admitting: Internal Medicine

## 2016-03-22 LAB — PROTIME-INR
INR: 1.78
PROTHROMBIN TIME: 20.7 s — AB (ref 11.4–15.0)

## 2016-03-25 LAB — PROTIME-INR
INR: 2.05
PROTHROMBIN TIME: 23 s — AB (ref 11.4–15.0)

## 2016-03-25 IMAGING — CR DG CHEST 1V PORT
1 series · 1 of 1 positions shown · non-contrast
Comparison: 10/01/2012

CLINICAL DATA: Chest pain

EXAM:
PORTABLE CHEST - 1 VIEW

[ap]
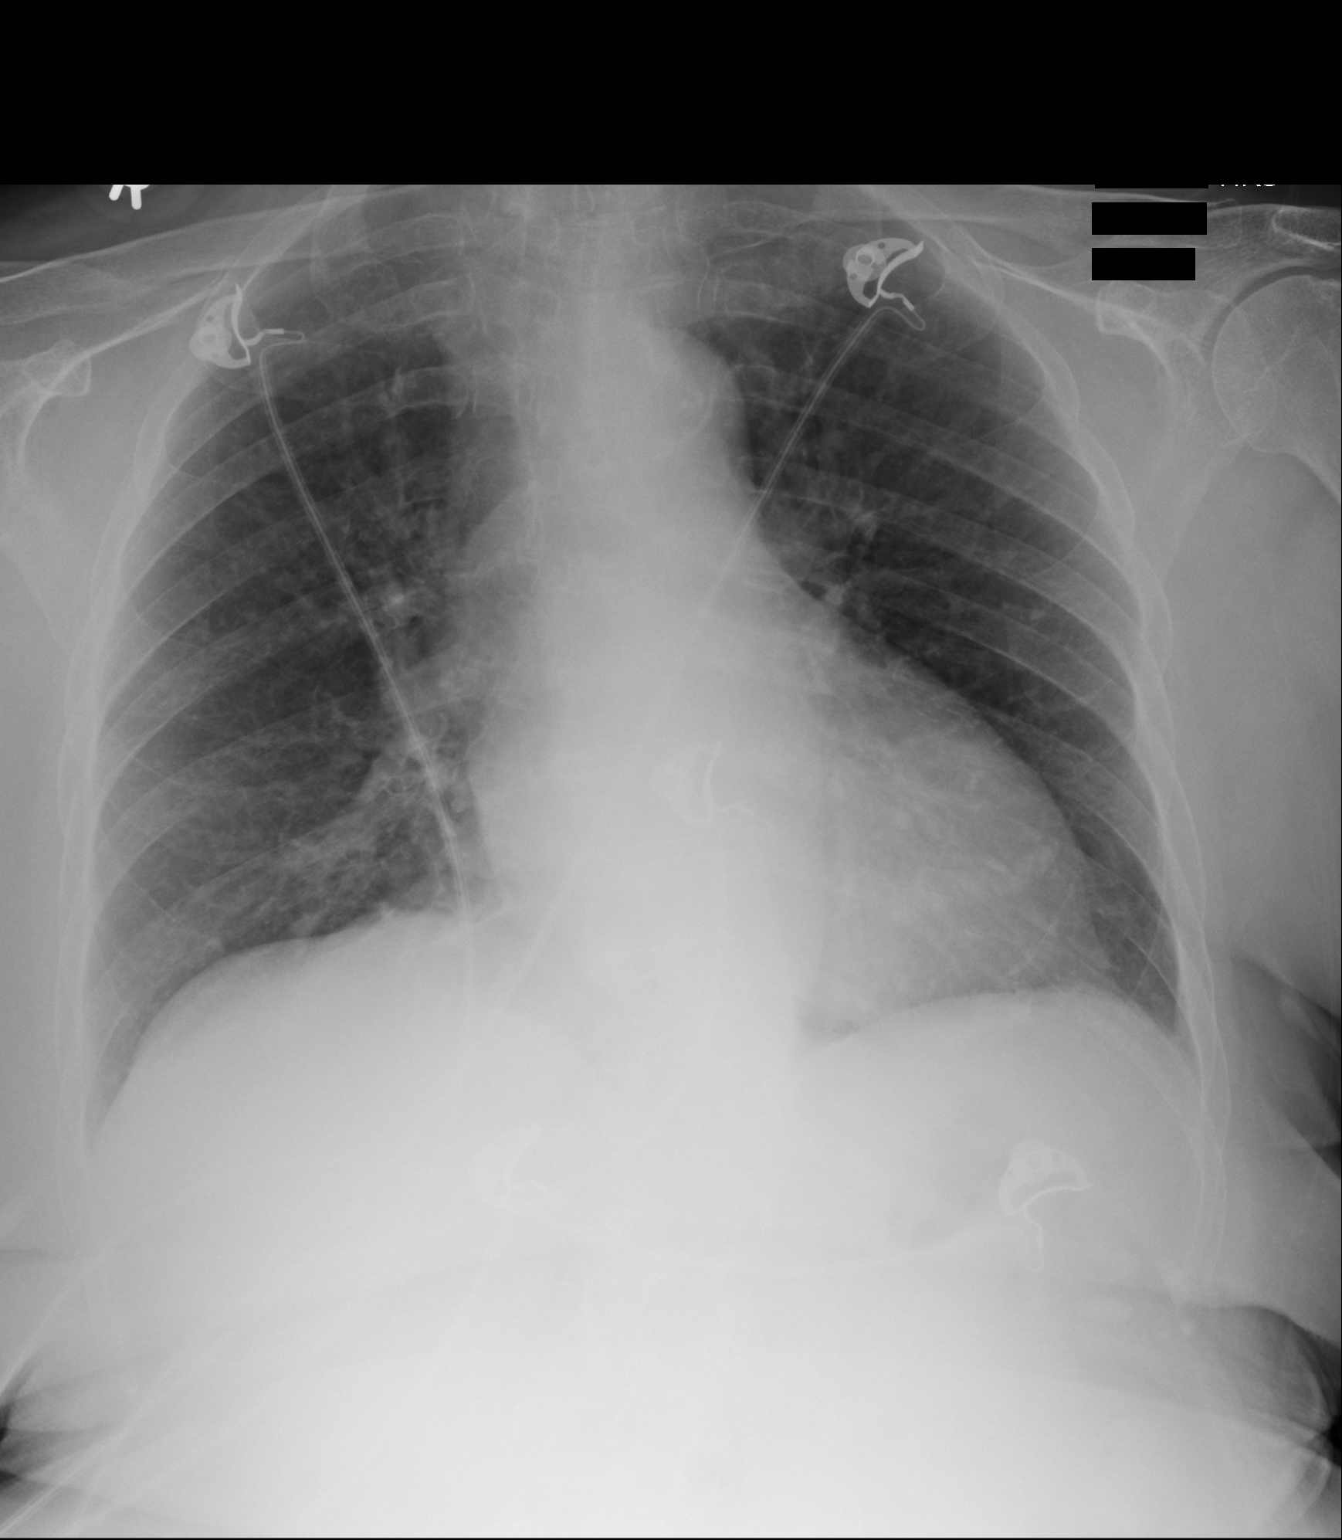

[1 of 1 positions shown; findings below may reference images not displayed]

FINDINGS: Cardiomediastinal silhouette is unremarkable. No acute infiltrate or
pleural effusion. No pulmonary edema. Atherosclerotic calcifications
of thoracic aorta.
IMPRESSION: No active disease.

## 2016-03-25 IMAGING — CT CT ANGIO CHEST
2 of 6 series · 18 of 46 positions shown · IV contrast (APPLIED)
Comparison: CTA chest 02/10/2012, 05/31/2011.

CLINICAL DATA: Right-sided chest pain radiating into the right
shoulder. Prior history of MI.

EXAM:
CT ANGIOGRAPHY CHEST WITH CONTRAST
TECHNIQUE: Multidetector CT imaging of the chest was performed using the
standard protocol during bolus administration of intravenous
contrast. Multiplanar CT image reconstructions and MIPs were
obtained to evaluate the vascular anatomy.
CONTRAST:  100 ml Isovue 370 IV.

[Series 5: pe thins 1.5 · axial · 0.60mm/px · z∈[-619,-384]mm · 15 of 220 slices shown]
[im 12/220  lung]
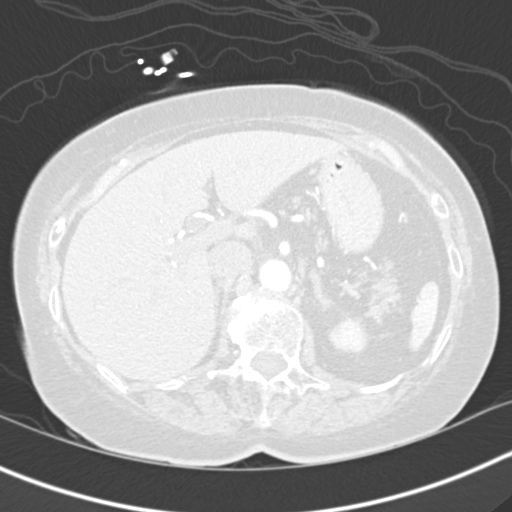
[im 24/220  soft-tissue]
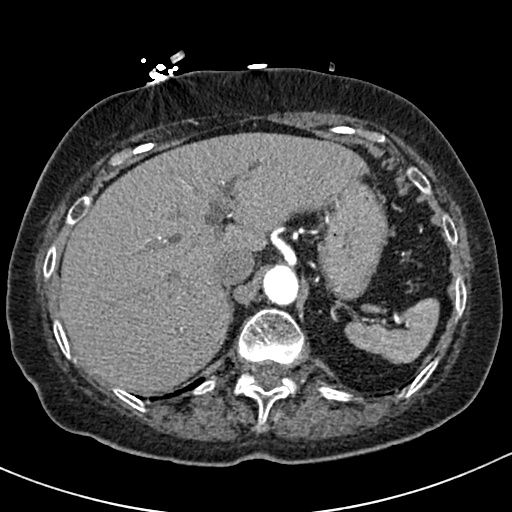
[im 47/220  lung]
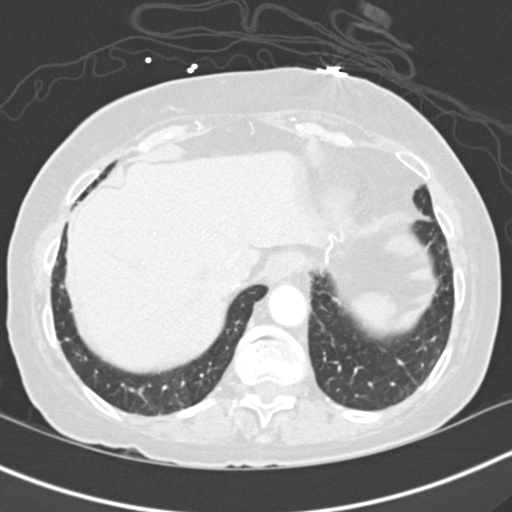
[im 58/220  soft-tissue]
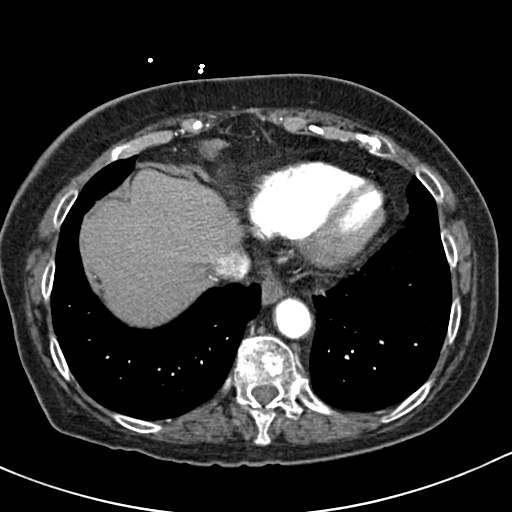
[im 70/220  lung]
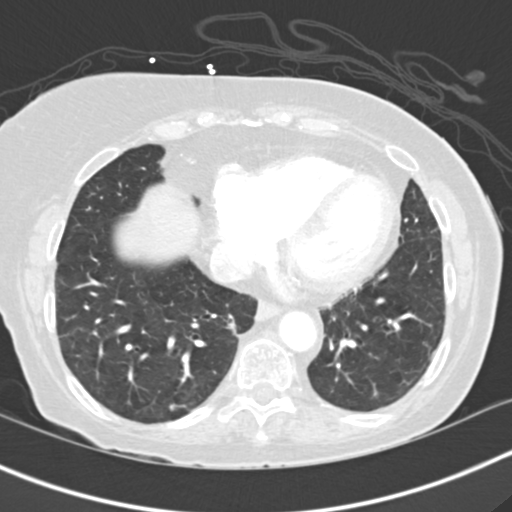
[im 81/220  soft-tissue]
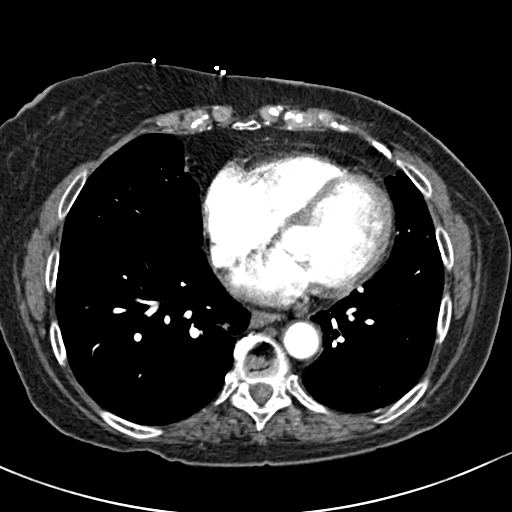
[im 93/220  lung]
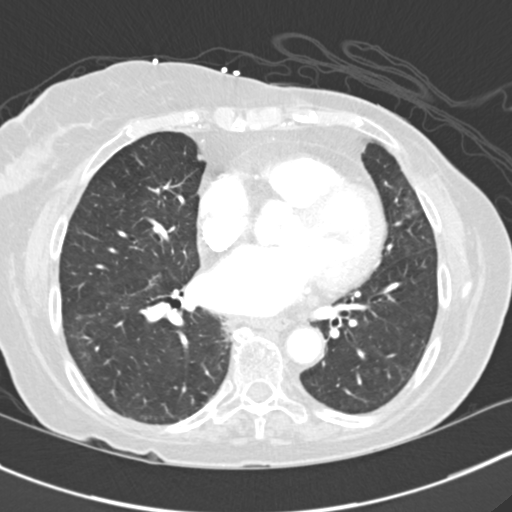
[im 116/220  soft-tissue]
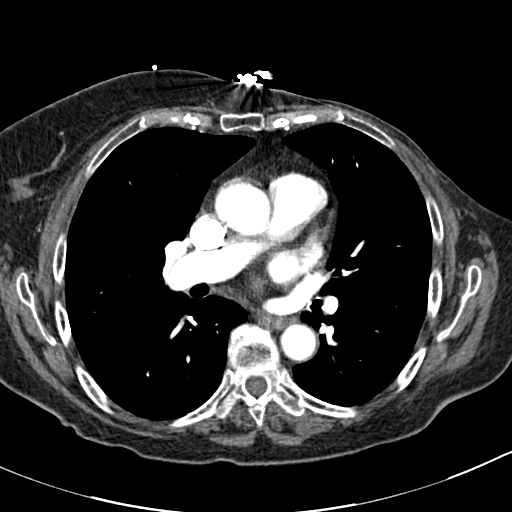
[im 127/220  lung]
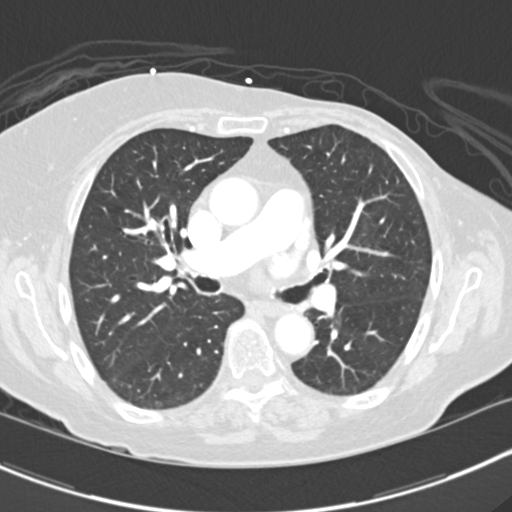
[im 139/220  soft-tissue]
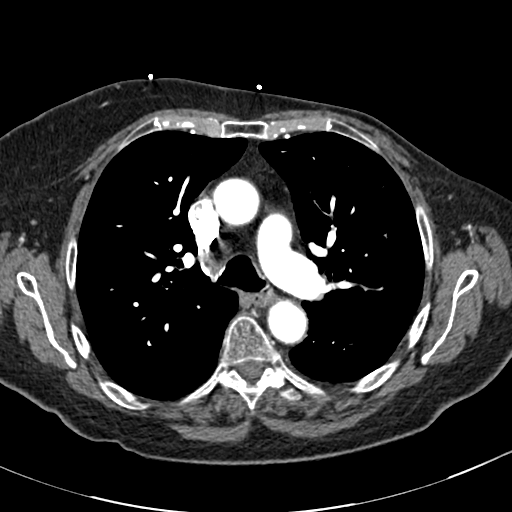
[im 150/220  lung]
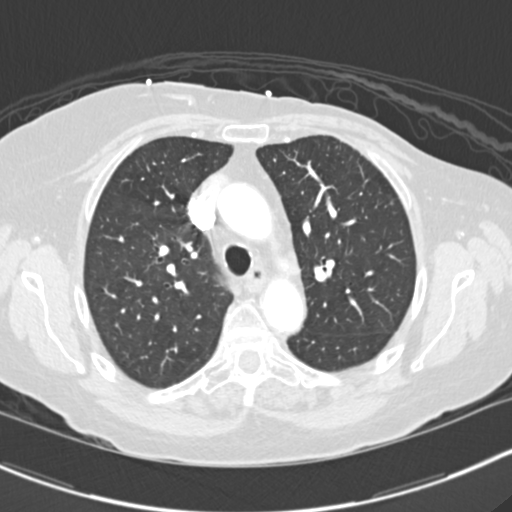
[im 162/220  soft-tissue]
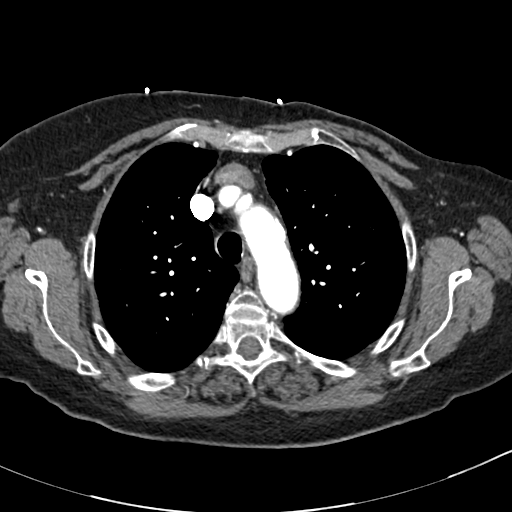
[im 185/220  lung]
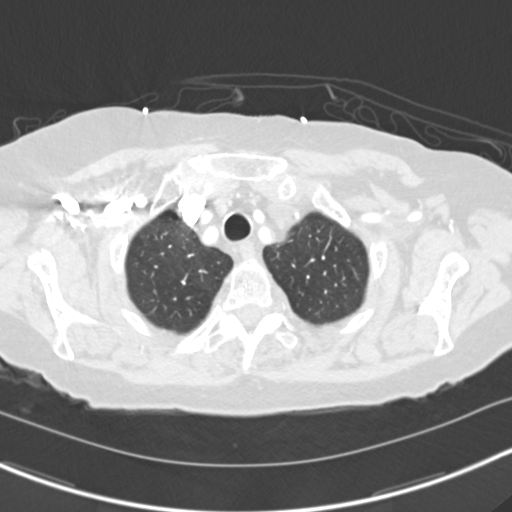
[im 196/220  soft-tissue]
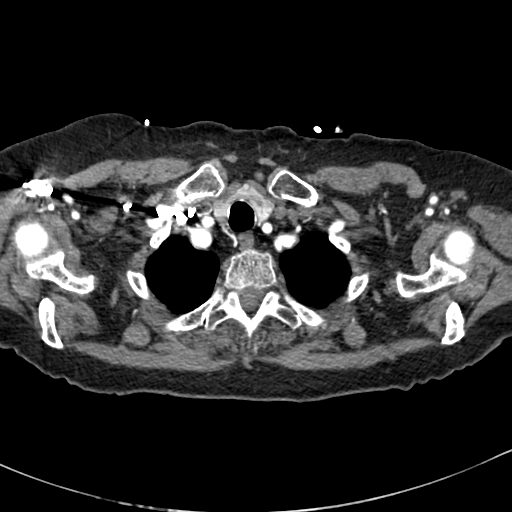
[im 208/220  lung]
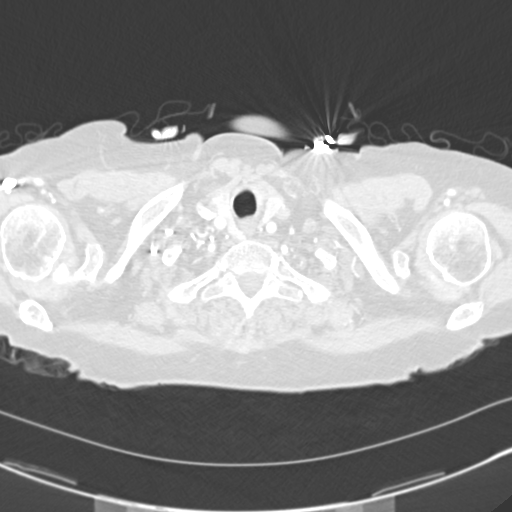

[Series 7: cor mpr 2.0 · coronal · 0.61mm/px · 3 of 110 slices shown]
[im 28/110  soft-tissue]
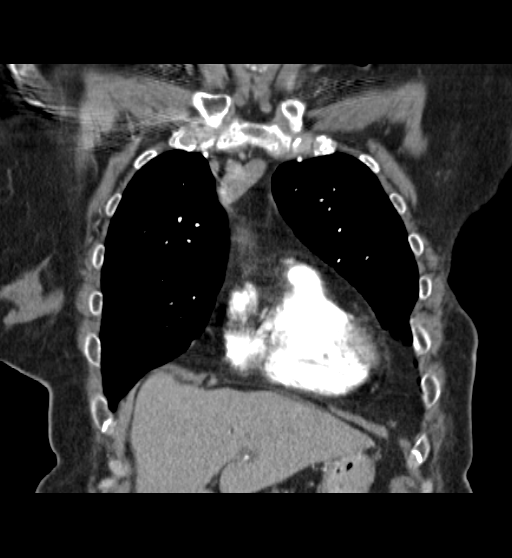
[im 55/110  soft-tissue]
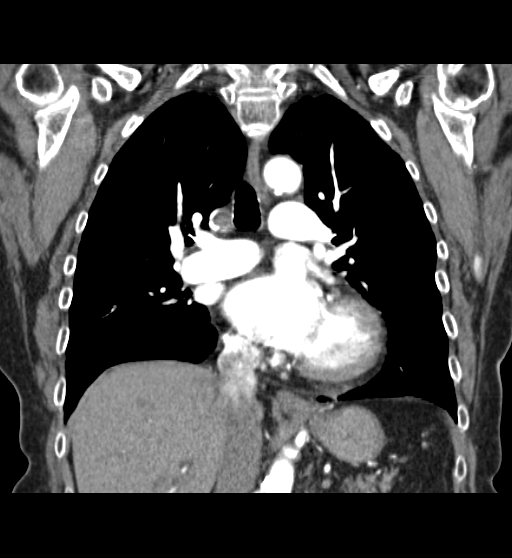
[im 82/110  soft-tissue]
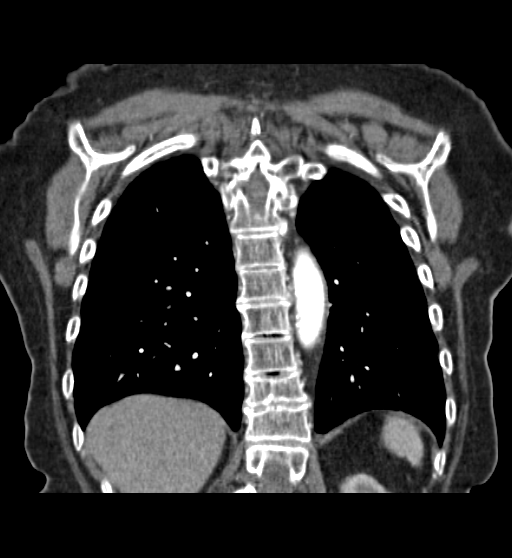

[18 of 46 positions shown; findings below may reference images not displayed]

FINDINGS: Contrast opacification of the pulmonary arteries is excellent. No
filling defects within either main pulmonary artery or their
branches in either lung to suggest pulmonary embolism. Heart
enlarged with left ventricular and left atrial enlargement.
Prominent epicardial fat. No pericardial effusion. Prior apical MI,
with thinning of the wall of the apex at the base, with borderline
early ventricular aneurysm development. Severe 3 vessel coronary
atherosclerosis. Moderate to severe atherosclerosis involving the
thoracic and upper abdominal aorta and their visualized branches
without evidence of aneurysm, dissection or significant stenosis.

Nodule pleuroparenchymal scarring medially in the right lower lobe,
unchanged dating back to 9299. Adjacent 3 mm nodules peripherally in
the deep posterior right lower lobe, not clearly visualized on the
prior examinations. No pulmonary parenchymal nodules or masses
elsewhere. Scattered areas of hyperlucency in both lungs. Central
airways patent with calcified tracheobronchial cartilages.

No significant hilar, mediastinal or axillary lymphadenopathy.
Multiple subcentimeter nodules involving the thyroid gland, not
significantly changed from the prior exams. Prior left mastectomy.

Moderate to marked pancreatic atrophy, unchanged. 1.3 cm nodule
arising from the left adrenal gland, unchanged. Visualized upper
abdomen otherwise unremarkable for the arterial phase of
enhancement. Bone window images demonstrate osseous
demineralization, exaggeration of the usual thoracic kyphosis, prior
compression fractures of T11 and T12.

Review of the MIP images confirms the above findings.
IMPRESSION: 1. No evidence of pulmonary embolism.
2. Cardiomegaly with left ventricular and left atrial enlargement.
3. Prior apical MI with thinning of the apical wall at the base of
the heart. Borderline ventricular aneurysm as a result.
4. Adjacent 3 mm nodules in the deep posterior right lower lobe, not
visualized on prior examinations. Please see below for followup
recommendation.
5. Localized areas of air trapping in both lungs consistent with
asthma and/or bronchitis. No acute cardiopulmonary disease
otherwise.
Follow-up chest CT at 1 year is recommended. This recommendation
follows the consensus statement: Guidelines for Management of Small
Pulmonary Nodules Detected on CT Scans: A Statement from the

## 2016-04-07 ENCOUNTER — Encounter: Payer: Medicare Other | Attending: General Surgery | Admitting: General Surgery

## 2016-04-07 ENCOUNTER — Encounter: Payer: Self-pay | Admitting: General Surgery

## 2016-04-07 DIAGNOSIS — I4891 Unspecified atrial fibrillation: Secondary | ICD-10-CM | POA: Insufficient documentation

## 2016-04-07 DIAGNOSIS — F419 Anxiety disorder, unspecified: Secondary | ICD-10-CM | POA: Insufficient documentation

## 2016-04-07 DIAGNOSIS — I25119 Atherosclerotic heart disease of native coronary artery with unspecified angina pectoris: Secondary | ICD-10-CM | POA: Insufficient documentation

## 2016-04-07 DIAGNOSIS — I252 Old myocardial infarction: Secondary | ICD-10-CM | POA: Insufficient documentation

## 2016-04-07 DIAGNOSIS — I10 Essential (primary) hypertension: Secondary | ICD-10-CM | POA: Diagnosis not present

## 2016-04-07 DIAGNOSIS — M069 Rheumatoid arthritis, unspecified: Secondary | ICD-10-CM | POA: Insufficient documentation

## 2016-04-07 DIAGNOSIS — K219 Gastro-esophageal reflux disease without esophagitis: Secondary | ICD-10-CM | POA: Diagnosis not present

## 2016-04-07 DIAGNOSIS — Z853 Personal history of malignant neoplasm of breast: Secondary | ICD-10-CM | POA: Insufficient documentation

## 2016-04-07 DIAGNOSIS — E785 Hyperlipidemia, unspecified: Secondary | ICD-10-CM | POA: Diagnosis not present

## 2016-04-07 DIAGNOSIS — L0231 Cutaneous abscess of buttock: Secondary | ICD-10-CM | POA: Diagnosis not present

## 2016-04-07 NOTE — Progress Notes (Signed)
See I heal 

## 2016-04-08 NOTE — Progress Notes (Signed)
SELIA, MCELMURRY ([id number]) Visit Report for 04/07/2016 Chief Complaint Document Details Patient Name: Tiano, Jiah A. Date of Service: 04/07/2016 9:30 AM Medical Record Number: [id number] Patient Account Number: [id number] Date of Birth/Sex: 06-24-1928 (82 y.o. Female) Treating RN: Carolyne Fiscal, Debi Primary Care Physician: [place] Pink Other Clinician: Referring Physician: [place] Pink Treating Physician/Extender: Benjaman Pott in Treatment: 0 Information Obtained from: Patient Electronic Signature(s) Signed: 04/07/2016 10:17:43 AM By: Judene Companion MD Entered By: Judene Companion on 04/07/2016 10:17:43 Morency, Saima A. ([id number]) -------------------------------------------------------------------------------- HPI Details Patient Name: Bruun, Janann A. Date of Service: 04/07/2016 9:30 AM Medical Record Number: [id number] Patient Account Number: [id number] Date of Birth/Sex: [birth year] (82 y.o. Female) Treating RN: Carolyne Fiscal, Debi Primary Care Physician: [place] Pink Other Clinician: Referring Physician: [place] Pink Treating Physician/Extender: Judene Companion Weeks in Treatment: 0 Electronic Signature(s) Signed: 04/07/2016 10:17:52 AM By: Judene Companion MD Entered By: Judene Companion on 04/07/2016 10:17:52 Prill, Latondra A. ([id number]) -------------------------------------------------------------------------------- Physical Exam Details Patient Name: Jerger, Ova A. Date of Service: 04/07/2016 9:30 AM Medical Record Number: [id number] Patient Account Number: [id number] Date of Birth/Sex: August 16, 1928 (81 y.o. Female) Treating RN: Carolyne Fiscal, Debi Primary Care Physician: [place] Pink Other Clinician: Referring Physician: [place] Pink Treating Physician/Extender: Benjaman Pott in Treatment: 0 Electronic Signature(s) Signed: 04/07/2016 10:18:02 AM By: Judene Companion MD Entered By: Judene Companion on 04/07/2016 10:18:02 Scali, Tamryn AMarland Kitchen  ([id number]) -------------------------------------------------------------------------------- Physician Orders Details Patient Name: Lins, Dacia A. Date of Service: 04/07/2016 9:30 AM Medical Record Number: [id number] Patient Account Number: [id number] Date of Birth/Sex: May 22, 1928 (82 y.o. Female) Treating RN: Carolyne Fiscal, Debi Primary Care Physician: [place] Pink Other Clinician: Referring Physician: [place] Pink Treating Physician/Extender: Benjaman Pott in Treatment: 0 Verbal / Phone Orders: Yes Clinician: Carolyne Fiscal, Debi Read Back and Verified: Yes Diagnosis Coding Wound Cleansing Wound #1 Left Gluteus o Clean wound with Normal Saline. Anesthetic Wound #1 Left Gluteus o Topical Lidocaine 4% cream applied to wound bed prior to debridement Secondary Dressing o Boardered Foam Dressing - once Medications-please add to medication list. Wound #1 Left Gluteus o P.O. Antibiotics - Doxycycline 100mg  1 po BID 60 pills Consults o General Surgery - Dr. Marlyn Corporal Electronic Signature(s) Signed: 04/07/2016 3:38:49 PM By: Judene Companion MD Signed: 04/07/2016 5:04:00 PM By: Alric Quan Entered By: Alric Quan on 04/07/2016 10:11:33 Rubel, Ashyia A. ([id number]) -------------------------------------------------------------------------------- Problem List Details Patient Name: Rosch, Toy A. Date of Service: 04/07/2016 9:30 AM Medical Record Number: [id number] Patient Account Number: [id number] Date of Birth/Sex: [birth year] (82 y.o. Female) Treating RN: Carolyne Fiscal, Debi Primary Care Physician: [place] Pink Other Clinician: Referring Physician: [place] Pink Treating Physician/Extender: Benjaman Pott in Treatment: 0 Active Problems ICD-10 Encounter Code Description Active Date Diagnosis L02.31 Cutaneous abscess of buttock 04/07/2016 Yes Inactive Problems Resolved Problems Electronic Signature(s) Signed: 04/07/2016 10:25:09 AM By: Judene Companion  MD Previous Signature: 04/07/2016 10:17:33 AM Version By: Judene Companion MD Entered By: Judene Companion on 04/07/2016 10:25:09 Nylen, Adlean A. ([id number]) -------------------------------------------------------------------------------- Progress Note Details Patient Name: Alameda, Syvanna A. Date of Service: 04/07/2016 9:30 AM Medical Record Number: [id number] Patient Account Number: [id number] Date of Birth/Sex: 13-Mar-1928 (82 y.o. Female) Treating RN: Carolyne Fiscal, Debi Primary Care Physician: [place] Pink Other Clinician: Referring Physician: [place] Pink Treating Physician/Extender: Benjaman Pott in Treatment: 0 Subjective Chief Complaint Information obtained from Patient Wound History Patient presents with 1 open wound that has been present for approximately 2 months. Patient has been treating wound in the following manner: hydrophiic wound dressing. Laboratory tests have not been performed in the last month. Patient reportedly has  not tested positive for an antibiotic resistant organism. Patient reportedly has not tested positive for osteomyelitis. Patient reportedly has not had testing performed to evaluate circulation in the legs. Patient experiences the following problems associated with their wounds: swelling. Patient History Information obtained from Patient. Allergies No allergies have been documented for the patient Family History Cancer - Siblings, Heart Disease - Mother, Father, Hypertension - Father, Stroke - Father, No family history of Diabetes, Hereditary Spherocytosis, Kidney Disease, Lung Disease, Seizures, Thyroid Problems, Tuberculosis. Social History Never smoker, Marital Status - Widowed, Alcohol Use - Never, Drug Use - No History, Caffeine Use - Daily. Medical History Eyes Patient has history of Cataracts - had surgery, Glaucoma - hx Cardiovascular Patient has history of Arrhythmia - A-FIB, Hypertension, Myocardial Infarction Musculoskeletal Patient  has history of Rheumatoid Arthritis Medical And Surgical History Notes Cardiovascular Cardiac Cath Oncologic Trentman, Celester A. ([id number]) left breast removed d/t breast cancer Review of Systems (ROS) Eyes Complains or has symptoms of Glasses / Contacts - glasses. Ear/Nose/Mouth/Throat Denies complaints or symptoms of Difficult clearing ears, hearing aides Hematologic/Lymphatic The patient has no complaints or symptoms. Respiratory The patient has no complaints or symptoms. 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 Genitourinary The patient has no complaints or symptoms. Immunological The patient has no complaints or symptoms. Integumentary (Skin) Complains or has symptoms of Wounds. Neurologic The patient has no complaints or symptoms. Psychiatric Complains or has symptoms of Anxiety, Claustrophobia. Objective Constitutional Vitals Time Taken: 9:25 AM, Source: Stated, Weight: 108 lbs, Source: Stated, Temperature: 97.9 F, Pulse: 73 bpm, Respiratory Rate: 18 breaths/min, Blood Pressure: 139/69 mmHg. General Notes: Pt stated that she is 4'8 but it is not an option Integumentary (Hair, Skin) Wound #1 status is Open. Original cause of wound was Gradually Appeared. The wound is located on the Left Gluteus. The wound measures 1cm length x 0.5cm width x 2.7cm depth; 0.393cm^2 area and 1.06cm^3 volume. The wound is limited to skin breakdown. There is no tunneling or undermining noted. There is a large amount of serous drainage noted. The wound margin is flat and intact. There is large (67- 100%) red granulation within the wound bed. There is a small (1-33%) amount of necrotic tissue within the wound bed including Adherent Slough. The periwound skin appearance exhibited: Localized Edema, Moist, Erythema. The surrounding wound skin  color is noted with erythema which is circumferential. Periwound Komperda, Reaghan A. ([id number]) temperature was noted as No Abnormality. The periwound has tenderness on palpation. Assessment Plan Wound Cleansing: Wound #1 Left Gluteus: Clean wound with Normal Saline. Anesthetic: Wound #1 Left Gluteus: Topical Lidocaine 4% cream applied to wound bed prior to debridement Secondary Dressing: Boardered Foam Dressing - once Medications-please add to medication list.: Wound #1 Left Gluteus: P.O. Antibiotics - Doxycycline 100mg  1 po BID 60 pills Consults ordered were: General Surgery - Dr. Marlyn Corporal Follow-Up Appointments: A follow-up appointment should be scheduled. Medication Reconciliation completed and provided to Patient/Care Provider. Patient has abscess left buttucks. On doxycycline. Needs surgical consult. Abcess 4 cm diameter Recent hip replaced. Electronic Signature(s) Signed: 04/07/2016 10:20:13 AM By: Judene Companion MD Entered By: Judene Companion on 04/07/2016 10:20:13 Mauch, Sheneika A. ([id number]) Narayanan, Destanee A. ([id number]) -------------------------------------------------------------------------------- ROS/PFSH Details Patient Name: Moss, Sharlize A. Date of Service: 04/07/2016 9:30 AM Medical Record Number: [id number] Patient Account Number: [id number] Date of Birth/Sex: November 23, 1928 (82 y.o. Female) Treating RN: Carolyne Fiscal, Debi Primary Care Physician: [place] Pink Other Clinician: Referring  Physician: [place] Pink Treating Physician/Extender: Benjaman Pott in Treatment: 0 Information Obtained From Patient Wound History Do you currently have one or more open woundso Yes How many open wounds do you currently haveo 1 Approximately how long have you had your woundso 2 months How have you been treating your wound(s) until nowo hydrophiic wound dressing Has your wound(s) ever healed and then re-openedo No Have you had any lab work done in the past montho No Have  you tested positive for an antibiotic resistant organism (MRSA, No VRE)o Have you tested positive for osteomyelitis (bone infection)o No Have you had any tests for circulation on your legso No Have you had other problems associated with your woundso Swelling Eyes Complaints and Symptoms: Positive for: Glasses / Contacts - glasses Medical History: Positive for: Cataracts - had surgery; Glaucoma - hx Ear/Nose/Mouth/Throat Complaints and Symptoms: Negative for: Difficult clearing ears Review of System Notes: hearing aides Integumentary (Skin) Complaints and Symptoms: Positive for: Wounds Psychiatric Complaints and Symptoms: Positive for: Anxiety; Claustrophobia Hematologic/Lymphatic Carnevale, Cesilia A. 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 ([id number]) Oncologic Medical History: Past Medical History Notes: left breast removed d/t breast cancer HBO Extended History Items Eyes: Eyes: Cataracts Glaucoma Family and Social History Cancer: Yes - Siblings; Diabetes: No; Heart Disease: Yes - Mother, Father; Hereditary Spherocytosis: No; Hypertension: Yes - Father; Kidney Disease: No; Lung  Disease: No; Seizures: No; Stroke: Yes - Father; Thyroid Problems: No; Tuberculosis: No; Never smoker; Marital Status - Widowed; Alcohol Use: Never; Drug Use: No History; Caffeine Use: Daily; Financial Concerns: No; Food, Clothing or Shelter Needs: No; Support System Lacking: No; Transportation Concerns: No; Advanced Directives: No; Patient does not want information on Advanced Directives; Do not resuscitate: No; Living Will: No; Medical Power of Attorney: No Electronic Signature(s) Signed: 04/07/2016 3:38:49 PM By: Judene Companion MD Signed: 04/07/2016 5:04:00 PM By: Alric Quan Entered By: Alric Quan on 04/07/2016 09:33:35 Brod, Marley A. ([id number]) -------------------------------------------------------------------------------- SuperBill Details Patient Name: Quashie, Jalyric A. Date of Service: 04/07/2016 Medical Record Number: [id number] Patient Account Number: [id number] Date of Birth/Sex: [birth year] (82 y.o. Female) Treating RN: Carolyne Fiscal, Debi Primary Care Physician: [place] Pink Other Clinician: Referring Physician: [place] Pink Treating Physician/Extender: Benjaman Pott in Treatment: 0 Diagnosis Coding ICD-10 Codes Code Description L02.31 Cutaneous abscess of buttock Facility Procedures CPT4 Code: [id number] Description: 99214 - WOUND CARE VISIT-LEV 4 EST PT Modifier: Quantity: 1 Physician Procedures CPT4 Code: [id number] Description: [id number] - WC PHYS LEVEL 2 - NEW PT ICD-10 Description Diagnosis L02.31 Cutaneous abscess of buttock Modifier: Quantity: 1 Electronic Signature(s) Signed: 04/07/2016 10:26:03 AM By: Judene Companion MD Entered By: Judene Companion on 04/07/2016 10:26:03

## 2016-04-08 NOTE — Progress Notes (Signed)
Peggy, [name] ([id number]) Visit Report for 04/07/2016 Allergy List Details Patient Name: [name], [name] Date of Service: 04/07/2016 9:30 AM Medical Record Number: [id number] Patient Account Number: [id number] Date of Birth/Sex: 19-Oct-1928 (82 y.o. Female) Treating RN: Carolyne Fiscal, Debi Primary Care Physician: Maryland Pink Other Clinician: Referring Physician: [place] Pink Treating Physician/Extender: Judene Companion Weeks in Treatment: 0 Electronic Signature(s) Signed: 04/07/2016 5:04:00 PM By: Alric Quan Entered By: Alric Quan on 04/07/2016 09:46:19 [name], [name] ([id number]) -------------------------------------------------------------------------------- Arrival Information Details Patient Name: [name], [name] Date of Service: 04/07/2016 9:30 AM Medical Record Number: [id number] Patient Account Number: [id number] Date of Birth/Sex: 07/25/1928 (82 y.o. Female) Treating RN: Carolyne Fiscal, Debi Primary Care Physician: Maryland Pink Other Clinician: Referring Physician: [place] Pink Treating Physician/Extender: Benjaman Pott in Treatment: 0 Visit Information Patient Arrived: Peggy [name] Time: 09:23 Accompanied By: daughter Transfer Assistance: EasyPivot Patient Lift Patient Identification Verified: Yes Secondary Verification Process Yes Completed: Patient Requires Transmission- No Based Precautions: Patient Has Alerts: Yes Patient Alerts: Patient on Blood Thinner Warfarin Electronic Signature(s) Signed: 04/07/2016 5:04:00 PM By: Alric Quan Entered By: Alric Quan on 04/07/2016 09:25:06 [name], [name] ([id number]) -------------------------------------------------------------------------------- Clinic Level of Care Assessment Details Patient Name: [name], [name] Date of Service: 04/07/2016 9:30 AM Medical Record Number: [id number] Patient Account Number: [id number] Date of Birth/Sex: 1928/03/30 (82 y.o. Female) Treating RN:  Carolyne Fiscal, Debi Primary Care Physician: Maryland Pink Other Clinician: Referring Physician: [place] Pink Treating Physician/Extender: Benjaman Pott in Treatment: 0 Clinic Level of Care Assessment Items TOOL 2 Quantity Score X - Use when only an EandM is performed on the INITIAL visit 1 0 ASSESSMENTS - Nursing Assessment / Reassessment X - General Physical Exam (combine w/ comprehensive assessment (listed just 1 20 below) when performed on new pt. evals) X - Comprehensive Assessment (HX, ROS, Risk Assessments, Wounds Hx, etc.) 1 25 ASSESSMENTS - Wound and Skin Assessment / Reassessment X - Simple Wound Assessment / Reassessment - one wound 1 5 []  - Complex Wound Assessment / Reassessment - multiple wounds 0 []  - Dermatologic / Skin Assessment (not related to wound area) 0 ASSESSMENTS - Ostomy and/or Continence Assessment and Care []  - Incontinence Assessment and Management 0 []  - Ostomy Care Assessment and Management (repouching, etc.) 0 PROCESS - Coordination of Care []  - Simple Patient / Family Education for ongoing care 0 X - Complex (extensive) Patient / Family Education for ongoing care 1 20 X - Staff obtains Programmer, systems, Records, Test Results / Process Orders 1 10 []  - Staff telephones HHA, Nursing Homes / Clarify orders / etc 0 []  - Routine Transfer to another Facility (non-emergent condition) 0 []  - Routine Hospital Admission (non-emergent condition) 0 X - New Admissions / Biomedical engineer / Ordering NPWT, Apligraf, etc. 1 15 []  - Emergency Hospital Admission (emergent condition) 0 X - Simple Discharge Coordination 1 10 [name], [name] ([id number]) []  - Complex (extensive) Discharge Coordination 0 PROCESS - Special Needs []  - Pediatric / Minor Patient Management 0 []  - Isolation Patient Management 0 []  - Hearing / Language / Visual special needs 0 []  - Assessment of Community assistance (transportation, D/C planning, etc.) 0 []  - Additional assistance /  Altered mentation 0 []  - Support Surface(s) Assessment (bed, cushion, seat, etc.) 0 INTERVENTIONS - Wound Cleansing / Measurement X - Wound Imaging (photographs - any number of wounds) 1 5 []  - Wound Tracing (instead of photographs) 0 X - Simple Wound Measurement - one wound 1 5 []  - Complex Wound Measurement - multiple wounds 0  X - Simple Wound Cleansing - one wound 1 5 []  - Complex Wound Cleansing - multiple wounds 0 INTERVENTIONS - Wound Dressings []  - Small Wound Dressing one or multiple wounds 0 X - Medium Wound Dressing one or multiple wounds 1 15 []  - Large Wound Dressing one or multiple wounds 0 []  - Application of Medications - injection 0 INTERVENTIONS - Miscellaneous []  - External ear exam 0 []  - Specimen Collection (cultures, biopsies, blood, body fluids, etc.) 0 []  - Specimen(s) / Culture(s) sent or taken to Lab for analysis 0 []  - Patient Transfer (multiple staff / Harrel Lemon Lift / Similar devices) 0 []  - Simple Staple / Suture removal (25 or less) 0 []  - Complex Staple / Suture removal (26 or more) 0 [name], [name] ([id number]) []  - Hypo / Hyperglycemic Management (close monitor of Blood Glucose) 0 []  - Ankle / Brachial Index (ABI) - do not check if billed separately 0 Has the patient been seen at the hospital within the last three years: Yes Total Score: 135 Level Of Care: New/Established - Level 4 Electronic Signature(s) Signed: 04/07/2016 5:04:00 PM By: Alric Quan Entered By: Alric Quan on 04/07/2016 10:23:25 [name], [name] ([id number]) -------------------------------------------------------------------------------- Encounter Discharge Information Details Patient Name: [name], [name] Date of Service: 04/07/2016 9:30 AM Medical Record Number: [id number] Patient Account Number: [id number] Date of Birth/Sex: 07-Dec-1928 (82 y.o. Female) Treating RN: Carolyne Fiscal, Debi Primary Care Physician: Maryland Pink Other Clinician: Referring Physician: Maryland Pink Treating Physician/Extender: Benjaman Pott in Treatment: 0 Encounter Discharge Information Items Schedule Follow-up Appointment: No Medication Reconciliation completed No and provided to Patient/Care Aiken Withem: Provided on Clinical Summary of Care: 04/07/2016 Form Type Recipient Paper Patient BS Electronic Signature(s) Signed: 04/07/2016 10:26:45 AM By: Judene Companion MD Previous Signature: 04/07/2016 10:12:38 AM Version By: Ruthine Dose Entered By: Judene Companion on 04/07/2016 10:26:45 Gharibian, Damyah [name]. ([id number]) -------------------------------------------------------------------------------- Lower Extremity Assessment Details Patient Name: [name], [name] Date of Service: 04/07/2016 9:30 AM Medical Record Number: [id number] Patient Account Number: [id number] Date of Birth/Sex: [birth year] (82 y.o. Female) Treating RN: Carolyne Fiscal, Debi Primary Care Physician: Maryland Pink Other Clinician: Referring Physician: Maryland Pink Treating Physician/Extender: Judene Companion Weeks in Treatment: 0 Electronic Signature(s) Signed: 04/07/2016 5:04:00 PM By: Alric Quan Entered By: Alric Quan on 04/07/2016 09:28:41 Bogart, Anesha [name]. ([id number]) -------------------------------------------------------------------------------- Multi Wound Chart Details Patient Name: [name], [name] Date of Service: 04/07/2016 9:30 AM Medical Record Number: [id number] Patient Account Number: [id number] Date of Birth/Sex: 03-May-1928 (82 y.o. Female) Treating RN: Carolyne Fiscal, Debi Primary Care Physician: Maryland Pink Other Clinician: Referring Physician: Maryland Pink Treating Physician/Extender: Benjaman Pott in Treatment: 0 Vital Signs Height(in): Pulse(bpm): 73 Weight(lbs): 108 Blood Pressure 139/69 (mmHg): Body Mass Index(BMI): Temperature(F): 97.9 Respiratory Rate 18 (breaths/min): Photos: [1:No Photos] [N/[name]:N/[name]] Wound Location: [1:Left Gluteus]  [N/[name]:N/[name]] Wounding Event: [1:Gradually Appeared] [N/[name]:N/[name]] Primary Etiology: [1:Abscess] [N/[name]:N/[name]] Comorbid History: [1:Cataracts, Glaucoma, Arrhythmia, Hypertension, Myocardial Infarction, Rheumatoid Arthritis] [N/[name]:N/[name]] Date Acquired: [1:02/08/2016] [N/[name]:N/[name]] Weeks of Treatment: [1:0] [N/[name]:N/[name]] Wound Status: [1:Open] [N/[name]:N/[name]] Measurements L x W x D 1x0.5x2.7 [N/[name]:N/[name]] (cm) Area (cm) : [1:0.393] [N/[name]:N/[name]] Volume (cm) : [1:1.06] [N/[name]:N/[name]] Classification: [1:Partial Thickness] [N/[name]:N/[name]] Exudate Amount: [1:Large] [N/[name]:N/[name]] Exudate Type: [1:Serous] [N/[name]:N/[name]] Exudate Color: [1:amber] [N/[name]:N/[name]] Wound Margin: [1:Flat and Intact] [N/[name]:N/[name]] Granulation Amount: [1:Large (67-100%)] [N/[name]:N/[name]] Granulation Quality: [1:Red] [N/[name]:N/[name]] Necrotic Amount: [1:Small (1-33%)] [N/[name]:N/[name]] Exposed Structures: [1:Fascia: No Fat: No Tendon: No Muscle: No Joint: No Bone: No] [N/[name]:N/[name]] Limited to Skin Breakdown Epithelialization: None N/[name] N/[name] Periwound Skin Texture: Edema: Yes N/[name] N/[name] Periwound Skin Moist: Yes  N/[name] N/[name] Moisture: Periwound Skin Color: Erythema: Yes N/[name] N/[name] Erythema Location: Circumferential N/[name] N/[name] Temperature: No Abnormality N/[name] N/[name] Tenderness on Yes N/[name] N/[name] Palpation: Wound Preparation: Ulcer Cleansing: N/[name] N/[name] Rinsed/Irrigated with Saline Topical Anesthetic Applied: Other: lidocaine 4% Treatment Notes Electronic Signature(s) Signed: 04/07/2016 5:04:00 PM By: Alric Quan Entered By: Alric Quan on 04/07/2016 09:49:59 Janusz, Orel [name]. ([id number]) -------------------------------------------------------------------------------- [place] Details Patient Name: [name], [name] Date of Service: 04/07/2016 9:30 AM Medical Record Number: [id number] Patient Account Number: [id number] Date of Birth/Sex: December 07, 1928 (82 y.o. Female) Treating RN: Carolyne Fiscal, Debi Primary Care Physician: Maryland Pink Other Clinician: Referring Physician: Maryland Pink Treating Physician/Extender: Benjaman Pott in Treatment: 0 Active Inactive Abuse / Safety / Falls / Self Care Management Nursing Diagnoses: Potential for falls Goals: Patient will remain injury free Date Initiated: 04/07/2016 Goal Status: Active Interventions: Assess fall risk on admission and as needed Notes: Nutrition Nursing Diagnoses: Imbalanced nutrition Goals: Patient/caregiver agrees to and verbalizes understanding of need to use nutritional supplements and/or vitamins as prescribed Date Initiated: 04/07/2016 Goal Status: Active Interventions: Assess patient nutrition upon admission and as needed per policy Notes: Orientation to the Wound Care Program Nursing Diagnoses: Knowledge deficit related to the wound healing center program Goals: Patient/caregiver will verbalize understanding of the [place], [place]. ([id number]) Date Initiated: 04/07/2016 Goal Status: Active Interventions: Provide education on orientation to the wound center Notes: Pain, Acute or Chronic Nursing Diagnoses: Pain, acute or chronic: actual or potential Potential alteration in comfort, pain Goals: Patient will verbalize adequate pain control and receive pain control interventions during procedures as needed Date Initiated: 04/07/2016 Goal Status: Active Patient/caregiver will verbalize adequate pain control between visits Date Initiated: 04/07/2016 Goal Status: Active Interventions: Assess comfort goal upon admission Complete pain assessment as per visit requirements Notes: Soft Tissue Infection Nursing Diagnoses: Impaired tissue integrity Knowledge deficit related to disease process and management Goals: Patient's soft tissue infection will resolve Date Initiated: 04/07/2016 Goal Status: Active Interventions: Assess signs and symptoms of infection every visit Notes: Wound/Skin Impairment Nursing Diagnoses: [name], [name]  ([id number]) Impaired tissue integrity Goals: Ulcer/skin breakdown will have [name] volume reduction of 30% by week 4 Date Initiated: 04/07/2016 Goal Status: Active Ulcer/skin breakdown will have [name] volume reduction of 50% by week 8 Date Initiated: 04/07/2016 Goal Status: Active Ulcer/skin breakdown will have [name] volume reduction of 80% by week 12 Date Initiated: 04/07/2016 Goal Status: Active Interventions: Assess ulceration(s) every visit Notes: Electronic Signature(s) Signed: 04/07/2016 5:04:00 PM By: Alric Quan Entered By: Alric Quan on 04/07/2016 10:24:52 [name], [place]. ([id number]) -------------------------------------------------------------------------------- Pain Assessment Details Patient Name: [name], [name] Date of Service: 04/07/2016 9:30 AM Medical Record Number: [id number] Patient Account Number: [id number] Date of Birth/Sex: 03-08-1928 (82 y.o. Female) Treating RN: Carolyne Fiscal, Debi Primary Care Physician: Maryland Pink Other Clinician: Referring Physician: [place] Pink Treating Physician/Extender: Benjaman Pott in Treatment: 0 Active Problems Location of Pain Severity and Description of Pain Patient Has Paino Yes Site Locations Pain Location: Pain in Ulcers Rate the pain. Current Pain Level: 5 Character of Pain Describe the Pain: Aching Pain Management and Medication Current Pain Management: Electronic Signature(s) Signed: 04/07/2016 5:04:00 PM By: Alric Quan Entered By: Alric Quan on 04/07/2016 09:25:37 Doepke, [name] ([id number]) -------------------------------------------------------------------------------- Patient/Caregiver Education Details Patient Name: [place], Special [name]. Date of Service: 04/07/2016 9:30 AM Medical Record Number: [id number] Patient Account Number: [id number] Date of Birth/Gender: 12-19-1928 (82 y.o. Female) Treating RN: Carolyne Fiscal, Debi Primary Care Physician: [place] Pink Other Clinician:  Referring  Physician: [place] Pink Treating Physician/Extender: Benjaman Pott in Treatment: 0 Education Assessment Education Provided To: Patient Education Topics Provided Wound/Skin Impairment: Handouts: Other: go to see surgeon Methods: Demonstration, Explain/Verbal Responses: State content correctly Electronic Signature(s) Signed: 04/07/2016 10:26:52 AM By: Judene Companion MD Entered By: Judene Companion on 04/07/2016 10:26:51 Sisemore, Rosielee [name]. ([id number]) -------------------------------------------------------------------------------- Wound Assessment Details Patient Name: Agredano, Mckensi [name]. Date of Service: 04/07/2016 9:30 AM Medical Record Number: [id number] Patient Account Number: [id number] Date of Birth/Sex: 1928/02/21 (82 y.o. Female) Treating RN: Carolyne Fiscal, Debi Primary Care Physician: Maryland Pink Other Clinician: Referring Physician: [place] Pink Treating Physician/Extender: Benjaman Pott in Treatment: 0 Wound Status Wound Number: 1 Primary Abscess Etiology: Wound Location: Left Gluteus Wound Open Wounding Event: Gradually Appeared Status: Date Acquired: 02/08/2016 Comorbid Cataracts, Glaucoma, Arrhythmia, Weeks Of Treatment: 0 History: Hypertension, Myocardial Infarction, Clustered Wound: No Rheumatoid Arthritis Photos Photo Uploaded By: Alric Quan on 04/07/2016 11:25:51 Wound Measurements Length: (cm) 1 Width: (cm) 0.5 Depth: (cm) 2.7 Area: (cm) 0.393 Volume: (cm) 1.06 % Reduction in Area: % Reduction in Volume: Epithelialization: None Tunneling: No Undermining: No Wound Description Classification: Partial Thickness Wound Margin: Flat and Intact Exudate Amount: Large Exudate Type: Serous Exudate Color: amber Foul Odor After Cleansing: No Wound Bed Granulation Amount: Large (67-100%) Exposed Structure Granulation Quality: Red Fascia Exposed: No Necrotic Amount: Small (1-33%) Fat Layer Exposed: No Necrotic Quality: Adherent  Slough Tendon Exposed: No [place], Dylanie [name]. ([id number]) Muscle Exposed: No Joint Exposed: No Bone Exposed: No Limited to Skin Breakdown Periwound Skin Texture Texture Color No Abnormalities Noted: No No Abnormalities Noted: No Localized Edema: Yes Erythema: Yes Erythema Location: Circumferential Moisture No Abnormalities Noted: No Temperature / Pain Moist: Yes Temperature: No Abnormality Tenderness on Palpation: Yes Wound Preparation Ulcer Cleansing: Rinsed/Irrigated with Saline Topical Anesthetic Applied: Other: lidocaine 4%, Treatment Notes Wound #1 (Left Gluteus) 1. Cleansed with: Clean wound with Normal Saline 2. Anesthetic Topical Lidocaine 4% cream to wound bed prior to debridement 3. Peri-wound Care: Skin Prep 5. Secondary Dressing Applied Bordered Foam Dressing Electronic Signature(s) Signed: 04/07/2016 5:04:00 PM By: Alric Quan Entered By: Alric Quan on 04/07/2016 09:46:10 Saner, Kiyani [name]. ([id number]) -------------------------------------------------------------------------------- Vitals Details Patient Name: Larocque, Maybree [name]. Date of Service: 04/07/2016 9:30 AM Medical Record Number: [id number] Patient Account Number: [id number] Date of Birth/Sex: [birth year] (82 y.o. Female) Treating RN: Carolyne Fiscal, Debi Primary Care Physician: Maryland Pink Other Clinician: Referring Physician: [place] Pink Treating Physician/Extender: Benjaman Pott in Treatment: 0 Vital Signs Time Taken: 09:25 Temperature (F): 97.9 Source: Stated Pulse (bpm): 73 Weight (lbs): 108 Respiratory Rate (breaths/min): 18 Source: Stated Blood Pressure (mmHg): 139/69 Reference Range: 80 - 120 mg / dl Notes Pt stated that she is 4'8 but it is not an option Electronic Signature(s) Signed: 04/07/2016 5:04:00 PM By: Alric Quan Entered By: Alric Quan on 04/07/2016 09:28:01

## 2016-04-08 NOTE — Progress Notes (Signed)
Lanpher, Matricia A. ([id number]) Visit Report for 04/07/2016 Abuse/Suicide Risk Screen Details Patient Name: Peggy [name], Peggy A. Date of Service: 04/07/2016 9:30 AM Medical Record Number: [id number] Patient Account Number: [id number] Date of Birth/Sex: 1928-09-20 (82 y.o. Female) Treating RN: Ahmed Prima Primary Care Physician: Maryland Pink Other Clinician: Referring Physician: [place] Pink Treating Physician/Extender: Benjaman Pott in Treatment: 0 Abuse/Suicide Risk Screen Items Answer ABUSE/SUICIDE RISK SCREEN: Has anyone close to you tried to hurt or harm you recentlyo No Do you feel uncomfortable with anyone in your familyo No Has anyone forced you do things that you didnot want to doo No Do you have any thoughts of harming yourselfo No Patient displays signs or symptoms of abuse and/or neglect. No Electronic Signature(s) Signed: 04/07/2016 5:04:00 PM By: Alric Quan Entered By: Alric Quan on 04/07/2016 09:33:42 Peggy [name], [place]. ([id number]) -------------------------------------------------------------------------------- Activities of Daily Living Details Patient Name: Peggy [name], Peggy A. Date of Service: 04/07/2016 9:30 AM Medical Record Number: [id number] Patient Account Number: [id number] Date of Birth/Sex: January 07, 1928 (82 y.o. Female) Treating RN: Carolyne Fiscal, Debi Primary Care Physician: Maryland Pink Other Clinician: Referring Physician: [place] Pink Treating Physician/Extender: Benjaman Pott in Treatment: 0 Activities of Daily Living Items Answer Activities of Daily Living (Please select one for each item) Drive Automobile Completely Able Take Medications Completely Able Use Telephone Completely Able Care for Appearance Completely Able Use Toilet Completely Able Bath / Shower Completely Able Dress Self Completely Able Feed Self Completely Able Walk Need Assistance Get In / Out Bed Completely Able Housework Completely Able Prepare Meals  Completely [place] for Self Completely Able Electronic Signature(s) Signed: 04/07/2016 5:04:00 PM By: Alric Quan Entered By: Alric Quan on 04/07/2016 09:34:13 Peggy [name], Peggy A. ([id number]) -------------------------------------------------------------------------------- Education Assessment Details Patient Name: Peggy [name], Peggy A. Date of Service: 04/07/2016 9:30 AM Medical Record Number: [id number] Patient Account Number: [id number] Date of Birth/Sex: [birth year] (82 y.o. Female) Treating RN: Carolyne Fiscal, Debi Primary Care Physician: Maryland Pink Other Clinician: Referring Physician: [place] Pink Treating Physician/Extender: Benjaman Pott in Treatment: 0 Primary Learner Assessed: Patient Learning Preferences/Education Level/Primary Language Learning Preference: Explanation Highest Education Level: High School Preferred Language: English Cognitive Barrier Assessment/Beliefs Language Barrier: No Translator Needed: No Memory Deficit: No Emotional Barrier: No Cultural/Religious Beliefs Affecting Medical No Care: Physical Barrier Assessment Impaired Vision: Yes Glasses Impaired Hearing: No Decreased Hand dexterity: No Knowledge/Comprehension Assessment Knowledge Level: High Comprehension Level: High Ability to understand written High instructions: Ability to understand verbal High instructions: Motivation Assessment Anxiety Level: Calm Cooperation: Cooperative Education Importance: Acknowledges Need Interest in Health Problems: Asks Questions Perception: Coherent Willingness to Engage in Self- High Management Activities: Readiness to Engage in Self- High Management Activities: Electronic Signature(s) Peggy [name], Peggy A. ([id number]) Signed: 04/07/2016 5:04:00 PM By: Alric Quan Entered By: Alric Quan on 04/07/2016 09:34:44 Peggy [name], Peggy A.  ([id number]) -------------------------------------------------------------------------------- Fall Risk Assessment Details Patient Name: Peggy [name], Peggy A. Date of Service: 04/07/2016 9:30 AM Medical Record Number: [id number] Patient Account Number: [id number] Date of Birth/Sex: Nov 19, 1928 (82 y.o. Female) Treating RN: Carolyne Fiscal, Debi Primary Care Physician: Maryland Pink Other Clinician: Referring Physician: [place] Pink Treating Physician/Extender: Benjaman Pott in Treatment: 0 Fall Risk Assessment Items Have you had 2 or more falls in the last 12 monthso 0 No Have you had any fall that resulted in injury in the last 12 monthso 0 No FALL RISK ASSESSMENT: History of falling - immediate or within 3 months 0 No Secondary diagnosis 15 Yes Ambulatory aid None/bed rest/wheelchair/nurse 0 No Crutches/cane/walker 15 Yes Furniture 0  No IV Access/Saline Lock 0 No Gait/Training Normal/bed rest/immobile 0 No Weak 0 No Impaired 0 No Mental Status Oriented to own ability 0 Yes Electronic Signature(s) Signed: 04/07/2016 5:04:00 PM By: Alric Quan Entered By: Alric Quan on 04/07/2016 09:35:16 Peggy [name], Peggy A. ([id number]) -------------------------------------------------------------------------------- Nutrition Risk Assessment Details Patient Name: Peggy [name], Peggy A. Date of Service: 04/07/2016 9:30 AM Medical Record Number: [id number] Patient Account Number: [id number] Date of Birth/Sex: [birth year] (82 y.o. Female) Treating RN: Carolyne Fiscal, Debi Primary Care Physician: Maryland Pink Other Clinician: Referring Physician: [place] Pink Treating Physician/Extender: Benjaman Pott in Treatment: 0 Height (in): Weight (lbs): 108 Body Mass Index (BMI): Nutrition Risk Assessment Items NUTRITION RISK SCREEN: I have an illness or condition that made me change the kind and/or 2 Yes amount of food I eat I eat fewer than two meals per day 0 No I eat few fruits and  vegetables, or milk products 0 No I have three or more drinks of beer, liquor or wine almost every day 0 No I have tooth or mouth problems that make it hard for me to eat 0 No I don't always have enough money to buy the food I need 0 No I eat alone most of the time 1 Yes I take three or more different prescribed or over-the-counter drugs a 1 Yes day Without wanting to, I have lost or gained 10 pounds in the last six 2 Yes months I am not always physically able to shop, cook and/or feed myself 2 Yes Nutrition Protocols Good Risk Protocol Moderate Risk Protocol Electronic Signature(s) Signed: 04/07/2016 5:04:00 PM By: Alric Quan Entered By: Alric Quan on 04/07/2016 09:35:44

## 2016-04-14 ENCOUNTER — Encounter: Payer: Self-pay | Admitting: General Surgery

## 2016-04-14 ENCOUNTER — Ambulatory Visit (INDEPENDENT_AMBULATORY_CARE_PROVIDER_SITE_OTHER): Payer: Medicare Other | Admitting: General Surgery

## 2016-04-14 VITALS — BP 130/72 | HR 89 | Resp 16 | Ht <= 58 in | Wt 107.4 lb

## 2016-04-14 DIAGNOSIS — R229 Localized swelling, mass and lump, unspecified: Secondary | ICD-10-CM | POA: Diagnosis not present

## 2016-04-14 NOTE — Patient Instructions (Addendum)
You may discontinue your antibiotics   Excision of Skin Lesions Excision of a skin lesion refers to the removal of a section of skin by making small cuts (incisions) in the skin. This procedure may be done to remove a cancerous (malignant) or noncancerous (benign) growth on the skin. It is typically done to treat or prevent cancer or infection. It may also be done to improve cosmetic appearance. The procedure may be done to remove:  Cancerous growths, such as basal cell carcinoma, squamous cell carcinoma, or melanoma.  Noncancerous growths, such as a cyst or lipoma.  Growths, such as moles or skin tags, which may be removed for cosmetic reasons. Various excision or surgical techniques may be used depending on your condition, the location of the lesion, and your overall health. LET [facility] CARE PROVIDER KNOW ABOUT:  Any allergies you have.  All medicines you are taking, including vitamins, herbs, eye drops, creams, and over-the-counter medicines.  Previous problems you or members of your family have had with the use of anesthetics.  Any blood disorders you have.  Previous surgeries you have had.  Any medical conditions you have.  Whether you are pregnant or may be pregnant. RISKS AND COMPLICATIONS Generally, this is a safe procedure. However, problems may occur, including:  Bleeding.  Infection.  Scarring.  Recurrence of the cyst, lipoma, or cancer.  Changes in skin sensation or appearance, such as discoloration or swelling.  Reaction to the anesthetics.  Allergic reaction to surgical materials or ointments.  Damage to nerves, blood vessels, muscles, or other structures.  Continued pain. BEFORE THE PROCEDURE  Ask your health care provider about:  Changing or stopping your regular medicines. This is especially important if you are taking diabetes medicines or blood thinners.  Taking medicines such as aspirin and ibuprofen. These medicines can thin your blood.  Do not take these medicines before your procedure if your health care provider instructs you not to.  You may be asked to take certain medicines.  You may be asked to stop smoking.  You may have an exam or testing.  Plan to have someone take you home after the procedure.  Plan to have someone help you with activities during recovery. PROCEDURE  To reduce your risk of infection:  Your health care team will wash or sanitize their hands.  Your skin will be washed with soap.  You will be given a medicine to numb the area (local anesthetic).  One of the following excision techniques will be performed.  At the end of any of these procedures, antibiotic ointment will be applied as needed. Each of the following techniques may vary among health care providers and hospitals. Complete Surgical Excision The area of skin that needs to be removed will be marked with a pen. Using a small scalpel or scissors, the surgeon will gently cut around and under the lesion until it is completely removed. The lesion will be placed in a fluid and sent to the lab for examination. If necessary, bleeding will be controlled with a device that delivers heat (electrocautery). The edges of the wound may be stitched (sutured) together, and a bandage (dressing) will be applied. This procedure may be performed to treat a cancerous growth or a noncancerous cyst or lesion. Excision of a Cyst The surgeon will make an incision on the cyst. The entire cyst will be removed through the incision. The incision may be closed with sutures. Shave Excision During shave excision, the surgeon will use a small blade  or an electrically heated loop instrument to shave off the lesion. This may be done to remove a mole or a skin tag. The wound will usually be left to heal on its own without sutures. Punch Excision During punch excision, the surgeon will use a small tool that is like a cookie cutter or a hole punch to cut a circle shape out  of the skin. The outer edges of the skin will be sutured together. This may be done to remove a mole or a scar or to perform a biopsy of the lesion. Mohs Micrographic Surgery During Mohs micrographic surgery, layers of the lesion will be removed with a scalpel or a loop instrument and will be examined right away under a microscope. Layers will be removed until all of the abnormal or cancerous tissue has been removed. This procedure is minimally invasive, and it ensures the best cosmetic outcome. It involves the removal of as little normal tissue as possible. Mohs is usually done to treat skin cancer, such as basal cell carcinoma or squamous cell carcinoma, particularly on the face and ears. Depending on the size of the surgical wound, it may be sutured closed. AFTER THE PROCEDURE  Return to your normal activities as told by your health care provider.  Talk with your health care provider to discuss any test results, treatment options, and if necessary, the need for more tests.   This information is not intended to replace advice given to you by your health care provider. Make sure you discuss any questions you have with your health care provider.   Document Released: 03/04/2010 Document Revised: 08/29/2015 Document Reviewed: 01/24/2015 Elsevier Interactive Patient Education [employer].

## 2016-04-14 NOTE — Progress Notes (Addendum)
Patient ID: Peggy [name], female   DOB: [birth year], 82 y.o.   MRN: [id number]  Chief Complaint  Patient presents with  . Other    left gulteal abscess    HPI Peggy [name] is a 82 y.o. female here for evaluation of a gluteal abscess. This was first noticed about several months ago. It felt like a hard area. It started hurting just prior to her surgery for her right hip about 6 weeks ago. She is currently taking antibiotics for this. She recently had a right hip replacement done. She had been seen by the wound clinic and referred for consideration of surgical extirpation.  Patient is company by her daughter who was present for the interview and exam. The patient had not notified Dr. Marry Guan of the lesion prior to her recent surgery. She reports she has had no pain in her hips and surgical intervention.  I personally reviewed the patient's history. HPI  Past Medical History  Diagnosis Date  . Essential hypertension, benign   . Arthritis   . Heart disease 2001  . Hypertension 1996  . Stroke [facility]) 2009  . Unspecified essential hypertension   . Hearing disorder 2012    hearing aids  . Reflux     acid reflux  . Breast screening, unspecified   . Lump or mass in breast   . Special screening for malignant neoplasms, colon   . Other benign neoplasm of connective and other soft tissue of thorax   . Personal history of malignant neoplasm of breast   . Atrial fibrillation ([place])   . Allergy   . Atrial fibrillation ([place])   . Atrial fibrillation with rapid ventricular response [facility]) November 2015    Heart rate up to 140 requiring IV Cardizem  . Breast cancer [facility]) 2012    Patient underwent a left mastectomy on 03-31-11. Pathology showed DCIS at both sites without an invasive component. Sentinel nodes were negative. The breast lesions were noted to be ER +40%, PR-positive, 5%.   . Malignant neoplasm of upper-inner quadrant of female breast ([place])   . Malignant neoplasm of upper-outer quadrant  of female breast ([place])   . Hip pain   . Coronary artery disease   . Heart attack ([place]) 2001  . Anxiety   . GERD (gastroesophageal reflux disease)   . Diverticulosis   . Hyperlipidemia     Past Surgical History  Procedure Laterality Date  . Mastectomy  2012    left breast  . Breast biopsy      left breast biopsy over 15 years ago   . Upper gi endoscopy  2009    [facility] Dr. Donnella Sham  . Colonoscopy  2009    [facility] Dr. Donnella Sham  . Appendectomy      age 13  . Tonsillectomy      as a child  . Cholecystectomy      40 years ago   . Abdominal hysterectomy      age 77  . Small intestine surgery  12/31/2012    Abdominal exploration, lysis of adhesions for distal small bowel obstruction. Rochel Brome, MD  . Eye surgery Bilateral     Cataract Extraction with IOL  . Hernia repair    . Colon surgery      Dr. Tamala Julian, [facility]  . Total hip arthroplasty Right 02/27/2016    Procedure: TOTAL HIP ARTHROPLASTY;  Surgeon: Dereck Leep, MD;  Location: [facility] ORS;  Service: Orthopedics;  Laterality: Right;    Family History  Problem Relation Age of Onset  . Colon cancer Neg Hx   . Ovarian cancer Neg Hx   . Breast cancer Sister     x 2, in their 61's  . Lung cancer Brother   . Stroke Father   . Heart disease Father   . Heart disease Mother     Social History Social History  Substance Use Topics  . Smoking status: Never Smoker   . Smokeless tobacco: Never Used  . Alcohol Use: No    Allergies  Allergen Reactions  . Alendronate Sodium     REACTION: questionable  . Amoxicillin-Pot Clavulanate   . Etodolac   . Ibandronate Sodium     REACTION: questionable  . Iodine     Other reaction(s): Unknown  . Lactose Intolerance (Gi) Other (See Comments)    GI problems  . Minocycline Hcl   . Risedronate Sodium   . Sulfonamide Derivatives Other (See Comments)    Stomach pain  . Zoledronic Acid     REACTION: "dental problems"    Current Outpatient Prescriptions  Medication Sig Dispense Refill   . acetaminophen (TYLENOL) 325 MG tablet Take 325 mg by mouth every 6 (six) hours as needed for mild pain.     Marland Kitchen ALPRAZolam (XANAX) 0.25 MG tablet Take 0.25 mg by mouth at bedtime as needed for anxiety or sleep.     Marland Kitchen aspirin EC 81 MG tablet Take 81 mg by mouth daily.    . Calcium Carbonate-Vitamin D (RA CALCIUM PLUS VITAMIN D) 600-400 MG-UNIT tablet Take 1 tablet by mouth 2 (two) times daily.    Marland Kitchen diltiazem (CARDIZEM CD) 240 MG 24 hr capsule Take 180 mg by mouth daily.     Marland Kitchen docusate sodium (COLACE) 100 MG capsule Take 100 mg by mouth daily as needed for mild constipation.    Marland Kitchen escitalopram (LEXAPRO) 10 MG tablet Take 5 mg by mouth daily.     . fexofenadine (ALLEGRA) 180 MG tablet Take 180 mg by mouth as needed for allergies or rhinitis.    Marland Kitchen lansoprazole (PREVACID) 15 MG capsule Take 30 mg by mouth daily.    . Menthol, Topical Analgesic, (BENGAY VANISHING SCENT) 2.5 % GEL Apply topically as needed (for pain).     . metoprolol (LOPRESSOR) 50 MG tablet Take 35 mg by mouth 2 (two) times daily.     . mirtazapine (REMERON) 15 MG tablet Take 15 mg by mouth at bedtime.    . Multiple Vitamins-Minerals (CENTRUM SILVER PO) Take 1 tablet by mouth daily.    . Multiple Vitamins-Minerals (PRESERVISION AREDS 2) CAPS Take 1 capsule by mouth 2 (two) times daily.     . nitroGLYCERIN (NITROSTAT) 0.4 MG SL tablet Place 0.4 mg under the tongue every 5 (five) minutes as needed for chest pain.     . Probiotic Product (PROBIOTIC DAILY PO) Take 1 tablet by mouth daily.     . traMADol (ULTRAM) 50 MG tablet Take 1-2 tablets (50-100 mg total) by mouth every 4 (four) hours as needed for moderate pain. 30 tablet 0  . warfarin (COUMADIN) 1 MG tablet Take 0.5 mg by mouth every evening. Along with warfarin 3 mg    . warfarin (COUMADIN) 3 MG tablet Take 3 mg by mouth every evening. Along with warfarin 0.5 mg     No current facility-administered medications for this visit.    Review of Systems Review of Systems   Constitutional: Positive for unexpected weight change. Negative for fever.  HENT: Negative.  Eyes: Negative.   Respiratory: Negative.   Cardiovascular: Negative.   Endocrine: Negative.   Genitourinary: Negative.   Allergic/Immunologic: Negative.   Neurological: Negative.   Hematological: Negative.   Psychiatric/Behavioral: Negative.     Blood pressure 130/72, pulse 89, resp. rate 16, height 4\' 8"  (1.422 m), weight 107 lb 6.4 oz (48.716 kg).  The patient's weight is down 22 pounds from her spring 2016 exam.  Physical Exam Physical Exam  Constitutional: She is oriented to person, place, and time. She appears well-developed and well-nourished.  Eyes: Conjunctivae are normal. No scleral icterus.  Neck: Neck supple.  Cardiovascular: Normal rate.   Murmur (grade 2) heard. Pulmonary/Chest: Effort normal and breath sounds normal.  Musculoskeletal: Normal range of motion.       Back:  Lymphadenopathy:    She has no cervical adenopathy.       Right: No inguinal adenopathy present.       Left: No inguinal adenopathy present.  Neurological: She is alert and oriented to person, place, and time.  Skin: Skin is warm and dry.  There is a 5 x 6 cm hard erythematous area on the left gluteal area.  No inguinal adenopathy    Psychiatric: She has a normal mood and affect.    Data Reviewed Barb Merino CT of the right hip completed when the patient presented with increasing pain shows on the edges of the images a calcified mass in the soft tissues just to the left of the midline in the gluteal region.  Assessment    Calcified gluteal mass with subsequent pressure on the overlying skin. No evidence of abscess.    Plan    This is to be managed it will require surgical excision. The patient will consider whether the area symptomatic enough to warrant intervention.  The patient was encouraged to discontinue her present antibiotics as I don't believe this is infectious process.    PCP:  [place] Pink This has been scribed by Lesly Rubenstein LPN   Robert Bellow 04/14/2016, 9:24 PM

## 2016-04-14 NOTE — Progress Notes (Deleted)
Patient ID: Peggy [name], female   DOB: Jan 19, 1928, 82 y.o.   MRN: [id number]  Chief Complaint  Patient presents with  . Other    left gulteal abscess    HPI Peggy [name] is a 82 y.o. female HPI  Past Medical History  Diagnosis Date  . Essential hypertension, benign   . Arthritis   . Heart disease 2001  . Hypertension 1996  . Stroke [name]) 2009  . Unspecified essential hypertension   . Hearing disorder 2012    hearing aids  . Reflux     acid reflux  . Breast screening, unspecified   . Lump or mass in breast   . Special screening for malignant neoplasms, colon   . Other benign neoplasm of connective and other soft tissue of thorax   . Personal history of malignant neoplasm of breast   . Atrial fibrillation ([place])   . Allergy   . Atrial fibrillation ([place])   . Atrial fibrillation with rapid ventricular response [facility]) November 2015    Heart rate up to 140 requiring IV Cardizem  . Breast cancer [facility]) 2012    Patient underwent a left mastectomy on 03-31-11. Pathology showed DCIS at both sites without an invasive component. Sentinel nodes were negative. The breast lesions were noted to be ER +40%, PR-positive, 5%.   . Malignant neoplasm of upper-inner quadrant of female breast ([place])   . Malignant neoplasm of upper-outer quadrant of female breast ([place])   . Hip pain   . Coronary artery disease   . Heart attack ([place]) 2001  . Anxiety   . GERD (gastroesophageal reflux disease)   . Diverticulosis   . Hyperlipidemia     Past Surgical History  Procedure Laterality Date  . Mastectomy  2012    left breast  . Breast biopsy      left breast biopsy over 15 years ago   . Upper gi endoscopy  2009    [facility] Dr. Donnella [name]  . Colonoscopy  2009    [facility] Dr. Donnella [name]  . Appendectomy      age 38  . Tonsillectomy      as a child  . Cholecystectomy      40 years ago   . Abdominal hysterectomy      age 59  . Small intestine surgery  12/31/2012    Abdominal exploration, lysis of adhesions  for distal small bowel obstruction. Peggy Brome, MD  . Eye surgery Bilateral     Cataract Extraction with IOL  . Hernia repair    . Colon surgery      Dr. Tamala [name], [facility]  . Total hip arthroplasty Right 02/27/2016    Procedure: TOTAL HIP ARTHROPLASTY;  Surgeon: Peggy Leep, MD;  Location: [facility] ORS;  Service: Orthopedics;  Laterality: Right;    Family History  Problem Relation Age of Onset  . Colon cancer Neg Hx   . Ovarian cancer Neg Hx   . Breast cancer Sister     x 2, in their 35's  . Lung cancer Brother   . Stroke Father   . Heart disease Father   . Heart disease Mother     Social History Social History  Substance Use Topics  . Smoking status: Never Smoker   . Smokeless tobacco: Never Used  . Alcohol Use: No    Allergies  Allergen Reactions  . Alendronate Sodium     REACTION: questionable  . Amoxicillin-Pot Clavulanate   . Etodolac   . Ibandronate Sodium  REACTION: questionable  . Iodine     Other reaction(s): Unknown  . Lactose Intolerance (Gi) Other (See Comments)    GI problems  . Minocycline Hcl   . Risedronate Sodium   . Sulfonamide Derivatives Other (See Comments)    Stomach pain  . Zoledronic Acid     REACTION: "dental problems"    Current Outpatient Prescriptions  Medication Sig Dispense Refill  . acetaminophen (TYLENOL) 325 MG tablet Take 325 mg by mouth every 6 (six) hours as needed for mild pain.     Peggy [name] ALPRAZolam (XANAX) 0.25 MG tablet Take 0.25 mg by mouth at bedtime as needed for anxiety or sleep.     Peggy [name] aspirin EC 81 MG tablet Take 81 mg by mouth daily.    . Calcium Carbonate-Vitamin D (RA CALCIUM PLUS VITAMIN D) 600-400 MG-UNIT tablet Take 1 tablet by mouth 2 (two) times daily.    Peggy [name] diltiazem (CARDIZEM CD) 240 MG 24 hr capsule Take 240 mg by mouth daily.    Peggy [name] docusate sodium (COLACE) 100 MG capsule Take 100 mg by mouth daily as needed for mild constipation.    Peggy [name] escitalopram (LEXAPRO) 10 MG tablet Take 10 mg by mouth daily.    .  fexofenadine (ALLEGRA) 180 MG tablet Take 180 mg by mouth as needed for allergies or rhinitis.    Peggy [name] HYDROcodone-acetaminophen (NORCO/VICODIN) 5-325 MG tablet Take 1-2 tablets by mouth every 4 (four) hours as needed for moderate pain. 30 tablet 0  . lansoprazole (PREVACID) 15 MG capsule Take 30 mg by mouth daily.    . Menthol, Topical Analgesic, (BENGAY VANISHING SCENT) 2.5 % GEL Apply topically as needed (for pain).     . metoprolol (LOPRESSOR) 50 MG tablet Take 50 mg by mouth 2 (two) times daily.    . Multiple Vitamins-Minerals (CENTRUM SILVER PO) Take 1 tablet by mouth daily.    . Multiple Vitamins-Minerals (PRESERVISION AREDS 2) CAPS Take 1 capsule by mouth 2 (two) times daily.     . nitroGLYCERIN (NITROSTAT) 0.4 MG SL tablet Place 0.4 mg under the tongue every 5 (five) minutes as needed for chest pain.     . Probiotic Product (PROBIOTIC DAILY PO) Take 1 tablet by mouth daily.     Peggy [name] tiZANidine (ZANAFLEX) 2 MG tablet Take 2 mg by mouth 3 (three) times daily as needed for muscle spasms.    . traMADol (ULTRAM) 50 MG tablet Take 1-2 tablets (50-100 mg total) by mouth every 4 (four) hours as needed for moderate pain. 30 tablet 0  . warfarin (COUMADIN) 1 MG tablet Take 0.5 mg by mouth every evening. Along with warfarin 3 mg    . warfarin (COUMADIN) 3 MG tablet Take 3 mg by mouth every evening. Along with warfarin 0.5 mg     No current facility-administered medications for this visit.    Review of Systems Review of Systems    There were no vitals taken for this visit.  Physical Exam Physical Exam  Constitutional: She is oriented to person, place, and time. She appears well-developed.  Neurological: She is alert and oriented to person, place, and time.  Skin: Skin is warm and dry.    Data Reviewed ***  Assessment    ***    Plan    ***       Peggy [name] 04/14/2016, 8:54 AM

## 2016-04-17 ENCOUNTER — Telehealth: Payer: Self-pay | Admitting: *Deleted

## 2016-04-17 NOTE — Telephone Encounter (Signed)
Patients daughter called and wanted to let Dr Bary Castilla know that she has decided to proceed with the excision of the left gluteal skin lesion. She also wanted him to know that the pt has had to stop her coumadin for a week because her levels were to high . The patient has physical therapy scheduled until May 15. She also has a few questions about recovery time?

## 2016-04-29 NOTE — Telephone Encounter (Signed)
Spoke to [employer] re: removal of the calcified mass on the gluteal area. Mom just recently noted with INR > 6, now back toward normal. Need to be off coumadin for five days reviewed. Selinda Eon to contact re: scheduling. Plan as outpatient, admission if needed for pain control.

## 2016-05-01 ENCOUNTER — Telehealth: Payer: Self-pay | Admitting: *Deleted

## 2016-05-01 ENCOUNTER — Other Ambulatory Visit: Payer: Self-pay | Admitting: General Surgery

## 2016-05-01 DIAGNOSIS — R229 Localized swelling, mass and lump, unspecified: Principal | ICD-10-CM

## 2016-05-01 DIAGNOSIS — IMO0002 Reserved for concepts with insufficient information to code with codable children: Secondary | ICD-10-CM

## 2016-05-01 NOTE — H&P (Signed)
HPI Peggy [name] is a 82 y.o. female here for evaluation of a gluteal abscess. This was first noticed about several months ago. It felt like a hard area. It started hurting just prior to her surgery for her right hip about 6 weeks ago. She is currently taking antibiotics for this. She recently had a right hip replacement done. She had been seen by the wound clinic and referred for consideration of surgical extirpation.  Patient is company by her daughter who was present for the interview and exam. The patient had not notified Dr. Marry Guan of the lesion prior to her recent surgery. She reports she has had no pain in her hips and surgical intervention.  I personally reviewed the patient's history. HPI  Past Medical History  Diagnosis Date  . Essential hypertension, benign   . Arthritis   . Heart disease 2001  . Hypertension 1996  . Stroke [facility]) 2009  . Unspecified essential hypertension   . Hearing disorder 2012    hearing aids  . Reflux     acid reflux  . Breast screening, unspecified   . Lump or mass in breast   . Special screening for malignant neoplasms, colon   . Other benign neoplasm of connective and other soft tissue of thorax   . Personal history of malignant neoplasm of breast   . Atrial fibrillation ([place])   . Allergy   . Atrial fibrillation ([place])   . Atrial fibrillation with rapid ventricular response [facility]) November 2015    Heart rate up to 140 requiring IV Cardizem  . Breast cancer [facility]) 2012    Patient underwent a left mastectomy on 03-31-11. Pathology showed DCIS at both sites without an invasive component. Sentinel nodes were negative. The breast lesions were noted to be ER +40%, PR-positive, 5%.   . Malignant neoplasm of upper-inner quadrant of female breast ([place])   . Malignant neoplasm of upper-outer quadrant of female breast ([place])   . Hip pain   . Coronary artery disease   . Heart  attack ([place]) 2001  . Anxiety   . GERD (gastroesophageal reflux disease)   . Diverticulosis   . Hyperlipidemia     Past Surgical History  Procedure Laterality Date  . Mastectomy  2012    left breast  . Breast biopsy      left breast biopsy over 15 years ago   . Upper gi endoscopy  2009    [facility] Dr. Donnella Sham  . Colonoscopy  2009    [facility] Dr. Donnella Sham  . Appendectomy      age 61  . Tonsillectomy      as a child  . Cholecystectomy      40 years ago   . Abdominal hysterectomy      age 31  . Small intestine surgery  12/31/2012    Abdominal exploration, lysis of adhesions for distal small bowel obstruction. Rochel Brome, MD  . Eye surgery Bilateral     Cataract Extraction with IOL  . Hernia repair    . Colon surgery      Dr. Tamala Julian, [facility]  . Total hip arthroplasty Right 02/27/2016    Procedure: TOTAL HIP ARTHROPLASTY; Surgeon: Dereck Leep, MD; Location: [facility] ORS; Service: Orthopedics; Laterality: Right;    Family History  Problem Relation Age of Onset  . Colon cancer Neg Hx   . Ovarian cancer Neg Hx   . Breast cancer Sister     x 2, in their 77's  . Lung cancer Brother   .  Stroke Father   . Heart disease Father   . Heart disease Mother     Social History Social History  Substance Use Topics  . Smoking status: Never Smoker   . Smokeless tobacco: Never Used  . Alcohol Use: No    Allergies  Allergen Reactions  . Alendronate Sodium     REACTION: questionable  . Amoxicillin-Pot Clavulanate   . Etodolac   . Ibandronate Sodium     REACTION: questionable  . Iodine     Other reaction(s): Unknown  . Lactose Intolerance (Gi) Other (See Comments)    GI problems  . Minocycline Hcl   . Risedronate Sodium   . Sulfonamide Derivatives Other (See Comments)    Stomach pain    . Zoledronic Acid     REACTION: "dental problems"    Current Outpatient Prescriptions  Medication Sig Dispense Refill  . acetaminophen (TYLENOL) 325 MG tablet Take 325 mg by mouth every 6 (six) hours as needed for mild pain.     Marland Kitchen ALPRAZolam (XANAX) 0.25 MG tablet Take 0.25 mg by mouth at bedtime as needed for anxiety or sleep.     Marland Kitchen aspirin EC 81 MG tablet Take 81 mg by mouth daily.    . Calcium Carbonate-Vitamin D (RA CALCIUM PLUS VITAMIN D) 600-400 MG-UNIT tablet Take 1 tablet by mouth 2 (two) times daily.    Marland Kitchen diltiazem (CARDIZEM CD) 240 MG 24 hr capsule Take 180 mg by mouth daily.     Marland Kitchen docusate sodium (COLACE) 100 MG capsule Take 100 mg by mouth daily as needed for mild constipation.    Marland Kitchen escitalopram (LEXAPRO) 10 MG tablet Take 5 mg by mouth daily.     . fexofenadine (ALLEGRA) 180 MG tablet Take 180 mg by mouth as needed for allergies or rhinitis.    Marland Kitchen lansoprazole (PREVACID) 15 MG capsule Take 30 mg by mouth daily.    . Menthol, Topical Analgesic, (BENGAY VANISHING SCENT) 2.5 % GEL Apply topically as needed (for pain).     . metoprolol (LOPRESSOR) 50 MG tablet Take 35 mg by mouth 2 (two) times daily.     . mirtazapine (REMERON) 15 MG tablet Take 15 mg by mouth at bedtime.    . Multiple Vitamins-Minerals (CENTRUM SILVER PO) Take 1 tablet by mouth daily.    . Multiple Vitamins-Minerals (PRESERVISION AREDS 2) CAPS Take 1 capsule by mouth 2 (two) times daily.     . nitroGLYCERIN (NITROSTAT) 0.4 MG SL tablet Place 0.4 mg under the tongue every 5 (five) minutes as needed for chest pain.     . Probiotic Product (PROBIOTIC DAILY PO) Take 1 tablet by mouth daily.     . traMADol (ULTRAM) 50 MG tablet Take 1-2 tablets (50-100 mg total) by mouth every 4 (four) hours as needed for moderate pain. 30 tablet 0  . warfarin (COUMADIN) 1 MG tablet Take 0.5 mg by mouth every evening. Along with warfarin 3  mg    . warfarin (COUMADIN) 3 MG tablet Take 3 mg by mouth every evening. Along with warfarin 0.5 mg     No current facility-administered medications for this visit.    Review of Systems Review of Systems  Constitutional: Positive for unexpected weight change. Negative for fever.  HENT: Negative.  Eyes: Negative.  Respiratory: Negative.  Cardiovascular: Negative.  Endocrine: Negative.  Genitourinary: Negative.  Allergic/Immunologic: Negative.  Neurological: Negative.  Hematological: Negative.  Psychiatric/Behavioral: Negative.    Blood pressure 130/72, pulse 89, resp. rate 16, height 4\' 8"  (  1.422 m), weight 107 lb 6.4 oz (48.716 kg).  The patient's weight is down 22 pounds from her spring 2016 exam.  Physical Exam Physical Exam  Constitutional: She is oriented to person, place, and time. She appears well-developed and well-nourished.  Eyes: Conjunctivae are normal. No scleral icterus.  Neck: Neck supple.  Cardiovascular: Normal rate.  Murmur (grade 2) heard. Pulmonary/Chest: Effort normal and breath sounds normal.  Musculoskeletal: Normal range of motion.   Back:  Lymphadenopathy:   She has no cervical adenopathy.   Right: No inguinal adenopathy present.   Left: No inguinal adenopathy present.  Neurological: She is alert and oriented to person, place, and time.  Skin: Skin is warm and dry.  There is a 5 x 6 cm hard erythematous area on the left gluteal area.  No inguinal adenopathy   Psychiatric: She has a normal mood and affect.    Data Reviewed Barb Merino CT of the right hip completed when the patient presented with increasing pain shows on the edges of the images a calcified mass in the soft tissues just to the left of the midline in the gluteal region.  Assessment    Calcified gluteal mass with subsequent pressure on the overlying skin. No evidence of abscess.    Plan    This is to be managed it will require surgical  excision. The patient will consider whether the area symptomatic enough to warrant intervention.  The patient was encouraged to discontinue her present antibiotics as I don't believe this is infectious process.    PCP: [place] Pink This has been scribed by Lesly Rubenstein LPN

## 2016-05-01 NOTE — Telephone Encounter (Signed)
Patient's surgery has been scheduled for 05-09-16 at [facility].   This patient has been asked to discontinue coumadin 5 days prior to surgery. It is okay for patient to continue 81 mg aspirin once daily.

## 2016-05-05 ENCOUNTER — Ambulatory Visit
Admission: RE | Admit: 2016-05-05 | Discharge: 2016-05-05 | Disposition: A | Discharge status: Home / Self Care | Payer: Medicare Other | Source: Ambulatory Visit | Attending: General Surgery | Admitting: General Surgery

## 2016-05-05 ENCOUNTER — Encounter
Admission: RE | Admit: 2016-05-05 | Discharge: 2016-05-05 | Disposition: A | Discharge status: Home / Self Care | Payer: Medicare Other | Source: Ambulatory Visit | Attending: General Surgery | Admitting: General Surgery

## 2016-05-05 ENCOUNTER — Telehealth: Payer: Self-pay | Admitting: *Deleted

## 2016-05-05 DIAGNOSIS — Z01812 Encounter for preprocedural laboratory examination: Secondary | ICD-10-CM | POA: Diagnosis present

## 2016-05-05 DIAGNOSIS — IMO0002 Reserved for concepts with insufficient information to code with codable children: Secondary | ICD-10-CM

## 2016-05-05 DIAGNOSIS — Z01810 Encounter for preprocedural cardiovascular examination: Secondary | ICD-10-CM | POA: Diagnosis not present

## 2016-05-05 DIAGNOSIS — R229 Localized swelling, mass and lump, unspecified: Principal | ICD-10-CM

## 2016-05-05 DIAGNOSIS — I4891 Unspecified atrial fibrillation: Secondary | ICD-10-CM | POA: Diagnosis not present

## 2016-05-05 HISTORY — DX: Reserved for inherently not codable concepts without codable children: IMO0001

## 2016-05-05 HISTORY — DX: Angina pectoris, unspecified: I20.9

## 2016-05-05 HISTORY — DX: Cardiac arrhythmia, unspecified: I49.9

## 2016-05-05 LAB — DIFFERENTIAL
BASOS ABS: 0.1 10*3/uL (ref 0–0.1)
Basophils Relative: 1 %
Eosinophils Absolute: 0.3 10*3/uL (ref 0–0.7)
Eosinophils Relative: 4 %
LYMPHS PCT: 33 %
Lymphs Abs: 2.7 10*3/uL (ref 1.0–3.6)
Monocytes Absolute: 0.9 10*3/uL (ref 0.2–0.9)
Monocytes Relative: 11 %
NEUTROS ABS: 4.4 10*3/uL (ref 1.4–6.5)
NEUTROS PCT: 51 %

## 2016-05-05 LAB — PROTIME-INR
INR: 1.52
PROTHROMBIN TIME: 18.4 s — AB (ref 11.4–15.0)

## 2016-05-05 LAB — CBC
HCT: 31.3 % — ABNORMAL LOW (ref 35.0–47.0)
Hemoglobin: 10.4 g/dL — ABNORMAL LOW (ref 12.0–16.0)
MCH: 30.6 pg (ref 26.0–34.0)
MCHC: 33.2 g/dL (ref 32.0–36.0)
MCV: 92.2 fL (ref 80.0–100.0)
PLATELETS: 388 10*3/uL (ref 150–440)
RBC: 3.39 MIL/uL — ABNORMAL LOW (ref 3.80–5.20)
RDW: 14.1 % (ref 11.5–14.5)
WBC: 8.4 10*3/uL (ref 3.6–11.0)

## 2016-05-05 NOTE — Telephone Encounter (Signed)
Per Edd Fabian in the Pre-admission Department, patient needs cardiac clearance due to recent onset of angina.   Patient sees Dr. Ubaldo Glassing at [facility]. Her last appointment with him was 02-04-16 for a 6 month check up. Due to new issue, their office would like to see patient pre-op. An appointment has been scheduled for patient to see Dr. Ubaldo Glassing tomorrow, 05-06-16 at 10:45 am. Daughter aware.   This patient is presently scheduled for surgery on 05-09-16 at [facility].

## 2016-05-05 NOTE — Patient Instructions (Signed)
  Your procedure is scheduled on: May 09, 2016 (Friday) Report to Day Surgery.([facility]ICAL [facility]) SECOND FLOOR To find out your arrival time please call [phone] between 1PM - 3PM on May 08, 2016 (Thursday).  Remember: Instructions that are not followed completely may result in serious medical risk, up to and including death, or upon the discretion of your surgeon and anesthesiologist your surgery may need to be rescheduled.    __x__ 1. Do not eat food or drink liquids after midnight. No gum chewing or hard candies.     ____ 2. No Alcohol for 24 hours before or after surgery.   ____ 3. Bring all medications with you on the day of surgery if instructed.    __x__ 4. Notify your doctor if there is any change in your medical condition     (cold, fever, infections).     Do not wear jewelry, make-up, hairpins, clips or nail polish.  Do not wear lotions, powders, or perfumes. You may wear deodorant.  Do not shave 48 hours prior to surgery. Men may shave face and neck.  Do not bring valuables to the hospital.    [facility] is not responsible for any belongings or valuables.               Contacts, dentures or bridgework may not be worn into surgery.  Leave your suitcase in the car. After surgery it may be brought to your room.  For patients admitted to the hospital, discharge time is determined by your                treatment team.   Patients discharged the day of surgery will not be allowed to drive home.   Please read over the following fact sheets that you were given:   Surgical Site Infection Prevention   ____ Take these medicines the morning of surgery with A SIP OF WATER:    1. Metoprolol  2. Diltiazem  3. Prevacid  4. Prevacid at bedtime on Thursday night  5.  6.  ____ Fleet Enema (as directed)   __x__ Use CHG Soap as directed  ____ Use inhalers on the day of surgery  ____ Stop metformin 2 days prior to surgery    ____ Take 1/2 of usual insulin dose the night before  surgery and none on the morning of surgery.   __x__ Stop Coumadin/Plavix/aspirin on (NO ASPIRIN THE MORNING OF SURGERY, PATIENT STOPPED WARFARIN ON MAY  13  AS ORDERED)  __x__ Stop Anti-inflammatories on (NO NSAIDS) MAY CONTINUE TO TAKE TYLENOL  IF NEEDED)  __X__ Stop supplements until after surgery. ( STOP VITAMIN B-12, PROBIOTIC AND PRESERVISION NOW)  ____ Bring C-Pap to the hospital.

## 2016-05-05 NOTE — Pre-Procedure Instructions (Signed)
Dr. Amie Critchley notified of patient c/o of chest pain on May 14, and relieved by Nitroglycerin after second dose and Metoprolol. Cardiac clearance requested.

## 2016-05-05 NOTE — Pre-Procedure Instructions (Signed)
Dr. Bary Castilla office called and faxed request for cardiac clearance

## 2016-05-06 NOTE — Pre-Procedure Instructions (Signed)
[place] RISK 05/06/16

## 2016-05-09 ENCOUNTER — Encounter: Payer: Self-pay | Admitting: *Deleted

## 2016-05-09 ENCOUNTER — Ambulatory Visit: Payer: Medicare Other | Admitting: Registered Nurse

## 2016-05-09 ENCOUNTER — Ambulatory Visit
Admission: RE | Admit: 2016-05-09 | Discharge: 2016-05-09 | Disposition: A | Discharge status: Home / Self Care | Payer: Medicare Other | Source: Ambulatory Visit | Attending: General Surgery | Admitting: General Surgery

## 2016-05-09 ENCOUNTER — Encounter
Admission: RE | Disposition: A | Discharge status: Home / Self Care | Payer: Self-pay | Source: Ambulatory Visit | Attending: General Surgery

## 2016-05-09 DIAGNOSIS — L728 Other follicular cysts of the skin and subcutaneous tissue: Secondary | ICD-10-CM | POA: Insufficient documentation

## 2016-05-09 DIAGNOSIS — Z96641 Presence of right artificial hip joint: Secondary | ICD-10-CM

## 2016-05-09 DIAGNOSIS — I10 Essential (primary) hypertension: Secondary | ICD-10-CM

## 2016-05-09 DIAGNOSIS — E785 Hyperlipidemia, unspecified: Secondary | ICD-10-CM | POA: Diagnosis present

## 2016-05-09 DIAGNOSIS — Z803 Family history of malignant neoplasm of breast: Secondary | ICD-10-CM

## 2016-05-09 DIAGNOSIS — I4891 Unspecified atrial fibrillation: Secondary | ICD-10-CM

## 2016-05-09 DIAGNOSIS — Z853 Personal history of malignant neoplasm of breast: Secondary | ICD-10-CM

## 2016-05-09 DIAGNOSIS — Z91041 Radiographic dye allergy status: Secondary | ICD-10-CM | POA: Insufficient documentation

## 2016-05-09 DIAGNOSIS — Z823 Family history of stroke: Secondary | ICD-10-CM | POA: Insufficient documentation

## 2016-05-09 DIAGNOSIS — Z961 Presence of intraocular lens: Secondary | ICD-10-CM | POA: Diagnosis present

## 2016-05-09 DIAGNOSIS — R000 Tachycardia, unspecified: Secondary | ICD-10-CM | POA: Diagnosis not present

## 2016-05-09 DIAGNOSIS — Z882 Allergy status to sulfonamides status: Secondary | ICD-10-CM

## 2016-05-09 DIAGNOSIS — Z9889 Other specified postprocedural states: Secondary | ICD-10-CM

## 2016-05-09 DIAGNOSIS — I252 Old myocardial infarction: Secondary | ICD-10-CM

## 2016-05-09 DIAGNOSIS — R229 Localized swelling, mass and lump, unspecified: Secondary | ICD-10-CM | POA: Diagnosis present

## 2016-05-09 DIAGNOSIS — Z8673 Personal history of transient ischemic attack (TIA), and cerebral infarction without residual deficits: Secondary | ICD-10-CM

## 2016-05-09 DIAGNOSIS — Z801 Family history of malignant neoplasm of trachea, bronchus and lung: Secondary | ICD-10-CM

## 2016-05-09 DIAGNOSIS — Z90710 Acquired absence of both cervix and uterus: Secondary | ICD-10-CM

## 2016-05-09 DIAGNOSIS — Z7901 Long term (current) use of anticoagulants: Secondary | ICD-10-CM

## 2016-05-09 DIAGNOSIS — Z881 Allergy status to other antibiotic agents status: Secondary | ICD-10-CM

## 2016-05-09 DIAGNOSIS — M1990 Unspecified osteoarthritis, unspecified site: Secondary | ICD-10-CM | POA: Diagnosis present

## 2016-05-09 DIAGNOSIS — Z9841 Cataract extraction status, right eye: Secondary | ICD-10-CM

## 2016-05-09 DIAGNOSIS — Z79899 Other long term (current) drug therapy: Secondary | ICD-10-CM

## 2016-05-09 DIAGNOSIS — Z9842 Cataract extraction status, left eye: Secondary | ICD-10-CM

## 2016-05-09 DIAGNOSIS — Z888 Allergy status to other drugs, medicaments and biological substances status: Secondary | ICD-10-CM

## 2016-05-09 DIAGNOSIS — H9193 Unspecified hearing loss, bilateral: Secondary | ICD-10-CM

## 2016-05-09 DIAGNOSIS — H9190 Unspecified hearing loss, unspecified ear: Secondary | ICD-10-CM | POA: Diagnosis present

## 2016-05-09 DIAGNOSIS — IMO0002 Reserved for concepts with insufficient information to code with codable children: Secondary | ICD-10-CM

## 2016-05-09 DIAGNOSIS — K219 Gastro-esophageal reflux disease without esophagitis: Secondary | ICD-10-CM | POA: Insufficient documentation

## 2016-05-09 DIAGNOSIS — Z9049 Acquired absence of other specified parts of digestive tract: Secondary | ICD-10-CM

## 2016-05-09 DIAGNOSIS — Z8249 Family history of ischemic heart disease and other diseases of the circulatory system: Secondary | ICD-10-CM

## 2016-05-09 DIAGNOSIS — Z7982 Long term (current) use of aspirin: Secondary | ICD-10-CM

## 2016-05-09 DIAGNOSIS — I119 Hypertensive heart disease without heart failure: Secondary | ICD-10-CM | POA: Diagnosis present

## 2016-05-09 DIAGNOSIS — Z9012 Acquired absence of left breast and nipple: Secondary | ICD-10-CM

## 2016-05-09 DIAGNOSIS — I2510 Atherosclerotic heart disease of native coronary artery without angina pectoris: Secondary | ICD-10-CM

## 2016-05-09 DIAGNOSIS — I480 Paroxysmal atrial fibrillation: Secondary | ICD-10-CM | POA: Diagnosis not present

## 2016-05-09 DIAGNOSIS — M7989 Other specified soft tissue disorders: Secondary | ICD-10-CM | POA: Insufficient documentation

## 2016-05-09 DIAGNOSIS — E739 Lactose intolerance, unspecified: Secondary | ICD-10-CM | POA: Diagnosis present

## 2016-05-09 DIAGNOSIS — Z974 Presence of external hearing-aid: Secondary | ICD-10-CM

## 2016-05-09 HISTORY — PX: EXCISION OF BACK LESION: SHX6597

## 2016-05-09 SURGERY — EXCISION, LESION, BACK
Anesthesia: General | Laterality: Left | Wound class: Clean

## 2016-05-09 MED ORDER — ONDANSETRON HCL 4 MG/2ML IJ SOLN
INTRAMUSCULAR | Status: DC | PRN
  Administered 2016-05-09: 4 mg via INTRAVENOUS

## 2016-05-09 MED ORDER — CEFAZOLIN SODIUM-DEXTROSE 2-4 GM/100ML-% IV SOLN
INTRAVENOUS | Status: AC
  Filled 2016-05-09: qty 100

## 2016-05-09 MED ORDER — EPHEDRINE SULFATE 50 MG/ML IJ SOLN
INTRAMUSCULAR | Status: DC | PRN
  Administered 2016-05-09: 10 mg via INTRAVENOUS

## 2016-05-09 MED ORDER — DEXAMETHASONE SODIUM PHOSPHATE 10 MG/ML IJ SOLN
INTRAMUSCULAR | Status: DC | PRN
  Administered 2016-05-09: 5 mg via INTRAVENOUS

## 2016-05-09 MED ORDER — CEFAZOLIN SODIUM-DEXTROSE 2-4 GM/100ML-% IV SOLN
2.0000 g | INTRAVENOUS | Status: AC
  Administered 2016-05-09: 2 g via INTRAVENOUS

## 2016-05-09 MED ORDER — GLYCOPYRROLATE 0.2 MG/ML IJ SOLN
INTRAMUSCULAR | Status: DC | PRN
Start: 2016-05-09 — End: 2016-05-09
  Administered 2016-05-09: 0.2 mg via INTRAVENOUS

## 2016-05-09 MED ORDER — KETOROLAC TROMETHAMINE 30 MG/ML IJ SOLN
INTRAMUSCULAR | Status: DC | PRN
  Administered 2016-05-09: 15 mg via INTRAVENOUS

## 2016-05-09 MED ORDER — LACTATED RINGERS IV SOLN
INTRAVENOUS | Status: DC
  Administered 2016-05-09: 11:00:00 via INTRAVENOUS

## 2016-05-09 MED ORDER — LIDOCAINE HCL (CARDIAC) 20 MG/ML IV SOLN
INTRAVENOUS | Status: DC | PRN
  Administered 2016-05-09: 60 mg via INTRAVENOUS

## 2016-05-09 MED ORDER — ROCURONIUM BROMIDE 100 MG/10ML IV SOLN
INTRAVENOUS | Status: DC | PRN
  Administered 2016-05-09: 30 mg via INTRAVENOUS

## 2016-05-09 MED ORDER — PROPOFOL 10 MG/ML IV BOLUS
INTRAVENOUS | Status: DC | PRN
  Administered 2016-05-09: 130 mg via INTRAVENOUS

## 2016-05-09 MED ORDER — MIDAZOLAM HCL 2 MG/2ML IJ SOLN
INTRAMUSCULAR | Status: DC | PRN
  Administered 2016-05-09: 1 mg via INTRAVENOUS

## 2016-05-09 MED ORDER — FENTANYL CITRATE (PF) 100 MCG/2ML IJ SOLN
INTRAMUSCULAR | Status: DC | PRN
  Administered 2016-05-09: 50 ug via INTRAVENOUS

## 2016-05-09 MED ORDER — BUPIVACAINE HCL (PF) 0.5 % IJ SOLN
INTRAMUSCULAR | Status: DC | PRN
  Administered 2016-05-09: 20 mL

## 2016-05-09 MED ORDER — ACETAMINOPHEN 10 MG/ML IV SOLN
INTRAVENOUS | Status: DC | PRN
  Administered 2016-05-09: 1000 mg via INTRAVENOUS

## 2016-05-09 MED ORDER — SUGAMMADEX SODIUM 200 MG/2ML IV SOLN
INTRAVENOUS | Status: DC | PRN
  Administered 2016-05-09: 100 mg via INTRAVENOUS

## 2016-05-09 MED ORDER — BUPIVACAINE HCL (PF) 0.5 % IJ SOLN
INTRAMUSCULAR | Status: AC
  Filled 2016-05-09: qty 30

## 2016-05-09 MED ORDER — ACETAMINOPHEN 10 MG/ML IV SOLN
INTRAVENOUS | Status: AC
Start: 2016-05-09 — End: 2016-05-09
  Filled 2016-05-09: qty 100

## 2016-05-09 MED ORDER — PHENYLEPHRINE HCL 10 MG/ML IJ SOLN
INTRAMUSCULAR | Status: DC | PRN
  Administered 2016-05-09 (×2): 100 ug via INTRAVENOUS

## 2016-05-09 SURGICAL SUPPLY — 35 items
BLADE SURG 15 STRL SS SAFETY (BLADE) ×3 IMPLANT
BULB RESERV EVAC DRAIN JP 100C (MISCELLANEOUS) ×3 IMPLANT
CANISTER SUCT 1200ML W/VALVE (MISCELLANEOUS) ×3 IMPLANT
CHLORAPREP W/TINT 26ML (MISCELLANEOUS) ×3 IMPLANT
CLOSURE WOUND 1/2 X4 (GAUZE/BANDAGES/DRESSINGS) ×1
DRAIN CHANNEL JP 15F RND 16 (MISCELLANEOUS) ×3 IMPLANT
DRAPE LAPAROTOMY 100X77 ABD (DRAPES) ×3 IMPLANT
DRESSING TELFA 4X3 1S ST N-ADH (GAUZE/BANDAGES/DRESSINGS) ×3 IMPLANT
DRSG TEGADERM 4X4.75 (GAUZE/BANDAGES/DRESSINGS) ×6 IMPLANT
ELECT REM PT RETURN 9FT ADLT (ELECTROSURGICAL) ×3
ELECTRODE REM PT RTRN 9FT ADLT (ELECTROSURGICAL) ×1 IMPLANT
GAUZE SPONGE 4X4 12PLY STRL (GAUZE/BANDAGES/DRESSINGS) IMPLANT
GLOVE BIO SURGEON STRL SZ7.5 (GLOVE) ×9 IMPLANT
GLOVE INDICATOR 8.0 STRL GRN (GLOVE) ×6 IMPLANT
GOWN STRL REUS W/ TWL LRG LVL3 (GOWN DISPOSABLE) ×2 IMPLANT
GOWN STRL REUS W/TWL LRG LVL3 (GOWN DISPOSABLE) ×4
KIT RM TURNOVER STRD PROC AR (KITS) ×3 IMPLANT
LABEL OR SOLS (LABEL) ×3 IMPLANT
NDL SAFETY 25GX1.5 (NEEDLE) ×3 IMPLANT
PACK BASIN MINOR ARMC (MISCELLANEOUS) ×3 IMPLANT
SOL PREP PVP 2OZ (MISCELLANEOUS) ×3
SOLUTION PREP PVP 2OZ (MISCELLANEOUS) ×1 IMPLANT
STRIP CLOSURE SKIN 1/2X4 (GAUZE/BANDAGES/DRESSINGS) ×2 IMPLANT
SUT ETHILON 3-0 FS-10 30 BLK (SUTURE) ×3
SUT ETHILON 4-0 (SUTURE)
SUT ETHILON 4-0 FS2 18XMFL BLK (SUTURE)
SUT VIC AB 2-0 CT1 (SUTURE) ×6 IMPLANT
SUT VIC AB 3-0 SH 27 (SUTURE) ×4
SUT VIC AB 3-0 SH 27X BRD (SUTURE) ×2 IMPLANT
SUT VIC AB 4-0 FS2 27 (SUTURE) ×3 IMPLANT
SUT VICRYL+ 3-0 144IN (SUTURE) IMPLANT
SUTURE EHLN 3-0 FS-10 30 BLK (SUTURE) ×1 IMPLANT
SUTURE ETHLN 4-0 FS2 18XMF BLK (SUTURE) IMPLANT
SWABSTK COMLB BENZOIN TINCTURE (MISCELLANEOUS) ×3 IMPLANT
SYR CONTROL 10ML (SYRINGE) ×3 IMPLANT

## 2016-05-09 NOTE — H&P (Signed)
No change in clinical history.  Has developed drainage from the area since her  May 01, 2016 exam.

## 2016-05-09 NOTE — Discharge Instructions (Signed)

## 2016-05-09 NOTE — Anesthesia Procedure Notes (Addendum)
Procedure Name: Intubation Date/Time: 05/09/2016 11:18 AM Performed by: Doreen Salvage Pre-anesthesia Checklist: Patient identified, Patient being monitored, Timeout performed, Emergency Drugs available and Suction available Patient Re-evaluated:Patient Re-evaluated prior to inductionOxygen Delivery Method: Circle system utilized Preoxygenation: Pre-oxygenation with 100% oxygen Intubation Type: IV induction Ventilation: Mask ventilation without difficulty Laryngoscope Size: Mac and 3 Grade View: Grade III Tube type: Oral Tube size: 7.0 mm Number of attempts: 1 Airway Equipment and Method: Stylet Placement Confirmation: ETT inserted through vocal cords under direct vision,  positive ETCO2 and breath sounds checked- equal and bilateral Secured at: 21 cm Tube secured with: Tape Dental Injury: Teeth and Oropharynx as per pre-operative assessment  Comments: Anterior airway

## 2016-05-09 NOTE — Anesthesia Preprocedure Evaluation (Addendum)
Anesthesia Evaluation  Patient identified by MRN, date of birth, ID band Patient awake    Reviewed: Allergy & Precautions, H&P , NPO status , Patient's Chart, lab work & pertinent test results, reviewed documented beta blocker date and time   Airway Mallampati: II  TM Distance: >3 FB Neck ROM: full    Dental  (+) Teeth Intact   Pulmonary neg pulmonary ROS,    Pulmonary exam normal        Cardiovascular hypertension, + angina + CAD and + Past MI  negative cardio ROS Normal cardiovascular exam+ dysrhythmias  Rhythm:regular Rate:Normal     Neuro/Psych CVA negative neurological ROS  negative psych ROS   GI/Hepatic negative GI ROS, Neg liver ROS, GERD  ,  Endo/Other  negative endocrine ROS  Renal/GU negative Renal ROS  negative genitourinary   Musculoskeletal   Abdominal   Peds  Hematology negative hematology ROS (+) JEHOVAH'S WITNESS  Anesthesia Other Findings Past Medical History:   Essential hypertension, benign                               Arthritis                                                    Heart disease                                   2001         Hypertension                                    1996         Stroke [facility])                                    2009         Unspecified essential hypertension                           Hearing disorder                                2012           Comment:hearing aids   Reflux                                                         Comment:acid reflux   Breast screening, unspecified                                Lump or mass in breast  Special screening for malignant neoplasms, col*              Other benign neoplasm of connective and other *              Personal history of malignant neoplasm of brea*              Atrial fibrillation (H[facility])                                    Allergy                                                       Atrial fibrillation (H[facility])                                    Atrial fibrillation with rapid ventricular res* November *     Comment:Heart rate up to 140 requiring IV Cardizem   Hip pain                                                     Coronary artery disease                                      Heart attack ([place])                              2001         Anxiety                                                      GERD (gastroesophageal reflux disease)                       Diverticulosis                                               Hyperlipidemia                                               Dysrhythmia                                                  Shortness of breath dyspnea  Comment:with exertion   Breast cancer ([place])                             2012           Comment:Patient underwent a left mastectomy on 03-31-11.               Pathology showed DCIS at both sites without an               invasive component. Sentinel nodes were               negative. The breast lesions were noted to be               ER +40%, PR-positive, 5%.    Malignant neoplasm of upper-inner quadrant of *              Malignant neoplasm of upper-outer quadrant of *              Anginal pain (HCC)                                             Comment:c/o angina on May 04, 2016 releived by               Nitroglycerin X 2 Past Surgical History:   MASTECTOMY                                       2012           Comment:left breast   BREAST BIOPSY                                                   Comment:left breast biopsy over 15 years ago    UPPER GI ENDOSCOPY                               2009           Comment:[facility] Dr. Donnella Sham   COLONOSCOPY                                      2009           Comment:[facility] Dr. Donnella Sham   APPENDECTOMY                                                    Comment:age 25   TONSILLECTOMY                                                    Comment:as a child   CHOLECYSTECTOMY  Comment:40 years ago    ABDOMINAL HYSTERECTOMY                                          Comment:age 61   SMALL INTESTINE SURGERY                          12/31/2012     Comment:Abdominal exploration, lysis of adhesions for               distal small bowel obstruction. Rochel Brome,               MD   EYE SURGERY                                     Bilateral                Comment:Cataract Extraction with IOL   HERNIA REPAIR                                                 COLON SURGERY                                                   Comment:Dr. Tamala Julian, [place]                          Right 02/27/2016       Comment:Procedure: TOTAL HIP ARTHROPLASTY;  Surgeon:               Dereck Leep, MD;  Location: [facility] ORS;                Service: Orthopedics;  Laterality: Right;   JOINT REPLACEMENT                               Right                Comment:TOTAL HIP REPLACEMENT   EXCISION OF BACK LESION                         Left 05/09/2016      Comment:Procedure: EXCISION OF GLUTEAL MASS;  Surgeon:               Robert Bellow, MD;  Location: [facility] ORS;                Service: General;  Laterality: Left; BMI    Body Mass Index   24.00 kg/m 2     Reproductive/Obstetrics negative OB ROS                             Anesthesia Physical Anesthesia Plan  ASA: III  Anesthesia Plan: General ETT   Post-op Pain Management:    Induction:   Airway Management Planned:   Additional Equipment:   Intra-op  Plan:   Post-operative Plan:   Informed Consent: I have reviewed the patients History and Physical, chart, labs and discussed the procedure including the risks, benefits and alternatives for the proposed anesthesia with the patient or authorized representative who has indicated his/her understanding and acceptance.   Dental Advisory Given  Plan  Discussed with: CRNA  Anesthesia Plan Comments:         Anesthesia Quick Evaluation

## 2016-05-09 NOTE — Op Note (Signed)
Preoperative diagnosis: Left gluteal mass.  Postoperative diagnosis: Same.  Operative procedure: Excision of left gluteal mass.  Operating surgeon: Hervey Ard, M.D.  Anesthesia: Gen. endotracheal, Marcaine 0.5%, plain, 20 mL local infiltration.  Estimated blood loss: 20 mL.  Clinical note: This 82 year old woman has developed a painful mass in the left gluteal area. She was evaluated by the wound clinic and referred for debridement. Films from her previous hip surgery showed a calcified mass in the perineal area. Previous CT dating back 2014 show the area had been increasing in size. With her recent weight loss and loss of soft tissue volume the area had become ulcerated and she was admitted for elective excision. The patient received Kefzol prior to the procedure.  Operative note: With the patient under adequate general endotracheal anesthesia she was rolled to the prone position and appropriately padded. The buttocks were taped apart and the presacral area and buttocks prepped with Betadine solution and draped. A curvilinear incision to encompass the draining sinus. The skin was incised sharply and the remaining dissection completed with electrocautery. There was excellent vascularity. The mass extended down to the sacrum and the coccyx. Hemostasis was with electrocautery and 3-0 Vicryl ties. The dominant mass measured 3 x 4 x 6 cm and a small satellite mass measured 1.5 cm. A nylon suture was placed at the superior edge of the specimen for orientation and it was sent in formalin for routine histology.  The previously placed tapes were released and it was found that the sacrum would be exposed. A flap was made over the left gluteal skin extending about 7 cm to allow rotation of the adipose tissue to cover the sacrum. This was approximated to the contralateral side with interrupted 2-0 Vicryl figure-of-eight sutures. The superficial layer of adipose tissue was then approximated to the  contralateral side in a similar fashion. The skin was closed with a running 4-0 Vicryl septic suture. Prior to wound closure a Blake drain was brought out through the left gluteal skin and anchored into position with a 3-0 nylon suture. This was placed into the depths of the wound to minimize seroma or hematoma formation. This was later connected to closed suction. Benzoin, Steri-Strips, Telfa and Tegaderm dressings were applied.  The patient tolerated the procedure well and was taken to recovery in stable condition.

## 2016-05-09 NOTE — Transfer of Care (Signed)
Immediate Anesthesia Transfer of Care Note  Patient: Peggy [name]  Procedure(s) Performed: Procedure(s): EXCISION OF GLUTEAL MASS (Left)  Patient Location: PACU  Anesthesia Type:General  Level of Consciousness: sedated  Airway & Oxygen Therapy: Patient Spontanous Breathing and Patient connected to face mask oxygen  Post-op Assessment: Report given to RN and Post -op Vital signs reviewed and stable  Post vital signs: Reviewed and stable  Last Vitals:  Filed Vitals:   05/09/16 1034 05/09/16 1233  BP: 143/72 133/65  Pulse: 73 81  Temp: 37.1 C 36.1 C  Resp: 18 17    Complications: No apparent anesthesia complications

## 2016-05-11 ENCOUNTER — Emergency Department: Payer: Medicare Other

## 2016-05-11 ENCOUNTER — Inpatient Hospital Stay
Admission: EM | Admit: 2016-05-11 | Discharge: 2016-05-12 | DRG: 310 | Disposition: A | Discharge status: Home / Self Care | Payer: Medicare Other | Attending: Internal Medicine | Admitting: Internal Medicine

## 2016-05-11 DIAGNOSIS — Z9842 Cataract extraction status, left eye: Secondary | ICD-10-CM | POA: Diagnosis not present

## 2016-05-11 DIAGNOSIS — Z79899 Other long term (current) drug therapy: Secondary | ICD-10-CM | POA: Diagnosis not present

## 2016-05-11 DIAGNOSIS — Z803 Family history of malignant neoplasm of breast: Secondary | ICD-10-CM | POA: Diagnosis not present

## 2016-05-11 DIAGNOSIS — Z882 Allergy status to sulfonamides status: Secondary | ICD-10-CM | POA: Diagnosis not present

## 2016-05-11 DIAGNOSIS — Z9012 Acquired absence of left breast and nipple: Secondary | ICD-10-CM | POA: Diagnosis not present

## 2016-05-11 DIAGNOSIS — Z823 Family history of stroke: Secondary | ICD-10-CM | POA: Diagnosis not present

## 2016-05-11 DIAGNOSIS — E785 Hyperlipidemia, unspecified: Secondary | ICD-10-CM | POA: Diagnosis present

## 2016-05-11 DIAGNOSIS — Z9889 Other specified postprocedural states: Secondary | ICD-10-CM | POA: Diagnosis not present

## 2016-05-11 DIAGNOSIS — I480 Paroxysmal atrial fibrillation: Secondary | ICD-10-CM | POA: Diagnosis present

## 2016-05-11 DIAGNOSIS — I252 Old myocardial infarction: Secondary | ICD-10-CM | POA: Diagnosis not present

## 2016-05-11 DIAGNOSIS — Z853 Personal history of malignant neoplasm of breast: Secondary | ICD-10-CM | POA: Diagnosis not present

## 2016-05-11 DIAGNOSIS — Z8673 Personal history of transient ischemic attack (TIA), and cerebral infarction without residual deficits: Secondary | ICD-10-CM | POA: Diagnosis not present

## 2016-05-11 DIAGNOSIS — E739 Lactose intolerance, unspecified: Secondary | ICD-10-CM | POA: Diagnosis present

## 2016-05-11 DIAGNOSIS — Z96641 Presence of right artificial hip joint: Secondary | ICD-10-CM | POA: Diagnosis present

## 2016-05-11 DIAGNOSIS — R000 Tachycardia, unspecified: Secondary | ICD-10-CM | POA: Diagnosis present

## 2016-05-11 DIAGNOSIS — I119 Hypertensive heart disease without heart failure: Secondary | ICD-10-CM | POA: Diagnosis present

## 2016-05-11 DIAGNOSIS — R079 Chest pain, unspecified: Secondary | ICD-10-CM

## 2016-05-11 DIAGNOSIS — Z7982 Long term (current) use of aspirin: Secondary | ICD-10-CM | POA: Diagnosis not present

## 2016-05-11 DIAGNOSIS — Z7901 Long term (current) use of anticoagulants: Secondary | ICD-10-CM | POA: Diagnosis not present

## 2016-05-11 DIAGNOSIS — M1990 Unspecified osteoarthritis, unspecified site: Secondary | ICD-10-CM | POA: Diagnosis present

## 2016-05-11 DIAGNOSIS — R229 Localized swelling, mass and lump, unspecified: Secondary | ICD-10-CM | POA: Diagnosis present

## 2016-05-11 DIAGNOSIS — Z9841 Cataract extraction status, right eye: Secondary | ICD-10-CM | POA: Diagnosis not present

## 2016-05-11 DIAGNOSIS — Z888 Allergy status to other drugs, medicaments and biological substances status: Secondary | ICD-10-CM | POA: Diagnosis not present

## 2016-05-11 DIAGNOSIS — Z8249 Family history of ischemic heart disease and other diseases of the circulatory system: Secondary | ICD-10-CM | POA: Diagnosis not present

## 2016-05-11 DIAGNOSIS — Z961 Presence of intraocular lens: Secondary | ICD-10-CM | POA: Diagnosis present

## 2016-05-11 DIAGNOSIS — K219 Gastro-esophageal reflux disease without esophagitis: Secondary | ICD-10-CM | POA: Diagnosis present

## 2016-05-11 DIAGNOSIS — Z881 Allergy status to other antibiotic agents status: Secondary | ICD-10-CM | POA: Diagnosis not present

## 2016-05-11 DIAGNOSIS — H9190 Unspecified hearing loss, unspecified ear: Secondary | ICD-10-CM | POA: Diagnosis present

## 2016-05-11 DIAGNOSIS — Z974 Presence of external hearing-aid: Secondary | ICD-10-CM | POA: Diagnosis not present

## 2016-05-11 DIAGNOSIS — I2510 Atherosclerotic heart disease of native coronary artery without angina pectoris: Secondary | ICD-10-CM | POA: Diagnosis present

## 2016-05-11 DIAGNOSIS — I4891 Unspecified atrial fibrillation: Secondary | ICD-10-CM | POA: Diagnosis present

## 2016-05-11 LAB — CBC
HCT: 31 % — ABNORMAL LOW (ref 35.0–47.0)
HEMOGLOBIN: 10.2 g/dL — AB (ref 12.0–16.0)
MCH: 30.3 pg (ref 26.0–34.0)
MCHC: 32.8 g/dL (ref 32.0–36.0)
MCV: 92.4 fL (ref 80.0–100.0)
PLATELETS: 376 10*3/uL (ref 150–440)
RBC: 3.36 MIL/uL — AB (ref 3.80–5.20)
RDW: 14 % (ref 11.5–14.5)
WBC: 10.8 10*3/uL (ref 3.6–11.0)

## 2016-05-11 LAB — URINALYSIS COMPLETE WITH MICROSCOPIC (ARMC ONLY)
BILIRUBIN URINE: NEGATIVE
Bacteria, UA: NONE SEEN
GLUCOSE, UA: NEGATIVE mg/dL
HGB URINE DIPSTICK: NEGATIVE
Ketones, ur: NEGATIVE mg/dL
LEUKOCYTES UA: NEGATIVE
NITRITE: NEGATIVE
PH: 8 (ref 5.0–8.0)
Protein, ur: NEGATIVE mg/dL
RBC / HPF: NONE SEEN RBC/hpf (ref 0–5)
SPECIFIC GRAVITY, URINE: 1.006 (ref 1.005–1.030)
Squamous Epithelial / LPF: NONE SEEN

## 2016-05-11 LAB — COMPREHENSIVE METABOLIC PANEL
ALT: 12 U/L — AB (ref 14–54)
AST: 20 U/L (ref 15–41)
Albumin: 3.2 g/dL — ABNORMAL LOW (ref 3.5–5.0)
Alkaline Phosphatase: 68 U/L (ref 38–126)
Anion gap: 7 (ref 5–15)
BUN: 26 mg/dL — AB (ref 6–20)
CHLORIDE: 108 mmol/L (ref 101–111)
CO2: 25 mmol/L (ref 22–32)
CREATININE: 0.8 mg/dL (ref 0.44–1.00)
Calcium: 8.7 mg/dL — ABNORMAL LOW (ref 8.9–10.3)
GFR calc Af Amer: >60 mL/min (ref >60)
Glucose, Bld: 133 mg/dL — ABNORMAL HIGH (ref 65–99)
POTASSIUM: 4 mmol/L (ref 3.5–5.1)
SODIUM: 140 mmol/L (ref 135–145)
Total Bilirubin: 0.2 mg/dL — ABNORMAL LOW (ref 0.3–1.2)
Total Protein: 6.6 g/dL (ref 6.5–8.1)

## 2016-05-11 LAB — PROTIME-INR
INR: 0.9
PROTHROMBIN TIME: 12.4 s (ref 11.4–15.0)

## 2016-05-11 LAB — APTT: APTT: 24 s (ref 24–36)

## 2016-05-11 LAB — TROPONIN I: TROPONIN I: 0.03 ng/mL (ref <0.031)

## 2016-05-11 MED ORDER — ACETAMINOPHEN 325 MG PO TABS: 325.0000 mg | ORAL_TABLET | Freq: Four times a day (QID) | ORAL | Status: DC | PRN

## 2016-05-11 MED ORDER — SODIUM CHLORIDE 0.9 % IV BOLUS (SEPSIS)
500.0000 mL | Freq: Once | INTRAVENOUS | Status: AC
  Administered 2016-05-11: 500 mL via INTRAVENOUS

## 2016-05-11 MED ORDER — ESCITALOPRAM OXALATE 10 MG PO TABS
5.0000 mg | ORAL_TABLET | Freq: Every day | ORAL | Status: DC
Start: 2016-05-12 — End: 2016-05-12
  Administered 2016-05-12: 5 mg via ORAL
  Filled 2016-05-11: qty 1

## 2016-05-11 MED ORDER — DILTIAZEM HCL 25 MG/5ML IV SOLN
10.0000 mg | Freq: Once | INTRAVENOUS | Status: AC
  Administered 2016-05-11: 10 mg via INTRAVENOUS
  Filled 2016-05-11: qty 5

## 2016-05-11 MED ORDER — NITROGLYCERIN 0.4 MG SL SUBL: 0.4000 mg | SUBLINGUAL_TABLET | SUBLINGUAL | Status: DC | PRN

## 2016-05-11 MED ORDER — METOPROLOL TARTRATE 25 MG PO TABS
25.0000 mg | ORAL_TABLET | Freq: Two times a day (BID) | ORAL | Status: DC
  Administered 2016-05-11 – 2016-05-12 (×2): 25 mg via ORAL
  Filled 2016-05-11 (×2): qty 1

## 2016-05-11 MED ORDER — ACETAMINOPHEN 650 MG RE SUPP: 650.0000 mg | Freq: Four times a day (QID) | RECTAL | Status: DC | PRN

## 2016-05-11 MED ORDER — TROLAMINE SALICYLATE 10 % EX CREA
TOPICAL_CREAM | CUTANEOUS | Status: DC | PRN
  Filled 2016-05-11: qty 85

## 2016-05-11 MED ORDER — DILTIAZEM HCL 100 MG IV SOLR
5.0000 mg/h | INTRAVENOUS | Status: DC
  Administered 2016-05-11: 7.5 mg/h via INTRAVENOUS
  Administered 2016-05-11: 5 mg/h via INTRAVENOUS
  Administered 2016-05-12: 10 mg/h via INTRAVENOUS
  Administered 2016-05-12: 12.5 mg/h via INTRAVENOUS
  Administered 2016-05-12: 10 mg/h via INTRAVENOUS
  Filled 2016-05-11 (×2): qty 100

## 2016-05-11 MED ORDER — ONDANSETRON HCL 4 MG/2ML IJ SOLN: 4.0000 mg | Freq: Four times a day (QID) | INTRAMUSCULAR | Status: DC | PRN

## 2016-05-11 MED ORDER — RISAQUAD PO CAPS
ORAL_CAPSULE | Freq: Every day | ORAL | Status: DC
  Administered 2016-05-12: 1 via ORAL
  Filled 2016-05-11: qty 1

## 2016-05-11 MED ORDER — ONDANSETRON HCL 4 MG PO TABS: 4.0000 mg | ORAL_TABLET | Freq: Four times a day (QID) | ORAL | Status: DC | PRN

## 2016-05-11 MED ORDER — LORATADINE 10 MG PO TABS
10.0000 mg | ORAL_TABLET | Freq: Every day | ORAL | Status: DC
  Administered 2016-05-12: 10 mg via ORAL
  Filled 2016-05-11: qty 1

## 2016-05-11 MED ORDER — DILTIAZEM HCL ER COATED BEADS 180 MG PO CP24
180.0000 mg | ORAL_CAPSULE | Freq: Every day | ORAL | Status: DC
  Administered 2016-05-12: 180 mg via ORAL
  Filled 2016-05-11: qty 1

## 2016-05-11 MED ORDER — SODIUM CHLORIDE 0.9 % IV SOLN
Freq: Once | INTRAVENOUS | Status: AC
  Administered 2016-05-11: 20:00:00 via INTRAVENOUS

## 2016-05-11 MED ORDER — ALPRAZOLAM 0.25 MG PO TABS
0.2500 mg | ORAL_TABLET | Freq: Every evening | ORAL | Status: DC | PRN
  Administered 2016-05-11: 0.25 mg via ORAL
  Filled 2016-05-11: qty 1

## 2016-05-11 MED ORDER — VITAMIN B-12 1000 MCG PO TABS
1000.0000 ug | ORAL_TABLET | Freq: Every day | ORAL | Status: DC
  Administered 2016-05-12: 1000 ug via ORAL
  Filled 2016-05-11: qty 1

## 2016-05-11 MED ORDER — ASPIRIN EC 81 MG PO TBEC
81.0000 mg | DELAYED_RELEASE_TABLET | Freq: Every day | ORAL | Status: DC
  Administered 2016-05-12: 81 mg via ORAL
  Filled 2016-05-11: qty 1

## 2016-05-11 MED ORDER — SODIUM CHLORIDE 0.9 % IV SOLN: Freq: Once | INTRAVENOUS | Status: DC

## 2016-05-11 MED ORDER — ENOXAPARIN SODIUM 40 MG/0.4ML ~~LOC~~ SOLN: 40.0000 mg | SUBCUTANEOUS | Status: DC

## 2016-05-11 MED ORDER — DOCUSATE SODIUM 100 MG PO CAPS: 100.0000 mg | ORAL_CAPSULE | Freq: Every day | ORAL | Status: DC | PRN

## 2016-05-11 MED ORDER — DOCUSATE SODIUM 100 MG PO CAPS
100.0000 mg | ORAL_CAPSULE | Freq: Two times a day (BID) | ORAL | Status: DC
  Administered 2016-05-11: 100 mg via ORAL
  Filled 2016-05-11: qty 1

## 2016-05-11 MED ORDER — DILTIAZEM HCL 25 MG/5ML IV SOLN
20.0000 mg | Freq: Once | INTRAVENOUS | Status: AC
  Administered 2016-05-11: 20 mg via INTRAVENOUS
  Filled 2016-05-11: qty 5

## 2016-05-11 MED ORDER — PANTOPRAZOLE SODIUM 40 MG PO TBEC
40.0000 mg | DELAYED_RELEASE_TABLET | Freq: Every day | ORAL | Status: DC
  Administered 2016-05-12: 40 mg via ORAL
  Filled 2016-05-11: qty 1

## 2016-05-11 MED ORDER — METOPROLOL TARTRATE 5 MG/5ML IV SOLN
5.0000 mg | Freq: Once | INTRAVENOUS | Status: AC
  Administered 2016-05-11: 5 mg via INTRAVENOUS
  Filled 2016-05-11: qty 5

## 2016-05-11 MED ORDER — MENTHOL (TOPICAL ANALGESIC) 2.5 % EX GEL: CUTANEOUS | Status: DC | PRN

## 2016-05-11 MED ORDER — PANTOPRAZOLE SODIUM 20 MG PO TBEC: 20.0000 mg | DELAYED_RELEASE_TABLET | Freq: Every day | ORAL | Status: DC

## 2016-05-11 MED ORDER — CALCIUM CARBONATE-VITAMIN D 500-200 MG-UNIT PO TABS
1.0000 | ORAL_TABLET | Freq: Two times a day (BID) | ORAL | Status: DC
  Administered 2016-05-11 – 2016-05-12 (×2): 1 via ORAL
  Filled 2016-05-11 (×2): qty 1

## 2016-05-11 MED ORDER — SODIUM CHLORIDE 0.9% FLUSH
3.0000 mL | Freq: Two times a day (BID) | INTRAVENOUS | Status: DC
  Administered 2016-05-12 (×2): 3 mL via INTRAVENOUS

## 2016-05-11 MED ORDER — TRAMADOL HCL 50 MG PO TABS
50.0000 mg | ORAL_TABLET | ORAL | Status: DC | PRN
  Administered 2016-05-11: 50 mg via ORAL
  Filled 2016-05-11: qty 1

## 2016-05-11 MED ORDER — ADULT MULTIVITAMIN W/MINERALS CH
ORAL_TABLET | Freq: Every day | ORAL | Status: DC
  Administered 2016-05-12: 1 via ORAL
  Filled 2016-05-11: qty 1

## 2016-05-11 MED ORDER — OCUVITE-LUTEIN PO CAPS
1.0000 | ORAL_CAPSULE | Freq: Two times a day (BID) | ORAL | Status: DC
  Administered 2016-05-11 – 2016-05-12 (×2): 1 via ORAL
  Filled 2016-05-11 (×2): qty 1

## 2016-05-11 MED ORDER — MIRTAZAPINE 15 MG PO TABS
15.0000 mg | ORAL_TABLET | Freq: Every day | ORAL | Status: DC
  Administered 2016-05-11: 15 mg via ORAL
  Filled 2016-05-11: qty 1

## 2016-05-11 MED ORDER — ACETAMINOPHEN 325 MG PO TABS: 650.0000 mg | ORAL_TABLET | Freq: Four times a day (QID) | ORAL | Status: DC | PRN

## 2016-05-11 NOTE — H&P (Addendum)
[facility] NAME: Ashey Krolak    MR#:  [id number]  DATE OF BIRTH:  12/12/1928  DATE OF ADMISSION:  05/11/2016  PRIMARY CARE PHYSICIAN: Maryland Pink, MD   REQUESTING/REFERRING PHYSICIAN: Dr. Jimmye Norman  CHIEF COMPLAINT:  Chest pain  HISTORY OF PRESENT ILLNESS:  Daphna Filar  is a 82 y.o. female with a known history of essential hypertension, hyperlipidemia, GERD and recent history of left gluteal mass excision by Dr. Charissa Bash on May 19, currently Coumadin on hold as recommended by surgery and chronic history of atrial fibrillation is presenting to the ED with a chief complaint of chest pain. Chest pain is in the middle of the chest with heart racing. Patient took metoprolol and 3 nitroglycerin which resolved her symptoms. In the ED patient denies any chest pain but patient's heart rate was at around 130s. Patient was given IV Cardizem bolus with no improvement. Denies any palpitations during my examination her first troponin is negative  PAST MEDICAL HISTORY:   Past Medical History  Diagnosis Date  . Essential hypertension, benign   . Arthritis   . Heart disease 2001  . Hypertension 1996  . Stroke [facility]) 2009  . Unspecified essential hypertension   . Hearing disorder 2012    hearing aids  . Reflux     acid reflux  . Breast screening, unspecified   . Lump or mass in breast   . Special screening for malignant neoplasms, colon   . Other benign neoplasm of connective and other soft tissue of thorax   . Personal history of malignant neoplasm of breast   . Atrial fibrillation ([place])   . Allergy   . Atrial fibrillation ([place])   . Atrial fibrillation with rapid ventricular response [facility]) November 2015    Heart rate up to 140 requiring IV Cardizem  . Hip pain   . Coronary artery disease   . Heart attack ([place]) 2001  . Anxiety   . GERD (gastroesophageal reflux disease)   . Diverticulosis   . Hyperlipidemia   . Dysrhythmia    . Shortness of breath dyspnea     with exertion  . Breast cancer [facility]) 2012    Patient underwent a left mastectomy on 03-31-11. Pathology showed DCIS at both sites without an invasive component. Sentinel nodes were negative. The breast lesions were noted to be ER +40%, PR-positive, 5%.   . Malignant neoplasm of upper-inner quadrant of female breast ([place])   . Malignant neoplasm of upper-outer quadrant of female breast ([place])   . Anginal pain ([place])     c/o angina on May 04, 2016 releived by Nitroglycerin X 2    PAST SURGICAL HISTOIRY:   Past Surgical History  Procedure Laterality Date  . Mastectomy  2012    left breast  . Breast biopsy      left breast biopsy over 15 years ago   . Upper gi endoscopy  2009    [facility] Dr. Donnella Sham  . Colonoscopy  2009    [facility] Dr. Donnella Sham  . Appendectomy      age 19  . Tonsillectomy      as a child  . Cholecystectomy      40 years ago   . Abdominal hysterectomy      age 74  . Small intestine surgery  12/31/2012    Abdominal exploration, lysis of adhesions for distal small bowel obstruction. Rochel Brome, MD  . Eye surgery Bilateral  Cataract Extraction with IOL  . Hernia repair    . Colon surgery      Dr. Tamala Julian, [facility]  . Total hip arthroplasty Right 02/27/2016    Procedure: TOTAL HIP ARTHROPLASTY;  Surgeon: Dereck Leep, MD;  Location: [facility] ORS;  Service: Orthopedics;  Laterality: Right;  . Joint replacement Right     TOTAL HIP REPLACEMENT    SOCIAL HISTORY:   Social History  Substance Use Topics  . Smoking status: Never Smoker   . Smokeless tobacco: Never Used  . Alcohol Use: No    FAMILY HISTORY:   Family History  Problem Relation Age of Onset  . Colon cancer Neg Hx   . Ovarian cancer Neg Hx   . Breast cancer Sister     x 2, in their 53's  . Lung cancer Brother   . Stroke Father   . Heart disease Father   . Heart disease Mother     DRUG ALLERGIES:   Allergies  Allergen Reactions  . Alendronate Sodium     REACTION:  questionable  . Amoxicillin-Pot Clavulanate   . Etodolac   . Ibandronate Sodium     REACTION: questionable  . Iodine     Other reaction(s): Unknown  . Lactose Intolerance (Gi) Other (See Comments)    GI problems  . Minocycline Hcl   . Risedronate Sodium   . Sulfonamide Derivatives Other (See Comments)    Stomach pain  . Zoledronic Acid     REACTION: "dental problems"    REVIEW OF SYSTEMS:  CONSTITUTIONAL: No fever, fatigue or weakness.  EYES: No blurred or double vision.  EARS, NOSE, AND THROAT: No tinnitus or ear pain.  RESPIRATORY: No cough, shortness of breath, wheezing or hemoptysis.  CARDIOVASCULAR: Reporting chest pain and palpitations, chest pain resolved after taking sublingual B present, denies orthopnea, edema.  GASTROINTESTINAL: No nausea, vomiting, diarrhea or abdominal pain.  GENITOURINARY: No dysuria, hematuria.  ENDOCRINE: No polyuria, nocturia,  HEMATOLOGY: No anemia, easy bruising or bleeding SKIN: No rash or lesion. MUSCULOSKELETAL: No joint pain or arthritis.   NEUROLOGIC: No tingling, numbness, weakness.  PSYCHIATRY: No anxiety or depression.   MEDICATIONS AT HOME:   Prior to Admission medications   Medication Sig Start Date End Date Taking? Authorizing Provider  acetaminophen (TYLENOL) 325 MG tablet Take 325 mg by mouth every 6 (six) hours as needed for mild pain.     Historical Provider, MD  ALPRAZolam Duanne Moron) 0.25 MG tablet Take 0.25 mg by mouth at bedtime as needed for anxiety or sleep.     Historical Provider, MD  aspirin EC 81 MG tablet Take 81 mg by mouth daily.    Historical Provider, MD  Calcium Carbonate-Vitamin D (RA CALCIUM PLUS VITAMIN D) 600-400 MG-UNIT tablet Take 1 tablet by mouth 2 (two) times daily.    Historical Provider, MD  diltiazem (CARDIZEM CD) 240 MG 24 hr capsule Take 180 mg by mouth daily.  02/16/15   Historical Provider, MD  docusate sodium (COLACE) 100 MG capsule Take 100 mg by mouth daily as needed for mild constipation.     Historical Provider, MD  escitalopram (LEXAPRO) 10 MG tablet Take 5 mg by mouth daily.     Historical Provider, MD  fexofenadine (ALLEGRA) 180 MG tablet Take 180 mg by mouth as needed for allergies or rhinitis.    Historical Provider, MD  lansoprazole (PREVACID) 15 MG capsule Take 30 mg by mouth daily.    Historical Provider, MD  Menthol, Topical Analgesic, (BENGAY VANISHING  SCENT) 2.5 % GEL Apply topically as needed (for pain).     Historical Provider, MD  metoprolol (LOPRESSOR) 50 MG tablet Take 25 mg by mouth 2 (two) times daily.     Historical Provider, MD  mirtazapine (REMERON) 15 MG tablet Take 15 mg by mouth at bedtime.    Historical Provider, MD  Multiple Vitamins-Minerals (CENTRUM SILVER PO) Take 1 tablet by mouth daily.    Historical Provider, MD  Multiple Vitamins-Minerals (PRESERVISION AREDS 2) CAPS Take 1 capsule by mouth 2 (two) times daily.     Historical Provider, MD  nitroGLYCERIN (NITROSTAT) 0.4 MG SL tablet Place 0.4 mg under the tongue every 5 (five) minutes as needed for chest pain.     Historical Provider, MD  Probiotic Product (PROBIOTIC DAILY PO) Take 1 tablet by mouth daily.     Historical Provider, MD  traMADol (ULTRAM) 50 MG tablet Take 1-2 tablets (50-100 mg total) by mouth every 4 (four) hours as needed for moderate pain. 02/28/16   Watt Climes, PA  vitamin B-12 (CYANOCOBALAMIN) 1000 MCG tablet Take 1,000 mcg by mouth daily.    Historical Provider, MD      VITAL SIGNS:  Blood pressure 125/87, pulse 133, temperature 98.1 F (36.7 C), resp. rate 20, height 4\' 8"  (1.422 m), weight 50.349 kg (111 lb), SpO2 98 %.  PHYSICAL EXAMINATION:  GENERAL:  82 y.o.-year-old patient lying in the bed with no acute distress.  EYES: Pupils equal, round, reactive to light and accommodation. No scleral icterus. Extraocular muscles intact.  HEENT: Head atraumatic, normocephalic. Oropharynx and nasopharynx clear.  NECK:  Supple, no jugular venous distention. No thyroid enlargement, no  tenderness.  LUNGS: Normal breath sounds bilaterally, no wheezing, rales,rhonchi or crepitation. No use of accessory muscles of respiration.  CARDIOVASCULAR: Irregularly irregular, No murmurs, rubs, or gallops.  ABDOMEN: Soft, nontender, nondistended. Bowel sounds present. No organomegaly or mass.  EXTREMITIES: Left gluteal area is tender, intact drain with serosanguineous discharge No pedal edema, cyanosis, or clubbing.  NEUROLOGIC: Cranial nerves II through XII are intact. Muscle strength 5/5 in all extremities. Sensation intact. Gait not checked.  PSYCHIATRIC: The patient is alert and oriented x 3.  SKIN: No obvious rash, lesion, or ulcer.   LABORATORY PANEL:   CBC  Recent Labs Lab 05/11/16 1742  WBC 10.8  HGB 10.2*  HCT 31.0*  PLT 376   ------------------------------------------------------------------------------------------------------------------  Chemistries   Recent Labs Lab 05/11/16 1742  NA 140  K 4.0  CL 108  CO2 25  GLUCOSE 133*  BUN 26*  CREATININE 0.80  CALCIUM 8.7*  AST 20  ALT 12*  ALKPHOS 68  BILITOT 0.2*   ------------------------------------------------------------------------------------------------------------------  Cardiac Enzymes  Recent Labs Lab 05/11/16 1742  TROPONINI 0.03   ------------------------------------------------------------------------------------------------------------------  RADIOLOGY:  Dg Chest Port 1 View  05/11/2016  CLINICAL DATA:  82 year old presenting with acute onset of chest pain while sweeping her walkway at home earlier today. Pain was relieved with nitroglycerin. Prior MI. EXAM: PORTABLE CHEST 1 VIEW COMPARISON:  03/03/2016, 01/15/2016 and earlier. FINDINGS: Cardiac silhouette mildly enlarged for AP portable technique, unchanged. Lungs clear. Pulmonary vascularity normal. No visible pleural effusions. Slight deviation of the trachea to the right is likely due to the tortuous aorta. IMPRESSION: Stable mild  cardiomegaly.  No acute cardiopulmonary disease. Electronically Signed   By: Evangeline Dakin M.D.   On: 05/11/2016 18:26    EKG:   Orders placed or performed during the hospital encounter of 05/11/16  . ED EKG  .  ED EKG  . ED EKG  . ED EKG    IMPRESSION AND PLAN:   Desrae Ising  is a 82 y.o. female with a known history of essential hypertension, hyperlipidemia, GERD and recent history of left gluteal mass excision by Dr. Charissa Bash on May 19, currently Coumadin on hold as recommended by surgery and chronic history of atrial fibrillation is presenting to the ED with a chief complaint of chest pain. Chest pain is in the middle of the chest with heart racing. Patient took metoprolol and 3 nitroglycerin which resolved her symptoms. In the ED patient denies any chest pain but patient's heart rate was at around 130s.   #Chest pain rule out acute MI could be from A. fib with RVR Admit to telemetry Initial troponin is negative, cycle cardiac biomarkers and monitor patient on telemetry Continue her home medications aspirin, nitroglycerin as needed  #Atrial fibrillation with RVR/sinus tachycardia Hydrate patient with IV fluids Patient doesn't seem to be anxious Coumadin is on hold as patient recently had surgery on the left gluteal area Cardizem IV as needed and start the patient on Cardizem infusion if heart rate is not well controlled as needed Cardiac consult is placed to call ski Check TSH    #Recent left gluteal mass excision on May 19 Drain with serosanguineous liquid Pain management as needed Continue to hold Coumadin as recommended by surgery Dr. Charissa Bash and consult Dr. Charissa Bash regarding postop care and plan to resume Coumadin   #hypertension continue home medication metoprolol and Cardizem and titrate as needed basis  #GERD-PPI    All the records are reviewed and case discussed with ED provider. Management plans discussed with the patient, family and they are in  agreement.  CODE STATUS: Full code, daughter is the healthcare power of attorney  TOTAL critical care TIME TAKING CARE OF THIS PATIENT: 50  minutes.    Nicholes Mango M.D on 05/11/2016 at 9:10 PM  Between 7am to 6pm - Pager - [phone]  After 6pm go to [email or web address] - password [email or web address] [facility]  [facility]  Office  [phone]  CC: Primary care physician; Maryland Pink, MD

## 2016-05-11 NOTE — ED Notes (Signed)
Pt came to ED via EMS c/o chest pain. Pt reports chest pain began while pt was sweeping walkway. Pt reports feeling a a full pain in the center of her chest. Pt has history of MI. HR 133 upon arrival. Pt took 1 metoporol and 3 nitro before arriving. Pt reports pain is completely gone currently.

## 2016-05-11 NOTE — ED Provider Notes (Signed)
[facility] Emergency Department Provider Note        Time seen: ----------------------------------------- 5:43 PM on 05/11/2016 -----------------------------------------    I have reviewed the triage vital signs and the nursing notes.   HISTORY  Chief Complaint No chief complaint on file.    HPI Peggy [name] is a 82 y.o. female who presents to ER for chest pain that occurred acutely prior to arrival. Patient describes it is in the center of her chest, her heart was racing as well. She took metoprolol and 3 nitroglycerin which is resolved her symptoms. She doesn't history of atrial fibrillation and fast heartbeat. Dr. Ubaldo Glassing is her cardiologist. She did not have any other associated symptoms.   Past Medical History  Diagnosis Date  . Essential hypertension, benign   . Arthritis   . Heart disease 2001  . Hypertension 1996  . Stroke [facility]) 2009  . Unspecified essential hypertension   . Hearing disorder 2012    hearing aids  . Reflux     acid reflux  . Breast screening, unspecified   . Lump or mass in breast   . Special screening for malignant neoplasms, colon   . Other benign neoplasm of connective and other soft tissue of thorax   . Personal history of malignant neoplasm of breast   . Atrial fibrillation ([place])   . Allergy   . Atrial fibrillation ([place])   . Atrial fibrillation with rapid ventricular response [facility]) November 2015    Heart rate up to 140 requiring IV Cardizem  . Hip pain   . Coronary artery disease   . Heart attack ([place]) 2001  . Anxiety   . GERD (gastroesophageal reflux disease)   . Diverticulosis   . Hyperlipidemia   . Dysrhythmia   . Shortness of breath dyspnea     with exertion  . Breast cancer [facility]) 2012    Patient underwent a left mastectomy on 03-31-11. Pathology showed DCIS at both sites without an invasive component. Sentinel nodes were negative. The breast lesions were noted to be ER +40%, PR-positive, 5%.   .  Malignant neoplasm of upper-inner quadrant of female breast ([place])   . Malignant neoplasm of upper-outer quadrant of female breast ([place])   . Anginal pain ([place])     c/o angina on May 04, 2016 releived by Nitroglycerin X 2    Patient Active Problem List   Diagnosis Date Noted  . Localized skin mass, lump, or swelling 04/14/2016  . Abscess of buttock, left 04/07/2016  . S/P total hip arthroplasty 02/27/2016  . Hip pain, acute 01/15/2016  . Hip pain 01/15/2016  . Memory loss 03/21/2015  . Abnormality of gait 03/21/2015  . Chest pain 03/11/2015  . Atrial fibrillation with RVR ([place]) 03/11/2015  . H/O: CVA (cerebrovascular accident) 03/11/2015  . 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 Blurred vision 09/05/2013  . 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 EPIGASTRIC PAIN 02/14/2009  . NONSPECIFIC ABN FINDNG RAD&OTH EXAM BILARY TRCT 02/14/2009    Past Surgical History  Procedure Laterality Date  . Mastectomy  2012    left breast  . Breast biopsy      left breast biopsy over 15 years ago   . Upper gi endoscopy  2009    [facility] Dr. Donnella Sham  . Colonoscopy  2009    [facility] Dr. Donnella Sham  . Appendectomy      age 54  . Tonsillectomy  as a child  . Cholecystectomy      40 years ago   . Abdominal hysterectomy      age 55  . Small intestine surgery  12/31/2012    Abdominal exploration, lysis of adhesions for distal small bowel obstruction. Rochel Brome, MD  . Eye surgery Bilateral     Cataract Extraction with IOL  . Hernia repair    . Colon surgery      Dr. Tamala Julian, [facility]  . Total hip arthroplasty Right 02/27/2016    Procedure: TOTAL HIP ARTHROPLASTY;  Surgeon: Dereck Leep, MD;  Location: [facility] ORS;  Service: Orthopedics;  Laterality: Right;  . Joint replacement Right     TOTAL HIP REPLACEMENT    Allergies Alendronate sodium; Amoxicillin-pot clavulanate; Etodolac; Ibandronate sodium; Iodine; Lactose  intolerance (gi); Minocycline hcl; Risedronate sodium; Sulfonamide derivatives; and Zoledronic acid  Social History Social History  Substance Use Topics  . Smoking status: Never Smoker   . Smokeless tobacco: Never Used  . Alcohol Use: No    Review of Systems Constitutional: Negative for fever. Eyes: Negative for visual changes. ENT: Negative for sore throat. Cardiovascular: Positive for chest pain, palpitations Respiratory: Negative for shortness of breath. Gastrointestinal: Negative for abdominal pain, vomiting and diarrhea. Genitourinary: Negative for dysuria. Musculoskeletal: Negative for back pain. Skin: Negative for rash. Neurological: Negative for headaches, focal weakness or numbness.  10-point ROS otherwise negative.  ____________________________________________   PHYSICAL EXAM:  VITAL SIGNS: ED Triage Vitals  Enc Vitals Group     BP --      Pulse --      Resp --      Temp --      Temp src --      SpO2 --      Weight --      Height --      Head Cir --      Peak Flow --      Pain Score --      Pain Loc --      Pain Edu? --      Excl. in [place]? --     Constitutional: Alert and oriented. Well appearing and in no distress. Eyes: Conjunctivae are normal. PERRL. Normal extraocular movements. ENT   Head: Normocephalic and atraumatic.   Nose: No congestion/rhinnorhea.   Mouth/Throat: Mucous membranes are moist.   Neck: No stridor. Cardiovascular: Rapid rate, irregular rhythm. No murmurs, rubs, or gallops. Respiratory: Normal respiratory effort without tachypnea nor retractions. Breath sounds are clear and equal bilaterally. No wheezes/rales/rhonchi. Gastrointestinal: Soft and nontender. Normal bowel sounds Musculoskeletal: Nontender with normal range of motion in all extremities. No lower extremity tenderness nor edema. Neurologic:  Normal speech and language. No gross focal neurologic deficits are appreciated.  Skin:  Skin is warm, dry and intact.  No rash noted. Psychiatric: Mood and affect are normal. Speech and behavior are normal.  ____________________________________________  EKG: Interpreted by me. Tachycardia, rate is 133 bpm, indeterminate PR interval, normal QRS width, normal QT interval. Left axis deviation. Left anterior fascicular block  Repeat EKG reveals indeterminate tachycardia with a rate of 133 bpm, normal QRS width, normal QT interval. LVH, left axis deviation  ____________________________________________  ED COURSE:  Pertinent labs & imaging results that were available during my care of the patient were reviewed by me and considered in my medical decision making (see chart for details). Patient resents to ER with tachycardia, likely in rapid atrial fibrillation and chest pain. We will check cardiac labs and reevaluate.  ____________________________________________    LABS (pertinent positives/negatives)  Labs Reviewed  CBC - Abnormal; Notable for the following:    RBC 3.36 (*)    Hemoglobin 10.2 (*)    HCT 31.0 (*)    All other components within normal limits  COMPREHENSIVE METABOLIC PANEL - Abnormal; Notable for the following:    Glucose, Bld 133 (*)    BUN 26 (*)    Calcium 8.7 (*)    Albumin 3.2 (*)    ALT 12 (*)    Total Bilirubin 0.2 (*)    All other components within normal limits  TROPONIN I  APTT  PROTIME-INR    RADIOLOGY Images were viewed by me  Chest x-ray IMPRESSION: Stable mild cardiomegaly. No acute cardiopulmonary disease.  CRITICAL CARE Performed by: Earleen Newport   Total critical care time: 30 minutes  Critical care time was exclusive of separately billable procedures and treating other patients.  Critical care was necessary to treat or prevent imminent or life-threatening deterioration.  Critical care was time spent personally by me on the following activities: development of treatment plan with patient and/or surrogate as well as nursing, discussions with  consultants, evaluation of patient's response to treatment, examination of patient, obtaining history from patient or surrogate, ordering and performing treatments and interventions, ordering and review of laboratory studies, ordering and review of radiographic studies, pulse oximetry and re-evaluation of patient's condition.  ____________________________________________  FINAL ASSESSMENT AND PLAN  Chest pain, tachycardia  Plan: Patient with labs and imaging as dictated above. Patient's heart beat has been persistently tachycardic, most resembling atrial fibrillation. She has required multiple doses of IV rate control medications without significant improvement in her heart rate.  Recommend observation.   Earleen Newport, MD   Note: This dictation was prepared with Dragon dictation. Any transcriptional errors that result from this process are unintentional   Earleen Newport, MD 05/11/16 [phone]

## 2016-05-12 ENCOUNTER — Encounter: Payer: Self-pay | Admitting: General Surgery

## 2016-05-12 ENCOUNTER — Ambulatory Visit: Payer: Medicare Other | Admitting: General Surgery

## 2016-05-12 LAB — BASIC METABOLIC PANEL
ANION GAP: 5 (ref 5–15)
BUN: 20 mg/dL (ref 6–20)
CHLORIDE: 109 mmol/L (ref 101–111)
CO2: 28 mmol/L (ref 22–32)
CREATININE: 0.74 mg/dL (ref 0.44–1.00)
Calcium: 8.7 mg/dL — ABNORMAL LOW (ref 8.9–10.3)
GFR calc non Af Amer: >60 mL/min (ref >60)
Glucose, Bld: 101 mg/dL — ABNORMAL HIGH (ref 65–99)
Potassium: 4.1 mmol/L (ref 3.5–5.1)
Sodium: 142 mmol/L (ref 135–145)

## 2016-05-12 LAB — CBC
HEMATOCRIT: 30.7 % — AB (ref 35.0–47.0)
HEMOGLOBIN: 9.8 g/dL — AB (ref 12.0–16.0)
MCH: 29.8 pg (ref 26.0–34.0)
MCHC: 31.8 g/dL — AB (ref 32.0–36.0)
MCV: 93.5 fL (ref 80.0–100.0)
Platelets: 358 10*3/uL (ref 150–440)
RBC: 3.28 MIL/uL — ABNORMAL LOW (ref 3.80–5.20)
RDW: 14.1 % (ref 11.5–14.5)
WBC: 11.1 10*3/uL — ABNORMAL HIGH (ref 3.6–11.0)

## 2016-05-12 LAB — TSH: TSH: 1.962 u[IU]/mL (ref 0.350–4.500)

## 2016-05-12 LAB — SURGICAL PATHOLOGY

## 2016-05-12 LAB — TROPONIN I
TROPONIN I: 0.03 ng/mL (ref <0.031)
Troponin I: <0.03 ng/mL (ref <0.031)
Troponin I: <0.03 ng/mL (ref <0.031)

## 2016-05-12 MED ORDER — DILTIAZEM HCL 25 MG/5ML IV SOLN: 10.0000 mg | Freq: Four times a day (QID) | INTRAVENOUS | Status: DC | PRN

## 2016-05-12 NOTE — Progress Notes (Signed)
Patient is alert and oriented.  VSS. NSR on monitor.  Ambulate around nursing station, HR controlled.  Removed telemetry and removed IV.  No new Rx.  Family at bedside.  Patient escorted out of hospital via wheelchair by volunteers.

## 2016-05-12 NOTE — Discharge Summary (Signed)
Peggy [name], 82 y.o., DOB 12-Jan-1928, MRN [id number]. Admission date: 05/11/2016 Discharge Date 05/12/2016 Primary MD [place] Pink, MD Admitting Physician Nicholes Mango, MD  Admission Diagnosis  Tachycardia [R00.0] Chest pain, unspecified chest pain type [R07.9]  Discharge Diagnosis   Active Problems:   Atrial fibrillation with RVR ([place]) Central hypertension Arthritis History of CVA Hearing disorder GERD History of gluteal mass status post recent excision        Hospital Course patient is a 82 year old female with known history of essential hypertension hyperlipidemia and GERD and recent history of gluteal mass excised on May 19. Patient was outside in her garden doing some light gardening work when she started experiencing chest pain. She came to the ER and was noted to have a heart rate that was very elevated A. fib with RVR. She has a history of this. Patient had to receive IV Cardizem and placed on Cardizem drip. Now she is converted to sinus rhythm. She was seen by her cardiologist who recommended continuing current regimen that she was on Cardizem and metoprolol. She is doing much better and has no complaints and wants to go home. Related and discharged home.           Consults  cardiology  Significant Tests:  See full reports for all details      Dg Pelvis 1-2 Views  05/06/2016  CLINICAL DATA:  Gluteal mass.  Pain. EXAM: PELVIS - 1-2 VIEW COMPARISON:  01/11/2016.  CT 12/29/2012. FINDINGS: Degenerative changes lumbar spine left hip. Total right hip replacement. Stable calcific densities in the posterior mid gluteal region consistent with dystrophic calcifications . Calcified pelvic densities consistent phleboliths. No bowel distention. IMPRESSION: No acute abnormality identified. No evidence of fracture dislocation. Stable calcific densities noted mid lower gluteal region unchanged from prior exam . Electronically Signed   By: [place]   On: 05/06/2016 08:38    Dg Chest Port 1 View  05/11/2016  CLINICAL DATA:  82 year old presenting with acute onset of chest pain while sweeping her walkway at home earlier today. Pain was relieved with nitroglycerin. Prior MI. EXAM: PORTABLE CHEST 1 VIEW COMPARISON:  03/03/2016, 01/15/2016 and earlier. FINDINGS: Cardiac silhouette mildly enlarged for AP portable technique, unchanged. Lungs clear. Pulmonary vascularity normal. No visible pleural effusions. Slight deviation of the trachea to the right is likely due to the tortuous aorta. IMPRESSION: Stable mild cardiomegaly.  No acute cardiopulmonary disease. Electronically Signed   By: Evangeline Dakin M.D.   On: 05/11/2016 18:26       Today   Subjective:   Peggy [name] feels better denies any complaints was to go home  Objective:   Blood pressure 116/65, pulse 70, temperature 98.6 F (37 C), temperature source Oral, resp. rate 18, height 4\' 8"  (1.422 m), weight 49.125 kg (108 lb 4.8 oz), SpO2 96 %.  .  Intake/Output Summary (Last 24 hours) at 05/12/16 1355 Last data filed at 05/12/16 1009  Gross per 24 hour  Intake    240 ml  Output     10 ml  Net    230 ml    Exam VITAL SIGNS: Blood pressure 116/65, pulse 70, temperature 98.6 F (37 C), temperature source Oral, resp. rate 18, height 4\' 8"  (1.422 m), weight 49.125 kg (108 lb 4.8 oz), SpO2 96 %.  GENERAL:  82 y.o.-year-old patient lying in the bed with no acute distress.  EYES: Pupils equal, round, reactive to light and accommodation. No scleral icterus. Extraocular muscles intact.  HEENT: Head atraumatic,  normocephalic. Oropharynx and nasopharynx clear.  NECK:  Supple, no jugular venous distention. No thyroid enlargement, no tenderness.  LUNGS: Normal breath sounds bilaterally, no wheezing, rales,rhonchi or crepitation. No use of accessory muscles of respiration.  CARDIOVASCULAR: S1, S2 normal. No murmurs, rubs, or gallops.  ABDOMEN: Soft, nontender, nondistended. Bowel sounds present. No organomegaly  or mass.  EXTREMITIES: No pedal edema, cyanosis, or clubbing.  NEUROLOGIC: Cranial nerves II through XII are intact. Muscle strength 5/5 in all extremities. Sensation intact. Gait not checked.  PSYCHIATRIC: The patient is alert and oriented x 3.  SKIN: No obvious rash, lesion, or ulcer.   Data Review     CBC w Diff: Lab Results  Component Value Date   WBC 11.1* 05/12/2016   WBC 8.5 10/25/2014   HGB 9.8* 05/12/2016   HGB 11.2* 10/25/2014   HCT 30.7* 05/12/2016   HCT 36.0 10/25/2014   PLT 358 05/12/2016   PLT 307 10/25/2014   LYMPHOPCT 33 05/05/2016   LYMPHOPCT 38.2 10/25/2014   MONOPCT 11 05/05/2016   MONOPCT 10.1 10/25/2014   EOSPCT 4 05/05/2016   EOSPCT 2.5 10/25/2014   BASOPCT 1 05/05/2016   BASOPCT 1.2 10/25/2014   CMP: Lab Results  Component Value Date   NA 142 05/12/2016   NA 143 10/24/2014   K 4.1 05/12/2016   K 4.0 10/24/2014   CL 109 05/12/2016   CL 109* 10/24/2014   CO2 28 05/12/2016   CO2 26 10/24/2014   BUN 20 05/12/2016   BUN 13 10/24/2014   CREATININE 0.74 05/12/2016   CREATININE 0.73 10/24/2014   PROT 6.6 05/11/2016   PROT 6.4 10/24/2014   ALBUMIN 3.2* 05/11/2016   ALBUMIN 2.9* 10/24/2014   BILITOT 0.2* 05/11/2016   BILITOT 0.7 10/24/2014   ALKPHOS 68 05/11/2016   ALKPHOS 71 10/24/2014   AST 20 05/11/2016   AST 28 10/24/2014   ALT 12* 05/11/2016   ALT 27 10/24/2014  .  Micro Results No results found for this or any previous visit (from the past 240 hour(s)).      Code Status Orders        Start     Ordered   05/11/16 2301  Full code   Continuous     05/11/16 2300    Code Status History    Date Active Date Inactive Code Status Order ID Comments User Context   01/15/2016 11:34 AM 01/16/2016  6:10 PM Full Code [id number]  Saundra Shelling, MD ED   03/11/2015  5:33 PM 03/12/2015  6:42 PM Full Code [id number]  Jonetta Osgood, MD Inpatient          Follow-up Information    Follow up with Teodoro Spray., MD. Go on 05/23/2016.    Specialty:  Cardiology   Why:  follow up on 05/23/16 at 1:45pm    Contact information:   [place] [address] [phone]       Discharge Medications     Medication List    TAKE these medications        acetaminophen 325 MG tablet  Commonly known as:  TYLENOL  Take 325 mg by mouth every 6 (six) hours as needed for mild pain.     ALPRAZolam 0.25 MG tablet  Commonly known as:  XANAX  Take 0.25 mg by mouth at bedtime as needed for anxiety or sleep.     aspirin EC 81 MG tablet  Take 81 mg by mouth daily.     BENGAY VANISHING SCENT 2.5 %  Gel  Generic drug:  Menthol (Topical Analgesic)  Apply topically as needed (for pain).     CENTRUM SILVER PO  Take 1 tablet by mouth daily.     diltiazem 180 MG 24 hr capsule  Commonly known as:  DILACOR XR  Take 180 mg by mouth daily.     docusate sodium 100 MG capsule  Commonly known as:  COLACE  Take 100 mg by mouth daily as needed for mild constipation.     escitalopram 10 MG tablet  Commonly known as:  LEXAPRO  Take 5 mg by mouth daily.     fexofenadine 180 MG tablet  Commonly known as:  ALLEGRA  Take 180 mg by mouth as needed for allergies or rhinitis.     lansoprazole 15 MG capsule  Commonly known as:  PREVACID  Take 30 mg by mouth daily.     metoprolol 50 MG tablet  Commonly known as:  LOPRESSOR  Take 25 mg by mouth 2 (two) times daily.     mirtazapine 15 MG tablet  Commonly known as:  REMERON  Take 15 mg by mouth at bedtime.     nitroGLYCERIN 0.4 MG SL tablet  Commonly known as:  NITROSTAT  Place 0.4 mg under the tongue every 5 (five) minutes as needed for chest pain.     PROBIOTIC DAILY PO  Take 1 tablet by mouth daily.     RA CALCIUM PLUS VITAMIN D 600-400 MG-UNIT tablet  Generic drug:  Calcium Carbonate-Vitamin D  Take 1 tablet by mouth 2 (two) times daily.     traMADol 50 MG tablet  Commonly known as:  ULTRAM  Take 1-2 tablets (50-100 mg total) by mouth every 4 (four) hours as needed for  moderate pain.     vitamin B-12 1000 MCG tablet  Commonly known as:  CYANOCOBALAMIN  Take 1,000 mcg by mouth daily.           Total Time in preparing paper work, data evaluation and todays exam - 35 minutes  Dustin Flock M.D on 05/12/2016 at 1:55 PM  [facility]   Office  [phone]

## 2016-05-12 NOTE — Plan of Care (Signed)
Problem: Consults Goal: Chest Pain Patient Education (See Patient Education module for education specifics.) Outcome: Completed/Met Date Met:  05/12/16 Handouts given to patinent r/t to chest pain  Problem: Phase I Progression Outcomes Goal: Anginal pain relieved Outcome: Completed/Met Date Met:  05/12/16 No pain noted on shift Goal: Voiding-avoid urinary catheter unless indicated Outcome: Completed/Met Date Met:  05/12/16 Patient has been voiding

## 2016-05-12 NOTE — Anesthesia Postprocedure Evaluation (Signed)
Anesthesia Post Note  Patient: Esterlene A Casady  Procedure(s) Performed: Procedure(s) (LRB): EXCISION OF GLUTEAL MASS (Left)  Patient location during evaluation: PACU Anesthesia Type: General Level of consciousness: awake and alert Pain management: pain level controlled Vital Signs Assessment: post-procedure vital signs reviewed and stable Respiratory status: spontaneous breathing, nonlabored ventilation, respiratory function stable and patient connected to nasal cannula oxygen Cardiovascular status: blood pressure returned to baseline and stable Postop Assessment: no signs of nausea or vomiting Anesthetic complications: no    Last Vitals:  Filed Vitals:   05/09/16 1333 05/09/16 1344  BP: 124/52 140/83  Pulse: 77 79  Temp: 36.4 C   Resp: 16 16    Last Pain:  Filed Vitals:   05/09/16 1423  PainSc: Asleep                 Molli Barrows

## 2016-05-12 NOTE — Progress Notes (Addendum)
[facility]                                                                                                                                                                                            Patient Demographics   Peggy [name], is a 82 y.o. female, DOB - 1928-12-13, [id number]  Admit date - 05/11/2016   Admitting Physician Nicholes Mango, MD  Outpatient Primary MD for the patient is [place] Pink, MD   LOS - 1  Subjective:Patient's heart rate improved. Denies any chest pain this morning.     Review of Systems:   CONSTITUTIONAL: No documented fever. No fatigue, weakness. No weight gain, no weight loss.  EYES: No blurry or double vision.  ENT: No tinnitus. No postnasal drip. No redness of the oropharynx.  RESPIRATORY: No cough, no wheeze, no hemoptysis. No dyspnea.  CARDIOVASCULAR: Positive chest pain. No orthopnea. No palpitations. No syncope.  GASTROINTESTINAL: No nausea, no vomiting or diarrhea. No abdominal pain. No melena or hematochezia.  GENITOURINARY: No dysuria or hematuria.  ENDOCRINE: No polyuria or nocturia. No heat or cold intolerance.  HEMATOLOGY: No anemia. No bruising. No bleeding.  INTEGUMENTARY: No rashes. No lesions.  MUSCULOSKELETAL: No arthritis. No swelling. No gout.  NEUROLOGIC: No numbness, tingling, or ataxia. No seizure-type activity.  PSYCHIATRIC: No anxiety. No insomnia. No ADD.    Vitals:   Filed Vitals:   05/12/16 0058 05/12/16 0143 05/12/16 0435 05/12/16 0749  BP: 119/76 107/67 110/65 105/54  Pulse: 137 136 70 73  Temp:   97.7 F (36.5 C)   TempSrc:   Oral   Resp:      Height:      Weight:      SpO2:   97%     Wt Readings from Last 3 Encounters:  05/11/16 49.125 kg (108 lb 4.8 oz)  05/09/16 48.535 kg (107 lb)  05/05/16 48.535 kg (107 lb)     Intake/Output Summary (Last 24 hours) at 05/12/16 0825 Last data filed at 05/12/16 0435  Gross per 24 hour  Intake      0 ml  Output      10 ml  Net    -10 ml    Physical Exam:   GENERAL: Pleasant-appearing in no apparent distress.  HEAD, EYES, EARS, NOSE AND THROAT: Atraumatic, normocephalic. Extraocular muscles are intact. Pupils equal and reactive to light. Sclerae anicteric. No conjunctival injection. No oro-pharyngeal erythema.  NECK: Supple. There is no jugular venous distention. No bruits, no lymphadenopathy, no thyromegaly.  HEART: Irregularly irregular rhythm,. No murmurs, no rubs, no clicks.  LUNGS: Clear to  auscultation bilaterally. No rales or rhonchi. No wheezes.  ABDOMEN: Soft, flat, nontender, nondistended. Has good bowel sounds. No hepatosplenomegaly appreciated.  EXTREMITIES: No evidence of any cyanosis, clubbing, or peripheral edema.  +2 pedal and radial pulses bilaterally.  NEUROLOGIC: The patient is alert, awake, and oriented x3 with no focal motor or sensory deficits appreciated bilaterally.  SKIN: Moist and warm with no rashes appreciated.  Psych: Not anxious, depressed LN: No inguinal LN enlargement    Antibiotics   Anti-infectives    None      Medications   Scheduled Meds: . acidophilus   Oral Daily  . aspirin EC  81 mg Oral Daily  . calcium-vitamin D  1 tablet Oral BID  . diltiazem  180 mg Oral Daily  . docusate sodium  100 mg Oral BID  . enoxaparin (LOVENOX) injection  40 mg Subcutaneous Q24H  . escitalopram  5 mg Oral Daily  . loratadine  10 mg Oral Daily  . metoprolol  25 mg Oral BID  . mirtazapine  15 mg Oral QHS  . multivitamin with minerals   Oral Daily  . multivitamin-lutein  1 capsule Oral BID  . pantoprazole  40 mg Oral Daily  . sodium chloride flush  3 mL Intravenous Q12H  . vitamin B-12  1,000 mcg Oral Daily   Continuous Infusions:  PRN Meds:.acetaminophen **OR** acetaminophen, ALPRAZolam, diltiazem, docusate sodium, nitroGLYCERIN, ondansetron **OR** ondansetron (ZOFRAN) IV, traMADol, trolamine salicylate   Data Review:   Micro Results No results found for this  or any previous visit (from the past 240 hour(s)).  Radiology Reports Dg Pelvis 1-2 Views  05/06/2016  CLINICAL DATA:  Gluteal mass.  Pain. EXAM: PELVIS - 1-2 VIEW COMPARISON:  01/11/2016.  CT 12/29/2012. FINDINGS: Degenerative changes lumbar spine left hip. Total right hip replacement. Stable calcific densities in the posterior mid gluteal region consistent with dystrophic calcifications . Calcified pelvic densities consistent phleboliths. No bowel distention. IMPRESSION: No acute abnormality identified. No evidence of fracture dislocation. Stable calcific densities noted mid lower gluteal region unchanged from prior exam . Electronically Signed   By: [place]   On: 05/06/2016 08:38   Dg Chest Port 1 View  05/11/2016  CLINICAL DATA:  82 year old presenting with acute onset of chest pain while sweeping her walkway at home earlier today. Pain was relieved with nitroglycerin. Prior MI. EXAM: PORTABLE CHEST 1 VIEW COMPARISON:  03/03/2016, 01/15/2016 and earlier. FINDINGS: Cardiac silhouette mildly enlarged for AP portable technique, unchanged. Lungs clear. Pulmonary vascularity normal. No visible pleural effusions. Slight deviation of the trachea to the right is likely due to the tortuous aorta. IMPRESSION: Stable mild cardiomegaly.  No acute cardiopulmonary disease. Electronically Signed   By: Evangeline Dakin M.D.   On: 05/11/2016 18:26     CBC  Recent Labs Lab 05/05/16 1542 05/11/16 1742 05/12/16 0518  WBC 8.4 10.8 11.1*  HGB 10.4* 10.2* 9.8*  HCT 31.3* 31.0* 30.7*  PLT 388 376 358  MCV 92.2 92.4 93.5  MCH 30.6 30.3 29.8  MCHC 33.2 32.8 31.8*  RDW 14.1 14.0 14.1  LYMPHSABS 2.7  --   --   MONOABS 0.9  --   --   EOSABS 0.3  --   --   BASOSABS 0.1  --   --     Chemistries   Recent Labs Lab 05/11/16 1742 05/12/16 0518  NA 140 142  K 4.0 4.1  CL 108 109  CO2 25 28  GLUCOSE 133* 101*  BUN 26* 20  CREATININE 0.80 0.74  CALCIUM 8.7* 8.7*  AST 20  --   ALT 12*  --    ALKPHOS 68  --   BILITOT 0.2*  --    ------------------------------------------------------------------------------------------------------------------ estimated creatinine clearance is 33.6 mL/min (by C-G formula based on Cr of 0.74). ------------------------------------------------------------------------------------------------------------------ No results for input(s): HGBA1C in the last 72 hours. ------------------------------------------------------------------------------------------------------------------ No results for input(s): CHOL, HDL, LDLCALC, TRIG, CHOLHDL, LDLDIRECT in the last 72 hours. ------------------------------------------------------------------------------------------------------------------  Recent Labs  05/12/16 0518  TSH 1.962   ------------------------------------------------------------------------------------------------------------------ No results for input(s): VITAMINB12, FOLATE, FERRITIN, TIBC, IRON, RETICCTPCT in the last 72 hours.  Coagulation profile  Recent Labs Lab 05/05/16 1542 05/11/16 1742  INR 1.52 0.90    No results for input(s): DDIMER in the last 72 hours.  Cardiac Enzymes  Recent Labs Lab 05/11/16 1742 05/11/16 2317 05/12/16 0518  TROPONINI 0.03 <0.03 <0.03   ------------------------------------------------------------------------------------------------------------------ Invalid input(s): POCBNP    Assessment & Plan  Peggy [name] is a 82 y.o. female with a known history of essential hypertension, hyperlipidemia, GERD and recent history of left gluteal mass excision by Dr. Charissa Bash on May 19,Presented with chest pain  1.Chest pain  Due to atrial fibrillation with rapid ventricular rate cardiac enzymes remain negative   2.Atrial fibrillation with RVR/sinus tachycardia Suspect this is related to recent surgery. Heart rate improved I will discontinue her IV Cardizem drip Continue oral Cardizem and metoprolol Use  when necessary IV Cardizem TSH normal Her cardiologist this consult.  3.Recent left gluteal mass excision on May 19 Drain with serosanguineous liquid Pain management as needed Continue to hold Coumadin as recommended by surgery Dr. Charissa Bash  F/u out patient as scheduled before  4.hypertension continue  metoprolol and Cardizem blood pressure currently stable  5. GERD- continue Protonix  6. DVT prophylaxis with lovenox     Code Status Orders        Start     Ordered   05/11/16 2301  Full code   Continuous     05/11/16 2300    Code Status History    Date Active Date Inactive Code Status Order ID Comments User Context   01/15/2016 11:34 AM 01/16/2016  6:10 PM Full Code [id number]  Saundra Shelling, MD ED   03/11/2015  5:33 PM 03/12/2015  6:42 PM Full Code [id number]  Jonetta Osgood, MD Inpatient           Consults  Cardiology DVT Prophylaxis  Lovenox   Lab Results  Component Value Date   PLT 358 05/12/2016     Time Spent in minutes   56min  Greater than 50% of time spent in care coordination and counseling patient regarding the condition and plan of care.   Dustin Flock M.D on 05/12/2016 at 8:25 AM  Between 7am to 6pm - Pager - [phone]  After 6pm go to [email or web address] - password [email or web address] [facility]   Office  [phone]

## 2016-05-12 NOTE — Progress Notes (Signed)
AVSS. Minimal pain from surgical site. Low volume drainage reported at home.  10 cc/ shift here. Wound: Clean. Skin edges healthy. Drain site clear. Redressed w/ gauze and tegaderm. Will arrange for OV as outpatient on Thursday.

## 2016-05-12 NOTE — Discharge Instructions (Signed)

## 2016-05-12 NOTE — Consult Note (Signed)
[place]  CARDIOLOGY CONSULT NOTE  Patient ID: Peggy [name] MRN: [id number] DOB/AGE: Apr 07, 1928 82 y.o.  Admit date: 05/11/2016 Referring Physician Dr. Posey Pronto Primary Physician   Primary Cardiologist Dr. Ubaldo Glassing Reason for Consultation afib with rvr  HPI: Pt is a 82 yo female with history of paroxysmal afib who recently underwent escision of lesion on her buttock. Post op was doing well until day of admission , she was doing some very light yardwork and developed afib with rvr. She had been off of her warfarin for her surgery but reports compliance with her cardizem and metoprolol. She was noted to be in afib with rvr in the er and was given iv cardizem. She has conveted to nsr. She has ruled out for mi and is stable in nsr and back to baseline. Desires to go hoome.   Review of Systems  Constitutional: Negative.   HENT: Negative.   Eyes: Negative.   Respiratory: Positive for shortness of breath.   Cardiovascular: Positive for chest pain and palpitations.  Gastrointestinal: Negative.   Genitourinary: Negative.   Musculoskeletal: Positive for myalgias.  Skin: Negative.   Neurological: Negative.   Endo/Heme/Allergies: Negative.   Psychiatric/Behavioral: Negative.     Past Medical History  Diagnosis Date  . Essential hypertension, benign   . Arthritis   . Heart disease 2001  . Hypertension 1996  . Stroke [facility]) 2009  . Unspecified essential hypertension   . Hearing disorder 2012    hearing aids  . Reflux     acid reflux  . Breast screening, unspecified   . Lump or mass in breast   . Special screening for malignant neoplasms, colon   . Other benign neoplasm of connective and other soft tissue of thorax   . Personal history of malignant neoplasm of breast   . Atrial fibrillation ([place])   . Allergy   . Atrial fibrillation ([place])   . Atrial fibrillation with rapid ventricular response [facility]) November 2015    Heart rate up to 140 requiring  IV Cardizem  . Hip pain   . Coronary artery disease   . Heart attack ([place]) 2001  . Anxiety   . GERD (gastroesophageal reflux disease)   . Diverticulosis   . Hyperlipidemia   . Dysrhythmia   . Shortness of breath dyspnea     with exertion  . Breast cancer [facility]) 2012    Patient underwent a left mastectomy on 03-31-11. Pathology showed DCIS at both sites without an invasive component. Sentinel nodes were negative. The breast lesions were noted to be ER +40%, PR-positive, 5%.   . Malignant neoplasm of upper-inner quadrant of female breast ([place])   . Malignant neoplasm of upper-outer quadrant of female breast ([place])   . Anginal pain ([place])     c/o angina on May 04, 2016 releived by Nitroglycerin X 2    Family History  Problem Relation Age of Onset  . Colon cancer Neg Hx   . Ovarian cancer Neg Hx   . Breast cancer Sister     x 2, in their 62's  . Lung cancer Brother   . Stroke Father   . Heart disease Father   . Heart disease Mother     Social History   Social History  . Marital Status: Widowed    Spouse Name: N/A  . Number of Children: 2  . Years of Education: 8   Occupational History  . Retired    Social History Main Topics  .  Smoking status: Never Smoker   . Smokeless tobacco: Never Used  . Alcohol Use: No  . Drug Use: No  . Sexual Activity: Not on file   Other Topics Concern  . Not on file   Social History Narrative   Patient lives at home alone.    Patient has 2 children.    Patient has a 8th education.    Patient is widowed.    Patient is right handed.    Patient is retired.     Past Surgical History  Procedure Laterality Date  . Mastectomy  2012    left breast  . Breast biopsy      left breast biopsy over 15 years ago   . Upper gi endoscopy  2009    [facility] Dr. Donnella Sham  . Colonoscopy  2009    [facility] Dr. Donnella Sham  . Appendectomy      age 16  . Tonsillectomy      as a child  . Cholecystectomy      40 years ago   . Abdominal hysterectomy      age 79  .  Small intestine surgery  12/31/2012    Abdominal exploration, lysis of adhesions for distal small bowel obstruction. Rochel Brome, MD  . Eye surgery Bilateral     Cataract Extraction with IOL  . Hernia repair    . Colon surgery      Dr. Tamala Julian, [facility]  . Total hip arthroplasty Right 02/27/2016    Procedure: TOTAL HIP ARTHROPLASTY;  Surgeon: Dereck Leep, MD;  Location: [facility] ORS;  Service: Orthopedics;  Laterality: Right;  . Joint replacement Right     TOTAL HIP REPLACEMENT  . Excision of back lesion Left 05/09/2016    Procedure: EXCISION OF GLUTEAL MASS;  Surgeon: Robert Bellow, MD;  Location: [facility] ORS;  Service: General;  Laterality: Left;     Prescriptions prior to admission  Medication Sig Dispense Refill Last Dose  . acetaminophen (TYLENOL) 325 MG tablet Take 325 mg by mouth every 6 (six) hours as needed for mild pain.    05/11/2016 at Unknown time  . ALPRAZolam (XANAX) 0.25 MG tablet Take 0.25 mg by mouth at bedtime as needed for anxiety or sleep.    05/11/2016 at Unknown time  . aspirin EC 81 MG tablet Take 81 mg by mouth daily.   05/11/2016 at Unknown time  . Calcium Carbonate-Vitamin D (RA CALCIUM PLUS VITAMIN D) 600-400 MG-UNIT tablet Take 1 tablet by mouth 2 (two) times daily.   05/11/2016 at Unknown time  . diltiazem (DILACOR XR) 180 MG 24 hr capsule Take 180 mg by mouth daily.   05/11/2016 at Unknown time  . docusate sodium (COLACE) 100 MG capsule Take 100 mg by mouth daily as needed for mild constipation.   05/11/2016 at Unknown time  . escitalopram (LEXAPRO) 10 MG tablet Take 5 mg by mouth daily.    05/11/2016 at Unknown time  . fexofenadine (ALLEGRA) 180 MG tablet Take 180 mg by mouth as needed for allergies or rhinitis.   05/11/2016 at Unknown time  . lansoprazole (PREVACID) 15 MG capsule Take 30 mg by mouth daily.   05/11/2016 at Unknown time  . Menthol, Topical Analgesic, (BENGAY VANISHING SCENT) 2.5 % GEL Apply topically as needed (for pain).    05/08/2016 at Unknown time  .  metoprolol (LOPRESSOR) 50 MG tablet Take 25 mg by mouth 2 (two) times daily.    05/11/2016 at Unknown time  . mirtazapine (REMERON) 15 MG  tablet Take 15 mg by mouth at bedtime.   05/11/2016 at Unknown time  . Multiple Vitamins-Minerals (CENTRUM SILVER PO) Take 1 tablet by mouth daily.   05/11/2016 at Unknown time  . nitroGLYCERIN (NITROSTAT) 0.4 MG SL tablet Place 0.4 mg under the tongue every 5 (five) minutes as needed for chest pain.    05/04/2016  . Probiotic Product (PROBIOTIC DAILY PO) Take 1 tablet by mouth daily.    Past Week at Unknown time  . traMADol (ULTRAM) 50 MG tablet Take 1-2 tablets (50-100 mg total) by mouth every 4 (four) hours as needed for moderate pain. 30 tablet 0 05/11/2016 at Unknown time  . vitamin B-12 (CYANOCOBALAMIN) 1000 MCG tablet Take 1,000 mcg by mouth daily.   Past Week at Unknown time    Physical Exam: Blood pressure 116/65, pulse 70, temperature 98.6 F (37 C), temperature source Oral, resp. rate 18, height 4\' 8"  (1.422 m), weight 49.125 kg (108 lb 4.8 oz), SpO2 96 %.   Wt Readings from Last 1 Encounters:  05/11/16 49.125 kg (108 lb 4.8 oz)     General appearance: alert and cooperative Head: Normocephalic, without obvious abnormality, atraumatic Resp: clear to auscultation bilaterally Chest wall: no tenderness Cardio: regular rate and rhythm GI: soft, non-tender; bowel sounds normal; no masses,  no organomegaly Neurologic: Grossly normal  Labs:   Lab Results  Component Value Date   WBC 11.1* 05/12/2016   HGB 9.8* 05/12/2016   HCT 30.7* 05/12/2016   MCV 93.5 05/12/2016   PLT 358 05/12/2016    Recent Labs Lab 05/11/16 1742 05/12/16 0518  NA 140 142  K 4.0 4.1  CL 108 109  CO2 25 28  BUN 26* 20  CREATININE 0.80 0.74  CALCIUM 8.7* 8.7*  PROT 6.6  --   BILITOT 0.2*  --   ALKPHOS 68  --   ALT 12*  --   AST 20  --   GLUCOSE 133* 101*   Lab Results  Component Value Date   CKTOTAL 58 10/24/2014   CKMB 0.7 10/24/2014   TROPONINI 0.03  05/12/2016      Radiology: no chf EKG: afib with rvr initially, currently in nsr  ASSESSMENT AND PLAN:  Pt with history of paroxysmal afib who recently underwent escision of lesion on her buttock. Post op was doing well until day of admission , whe was doing some very light yardwork and developed afib with rvr. She had been off of her warfarin for her surgery but reports compliance with her cardizem and metoprolol. She was noted to be in afib with rvr in the er and was given iv cardizem. She has conveted to nsr. She had chest pain with presentation but has ruled out for mi. She is stable in nsr on metoprolol tartrate bid and cardizem cd. OK to ambulated and discharge from cardiac stanpoint on cardizem and metoprolol at current dose. Remain off coumadin until ok persurgery then resume. Will follow as outpatient in one week.  Signed: Teodoro Spray MD, [facility] 05/12/2016, 12:55 PM

## 2016-05-14 ENCOUNTER — Ambulatory Visit (INDEPENDENT_AMBULATORY_CARE_PROVIDER_SITE_OTHER): Payer: Medicare Other | Admitting: General Surgery

## 2016-05-14 VITALS — BP 128/72 | HR 72 | Resp 15 | Ht <= 58 in | Wt 106.0 lb

## 2016-05-14 DIAGNOSIS — R229 Localized swelling, mass and lump, unspecified: Secondary | ICD-10-CM

## 2016-05-14 NOTE — Progress Notes (Signed)
Patient ID: Peggy [name], female   DOB: [birth year], 82 y.o.   MRN: [id number]  Chief Complaint  Patient presents with  . Routine Post Op    left gluteal mass     HPI Peggy [name] is a 82 y.o. female here today for her follow up left gluteal mass  excision done on 05/09/16. Patient states she is having trouble sitting . She was seen in the hospital on "Sunday for Atrial fibrillation with a rapid ventricular response. Presently asymptomatic.  The patient is accompanied by her daughter, [name].  A personal reviewed the patient's history. HPI  Past Medical History  Diagnosis Date  . Essential hypertension, benign   . Arthritis   . Heart disease 2001  . Hypertension 1996  . Stroke (H[facility]) 2009  . Unspecified essential hypertension   . Hearing disorder 2012    hearing aids  . Reflux     acid reflux  . Breast screening, unspecified   . Lump or mass in breast   . Special screening for malignant neoplasms, colon   . Other benign neoplasm of connective and other soft tissue of thorax   . Personal history of malignant neoplasm of breast   . Atrial fibrillation (HCC)   . Allergy   . Atrial fibrillation (H[facility])   . Atrial fibrillation with rapid ventricular response (H[facility]) November 2015    Heart rate up to 140 requiring IV Cardizem  . Hip pain   . Coronary artery disease   . Heart attack (H[facility]) 2001  . Anxiety   . GERD (gastroesophageal reflux disease)   . Diverticulosis   . Hyperlipidemia   . Dysrhythmia   . Shortness of breath dyspnea     with exertion  . Breast cancer (H[facility]) 2012    Patient underwent a left mastectomy on 03-31-11. Pathology showed DCIS at both sites without an invasive component. Sentinel nodes were negative. The breast lesions were noted to be ER +40%, PR-positive, 5%.   . Malignant neoplasm of upper-inner quadrant of female breast (HCC)   . Malignant neoplasm of upper-outer quadrant of female breast (HCC)   . Anginal pain (H[facility])     c/o angina on May 04, 2016  releived by Nitroglycerin X 2    Past Surgical History  Procedure Laterality Date  . Mastectomy  2012    left breast  . Breast biopsy      left breast biopsy over 15 years ago   . Upper gi endoscopy  2009    [facility] Dr. Skulski  . Colonoscopy  2009    [facility] Dr. Skulski  . Appendectomy      age 18  . Tonsillectomy      as a child  . Cholecystectomy      40"  years ago   . Abdominal hysterectomy      age 66  . Small intestine surgery  12/31/2012    Abdominal exploration, lysis of adhesions for distal small bowel obstruction. Rochel Brome, MD  . Eye surgery Bilateral     Cataract Extraction with IOL  . Hernia repair    . Colon surgery      Dr. Tamala Julian, [facility]  . Total hip arthroplasty Right 02/27/2016    Procedure: TOTAL HIP ARTHROPLASTY;  Surgeon: Dereck Leep, MD;  Location: [facility] ORS;  Service: Orthopedics;  Laterality: Right;  . Joint replacement Right     TOTAL HIP REPLACEMENT  . Excision of back lesion Left 05/09/2016    Procedure: EXCISION OF GLUTEAL  MASS;  Surgeon: Robert Bellow, MD;  Location: [facility] ORS;  Service: General;  Laterality: Left;    Family History  Problem Relation Age of Onset  . Colon cancer Neg Hx   . Ovarian cancer Neg Hx   . Breast cancer Sister     x 2, in their 74's  . Lung cancer Brother   . Stroke Father   . Heart disease Father   . Heart disease Mother     Social History Social History  Substance Use Topics  . Smoking status: Never Smoker   . Smokeless tobacco: Never Used  . Alcohol Use: No    Allergies  Allergen Reactions  . Alendronate Sodium     REACTION: questionable  . Amoxicillin-Pot Clavulanate   . Etodolac   . Ibandronate Sodium     REACTION: questionable  . Iodine     Other reaction(s): Unknown  . Lactose Intolerance (Gi) Other (See Comments)    GI problems  . Minocycline Hcl   . Risedronate Sodium   . Sulfonamide Derivatives Other (See Comments)    Stomach pain  . Zoledronic Acid     REACTION: "dental problems"     Current Outpatient Prescriptions  Medication Sig Dispense Refill  . acetaminophen (TYLENOL) 325 MG tablet Take 325 mg by mouth every 6 (six) hours as needed for mild pain.     Marland Kitchen ALPRAZolam (XANAX) 0.25 MG tablet Take 0.25 mg by mouth at bedtime as needed for anxiety or sleep.     Marland Kitchen aspirin EC 81 MG tablet Take 81 mg by mouth daily.    . Calcium Carbonate-Vitamin D (RA CALCIUM PLUS VITAMIN D) 600-400 MG-UNIT tablet Take 1 tablet by mouth 2 (two) times daily.    Marland Kitchen diltiazem (DILACOR XR) 180 MG 24 hr capsule Take 180 mg by mouth daily.    Marland Kitchen docusate sodium (COLACE) 100 MG capsule Take 100 mg by mouth daily as needed for mild constipation.    Marland Kitchen escitalopram (LEXAPRO) 10 MG tablet Take 5 mg by mouth daily.     . fexofenadine (ALLEGRA) 180 MG tablet Take 180 mg by mouth as needed for allergies or rhinitis.    Marland Kitchen lansoprazole (PREVACID) 15 MG capsule Take 30 mg by mouth daily.    . Menthol, Topical Analgesic, (BENGAY VANISHING SCENT) 2.5 % GEL Apply topically as needed (for pain).     . metoprolol (LOPRESSOR) 50 MG tablet Take 25 mg by mouth 2 (two) times daily.     . mirtazapine (REMERON) 15 MG tablet Take 15 mg by mouth at bedtime.    . Multiple Vitamins-Minerals (CENTRUM SILVER PO) Take 1 tablet by mouth daily.    . nitroGLYCERIN (NITROSTAT) 0.4 MG SL tablet Place 0.4 mg under the tongue every 5 (five) minutes as needed for chest pain.     . Probiotic Product (PROBIOTIC DAILY PO) Take 1 tablet by mouth daily.     . traMADol (ULTRAM) 50 MG tablet Take 1-2 tablets (50-100 mg total) by mouth every 4 (four) hours as needed for moderate pain. 30 tablet 0  . vitamin B-12 (CYANOCOBALAMIN) 1000 MCG tablet Take 1,000 mcg by mouth daily.     No current facility-administered medications for this visit.    Review of Systems Review of Systems  Constitutional: Negative.   Respiratory: Negative.   Cardiovascular: Negative.     Blood pressure 128/72, pulse 72, resp. rate 15, height 4\' 8"  (1.422 m),  weight 106 lb (48.081 kg).  Physical Exam Physical Exam  Examination of the left gluteal wound showed slight separation in the midportion. No loculated fluid. A single 4-0 nylon stitch was placed after ChloraPrep application to minimize the risk for further dehiscence. Remaining suture line appeared intact (subcuticular suture utilized). Drain was removed as volume recordings were less than 10 mL per day .    Data Reviewed A. PRESACRAL MASS, LEFT; EXCISION:  - CIRCUMSCRIBED NODULE OF FAT NECROSIS.  - SKIN AND SUBCUTANEOUS ADIPOSE TISSUE WITH ABSCESS AND DYSTROPHIC  CALCIFICATION, AND FEATURES SUGGESTIVE OF UNDERLYING KERATINIZING CYST.  - NEGATIVE FOR MALIGNANCY.   Assessment    Doing well status post excision of left gluteal mass.    Plan    The patient will resume her Coumadin. Dry dressing to the area (light date peripads in her underwear will be fine) area She may shower.   Patient to return in one week. PCP:  [place] This information has been scribed by Gaspar Cola CMA.   Robert Bellow 05/15/2016, 12:14 PM

## 2016-05-14 NOTE — Patient Instructions (Signed)
Return in one week.  

## 2016-05-22 ENCOUNTER — Encounter: Payer: Self-pay | Admitting: General Surgery

## 2016-05-22 ENCOUNTER — Ambulatory Visit (INDEPENDENT_AMBULATORY_CARE_PROVIDER_SITE_OTHER): Payer: Medicare Other | Admitting: General Surgery

## 2016-05-22 VITALS — BP 116/66 | HR 76 | Resp 12 | Ht <= 58 in | Wt 110.0 lb

## 2016-05-22 DIAGNOSIS — R229 Localized swelling, mass and lump, unspecified: Secondary | ICD-10-CM

## 2016-05-22 NOTE — Patient Instructions (Signed)
Return in two weeks.  

## 2016-05-22 NOTE — Progress Notes (Signed)
Patient ID: Peggy [name], female   DOB: 17-Jun-1928, 82 y.o.   MRN: [id number]  Chief Complaint  Patient presents with  . Follow-up    HPI Peggy [name] is a 82 y.o. female  female here today for her follow up left gluteal mass excision done on 05/09/16.  HPI  Past Medical History  Diagnosis Date  . Essential hypertension, benign   . Arthritis   . Heart disease 2001  . Hypertension 1996  . Stroke [facility]) 2009  . Unspecified essential hypertension   . Hearing disorder 2012    hearing aids  . Reflux     acid reflux  . Breast screening, unspecified   . Lump or mass in breast   . Special screening for malignant neoplasms, colon   . Other benign neoplasm of connective and other soft tissue of thorax   . Personal history of malignant neoplasm of breast   . Atrial fibrillation ([place])   . Allergy   . Atrial fibrillation ([place])   . Atrial fibrillation with rapid ventricular response [facility]) November 2015    Heart rate up to 140 requiring IV Cardizem  . Hip pain   . Coronary artery disease   . Heart attack ([place]) 2001  . Anxiety   . GERD (gastroesophageal reflux disease)   . Diverticulosis   . Hyperlipidemia   . Dysrhythmia   . Shortness of breath dyspnea     with exertion  . Breast cancer [facility]) 2012    Patient underwent a left mastectomy on 03-31-11. Pathology showed DCIS at both sites without an invasive component. Sentinel nodes were negative. The breast lesions were noted to be ER +40%, PR-positive, 5%.   . Malignant neoplasm of upper-inner quadrant of female breast ([place])   . Malignant neoplasm of upper-outer quadrant of female breast ([place])   . Anginal pain ([place])     c/o angina on May 04, 2016 releived by Nitroglycerin X 2    Past Surgical History  Procedure Laterality Date  . Mastectomy  2012    left breast  . Breast biopsy      left breast biopsy over 15 years ago   . Upper gi endoscopy  2009    [facility] Dr. Donnella Sham  . Colonoscopy  2009    [facility] Dr. Donnella Sham  .  Appendectomy      age 89  . Tonsillectomy      as a child  . Cholecystectomy      40 years ago   . Abdominal hysterectomy      age 51  . Small intestine surgery  12/31/2012    Abdominal exploration, lysis of adhesions for distal small bowel obstruction. Rochel Brome, MD  . Eye surgery Bilateral     Cataract Extraction with IOL  . Hernia repair    . Colon surgery      Dr. Tamala Julian, [facility]  . Total hip arthroplasty Right 02/27/2016    Procedure: TOTAL HIP ARTHROPLASTY;  Surgeon: Dereck Leep, MD;  Location: [facility] ORS;  Service: Orthopedics;  Laterality: Right;  . Joint replacement Right     TOTAL HIP REPLACEMENT  . Excision of back lesion Left 05/09/2016    Procedure: EXCISION OF GLUTEAL MASS;  Surgeon: Robert Bellow, MD;  Location: [facility] ORS;  Service: General;  Laterality: Left;    Family History  Problem Relation Age of Onset  . Colon cancer Neg Hx   . Ovarian cancer Neg Hx   . Breast cancer Sister  x 2, in their 60's  . Lung cancer Brother   . Stroke Father   . Heart disease Father   . Heart disease Mother     Social History Social History  Substance Use Topics  . Smoking status: Never Smoker   . Smokeless tobacco: Never Used  . Alcohol Use: No    Allergies  Allergen Reactions  . Alendronate Sodium     REACTION: questionable  . Amoxicillin-Pot Clavulanate   . Etodolac   . Ibandronate Sodium     REACTION: questionable  . Iodine     Other reaction(s): Unknown  . Lactose Intolerance (Gi) Other (See Comments)    GI problems  . Minocycline Hcl   . Risedronate Sodium   . Sulfonamide Derivatives Other (See Comments)    Stomach pain  . Zoledronic Acid     REACTION: "dental problems"    Current Outpatient Prescriptions  Medication Sig Dispense Refill  . acetaminophen (TYLENOL) 325 MG tablet Take 325 mg by mouth every 6 (six) hours as needed for mild pain.     Marland Kitchen ALPRAZolam (XANAX) 0.25 MG tablet Take 0.25 mg by mouth at bedtime as needed for anxiety or sleep.      Marland Kitchen aspirin EC 81 MG tablet Take 81 mg by mouth daily.    . Calcium Carbonate-Vitamin D (RA CALCIUM PLUS VITAMIN D) 600-400 MG-UNIT tablet Take 1 tablet by mouth 2 (two) times daily.    Marland Kitchen diltiazem (DILACOR XR) 180 MG 24 hr capsule Take 180 mg by mouth daily.    Marland Kitchen docusate sodium (COLACE) 100 MG capsule Take 100 mg by mouth daily as needed for mild constipation.    Marland Kitchen escitalopram (LEXAPRO) 10 MG tablet Take 5 mg by mouth daily.     . fexofenadine (ALLEGRA) 180 MG tablet Take 180 mg by mouth as needed for allergies or rhinitis.    Marland Kitchen lansoprazole (PREVACID) 15 MG capsule Take 30 mg by mouth daily.    . Menthol, Topical Analgesic, (BENGAY VANISHING SCENT) 2.5 % GEL Apply topically as needed (for pain).     . metoprolol (LOPRESSOR) 50 MG tablet Take 25 mg by mouth 2 (two) times daily.     . mirtazapine (REMERON) 15 MG tablet Take 15 mg by mouth at bedtime.    . Multiple Vitamins-Minerals (CENTRUM SILVER PO) Take 1 tablet by mouth daily.    . nitroGLYCERIN (NITROSTAT) 0.4 MG SL tablet Place 0.4 mg under the tongue every 5 (five) minutes as needed for chest pain.     . Probiotic Product (PROBIOTIC DAILY PO) Take 1 tablet by mouth daily.     . traMADol (ULTRAM) 50 MG tablet Take 1-2 tablets (50-100 mg total) by mouth every 4 (four) hours as needed for moderate pain. 30 tablet 0  . vitamin B-12 (CYANOCOBALAMIN) 1000 MCG tablet Take 1,000 mcg by mouth daily.     No current facility-administered medications for this visit.    Review of Systems Review of Systems  Constitutional: Negative.   Respiratory: Negative.   Cardiovascular: Negative.     Blood pressure 116/66, pulse 76, resp. rate 12, height 4\' 8"  (1.422 m), weight 110 lb (49.896 kg).  Physical Exam Physical Exam  Constitutional: She is oriented to person, place, and time. She appears well-developed and well-nourished.  Musculoskeletal:       Back:  Neurological: She is alert and oriented to person, place, and time.  Skin: Skin is  warm and dry.      Assessment  The patient is doing well status post excision of a large cyst and an area of fat necrosis from the presacral soft tissue.    Plan    She is asymptomatic, and at this time we'll dispense with any dressing only making use of a covering pad or absorbent undergarments for the small amount of drainage present. She may shower. We will discontinue home health.  My hope is this area will continue to granulate in and not require wound VAC for final closure.   Patient to return in two weeks. Patient not to drive PCP:  [place] This information has been scribed by Gaspar Cola CMA.    Robert Bellow 05/23/2016, 12:55 PM

## 2016-05-27 ENCOUNTER — Telehealth: Payer: Self-pay

## 2016-05-27 NOTE — Telephone Encounter (Signed)
Angie with [facility] called to report that it looks like a suture may have broken in the surgical site. She states that there is a very small shallow open area noted where the suture broke. It appears that this may have happened a couple of days ago. She reports that the area is healing well and looks good. She states no bleeding noted.

## 2016-05-28 ENCOUNTER — Encounter: Payer: Self-pay | Admitting: *Deleted

## 2016-06-04 ENCOUNTER — Ambulatory Visit (INDEPENDENT_AMBULATORY_CARE_PROVIDER_SITE_OTHER): Payer: Medicare Other | Admitting: General Surgery

## 2016-06-04 ENCOUNTER — Encounter: Payer: Self-pay | Admitting: General Surgery

## 2016-06-04 VITALS — BP 138/66 | HR 76 | Resp 14 | Ht <= 58 in | Wt 112.0 lb

## 2016-06-04 DIAGNOSIS — R229 Localized swelling, mass and lump, unspecified: Secondary | ICD-10-CM

## 2016-06-04 NOTE — Patient Instructions (Signed)
The patient is aware to call back for any questions or concerns.  

## 2016-06-04 NOTE — Progress Notes (Signed)
Patient ID: Peggy [name], female   DOB: Aug 12, 1928, 82 y.o.   MRN: [id number]  Chief Complaint  Patient presents with  . Routine Post Op    HPI Peggy [name] is a 82 y.o. female.  here today for her follow up left gluteal mass excision done on 05/09/16. She states the area is still draining some but not as much.Minimal pain.  She thought that she had felt a new mass in the thoracic region and asked to have this assessed today.  She was accompanied by her daughter.  I personally reviewed the patient's history.  HPI  Past Medical History  Diagnosis Date  . Essential hypertension, benign   . Arthritis   . Heart disease 2001  . Hypertension 1996  . Stroke [facility]) 2009  . Unspecified essential hypertension   . Hearing disorder 2012    hearing aids  . Reflux     acid reflux  . Breast screening, unspecified   . Lump or mass in breast   . Special screening for malignant neoplasms, colon   . Other benign neoplasm of connective and other soft tissue of thorax   . Personal history of malignant neoplasm of breast   . Atrial fibrillation ([place])   . Allergy   . Atrial fibrillation ([place])   . Atrial fibrillation with rapid ventricular response [facility]) November 2015    Heart rate up to 140 requiring IV Cardizem  . Hip pain   . Coronary artery disease   . Heart attack ([place]) 2001  . Anxiety   . GERD (gastroesophageal reflux disease)   . Diverticulosis   . Hyperlipidemia   . Dysrhythmia   . Shortness of breath dyspnea     with exertion  . Breast cancer [facility]) 2012    Patient underwent a left mastectomy on 03-31-11. Pathology showed DCIS at both sites without an invasive component. Sentinel nodes were negative. The breast lesions were noted to be ER +40%, PR-positive, 5%.   . Malignant neoplasm of upper-inner quadrant of female breast ([place])   . Malignant neoplasm of upper-outer quadrant of female breast ([place])   . Anginal pain ([place])     c/o angina on May 04, 2016 releived by Nitroglycerin  X 2    Past Surgical History  Procedure Laterality Date  . Mastectomy  2012    left breast  . Breast biopsy      left breast biopsy over 15 years ago   . Upper gi endoscopy  2009    [facility] Dr. Donnella Sham  . Colonoscopy  2009    [facility] Dr. Donnella Sham  . Appendectomy      age 34  . Tonsillectomy      as a child  . Cholecystectomy      40 years ago   . Abdominal hysterectomy      age 56  . Small intestine surgery  12/31/2012    Abdominal exploration, lysis of adhesions for distal small bowel obstruction. Rochel Brome, MD  . Eye surgery Bilateral     Cataract Extraction with IOL  . Hernia repair    . Colon surgery      Dr. [facility]  . Total hip arthroplasty Right 02/27/2016    Procedure: TOTAL HIP ARTHROPLASTY;  Surgeon: Dereck Leep, MD;  Location: [facility] ORS;  Service: Orthopedics;  Laterality: Right;  . Joint replacement Right     TOTAL HIP REPLACEMENT  . Excision of back lesion Left 05/09/2016    Procedure: EXCISION OF GLUTEAL  MASS;  Surgeon: Robert Bellow, MD;  Location: [facility] ORS;  Service: General;  Laterality: Left;    Family History  Problem Relation Age of Onset  . Colon cancer Neg Hx   . Ovarian cancer Neg Hx   . Breast cancer Sister     x 2, in their 64's  . Lung cancer Brother   . Stroke Father   . Heart disease Father   . Heart disease Mother     Social History Social History  Substance Use Topics  . Smoking status: Never Smoker   . Smokeless tobacco: Never Used  . Alcohol Use: No    Allergies  Allergen Reactions  . Alendronate Sodium     REACTION: questionable  . Amoxicillin-Pot Clavulanate   . Etodolac   . Ibandronate Sodium     REACTION: questionable  . Iodine     Other reaction(s): Unknown  . Lactose Intolerance (Gi) Other (See Comments)    GI problems  . Minocycline Hcl   . Risedronate Sodium   . Sulfonamide Derivatives Other (See Comments)    Stomach pain  . Zoledronic Acid     REACTION: "dental problems"    Current Outpatient  Prescriptions  Medication Sig Dispense Refill  . acetaminophen (TYLENOL) 325 MG tablet Take 325 mg by mouth every 6 (six) hours as needed for mild pain.     Marland Kitchen ALPRAZolam (XANAX) 0.25 MG tablet Take 0.25 mg by mouth at bedtime as needed for anxiety or sleep.     Marland Kitchen aspirin EC 81 MG tablet Take 81 mg by mouth daily.    . Calcium Carbonate-Vitamin D (RA CALCIUM PLUS VITAMIN D) 600-400 MG-UNIT tablet Take 1 tablet by mouth 2 (two) times daily.    Marland Kitchen diltiazem (DILACOR XR) 180 MG 24 hr capsule Take 180 mg by mouth daily.    Marland Kitchen docusate sodium (COLACE) 100 MG capsule Take 100 mg by mouth daily as needed for mild constipation.    Marland Kitchen escitalopram (LEXAPRO) 10 MG tablet Take 5 mg by mouth daily.     . fexofenadine (ALLEGRA) 180 MG tablet Take 180 mg by mouth as needed for allergies or rhinitis.    Marland Kitchen lansoprazole (PREVACID) 15 MG capsule Take 30 mg by mouth daily.    . Menthol, Topical Analgesic, (BENGAY VANISHING SCENT) 2.5 % GEL Apply topically as needed (for pain).     . metoprolol (LOPRESSOR) 50 MG tablet Take 25 mg by mouth 2 (two) times daily.     . mirtazapine (REMERON) 15 MG tablet Take 15 mg by mouth at bedtime.    . Multiple Vitamins-Minerals (CENTRUM SILVER PO) Take 1 tablet by mouth daily.    . nitroGLYCERIN (NITROSTAT) 0.4 MG SL tablet Place 0.4 mg under the tongue every 5 (five) minutes as needed for chest pain.     . Probiotic Product (PROBIOTIC DAILY PO) Take 1 tablet by mouth daily.     . traMADol (ULTRAM) 50 MG tablet Take 1-2 tablets (50-100 mg total) by mouth every 4 (four) hours as needed for moderate pain. 30 tablet 0  . vitamin B-12 (CYANOCOBALAMIN) 1000 MCG tablet Take 1,000 mcg by mouth daily.    Marland Kitchen warfarin (COUMADIN) 1 MG tablet 4 mg Mon - Fri and 5 mg on Sat and Sun     No current facility-administered medications for this visit.    Review of Systems Review of Systems  Constitutional: Negative.   Respiratory: Negative.   Cardiovascular: Negative.   Neurological: Positive for  headaches.  Blood pressure 138/66, pulse 76, resp. rate 14, height 4\' 8"  (1.422 m), weight 112 lb (50.803 kg).  Physical Exam Physical Exam  Pulmonary/Chest:    Musculoskeletal:       Back:      Assessment    Doing well status post excision of gluteal mass.    Plan         Nurse visit one week for silver nitrate application and MD in 3 weeks with mammogram f/u as well.  PCP:  [place] This information has been scribed by Karie Fetch RN, BSN,BC.   Robert Bellow 06/04/2016, 2:14 PM

## 2016-06-05 ENCOUNTER — Emergency Department: Payer: No Typology Code available for payment source

## 2016-06-05 ENCOUNTER — Emergency Department
Admission: EM | Admit: 2016-06-05 | Discharge: 2016-06-05 | Disposition: A | Discharge status: Home / Self Care | Payer: No Typology Code available for payment source | Attending: Emergency Medicine | Admitting: Emergency Medicine

## 2016-06-05 ENCOUNTER — Other Ambulatory Visit: Payer: Self-pay

## 2016-06-05 DIAGNOSIS — I10 Essential (primary) hypertension: Secondary | ICD-10-CM | POA: Diagnosis not present

## 2016-06-05 DIAGNOSIS — E785 Hyperlipidemia, unspecified: Secondary | ICD-10-CM | POA: Insufficient documentation

## 2016-06-05 DIAGNOSIS — Z96641 Presence of right artificial hip joint: Secondary | ICD-10-CM | POA: Diagnosis not present

## 2016-06-05 DIAGNOSIS — I2510 Atherosclerotic heart disease of native coronary artery without angina pectoris: Secondary | ICD-10-CM | POA: Diagnosis not present

## 2016-06-05 DIAGNOSIS — M1990 Unspecified osteoarthritis, unspecified site: Secondary | ICD-10-CM | POA: Insufficient documentation

## 2016-06-05 DIAGNOSIS — Y999 Unspecified external cause status: Secondary | ICD-10-CM | POA: Insufficient documentation

## 2016-06-05 DIAGNOSIS — Z853 Personal history of malignant neoplasm of breast: Secondary | ICD-10-CM | POA: Insufficient documentation

## 2016-06-05 DIAGNOSIS — Z7982 Long term (current) use of aspirin: Secondary | ICD-10-CM | POA: Diagnosis not present

## 2016-06-05 DIAGNOSIS — Z8673 Personal history of transient ischemic attack (TIA), and cerebral infarction without residual deficits: Secondary | ICD-10-CM | POA: Insufficient documentation

## 2016-06-05 DIAGNOSIS — I4891 Unspecified atrial fibrillation: Secondary | ICD-10-CM | POA: Diagnosis not present

## 2016-06-05 DIAGNOSIS — Y9389 Activity, other specified: Secondary | ICD-10-CM | POA: Diagnosis not present

## 2016-06-05 DIAGNOSIS — Z7901 Long term (current) use of anticoagulants: Secondary | ICD-10-CM | POA: Insufficient documentation

## 2016-06-05 DIAGNOSIS — S0083XA Contusion of other part of head, initial encounter: Secondary | ICD-10-CM | POA: Diagnosis not present

## 2016-06-05 DIAGNOSIS — Z79899 Other long term (current) drug therapy: Secondary | ICD-10-CM | POA: Insufficient documentation

## 2016-06-05 DIAGNOSIS — Y92410 Unspecified street and highway as the place of occurrence of the external cause: Secondary | ICD-10-CM | POA: Diagnosis not present

## 2016-06-05 DIAGNOSIS — S0081XA Abrasion of other part of head, initial encounter: Secondary | ICD-10-CM

## 2016-06-05 DIAGNOSIS — S0990XA Unspecified injury of head, initial encounter: Secondary | ICD-10-CM | POA: Diagnosis present

## 2016-06-05 LAB — PROTIME-INR
INR: 2.46
Prothrombin Time: 26.4 seconds — ABNORMAL HIGH (ref 11.4–15.0)

## 2016-06-05 MED ORDER — TETANUS-DIPHTH-ACELL PERTUSSIS 5-2.5-18.5 LF-MCG/0.5 IM SUSP
0.5000 mL | Freq: Once | INTRAMUSCULAR | Status: AC
  Administered 2016-06-05: 0.5 mL via INTRAMUSCULAR
  Filled 2016-06-05: qty 0.5

## 2016-06-05 MED ORDER — TETANUS-DIPHTH-ACELL PERTUSSIS 5-2.5-18.5 LF-MCG/0.5 IM SUSP: 0.5000 mL | Freq: Once | INTRAMUSCULAR | Status: DC
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
## 2016-06-09 ENCOUNTER — Telehealth: Payer: Self-pay | Admitting: Nurse Practitioner
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
## 2016-06-10 ENCOUNTER — Encounter: Payer: Self-pay | Admitting: General Surgery

## 2016-06-11 ENCOUNTER — Ambulatory Visit (INDEPENDENT_AMBULATORY_CARE_PROVIDER_SITE_OTHER): Payer: Medicare Other | Admitting: *Deleted

## 2016-06-11 DIAGNOSIS — R229 Localized swelling, mass and lump, unspecified: Secondary | ICD-10-CM

## 2016-06-11 NOTE — Progress Notes (Addendum)
Patient came in today for a wound check right gluteal abscess. The wound is clean, with no signs of infection noted. Siver Nitrate applied by Dr. Bary Castilla.

## 2016-06-23 ENCOUNTER — Ambulatory Visit (INDEPENDENT_AMBULATORY_CARE_PROVIDER_SITE_OTHER): Payer: Medicare Other | Admitting: Nurse Practitioner

## 2016-06-23 ENCOUNTER — Encounter: Payer: Self-pay | Admitting: Nurse Practitioner

## 2016-06-23 VITALS — BP 124/60 | HR 76 | Ht <= 58 in | Wt 112.6 lb

## 2016-06-23 DIAGNOSIS — I6350 Cerebral infarction due to unspecified occlusion or stenosis of unspecified cerebral artery: Secondary | ICD-10-CM

## 2016-06-23 DIAGNOSIS — R269 Unspecified abnormalities of gait and mobility: Secondary | ICD-10-CM | POA: Diagnosis not present

## 2016-06-23 DIAGNOSIS — R413 Other amnesia: Secondary | ICD-10-CM | POA: Diagnosis not present

## 2016-06-23 DIAGNOSIS — I6389 Other cerebral infarction: Secondary | ICD-10-CM
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
## 2016-06-23 NOTE — Patient Instructions (Signed)
Continue warfarin for secondary stroke prevention Strict control of hypertension with blood pressure goal below 130/90 today's reading 124/60 seated, standing 106/60 Increase water intake lipids with LDL cholesterol goal below 70 mg percent.  She was also advised fall prevention precautions F/U in 6 months

## 2016-06-25 ENCOUNTER — Ambulatory Visit (INDEPENDENT_AMBULATORY_CARE_PROVIDER_SITE_OTHER): Payer: Medicare Other | Admitting: General Surgery

## 2016-06-25 ENCOUNTER — Encounter: Payer: Self-pay | Admitting: General Surgery

## 2016-06-25 VITALS — BP 116/76 | HR 76 | Resp 14 | Ht <= 58 in | Wt 113.0 lb

## 2016-06-25 DIAGNOSIS — Z853 Personal history of malignant neoplasm of breast: Secondary | ICD-10-CM | POA: Diagnosis not present

## 2016-06-25 DIAGNOSIS — R229 Localized swelling, mass and lump, unspecified: Secondary | ICD-10-CM

## 2016-06-25 DIAGNOSIS — I6350 Cerebral infarction due to unspecified occlusion or stenosis of unspecified cerebral artery: Secondary | ICD-10-CM

## 2016-06-25 NOTE — Patient Instructions (Addendum)
May spray area with saline/salt water daily.

## 2016-06-25 NOTE — Progress Notes (Signed)
Patient ID: Peggy [name], female   DOB: January 09, 1928, 82 y.o.   MRN: [id number]  Chief Complaint  Patient presents with  . Follow-up    HPI Peggy [name] is a 82 y.o. female.  Here today for follow up left gluteal mass excision done on 05/09/16 and right mammogram was 06-10-16. She state she states that she has some discomfort with the gluteal area but not bad. She states she has drainage on her dressings when changed. She denies any new breast problems. She is here today with her daughter, [name]   HPI  Past Medical History  Diagnosis Date  . Essential hypertension, benign   . Arthritis   . Heart disease 2001  . Hypertension 1996  . Stroke [facility]) 2009  . Unspecified essential hypertension   . Hearing disorder 2012    hearing aids  . Reflux     acid reflux  . Breast screening, unspecified   . Lump or mass in breast   . Special screening for malignant neoplasms, colon   . Other benign neoplasm of connective and other soft tissue of thorax   . Personal history of malignant neoplasm of breast   . Atrial fibrillation ([place])   . Allergy   . Atrial fibrillation ([place])   . Atrial fibrillation with rapid ventricular response [facility]) November 2015    Heart rate up to 140 requiring IV Cardizem  . Hip pain   . Coronary artery disease   . Heart attack ([place]) 2001  . Anxiety   . GERD (gastroesophageal reflux disease)   . Diverticulosis   . Hyperlipidemia   . Dysrhythmia   . Shortness of breath dyspnea     with exertion  . Breast cancer [facility]) 2012    Patient underwent a left mastectomy on 03-31-11. Pathology showed DCIS at both sites without an invasive component. Sentinel nodes were negative. The breast lesions were noted to be ER +40%, PR-positive, 5%.   . Malignant neoplasm of upper-inner quadrant of female breast ([place])   . Malignant neoplasm of upper-outer quadrant of female breast ([place])   . Anginal pain ([place])     c/o angina on May 04, 2016 releived by Nitroglycerin X 2     Past Surgical History  Procedure Laterality Date  . Mastectomy  2012    left breast  . Breast biopsy      left breast biopsy over 15 years ago   . Upper gi endoscopy  2009    [facility] Dr. Donnella Sham  . Colonoscopy  2009    [facility] Dr. Donnella Sham  . Appendectomy      age 88  . Tonsillectomy      as a child  . Cholecystectomy      40 years ago   . Abdominal hysterectomy      age 69  . Small intestine surgery  12/31/2012    Abdominal exploration, lysis of adhesions for distal small bowel obstruction. Rochel Brome, MD  . Eye surgery Bilateral     Cataract Extraction with IOL  . Hernia repair    . Colon surgery      Dr. Tamala Julian, [facility]  . Total hip arthroplasty Right 02/27/2016    Procedure: TOTAL HIP ARTHROPLASTY;  Surgeon: Dereck Leep, MD;  Location: [facility] ORS;  Service: Orthopedics;  Laterality: Right;  . Joint replacement Right     TOTAL HIP REPLACEMENT  . Excision of back lesion Left 05/09/2016    Procedure: EXCISION OF GLUTEAL MASS;  Surgeon: Dellis Filbert  Amedeo Kinsman, MD;  Location: [facility] ORS;  Service: General;  Laterality: Left;    Family History  Problem Relation Age of Onset  . Colon cancer Neg Hx   . Ovarian cancer Neg Hx   . Breast cancer Sister     x 2, in their 56's  . Lung cancer Brother   . Stroke Father   . Heart disease Father   . Heart disease Mother     Social History Social History  Substance Use Topics  . Smoking status: Never Smoker   . Smokeless tobacco: Never Used  . Alcohol Use: No    Allergies  Allergen Reactions  . Alendronate Sodium     REACTION: questionable  . Amoxicillin-Pot Clavulanate   . Etodolac   . Ibandronate Sodium     REACTION: questionable  . Iodine     Other reaction(s): Unknown  . Lactose Intolerance (Gi) Other (See Comments)    GI problems  . Minocycline Hcl   . Risedronate Sodium   . Sulfonamide Derivatives Other (See Comments)    Stomach pain  . Zoledronic Acid     REACTION: "dental problems"    Current Outpatient  Prescriptions  Medication Sig Dispense Refill  . acetaminophen (TYLENOL) 325 MG tablet Take 325 mg by mouth every 6 (six) hours as needed for mild pain.     Marland Kitchen ALPRAZolam (XANAX) 0.25 MG tablet Take 0.25 mg by mouth at bedtime as needed for anxiety or sleep.     Marland Kitchen aspirin EC 81 MG tablet Take 81 mg by mouth daily.    . Calcium Carbonate-Vitamin D (RA CALCIUM PLUS VITAMIN D) 600-400 MG-UNIT tablet Take 1 tablet by mouth 2 (two) times daily.    Marland Kitchen diltiazem (DILACOR XR) 180 MG 24 hr capsule Take 180 mg by mouth daily.    Marland Kitchen docusate sodium (COLACE) 100 MG capsule Take 100 mg by mouth daily as needed for mild constipation.    Marland Kitchen escitalopram (LEXAPRO) 10 MG tablet Take 5 mg by mouth daily.     . fexofenadine (ALLEGRA) 180 MG tablet Take 180 mg by mouth as needed for allergies or rhinitis.    Marland Kitchen lansoprazole (PREVACID) 15 MG capsule Take 30 mg by mouth daily.    . Menthol, Topical Analgesic, (BENGAY VANISHING SCENT) 2.5 % GEL Apply topically as needed (for pain).     . metoprolol (LOPRESSOR) 50 MG tablet Take 25 mg by mouth 2 (two) times daily.     . mirtazapine (REMERON) 15 MG tablet Take 15 mg by mouth at bedtime.    . Multiple Vitamins-Minerals (CENTRUM SILVER PO) Take 1 tablet by mouth daily.    . nitroGLYCERIN (NITROSTAT) 0.4 MG SL tablet Place 0.4 mg under the tongue every 5 (five) minutes as needed for chest pain.     . Probiotic Product (PROBIOTIC DAILY PO) Take 1 tablet by mouth daily.     . traMADol (ULTRAM) 50 MG tablet Take 1-2 tablets (50-100 mg total) by mouth every 4 (four) hours as needed for moderate pain. 30 tablet 0  . vitamin B-12 (CYANOCOBALAMIN) 1000 MCG tablet Take 1,000 mcg by mouth daily.    Marland Kitchen warfarin (COUMADIN) 1 MG tablet 4 mg Mon - Fri and 5 mg on Sat and Sun     No current facility-administered medications for this visit.    Review of Systems Review of Systems  Constitutional: Negative.   Respiratory: Negative.   Cardiovascular: Negative.     Blood pressure 116/76,  pulse 76, resp.  rate 14, height 4\' 8"  (1.422 m), weight 113 lb (51.256 kg).  Physical Exam Physical Exam  Constitutional: She is oriented to person, place, and time. She appears well-developed and well-nourished.  Eyes: Conjunctivae are normal. No scleral icterus.  Neck: Neck supple.  Cardiovascular: Normal rate, regular rhythm and normal heart sounds.   Pulmonary/Chest: Effort normal and breath sounds normal. Right breast exhibits no inverted nipple, no mass, no nipple discharge, no skin change and no tenderness.    Musculoskeletal:       Back:  Lymphadenopathy:    She has no cervical adenopathy.  Neurological: She is alert and oriented to person, place, and time.  Skin: Skin is warm and dry.  Psychiatric: She has a normal mood and affect.    Data Reviewed Right breast mammogram dated 06/10/2016 completed at [facility] was reviewed. No interval change. BI-RADS 2.  Assessment    Benign breast exam.  Continued healing of gluteal wound status post mass excision.    Plan    Follow-up examination in 3 weeks.     PCP:  [place] This has been scribed by Lesly Rubenstein LPN  Robert Bellow 06/25/2016, 8:27 PM

## 2016-06-25 NOTE — Progress Notes (Signed)
I agree with the above plan 

## 2016-07-15 ENCOUNTER — Encounter: Payer: Self-pay | Admitting: General Surgery

## 2016-07-15 ENCOUNTER — Ambulatory Visit (INDEPENDENT_AMBULATORY_CARE_PROVIDER_SITE_OTHER): Payer: Medicare Other | Admitting: General Surgery

## 2016-07-15 VITALS — BP 122/74 | HR 72 | Resp 16 | Ht <= 58 in | Wt 111.0 lb

## 2016-07-15 DIAGNOSIS — I6350 Cerebral infarction due to unspecified occlusion or stenosis of unspecified cerebral artery: Secondary | ICD-10-CM

## 2016-07-15 DIAGNOSIS — R229 Localized swelling, mass and lump, unspecified: Secondary | ICD-10-CM

## 2016-07-15 NOTE — Patient Instructions (Signed)
Patient to return in one month. 

## 2016-07-15 NOTE — Progress Notes (Signed)
Patient ID: Peggy [name], female   DOB: 02-20-1928, 82 y.o.   MRN: [id number]  Chief Complaint  Patient presents with  . Other    buttock follow up     HPI Peggy [name] is a 82 y.o. female for follow up left gluteal mass excision done on 05/09/16 .Patient states the area is getting better because she is not having much drainage. She is here today with her daughter, Peggy [name] HPI  Past Medical History:  Diagnosis Date  . Allergy   . Anginal pain ([place])    c/o angina on May 04, 2016 releived by Nitroglycerin X 2  . Anxiety   . Arthritis   . Atrial fibrillation ([place])   . Atrial fibrillation ([place])   . Atrial fibrillation with rapid ventricular response [facility]) November 2015   Heart rate up to 140 requiring IV Cardizem  . Breast cancer [facility]) 2012   Patient underwent a left mastectomy on 03-31-11. Pathology showed DCIS at both sites without an invasive component. Sentinel nodes were negative. The breast lesions were noted to be ER +40%, PR-positive, 5%.   . Breast screening, unspecified   . Coronary artery disease   . Diverticulosis   . Dysrhythmia   . Essential hypertension, benign   . GERD (gastroesophageal reflux disease)   . Hearing disorder 2012   hearing aids  . Heart attack ([place]) 2001  . Heart disease 2001  . Hip pain   . Hyperlipidemia   . Hypertension 1996  . Lump or mass in breast   . Malignant neoplasm of upper-inner quadrant of female breast ([place])   . Malignant neoplasm of upper-outer quadrant of female breast ([place])   . Other benign neoplasm of connective and other soft tissue of thorax   . Personal history of malignant neoplasm of breast   . Reflux    acid reflux  . Shortness of breath dyspnea    with exertion  . Special screening for malignant neoplasms, colon   . Stroke [facility]) 2009  . Unspecified essential hypertension     Past Surgical History:  Procedure Laterality Date  . ABDOMINAL HYSTERECTOMY     age 54  . APPENDECTOMY     age 75  . BREAST  BIOPSY     left breast biopsy over 15 years ago   . CHOLECYSTECTOMY     40 years ago   . COLON SURGERY     Dr. Tamala Julian, [facility]  . COLONOSCOPY  2009   [facility] Dr. Donnella Sham  . EXCISION OF BACK LESION Left 05/09/2016   Procedure: EXCISION OF GLUTEAL MASS;  Surgeon: Robert Bellow, MD;  Location: [facility] ORS;  Service: General;  Laterality: Left;  . EYE SURGERY Bilateral    Cataract Extraction with IOL  . HERNIA REPAIR    . JOINT REPLACEMENT Right    TOTAL HIP REPLACEMENT  . MASTECTOMY  2012   left breast  . SMALL INTESTINE SURGERY  12/31/2012   Abdominal exploration, lysis of adhesions for distal small bowel obstruction. Rochel Brome, MD  . TONSILLECTOMY     as a child  . TOTAL HIP ARTHROPLASTY Right 02/27/2016   Procedure: TOTAL HIP ARTHROPLASTY;  Surgeon: Dereck Leep, MD;  Location: [facility] ORS;  Service: Orthopedics;  Laterality: Right;  . UPPER GI ENDOSCOPY  2009   [place] Dr. Donnella Sham    Family History  Problem Relation Age of Onset  . Colon cancer Neg Hx   . Ovarian cancer Neg Hx   . Breast  cancer Sister     x 2, in their 54's  . Lung cancer Brother   . Stroke Father   . Heart disease Father   . Heart disease Mother     Social History Social History  Substance Use Topics  . Smoking status: Never Smoker  . Smokeless tobacco: Never Used  . Alcohol use No    Allergies  Allergen Reactions  . Alendronate Sodium     REACTION: questionable  . Amoxicillin-Pot Clavulanate   . Etodolac   . Ibandronate Sodium     REACTION: questionable  . Iodine     Other reaction(s): Unknown  . Lactose Intolerance (Gi) Other (See Comments)    GI problems  . Minocycline Hcl   . Risedronate Sodium   . Sulfonamide Derivatives Other (See Comments)    Stomach pain  . Zoledronic Acid     REACTION: "dental problems"    Current Outpatient Prescriptions  Medication Sig Dispense Refill  . acetaminophen (TYLENOL) 325 MG tablet Take 325 mg by mouth every 6 (six) hours as needed for mild pain.      Marland Kitchen ALPRAZolam (XANAX) 0.25 MG tablet Take 0.25 mg by mouth at bedtime as needed for anxiety or sleep.     Marland Kitchen aspirin EC 81 MG tablet Take 81 mg by mouth daily.    . Calcium Carbonate-Vitamin D (RA CALCIUM PLUS VITAMIN D) 600-400 MG-UNIT tablet Take 1 tablet by mouth 2 (two) times daily.    Marland Kitchen diltiazem (DILACOR XR) 180 MG 24 hr capsule Take 180 mg by mouth daily.    Marland Kitchen docusate sodium (COLACE) 100 MG capsule Take 100 mg by mouth daily as needed for mild constipation.    Marland Kitchen escitalopram (LEXAPRO) 10 MG tablet Take 5 mg by mouth daily.     . fexofenadine (ALLEGRA) 180 MG tablet Take 180 mg by mouth as needed for allergies or rhinitis.    Marland Kitchen lansoprazole (PREVACID) 15 MG capsule Take 30 mg by mouth daily.    . Menthol, Topical Analgesic, (BENGAY VANISHING SCENT) 2.5 % GEL Apply topically as needed (for pain).     . metoprolol (LOPRESSOR) 50 MG tablet Take 25 mg by mouth 2 (two) times daily.     . mirtazapine (REMERON) 15 MG tablet Take 15 mg by mouth at bedtime.    . Multiple Vitamins-Minerals (CENTRUM SILVER PO) Take 1 tablet by mouth daily.    . nitroGLYCERIN (NITROSTAT) 0.4 MG SL tablet Place 0.4 mg under the tongue every 5 (five) minutes as needed for chest pain.     . Probiotic Product (PROBIOTIC DAILY PO) Take 1 tablet by mouth daily.     . traMADol (ULTRAM) 50 MG tablet Take 1-2 tablets (50-100 mg total) by mouth every 4 (four) hours as needed for moderate pain. 30 tablet 0  . vitamin B-12 (CYANOCOBALAMIN) 1000 MCG tablet Take 1,000 mcg by mouth daily.    Marland Kitchen warfarin (COUMADIN) 1 MG tablet 4 mg Mon - Fri and 5 mg on Sat and Sun     No current facility-administered medications for this visit.     Review of Systems Review of Systems  Constitutional: Negative.   Respiratory: Negative.   Cardiovascular: Negative.     Blood pressure 122/74, pulse 72, resp. rate 16, height 4\' 8"  (1.422 m), weight 111 lb (50.3 kg).  Physical Exam Physical Exam  Musculoskeletal:        Back:       Assessment    Study improvement in wound post  excision chronic calcification.    Plan    Will discontinue nursing visits.  The area may be covered with the specialized bandages previously obtained or a large Band-Aid.   The patient may shower.   Patient to return on one month. Patient no longer  need the nurse to come out any more.    PCP:  [place] This information has been scribed by Gaspar Cola CMA.    Robert Bellow 07/15/2016, 4:33 PM

## 2016-07-16 ENCOUNTER — Telehealth: Payer: Self-pay | Admitting: *Deleted

## 2016-07-16 NOTE — Telephone Encounter (Signed)
Notified Dawn with [facility] that she may discontinue home care visit, agrees.

## 2016-08-18 ENCOUNTER — Ambulatory Visit: Payer: Medicare Other | Admitting: General Surgery

## 2016-08-28 ENCOUNTER — Ambulatory Visit (INDEPENDENT_AMBULATORY_CARE_PROVIDER_SITE_OTHER): Payer: Medicare Other | Admitting: General Surgery

## 2016-08-28 ENCOUNTER — Encounter: Payer: Self-pay | Admitting: General Surgery

## 2016-08-28 VITALS — BP 126/70 | HR 74 | Resp 14 | Ht <= 58 in | Wt 113.0 lb

## 2016-08-28 DIAGNOSIS — I6350 Cerebral infarction due to unspecified occlusion or stenosis of unspecified cerebral artery: Secondary | ICD-10-CM

## 2016-08-28 DIAGNOSIS — R229 Localized swelling, mass and lump, unspecified: Secondary | ICD-10-CM

## 2016-08-28 NOTE — Progress Notes (Signed)
Patient ID: Peggy [name], female   DOB: [birth year], 82 y.o.   MRN: [id number]  Chief Complaint  Patient presents with  . Follow-up    HPI Peggy [name] is a 82 y.o. female for follow up left gluteal mass excision done on 05/09/16 .Patient states the area is getting better because she is not having any 126 70 drainage. She is here today with her daughter, Peggy [name] HPI  Past Medical History:  Diagnosis Date  . Allergy   . Anginal pain ([place])    c/o angina on May 04, 2016 releived by Nitroglycerin X 2  . Anxiety   . Arthritis   . Atrial fibrillation ([place])   . Atrial fibrillation ([place])   . Atrial fibrillation with rapid ventricular response [facility]) November 2015   Heart rate up to 140 requiring IV Cardizem  . Breast cancer [facility]) 2012   Patient underwent a left mastectomy on 03-31-11. Pathology showed DCIS at both sites without an invasive component. Sentinel nodes were negative. The breast lesions were noted to be ER +40%, PR-positive, 5%.   . Breast screening, unspecified   . Coronary artery disease   . Diverticulosis   . Dysrhythmia   . Essential hypertension, benign   . GERD (gastroesophageal reflux disease)   . Hearing disorder 2012   hearing aids  . Heart attack ([place]) 2001  . Heart disease 2001  . Hip pain   . Hyperlipidemia   . Hypertension 1996  . Lump or mass in breast   . Malignant neoplasm of upper-inner quadrant of female breast ([place])   . Malignant neoplasm of upper-outer quadrant of female breast ([place])   . Other benign neoplasm of connective and other soft tissue of thorax   . Personal history of malignant neoplasm of breast   . Reflux    acid reflux  . Shortness of breath dyspnea    with exertion  . Special screening for malignant neoplasms, colon   . Stroke [facility]) 2009  . Unspecified essential hypertension     Past Surgical History:  Procedure Laterality Date  . ABDOMINAL HYSTERECTOMY     age 66  . APPENDECTOMY     age 56  . BREAST BIOPSY     left  breast biopsy over 15 years ago   . CHOLECYSTECTOMY     40 years ago   . COLON SURGERY     Dr. Tamala Julian, [facility]  . COLONOSCOPY  2009   [facility] Dr. Donnella Sham  . EXCISION OF BACK LESION Left 05/09/2016   Procedure: EXCISION OF GLUTEAL MASS;  Surgeon: Robert Bellow, MD;  Location: [facility] ORS;  Service: General;  Laterality: Left;  . EYE SURGERY Bilateral    Cataract Extraction with IOL  . HERNIA REPAIR    . JOINT REPLACEMENT Right    TOTAL HIP REPLACEMENT  . MASTECTOMY  2012   left breast  . SMALL INTESTINE SURGERY  12/31/2012   Abdominal exploration, lysis of adhesions for distal small bowel obstruction. Rochel Brome, MD  . TONSILLECTOMY     as a child  . TOTAL HIP ARTHROPLASTY Right 02/27/2016   Procedure: TOTAL HIP ARTHROPLASTY;  Surgeon: Dereck Leep, MD;  Location: [facility] ORS;  Service: Orthopedics;  Laterality: Right;  . UPPER GI ENDOSCOPY  2009   [place] Dr. Donnella Sham    Family History  Problem Relation Age of Onset  . Colon cancer Neg Hx   . Ovarian cancer Neg Hx   . Breast cancer Sister  x 2, in their 58's  . Lung cancer Brother   . Stroke Father   . Heart disease Father   . Heart disease Mother     Social History Social History  Substance Use Topics  . Smoking status: Never Smoker  . Smokeless tobacco: Never Used  . Alcohol use No    Allergies  Allergen Reactions  . Alendronate Sodium     REACTION: questionable  . Amoxicillin-Pot Clavulanate   . Etodolac   . Ibandronate Sodium     REACTION: questionable  . Iodine     Other reaction(s): Unknown  . Lactose Intolerance (Gi) Other (See Comments)    GI problems  . Minocycline Hcl   . Risedronate Sodium   . Sulfonamide Derivatives Other (See Comments)    Stomach pain  . Zoledronic Acid     REACTION: "dental problems"    Current Outpatient Prescriptions  Medication Sig Dispense Refill  . acetaminophen (TYLENOL) 325 MG tablet Take 325 mg by mouth every 6 (six) hours as needed for mild pain.     Marland Kitchen ALPRAZolam  (XANAX) 0.25 MG tablet Take 0.25 mg by mouth at bedtime as needed for anxiety or sleep.     Marland Kitchen aspirin EC 81 MG tablet Take 81 mg by mouth daily.    . Calcium Carbonate-Vitamin D (RA CALCIUM PLUS VITAMIN D) 600-400 MG-UNIT tablet Take 1 tablet by mouth 2 (two) times daily.    Marland Kitchen diltiazem (DILACOR XR) 180 MG 24 hr capsule Take 180 mg by mouth daily.    Marland Kitchen docusate sodium (COLACE) 100 MG capsule Take 100 mg by mouth daily as needed for mild constipation.    Marland Kitchen escitalopram (LEXAPRO) 10 MG tablet Take 5 mg by mouth daily.     . fexofenadine (ALLEGRA) 180 MG tablet Take 180 mg by mouth as needed for allergies or rhinitis.    Marland Kitchen lansoprazole (PREVACID) 15 MG capsule Take 30 mg by mouth daily.    . Menthol, Topical Analgesic, (BENGAY VANISHING SCENT) 2.5 % GEL Apply topically as needed (for pain).     . metoprolol (LOPRESSOR) 50 MG tablet Take 25 mg by mouth 2 (two) times daily.     . mirtazapine (REMERON) 15 MG tablet Take 15 mg by mouth at bedtime.    . Multiple Vitamins-Minerals (CENTRUM SILVER PO) Take 1 tablet by mouth daily.    . nitroGLYCERIN (NITROSTAT) 0.4 MG SL tablet Place 0.4 mg under the tongue every 5 (five) minutes as needed for chest pain.     . Probiotic Product (PROBIOTIC DAILY PO) Take 1 tablet by mouth daily.     . traMADol (ULTRAM) 50 MG tablet Take 1-2 tablets (50-100 mg total) by mouth every 4 (four) hours as needed for moderate pain. 30 tablet 0  . vitamin B-12 (CYANOCOBALAMIN) 1000 MCG tablet Take 1,000 mcg by mouth daily.    Marland Kitchen warfarin (COUMADIN) 1 MG tablet 3 mg daily. 4 mg Mon - Fri and 5 mg on Sat and Sun     No current facility-administered medications for this visit.     Review of Systems Review of Systems  Blood pressure 126/70, pulse 74, resp. rate 14, height 4\' 8"  (1.422 m), weight 113 lb (51.3 kg).  Physical Exam Physical Exam  Constitutional: She is oriented to person, place, and time. She appears well-developed and well-nourished.  Genitourinary:   Genitourinary Comments: Left gluteal mass is well healed.  Musculoskeletal:       Back:  Neurological: She is alert and  oriented to person, place, and time.  Skin: Skin is dry.      Assessment    Complete resolution of left gluteal mass.    Plan       Patient to return as needed regarding the surgery site.  We'll arrange for a follow-up screening right breast mammogram and clinical exam in June 2018. This information has been scribed by Gaspar Cola CMA.    Robert Bellow 08/30/2016, 10:05 AM

## 2016-08-28 NOTE — Patient Instructions (Signed)
Patient to return as needed. 

## 2016-11-04 ENCOUNTER — Other Ambulatory Visit: Payer: Self-pay | Admitting: Physician Assistant

## 2016-11-04 DIAGNOSIS — M5416 Radiculopathy, lumbar region: Secondary | ICD-10-CM

## 2016-11-15 ENCOUNTER — Other Ambulatory Visit: Payer: Self-pay | Admitting: Physician Assistant

## 2016-11-15 ENCOUNTER — Ambulatory Visit
Admission: RE | Admit: 2016-11-15 | Discharge: 2016-11-15 | Disposition: A | Discharge status: Home / Self Care | Payer: Medicare Other | Source: Ambulatory Visit | Attending: Physician Assistant | Admitting: Physician Assistant

## 2016-11-15 DIAGNOSIS — M5416 Radiculopathy, lumbar region: Secondary | ICD-10-CM

## 2016-11-24 ENCOUNTER — Other Ambulatory Visit: Payer: Self-pay | Admitting: Adult Health

## 2016-11-24 DIAGNOSIS — R1011 Right upper quadrant pain: Secondary | ICD-10-CM

## 2016-11-27 ENCOUNTER — Emergency Department: Payer: Medicare Other

## 2016-11-27 ENCOUNTER — Emergency Department
Admission: EM | Admit: 2016-11-27 | Discharge: 2016-11-27 | Disposition: A | Discharge status: Home / Self Care | Payer: Medicare Other | Attending: Emergency Medicine | Admitting: Emergency Medicine

## 2016-11-27 DIAGNOSIS — R1011 Right upper quadrant pain: Secondary | ICD-10-CM | POA: Diagnosis present

## 2016-11-27 DIAGNOSIS — Z7982 Long term (current) use of aspirin: Secondary | ICD-10-CM | POA: Diagnosis not present

## 2016-11-27 DIAGNOSIS — R0789 Other chest pain: Secondary | ICD-10-CM | POA: Insufficient documentation

## 2016-11-27 DIAGNOSIS — Z7901 Long term (current) use of anticoagulants: Secondary | ICD-10-CM | POA: Insufficient documentation

## 2016-11-27 DIAGNOSIS — I2510 Atherosclerotic heart disease of native coronary artery without angina pectoris: Secondary | ICD-10-CM | POA: Diagnosis not present

## 2016-11-27 DIAGNOSIS — Z79899 Other long term (current) drug therapy: Secondary | ICD-10-CM | POA: Insufficient documentation

## 2016-11-27 DIAGNOSIS — R1013 Epigastric pain: Secondary | ICD-10-CM | POA: Insufficient documentation

## 2016-11-27 DIAGNOSIS — I10 Essential (primary) hypertension: Secondary | ICD-10-CM | POA: Diagnosis not present

## 2016-11-27 DIAGNOSIS — Z853 Personal history of malignant neoplasm of breast: Secondary | ICD-10-CM | POA: Diagnosis not present

## 2016-11-27 DIAGNOSIS — R109 Unspecified abdominal pain: Secondary | ICD-10-CM

## 2016-11-27 LAB — CBC WITH DIFFERENTIAL/PLATELET
BASOS ABS: 0 10*3/uL (ref 0–0.1)
BASOS PCT: 0 %
EOS PCT: 1 %
Eosinophils Absolute: 0.1 10*3/uL (ref 0–0.7)
HCT: 35.1 % (ref 35.0–47.0)
Hemoglobin: 12 g/dL (ref 12.0–16.0)
LYMPHS PCT: 22 %
Lymphs Abs: 1.7 10*3/uL (ref 1.0–3.6)
MCH: 32.3 pg (ref 26.0–34.0)
MCHC: 34.2 g/dL (ref 32.0–36.0)
MCV: 94.5 fL (ref 80.0–100.0)
MONO ABS: 0.9 10*3/uL (ref 0.2–0.9)
Monocytes Relative: 11 %
Neutro Abs: 5.2 10*3/uL (ref 1.4–6.5)
Neutrophils Relative %: 66 %
PLATELETS: 277 10*3/uL (ref 150–440)
RBC: 3.71 MIL/uL — ABNORMAL LOW (ref 3.80–5.20)
RDW: 16.8 % — AB (ref 11.5–14.5)
WBC: 7.9 10*3/uL (ref 3.6–11.0)

## 2016-11-27 LAB — BASIC METABOLIC PANEL
Anion gap: 8 (ref 5–15)
BUN: 23 mg/dL — ABNORMAL HIGH (ref 6–20)
CALCIUM: 8.7 mg/dL — AB (ref 8.9–10.3)
CO2: 22 mmol/L (ref 22–32)
CREATININE: 0.94 mg/dL (ref 0.44–1.00)
Chloride: 110 mmol/L (ref 101–111)
GFR, EST NON AFRICAN AMERICAN: 53 mL/min — AB (ref >60)
Glucose, Bld: 109 mg/dL — ABNORMAL HIGH (ref 65–99)
Potassium: 4.3 mmol/L (ref 3.5–5.1)
SODIUM: 140 mmol/L (ref 135–145)

## 2016-11-27 LAB — TROPONIN I
TROPONIN I: 0.03 ng/mL — AB (ref <0.03)
Troponin I: 0.03 ng/mL (ref <0.03)

## 2016-11-27 LAB — HEPATIC FUNCTION PANEL
ALK PHOS: 95 U/L (ref 38–126)
ALT: 22 U/L (ref 14–54)
AST: 19 U/L (ref 15–41)
Albumin: 3.3 g/dL — ABNORMAL LOW (ref 3.5–5.0)
BILIRUBIN TOTAL: 0.8 mg/dL (ref 0.3–1.2)
Total Protein: 6.6 g/dL (ref 6.5–8.1)

## 2016-11-27 LAB — PROTIME-INR
INR: 1.46
Prothrombin Time: 17.9 seconds — ABNORMAL HIGH (ref 11.4–15.2)

## 2016-11-27 LAB — APTT: aPTT: 36 seconds (ref 24–36)

## 2016-11-27 LAB — LIPASE, BLOOD: LIPASE: 27 U/L (ref 11–51)

## 2016-11-27 MED ORDER — FENTANYL CITRATE (PF) 100 MCG/2ML IJ SOLN
50.0000 ug | Freq: Once | INTRAMUSCULAR | Status: AC
  Administered 2016-11-27: 50 ug via INTRAVENOUS
  Filled 2016-11-27: qty 2

## 2016-11-27 NOTE — ED Notes (Signed)
MD McShane at bedside  

## 2016-11-27 NOTE — ED Triage Notes (Signed)
Per EMS: Pt called out for chest pain radiating to back. Pt took 3 nitroglycerin prior to EMS arrival. Chest pain resolved after EMS arrived. PT c/o upper right abdominal pain radiating to right back - patient already has appointment with PCP for this issue

## 2016-11-27 NOTE — Discharge Instructions (Signed)
At this time, you would prefer to go home. This is certainly not unreasonable, but as we discussed it does limit our ability to observe and possibly rule out other problems. For this reason, we stressed that if you have increased pain, shortness of breath, nausea, vomiting or you feel worse in any way that you  return to the emergency department. Follow closely with primary care doctor.

## 2016-11-27 NOTE — ED Provider Notes (Addendum)
[facility] Emergency Department Provider Note  ____________________________________________   I have reviewed the triage vital signs and the nursing notes.   HISTORY  Chief Complaint Chest Pain    HPI Peggy [name] is a 82 y.o. female  who presents today complaining of 2 things. Patient has had chronic recurrent right upper quadrant epigastric discomfort she has been there for several weeks. She states that was "flaring up" and then she started having substernal lower chest pain. She took some nitroglycerin and the chest pain soon ago but she still has right upper quadrant and epigastric abdominal discomfort. She states the pain is coming and going for she is scheduled for an outpatient ultrasound. She denies any shortness of breath. She denies any nausea or vomiting. She states the pain does seem to get better when she burps. He denies any fever chills nausea vomiting abdominal pain otherwise in her abdomen, she denies lower abdominal discomfort, she denies rectal bleeding hematemesis or any other new or worrisome symptoms with a variety. The chest pain, as it was, started at rest when she was watching TV, it was underneath her lower sternum. Did not seem to radiate. Was not pleuritic not associated with any shortness of breath. No recent swelling in her legs. On Coumadin for atrial fibrillation. They recently increased her Coumadin dose because she was slightly subtherapeutic.    Past Medical History:  Diagnosis Date  . Allergy   . Anginal pain ([place])    c/o angina on May 04, 2016 releived by Nitroglycerin X 2  . Anxiety   . Arthritis   . Atrial fibrillation ([place])   . Atrial fibrillation ([place])   . Atrial fibrillation with rapid ventricular response [facility]) November 2015   Heart rate up to 140 requiring IV Cardizem  . Breast cancer [facility]) 2012   Patient underwent a left mastectomy on 03-31-11. Pathology showed DCIS at both sites without an invasive component.  Sentinel nodes were negative. The breast lesions were noted to be ER +40%, PR-positive, 5%.   . Breast screening, unspecified   . Coronary artery disease   . Diverticulosis   . Dysrhythmia   . Essential hypertension, benign   . GERD (gastroesophageal reflux disease)   . Hearing disorder 2012   hearing aids  . Heart attack 2001  . Heart disease 2001  . Hip pain   . Hyperlipidemia   . Hypertension 1996  . Lump or mass in breast   . Malignant neoplasm of upper-inner quadrant of female breast ([place])   . Malignant neoplasm of upper-outer quadrant of female breast ([place])   . Other benign neoplasm of connective and other soft tissue of thorax   . Personal history of malignant neoplasm of breast   . Reflux    acid reflux  . Shortness of breath dyspnea    with exertion  . Special screening for malignant neoplasms, colon   . Stroke [facility]) 2009  . Unspecified essential hypertension     Patient Active Problem List   Diagnosis Date Noted  . S/P total hip arthroplasty 02/27/2016  . Hip pain 01/15/2016  . Memory loss 03/21/2015  . Abnormality of gait 03/21/2015  . Atrial fibrillation with RVR ([place]) 03/11/2015  . H/O: CVA (cerebrovascular accident) 03/11/2015  . 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 Blurred vision 09/05/2013  . 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 EPIGASTRIC PAIN 02/14/2009  . NONSPECIFIC ABN FINDNG RAD&OTH  EXAM BILARY TRCT 02/14/2009    Past Surgical History:  Procedure Laterality Date  . ABDOMINAL HYSTERECTOMY     age 67  . APPENDECTOMY     age 22  . BREAST BIOPSY     left breast biopsy over 15 years ago   . CHOLECYSTECTOMY     40 years ago   . COLON SURGERY     Dr. Tamala Julian, [facility]  . COLONOSCOPY  2009   [facility] Dr. Donnella Sham  . EXCISION OF BACK LESION Left 05/09/2016   Procedure: EXCISION OF GLUTEAL MASS;  Surgeon: Robert Bellow, MD;  Location: [facility] ORS;  Service: General;   Laterality: Left;  . EYE SURGERY Bilateral    Cataract Extraction with IOL  . HERNIA REPAIR    . JOINT REPLACEMENT Right    TOTAL HIP REPLACEMENT  . MASTECTOMY  2012   left breast  . SMALL INTESTINE SURGERY  12/31/2012   Abdominal exploration, lysis of adhesions for distal small bowel obstruction. Rochel Brome, MD  . TONSILLECTOMY     as a child  . TOTAL HIP ARTHROPLASTY Right 02/27/2016   Procedure: TOTAL HIP ARTHROPLASTY;  Surgeon: Dereck Leep, MD;  Location: [facility] ORS;  Service: Orthopedics;  Laterality: Right;  . UPPER GI ENDOSCOPY  2009   [facility] Dr. Donnella Sham    Prior to Admission medications   Medication Sig Start Date End Date Taking? Authorizing Provider  ALPRAZolam Duanne Moron) 0.25 MG tablet Take 0.25 mg by mouth at bedtime as needed for anxiety or sleep.    Yes Historical Provider, MD  aspirin EC 81 MG tablet Take 81 mg by mouth daily.   Yes Historical Provider, MD  Calcium Carbonate-Vitamin D (RA CALCIUM PLUS VITAMIN D) 600-400 MG-UNIT tablet Take 1 tablet by mouth 2 (two) times daily.   Yes Historical Provider, MD  diltiazem (DILACOR XR) 180 MG 24 hr capsule Take 180 mg by mouth daily.   Yes Historical Provider, MD  docusate sodium (COLACE) 100 MG capsule Take 100 mg by mouth daily as needed for mild constipation.   Yes Historical Provider, MD  escitalopram (LEXAPRO) 10 MG tablet Take 10 mg by mouth at bedtime.    Yes Historical Provider, MD  fexofenadine (ALLEGRA) 180 MG tablet Take 180 mg by mouth as needed for allergies or rhinitis.   Yes Historical Provider, MD  lansoprazole (PREVACID) 15 MG capsule Take 30 mg by mouth daily.   Yes Historical Provider, MD  Menthol, Topical Analgesic, (BENGAY VANISHING SCENT) 2.5 % GEL Apply topically as needed (for pain).    Yes Historical Provider, MD  metoprolol tartrate (LOPRESSOR) 25 MG tablet Take 25 mg by mouth 2 (two) times daily.    Yes Historical Provider, MD  mirtazapine (REMERON) 15 MG tablet Take 15 mg by mouth at bedtime.   Yes  Historical Provider, MD  Multiple Vitamins-Minerals (CENTRUM SILVER PO) Take 1 tablet by mouth daily.   Yes Historical Provider, MD  Multiple Vitamins-Minerals (PRESERVISION AREDS 2 PO) Take 1 tablet by mouth 2 (two) times daily.   Yes Historical Provider, MD  nitroGLYCERIN (NITROSTAT) 0.4 MG SL tablet Place 0.4 mg under the tongue every 5 (five) minutes as needed for chest pain.    Yes Historical Provider, MD  Probiotic Product (PROBIOTIC DAILY PO) Take 1 tablet by mouth daily.    Yes Historical Provider, MD  traMADol (ULTRAM) 50 MG tablet Take 1-2 tablets (50-100 mg total) by mouth every 4 (four) hours as needed for moderate pain. 02/28/16  Yes Wille Glaser  Lindon Romp, PA  vitamin B-12 (CYANOCOBALAMIN) 1000 MCG tablet Take 1,000 mcg by mouth at bedtime.    Yes Historical Provider, MD  warfarin (COUMADIN) 1 MG tablet 3 mg daily.  06/02/16  Yes Historical Provider, MD  acetaminophen (TYLENOL) 500 MG tablet Take 1,000 mg by mouth every 6 (six) hours as needed for mild pain.     Historical Provider, MD    Allergies Alendronate sodium; Amoxicillin-pot clavulanate; Etodolac; Ibandronate sodium; Iodine; Lactose intolerance (gi); Minocycline hcl; Risedronate sodium; Sulfonamide derivatives; and Zoledronic acid  Family History  Problem Relation Age of Onset  . Heart disease Mother   . Stroke Father   . Heart disease Father   . Breast cancer Sister     x 2, in their 22's  . Lung cancer Brother   . Colon cancer Neg Hx   . Ovarian cancer Neg Hx     Social History Social History  Substance Use Topics  . Smoking status: Never Smoker  . Smokeless tobacco: Never Used  . Alcohol use No    Review of Systems Constitutional: No fever/chills Eyes: No visual changes. ENT: No sore throat. No stiff neck no neck pain Cardiovascular: Denies chest pain. Respiratory: Denies shortness of breath. Gastrointestinal:   no vomiting.  No diarrhea.  No constipation. Genitourinary: Negative for dysuria. Musculoskeletal:  Negative lower extremity swelling Skin: Negative for rash. Neurological: Negative for severe headaches, focal weakness or numbness. 10-point ROS otherwise negative.  ____________________________________________   PHYSICAL EXAM:  VITAL SIGNS: ED Triage Vitals  Enc Vitals Group     BP 11/27/16 1723 (!) 143/77     Pulse Rate 11/27/16 1723 80     Resp 11/27/16 1723 (!) 22     Temp 11/27/16 1723 98 F (36.7 C)     Temp Source 11/27/16 1723 Oral     SpO2 11/27/16 1723 97 %     Weight 11/27/16 1719 116 lb (52.6 kg)     Height 11/27/16 1719 4\' 8"  (1.422 m)     Head Circumference --      Peak Flow --      Pain Score 11/27/16 1719 10     Pain Loc --      Pain Edu? --      Excl. in [place]? --     Constitutional: Alert and oriented. Well appearing and in no acute distress. Eyes: Conjunctivae are normal. PERRL. EOMI. Head: Atraumatic. Nose: No congestion/rhinnorhea. Mouth/Throat: Mucous membranes are moist.  Oropharynx non-erythematous. Neck: No stridor.   Nontender with no meningismus Cardiovascular: Normal rate, regular rhythm. Grossly normal heart sounds.  Good peripheral circulation. Respiratory: Normal respiratory effort.  No retractions. Lungs CTAB. Abdominal: Soft and Positive epigastric discomfort which reproduces her pain, nonsurgical abdomen however no right upper quadrant tenderness noted as well. No distention. No guarding no rebound Back:  There is no focal tenderness or step off.  there is no midline tenderness there are no lesions noted. there is no CVA tenderness Musculoskeletal: No lower extremity tenderness, no upper extremity tenderness. No joint effusions, no DVT signs strong distal pulses no edema Neurologic:  Normal speech and language. No gross focal neurologic deficits are appreciated.  Skin:  Skin is warm, dry and intact. No rash noted. Psychiatric: Mood and affect are normal. Speech and behavior are normal.  ____________________________________________    LABS (all labs ordered are listed, but only abnormal results are displayed)  Labs Reviewed  BASIC METABOLIC PANEL - Abnormal; Notable for the following:  Result Value   Glucose, Bld 109 (*)    BUN 23 (*)    Calcium 8.7 (*)    GFR calc non Af Amer 53 (*)    All other components within normal limits  TROPONIN I - Abnormal; Notable for the following:    Troponin I 0.03 (*)    All other components within normal limits  PROTIME-INR - Abnormal; Notable for the following:    Prothrombin Time 17.9 (*)    All other components within normal limits  HEPATIC FUNCTION PANEL - Abnormal; Notable for the following:    Albumin 3.3 (*)    Bilirubin, Direct <0.1 (*)    All other components within normal limits  CBC WITH DIFFERENTIAL/PLATELET - Abnormal; Notable for the following:    RBC 3.71 (*)    RDW 16.8 (*)    All other components within normal limits  LIPASE, BLOOD  APTT  TROPONIN I   ____________________________________________  EKG  I personally interpreted any EKGs ordered by me or triage  Patient is in sinus rhythm with PACs noted. No acute ST elevation or depression, left axis deviation noted. LAFB noted. No significant change from prior ____________________________________________  RADIOLOGY  I reviewed any imaging ordered by me or triage that were performed during my shift and, if possible, patient and/or family made aware of any abnormal findings. ____________________________________________   PROCEDURES  Procedure(s) performed: None  Procedures  Critical Care performed: None  ____________________________________________   INITIAL IMPRESSION / ASSESSMENT AND PLAN / ED COURSE  Pertinent labs & imaging results that were available during my care of the patient were reviewed by me and considered in my medical decision making (see chart for details).  Patient with chest discomfort truly seems to come from her abdomen. She has no history of CAD. She states that  the pain got better when she is not sure which she also burped at that time. She does have reproducible epigastric abdominal pain initially. For this reason, I did check liver fraction tests, lipase, and they are reassuring. There is no evidence at this time of significant abdominal pathology did do a ultrasound which showed no change in her crawl and bile duct dilatation which is been there since 2014 and no other acute lesions. I did do cardiac enzymes which are not significantly positive. I did do chest x-ray as well. After being in the room for some time, patient states she "burped a lot" and her pain completely went away. At this time, she has a completely benign abdomen with no tenderness noted. I have discussed admitting the hospital this patient who has multiple different risk factors and did have chest pain, and at this time they prefer to go home if possible. We will send a second cardiac enzyme and if negative we will discuss with him one more time if they still feel that they would like to go home we will discharge him. They understand that it may in some ways be safer to be admitted to the hospital, and that there is some risk of going home however at the same time, this is been a pretty reassuring workup and at this time there is no ongoing symptom of any variety. Patient is somewhat subtherapeutic on her Coumadin however she at this time is in sinus rhythm and she recently had her warfarin increased to 3 mg from 2 mg to take effect in short order, therefore I will not alter her anticoagulation medication.  ----------------------------------------- 9:17 PM on 11/27/2016 -----------------------------------------  She remains absolutely asymptomatic. She states usually when she gets these pains, especially after taking her pills, she drinks water and walks around and gradually goes away. I have advised that she follow-up with a GI doctor. This does seem to be more gastrointestinal than cardiac.   At this time, there does not appear to be clinical evidence to support the diagnosis of pulmonary embolus, dissection, myocarditis, endocarditis, pericarditis, pericardial tamponade, acute coronary syndrome, pneumothorax, pneumonia, or any other acute intrathoracic pathology that will require admission or acute intervention. Nor is there evidence of any significant intra-abdominal pathology causing this discomfort. I have again offered admission to the hospital but Asian and family after a large "family conference" would prefer to go home. This is not unreasonable. Return precautions and follow-up given and understood. We will also refer her to GI. Troponin is borderline but not trending up, and this is common for her lab to have 0.03 and a negative cardiac workup.  Clinical Course    ____________________________________________   FINAL CLINICAL IMPRESSION(S) / ED DIAGNOSES  Final diagnoses:  Abdominal pain      This chart was dictated using voice recognition software.  Despite best efforts to proofread,  errors can occur which can change meaning.      Schuyler Amor, MD 11/27/16 2050    Schuyler Amor, MD 11/27/16 2050    Schuyler Amor, MD 11/27/16 2118

## 2016-11-27 NOTE — ED Notes (Signed)
Reviewed d/c instructions, follow-up care with pt. Pt verbalized understanding 

## 2016-11-27 NOTE — ED Notes (Signed)
RN unable to obtain blood from IV. NT Jann to bedside to draw blood/labs

## 2016-12-01 ENCOUNTER — Ambulatory Visit: Payer: Medicare Other

## 2016-12-05 ENCOUNTER — Emergency Department
Admission: EM | Admit: 2016-12-05 | Discharge: 2016-12-05 | Disposition: A | Discharge status: Home / Self Care | Payer: Medicare Other | Source: Home / Self Care | Attending: Emergency Medicine | Admitting: Emergency Medicine

## 2016-12-05 ENCOUNTER — Encounter: Payer: Self-pay | Admitting: Emergency Medicine

## 2016-12-05 ENCOUNTER — Emergency Department: Payer: Medicare Other

## 2016-12-05 DIAGNOSIS — I4892 Unspecified atrial flutter: Secondary | ICD-10-CM | POA: Diagnosis not present

## 2016-12-05 DIAGNOSIS — I2510 Atherosclerotic heart disease of native coronary artery without angina pectoris: Secondary | ICD-10-CM | POA: Insufficient documentation

## 2016-12-05 DIAGNOSIS — I10 Essential (primary) hypertension: Secondary | ICD-10-CM | POA: Insufficient documentation

## 2016-12-05 DIAGNOSIS — Z79899 Other long term (current) drug therapy: Secondary | ICD-10-CM | POA: Insufficient documentation

## 2016-12-05 DIAGNOSIS — R1010 Upper abdominal pain, unspecified: Secondary | ICD-10-CM

## 2016-12-05 DIAGNOSIS — Z853 Personal history of malignant neoplasm of breast: Secondary | ICD-10-CM | POA: Insufficient documentation

## 2016-12-05 DIAGNOSIS — R1032 Left lower quadrant pain: Secondary | ICD-10-CM | POA: Insufficient documentation

## 2016-12-05 DIAGNOSIS — R1030 Lower abdominal pain, unspecified: Secondary | ICD-10-CM

## 2016-12-05 LAB — CBC
HCT: 39.8 % (ref 35.0–47.0)
Hemoglobin: 13.1 g/dL (ref 12.0–16.0)
MCH: 30.9 pg (ref 26.0–34.0)
MCHC: 32.8 g/dL (ref 32.0–36.0)
MCV: 94.2 fL (ref 80.0–100.0)
Platelets: 384 K/uL (ref 150–440)
RBC: 4.23 MIL/uL (ref 3.80–5.20)
RDW: 16.5 % — ABNORMAL HIGH (ref 11.5–14.5)
WBC: 11.8 K/uL — ABNORMAL HIGH (ref 3.6–11.0)

## 2016-12-05 LAB — COMPREHENSIVE METABOLIC PANEL WITH GFR
ALT: 23 U/L (ref 14–54)
AST: 25 U/L (ref 15–41)
Albumin: 3.7 g/dL (ref 3.5–5.0)
Alkaline Phosphatase: 121 U/L (ref 38–126)
Anion gap: 4 — ABNORMAL LOW (ref 5–15)
BUN: 17 mg/dL (ref 6–20)
CO2: 27 mmol/L (ref 22–32)
Calcium: 9.1 mg/dL (ref 8.9–10.3)
Chloride: 105 mmol/L (ref 101–111)
Creatinine, Ser: 0.86 mg/dL (ref 0.44–1.00)
GFR calc Af Amer: >60 mL/min (ref >60)
GFR calc non Af Amer: 59 mL/min — ABNORMAL LOW (ref >60)
Glucose, Bld: 90 mg/dL (ref 65–99)
Potassium: 5 mmol/L (ref 3.5–5.1)
Sodium: 136 mmol/L (ref 135–145)
Total Bilirubin: 0.6 mg/dL (ref 0.3–1.2)
Total Protein: 7.6 g/dL (ref 6.5–8.1)

## 2016-12-05 LAB — URINALYSIS, COMPLETE (UACMP) WITH MICROSCOPIC
Bacteria, UA: NONE SEEN
Bilirubin Urine: NEGATIVE
Glucose, UA: NEGATIVE mg/dL
Hgb urine dipstick: NEGATIVE
Ketones, ur: NEGATIVE mg/dL
Leukocytes, UA: NEGATIVE
Nitrite: NEGATIVE
Protein, ur: NEGATIVE mg/dL
Specific Gravity, Urine: 1.005 (ref 1.005–1.030)
Squamous Epithelial / HPF: NONE SEEN
pH: 7 (ref 5.0–8.0)

## 2016-12-05 LAB — LIPASE, BLOOD: Lipase: 24 U/L (ref 11–51)

## 2016-12-05 MED ORDER — IOPAMIDOL (ISOVUE-300) INJECTION 61%
15.0000 mL | INTRAVENOUS | Status: AC
  Administered 2016-12-05: 15 mL via ORAL

## 2016-12-05 MED ORDER — SODIUM CHLORIDE 0.9 % IV SOLN
Freq: Once | INTRAVENOUS | Status: AC
  Administered 2016-12-05: 14:00:00 via INTRAVENOUS

## 2016-12-05 MED ORDER — OXYCODONE-ACETAMINOPHEN 5-325 MG PO TABS: 1.0000 | ORAL_TABLET | Freq: Four times a day (QID) | ORAL | 0 refills | Status: DC | PRN

## 2016-12-05 MED ORDER — ONDANSETRON HCL 4 MG/2ML IJ SOLN
4.0000 mg | Freq: Once | INTRAMUSCULAR | Status: AC
  Administered 2016-12-05: 4 mg via INTRAVENOUS
  Filled 2016-12-05: qty 2

## 2016-12-05 MED ORDER — MORPHINE SULFATE (PF) 4 MG/ML IV SOLN
4.0000 mg | Freq: Once | INTRAVENOUS | Status: AC
  Administered 2016-12-05: 4 mg via INTRAVENOUS
  Filled 2016-12-05: qty 1

## 2016-12-05 MED ORDER — OXYCODONE-ACETAMINOPHEN 5-325 MG PO TABS
ORAL_TABLET | ORAL | Status: AC
  Administered 2016-12-05: 1
  Filled 2016-12-05: qty 1

## 2016-12-05 MED ORDER — IOPAMIDOL (ISOVUE-300) INJECTION 61%
75.0000 mL | Freq: Once | INTRAVENOUS | Status: AC | PRN
  Administered 2016-12-05: 75 mL via INTRAVENOUS
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
## 2016-12-05 NOTE — ED Notes (Signed)
Patient transported to CT 

## 2016-12-05 NOTE — ED Notes (Signed)
Pt assisted to bathroom and brief changed.

## 2016-12-05 NOTE — ED Provider Notes (Signed)
-----------------------------------------   5:41 PM on 12/05/2016 -----------------------------------------  Patient care assumed from Dr. Jimmye Norman. Patient CT scan has resulted in largely within normal limits. Patient continues with mild to moderate mid abdominal pain. Patient states the pain is usually resolved after belching. Patient has GI follow-up appointment on Tuesday. We will discharge with Percocet. I discussed in detail the pain medication and not to take this with Tylenol or hydrocodone. I discussed return precautions for any worsening pain or fever.   Harvest Dark, MD 12/05/16 [phone]
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
## 2016-12-05 NOTE — ED Notes (Signed)
Pt up to bathroom at this time,new brief placed on pt. Assisted pt back to bed.

## 2016-12-08 ENCOUNTER — Inpatient Hospital Stay
Admission: EM | Admit: 2016-12-08 | Discharge: 2016-12-11 | DRG: 310 | Disposition: A | Discharge status: Home / Self Care | Payer: Medicare Other | Attending: Internal Medicine | Admitting: Internal Medicine

## 2016-12-08 ENCOUNTER — Emergency Department: Payer: Medicare Other

## 2016-12-08 DIAGNOSIS — Z8249 Family history of ischemic heart disease and other diseases of the circulatory system: Secondary | ICD-10-CM | POA: Diagnosis not present

## 2016-12-08 DIAGNOSIS — Z853 Personal history of malignant neoplasm of breast: Secondary | ICD-10-CM | POA: Diagnosis not present

## 2016-12-08 DIAGNOSIS — Z880 Allergy status to penicillin: Secondary | ICD-10-CM | POA: Diagnosis not present

## 2016-12-08 DIAGNOSIS — I480 Paroxysmal atrial fibrillation: Secondary | ICD-10-CM | POA: Diagnosis present

## 2016-12-08 DIAGNOSIS — Z801 Family history of malignant neoplasm of trachea, bronchus and lung: Secondary | ICD-10-CM | POA: Diagnosis not present

## 2016-12-08 DIAGNOSIS — Z7901 Long term (current) use of anticoagulants: Secondary | ICD-10-CM | POA: Diagnosis not present

## 2016-12-08 DIAGNOSIS — I110 Hypertensive heart disease with heart failure: Secondary | ICD-10-CM | POA: Diagnosis present

## 2016-12-08 DIAGNOSIS — I4892 Unspecified atrial flutter: Principal | ICD-10-CM | POA: Diagnosis present

## 2016-12-08 DIAGNOSIS — Z9841 Cataract extraction status, right eye: Secondary | ICD-10-CM

## 2016-12-08 DIAGNOSIS — K219 Gastro-esophageal reflux disease without esophagitis: Secondary | ICD-10-CM | POA: Diagnosis present

## 2016-12-08 DIAGNOSIS — Z803 Family history of malignant neoplasm of breast: Secondary | ICD-10-CM | POA: Diagnosis not present

## 2016-12-08 DIAGNOSIS — Z9842 Cataract extraction status, left eye: Secondary | ICD-10-CM | POA: Diagnosis not present

## 2016-12-08 DIAGNOSIS — I509 Heart failure, unspecified: Secondary | ICD-10-CM | POA: Diagnosis present

## 2016-12-08 DIAGNOSIS — Z888 Allergy status to other drugs, medicaments and biological substances status: Secondary | ICD-10-CM | POA: Diagnosis not present

## 2016-12-08 DIAGNOSIS — R079 Chest pain, unspecified: Secondary | ICD-10-CM

## 2016-12-08 DIAGNOSIS — Z823 Family history of stroke: Secondary | ICD-10-CM

## 2016-12-08 DIAGNOSIS — I2510 Atherosclerotic heart disease of native coronary artery without angina pectoris: Secondary | ICD-10-CM | POA: Diagnosis present

## 2016-12-08 DIAGNOSIS — Z961 Presence of intraocular lens: Secondary | ICD-10-CM | POA: Diagnosis present

## 2016-12-08 DIAGNOSIS — Z9049 Acquired absence of other specified parts of digestive tract: Secondary | ICD-10-CM | POA: Diagnosis not present

## 2016-12-08 DIAGNOSIS — Z90710 Acquired absence of both cervix and uterus: Secondary | ICD-10-CM

## 2016-12-08 DIAGNOSIS — R1310 Dysphagia, unspecified: Secondary | ICD-10-CM | POA: Diagnosis present

## 2016-12-08 DIAGNOSIS — I482 Chronic atrial fibrillation: Secondary | ICD-10-CM | POA: Diagnosis present

## 2016-12-08 DIAGNOSIS — F329 Major depressive disorder, single episode, unspecified: Secondary | ICD-10-CM | POA: Diagnosis present

## 2016-12-08 DIAGNOSIS — Z79899 Other long term (current) drug therapy: Secondary | ICD-10-CM

## 2016-12-08 DIAGNOSIS — Z9012 Acquired absence of left breast and nipple: Secondary | ICD-10-CM | POA: Diagnosis not present

## 2016-12-08 DIAGNOSIS — Z96641 Presence of right artificial hip joint: Secondary | ICD-10-CM | POA: Diagnosis present

## 2016-12-08 DIAGNOSIS — K5909 Other constipation: Secondary | ICD-10-CM | POA: Diagnosis present

## 2016-12-08 DIAGNOSIS — Z7982 Long term (current) use of aspirin: Secondary | ICD-10-CM

## 2016-12-08 LAB — COMPREHENSIVE METABOLIC PANEL
ALBUMIN: 3.3 g/dL — AB (ref 3.5–5.0)
ALK PHOS: 143 U/L — AB (ref 38–126)
ALT: 84 U/L — ABNORMAL HIGH (ref 14–54)
AST: 39 U/L (ref 15–41)
Anion gap: 9 (ref 5–15)
BILIRUBIN TOTAL: 0.4 mg/dL (ref 0.3–1.2)
BUN: 19 mg/dL (ref 6–20)
CO2: 23 mmol/L (ref 22–32)
Calcium: 8.5 mg/dL — ABNORMAL LOW (ref 8.9–10.3)
Chloride: 106 mmol/L (ref 101–111)
Creatinine, Ser: 1.05 mg/dL — ABNORMAL HIGH (ref 0.44–1.00)
GFR calc Af Amer: 54 mL/min — ABNORMAL LOW (ref >60)
GFR calc non Af Amer: 47 mL/min — ABNORMAL LOW (ref >60)
GLUCOSE: 157 mg/dL — AB (ref 65–99)
POTASSIUM: 3.8 mmol/L (ref 3.5–5.1)
Sodium: 138 mmol/L (ref 135–145)
TOTAL PROTEIN: 6.5 g/dL (ref 6.5–8.1)

## 2016-12-08 LAB — CBC
HEMATOCRIT: 35.4 % (ref 35.0–47.0)
Hemoglobin: 12 g/dL (ref 12.0–16.0)
MCH: 32.5 pg (ref 26.0–34.0)
MCHC: 34 g/dL (ref 32.0–36.0)
MCV: 95.5 fL (ref 80.0–100.0)
PLATELETS: 371 10*3/uL (ref 150–440)
RBC: 3.7 MIL/uL — ABNORMAL LOW (ref 3.80–5.20)
RDW: 16.4 % — ABNORMAL HIGH (ref 11.5–14.5)
WBC: 8.3 10*3/uL (ref 3.6–11.0)

## 2016-12-08 LAB — PROTIME-INR
INR: 1.56
Prothrombin Time: 18.8 seconds — ABNORMAL HIGH (ref 11.4–15.2)

## 2016-12-08 LAB — APTT: aPTT: 35 seconds (ref 24–36)

## 2016-12-08 LAB — MAGNESIUM: MAGNESIUM: 2 mg/dL (ref 1.7–2.4)

## 2016-12-08 LAB — TSH: TSH: 2.187 u[IU]/mL (ref 0.350–4.500)

## 2016-12-08 LAB — TROPONIN I
TROPONIN I: 0.03 ng/mL — AB (ref <0.03)
Troponin I: <0.03 ng/mL (ref <0.03)
Troponin I: 0.04 ng/mL (ref <0.03)

## 2016-12-08 LAB — BRAIN NATRIURETIC PEPTIDE: B Natriuretic Peptide: 102 pg/mL — ABNORMAL HIGH (ref 0.0–100.0)

## 2016-12-08 MED ORDER — NITROGLYCERIN 0.4 MG SL SUBL: 0.4000 mg | SUBLINGUAL_TABLET | SUBLINGUAL | Status: DC | PRN

## 2016-12-08 MED ORDER — METOPROLOL TARTRATE 25 MG PO TABS
25.0000 mg | ORAL_TABLET | Freq: Two times a day (BID) | ORAL | Status: DC
  Administered 2016-12-08 – 2016-12-11 (×6): 25 mg via ORAL
  Filled 2016-12-08 (×6): qty 1

## 2016-12-08 MED ORDER — OXYCODONE-ACETAMINOPHEN 5-325 MG PO TABS
1.0000 | ORAL_TABLET | Freq: Four times a day (QID) | ORAL | Status: DC | PRN
  Administered 2016-12-09 – 2016-12-10 (×3): 1 via ORAL
  Filled 2016-12-08 (×3): qty 1

## 2016-12-08 MED ORDER — DOCUSATE SODIUM 100 MG PO CAPS: 100.0000 mg | ORAL_CAPSULE | Freq: Every day | ORAL | Status: DC | PRN

## 2016-12-08 MED ORDER — MIRTAZAPINE 15 MG PO TABS
15.0000 mg | ORAL_TABLET | Freq: Every day | ORAL | Status: DC
  Administered 2016-12-08 – 2016-12-10 (×3): 15 mg via ORAL
  Filled 2016-12-08 (×3): qty 1

## 2016-12-08 MED ORDER — PANTOPRAZOLE SODIUM 40 MG PO TBEC
40.0000 mg | DELAYED_RELEASE_TABLET | Freq: Two times a day (BID) | ORAL | Status: DC
  Administered 2016-12-09 – 2016-12-11 (×5): 40 mg via ORAL
  Filled 2016-12-08 (×5): qty 1

## 2016-12-08 MED ORDER — METOPROLOL TARTRATE 5 MG/5ML IV SOLN
5.0000 mg | Freq: Once | INTRAVENOUS | Status: AC
Start: 2016-12-08 — End: 2016-12-08
  Administered 2016-12-08: 5 mg via INTRAVENOUS
  Filled 2016-12-08: qty 5

## 2016-12-08 MED ORDER — DILTIAZEM HCL 25 MG/5ML IV SOLN
15.0000 mg | INTRAVENOUS | Status: AC
  Administered 2016-12-08: 15 mg via INTRAVENOUS
  Filled 2016-12-08 (×2): qty 5

## 2016-12-08 MED ORDER — METOPROLOL TARTRATE 5 MG/5ML IV SOLN
5.0000 mg | Freq: Once | INTRAVENOUS | Status: AC
  Administered 2016-12-08: 5 mg via INTRAVENOUS
  Filled 2016-12-08: qty 5

## 2016-12-08 MED ORDER — SODIUM CHLORIDE 0.9% FLUSH
3.0000 mL | Freq: Two times a day (BID) | INTRAVENOUS | Status: DC
  Administered 2016-12-09 – 2016-12-11 (×5): 3 mL via INTRAVENOUS

## 2016-12-08 MED ORDER — SODIUM CHLORIDE 0.9 % IV BOLUS (SEPSIS)
500.0000 mL | INTRAVENOUS | Status: AC
  Administered 2016-12-08: 500 mL via INTRAVENOUS

## 2016-12-08 MED ORDER — DEXTROSE 5 % IV SOLN
5.0000 mg/h | Freq: Once | INTRAVENOUS | Status: AC
  Administered 2016-12-08: 5 mg/h via INTRAVENOUS
  Filled 2016-12-08: qty 100

## 2016-12-08 MED ORDER — DILTIAZEM HCL 25 MG/5ML IV SOLN
15.0000 mg | INTRAVENOUS | Status: AC
  Administered 2016-12-08: 15 mg via INTRAVENOUS
  Filled 2016-12-08: qty 5

## 2016-12-08 MED ORDER — HEPARIN (PORCINE) IN NACL 100-0.45 UNIT/ML-% IJ SOLN
700.0000 [IU]/h | INTRAMUSCULAR | Status: DC
  Filled 2016-12-08: qty 250

## 2016-12-08 MED ORDER — ONDANSETRON HCL 4 MG PO TABS: 4.0000 mg | ORAL_TABLET | Freq: Four times a day (QID) | ORAL | Status: DC | PRN

## 2016-12-08 MED ORDER — ESCITALOPRAM OXALATE 10 MG PO TABS
10.0000 mg | ORAL_TABLET | Freq: Every day | ORAL | Status: DC
  Administered 2016-12-08 – 2016-12-10 (×3): 10 mg via ORAL
  Filled 2016-12-08 (×3): qty 1

## 2016-12-08 MED ORDER — WARFARIN - PHARMACIST DOSING INPATIENT
Freq: Every day | Status: DC
  Administered 2016-12-09: 18:00:00
  Filled 2016-12-08 (×2): qty 1

## 2016-12-08 MED ORDER — VITAMIN B-12 1000 MCG PO TABS
1000.0000 ug | ORAL_TABLET | Freq: Every day | ORAL | Status: DC
  Administered 2016-12-09 – 2016-12-10 (×2): 1000 ug via ORAL
  Filled 2016-12-08 (×2): qty 1

## 2016-12-08 MED ORDER — DILTIAZEM HCL 100 MG IV SOLR
5.0000 mg/h | INTRAVENOUS | Status: DC
  Administered 2016-12-08 – 2016-12-09 (×2): 10 mg/h via INTRAVENOUS
  Filled 2016-12-08: qty 100

## 2016-12-08 MED ORDER — CALCIUM CARBONATE-VITAMIN D 500-200 MG-UNIT PO TABS
1.0000 | ORAL_TABLET | Freq: Two times a day (BID) | ORAL | Status: DC
  Administered 2016-12-08 – 2016-12-11 (×6): 1 via ORAL
  Filled 2016-12-08 (×6): qty 1

## 2016-12-08 MED ORDER — ONDANSETRON HCL 4 MG/2ML IJ SOLN: 4.0000 mg | Freq: Four times a day (QID) | INTRAMUSCULAR | Status: DC | PRN

## 2016-12-08 MED ORDER — RISAQUAD PO CAPS
1.0000 | ORAL_CAPSULE | Freq: Every day | ORAL | Status: DC
  Administered 2016-12-09 – 2016-12-11 (×3): 1 via ORAL
  Filled 2016-12-08 (×3): qty 1

## 2016-12-08 MED ORDER — LORATADINE 10 MG PO TABS
10.0000 mg | ORAL_TABLET | Freq: Every day | ORAL | Status: DC
  Administered 2016-12-09 – 2016-12-11 (×3): 10 mg via ORAL
  Filled 2016-12-08 (×3): qty 1

## 2016-12-08 MED ORDER — ALPRAZOLAM 0.25 MG PO TABS
0.2500 mg | ORAL_TABLET | Freq: Every evening | ORAL | Status: DC | PRN
  Administered 2016-12-09: 0.25 mg via ORAL
  Filled 2016-12-08: qty 1

## 2016-12-08 MED ORDER — ASPIRIN EC 81 MG PO TBEC
81.0000 mg | DELAYED_RELEASE_TABLET | Freq: Every day | ORAL | Status: DC
  Administered 2016-12-09 – 2016-12-11 (×3): 81 mg via ORAL
  Filled 2016-12-08 (×3): qty 1

## 2016-12-08 MED ORDER — WARFARIN SODIUM 4 MG PO TABS
4.0000 mg | ORAL_TABLET | Freq: Once | ORAL | Status: AC
  Administered 2016-12-08: 4 mg via ORAL
  Filled 2016-12-08 (×2): qty 1

## 2016-12-08 MED ORDER — ALUM & MAG HYDROXIDE-SIMETH 200-200-20 MG/5ML PO SUSP
30.0000 mL | Freq: Four times a day (QID) | ORAL | Status: DC | PRN
  Administered 2016-12-08: 30 mL via ORAL
  Filled 2016-12-08: qty 30

## 2016-12-08 MED ORDER — ACETAMINOPHEN 500 MG PO TABS
1000.0000 mg | ORAL_TABLET | Freq: Four times a day (QID) | ORAL | Status: DC | PRN
  Administered 2016-12-08: 1000 mg via ORAL
  Filled 2016-12-08: qty 2

## 2016-12-08 MED ORDER — OCUVITE-LUTEIN PO CAPS
1.0000 | ORAL_CAPSULE | Freq: Two times a day (BID) | ORAL | Status: DC
  Administered 2016-12-08 – 2016-12-11 (×5): 1 via ORAL
  Filled 2016-12-08 (×6): qty 1

## 2016-12-08 MED ORDER — WARFARIN SODIUM 1 MG PO TABS: 3.0000 mg | ORAL_TABLET | Freq: Every day | ORAL | Status: DC

## 2016-12-08 NOTE — ED Triage Notes (Signed)
Pt to ED from home via [facility] c/o abdominal pain & chest pain. Per EMS pt initially c/o centralized chest pain radiating to her right arm starting right before 1300, and she took an additional 25 mg of metoprolol, in addition to 3 nitro. Upon EMS arrival pt reported chest pain subsided, but she now has RLQ abdominal pain that radiates to groin and back. Pt alert and oriented, tachycardic, talking in complete sentnces but in no acute distress at this time.

## 2016-12-08 NOTE — H&P (Signed)
[facility] [place] NAME: Peggy [name]    MR#:  [id number]  DATE OF BIRTH:  1928-11-17  DATE OF ADMISSION:  12/08/2016  PRIMARY CARE PHYSICIAN: Maryland Pink, MD   REQUESTING/REFERRING PHYSICIAN: Dr. Hinda Kehr  CHIEF COMPLAINT:   Chief Complaint  Patient presents with  . Abdominal Pain  . Chest Pain    HISTORY OF PRESENT ILLNESS:  Peggy [name]  is a 82 y.o. female with a known history of CAD, history of atrial fibrillation on Coumadin, hypertension, GERD presents to hospital secondary to chest pain, heartburn and constant belching. Her heart rate was noted to be in the 150s, atrial flutter and did not respond to 3 doses of IV Cardizem. The patient is being admitted for further heart rate controlled. Patient has been having trouble with right upper quadrant abdominal pain and constant belching going on for almost 4 weeks now. She has seen her PCP and was started on Protonix instead of lansoprazole. When she gets chest pain, it's hard to tell if it's from her reflux disease or chest pain related to her heart. This afternoon patient started to feel worsening of her chest pain associated with palpitations and new that her heart was racing and called her daughter who brought her to the emergency room. As mentioned about, heart rate was very high and did not respond to IV Cardizem. Patient was being worked up for her abdominal pain and has a GI appointment tomorrow. Because of recurrent chest pain complaints, she was supposed to get to stress test as an outpatient this week. Denies any vomiting or melena. The chest pain and reflux make her nauseous. No fevers or chills. She is being started on a Cardizem drip.  PAST MEDICAL HISTORY:   Past Medical History:  Diagnosis Date  . Allergy   . Anginal pain ([place])    c/o angina on May 04, 2016 releived by Nitroglycerin X 2  . Anxiety   . Arthritis   . Atrial fibrillation ([place])   . Atrial  fibrillation ([place])   . Atrial fibrillation with rapid ventricular response [facility]) November 2015   Heart rate up to 140 requiring IV Cardizem  . Breast cancer [facility]) 2012   Patient underwent a left mastectomy on 03-31-11. Pathology showed DCIS at both sites without an invasive component. Sentinel nodes were negative. The breast lesions were noted to be ER +40%, PR-positive, 5%.   . Breast screening, unspecified   . Coronary artery disease   . Diverticulosis   . Dysrhythmia   . Essential hypertension, benign   . GERD (gastroesophageal reflux disease)   . Hearing disorder 2012   hearing aids  . Heart attack 2001  . Heart disease 2001  . Hip pain   . Hyperlipidemia   . Hypertension 1996  . Lump or mass in breast   . Malignant neoplasm of upper-inner quadrant of female breast ([place])   . Malignant neoplasm of upper-outer quadrant of female breast ([place])   . Other benign neoplasm of connective and other soft tissue of thorax   . Personal history of malignant neoplasm of breast   . Reflux    acid reflux  . Shortness of breath dyspnea    with exertion  . Special screening for malignant neoplasms, colon   . Stroke [facility]) 2009  . Unspecified essential hypertension     PAST SURGICAL HISTORY:   Past Surgical History:  Procedure Laterality Date  . ABDOMINAL HYSTERECTOMY  age 54  . APPENDECTOMY     age 26  . BREAST BIOPSY     left breast biopsy over 15 years ago   . CHOLECYSTECTOMY     40 years ago   . COLON SURGERY     Dr. Tamala Julian, [facility]  . COLONOSCOPY  2009   [facility] Dr. Donnella Sham  . EXCISION OF BACK LESION Left 05/09/2016   Procedure: EXCISION OF GLUTEAL MASS;  Surgeon: Robert Bellow, MD;  Location: [facility] ORS;  Service: General;  Laterality: Left;  . EYE SURGERY Bilateral    Cataract Extraction with IOL  . HERNIA REPAIR    . JOINT REPLACEMENT Right    TOTAL HIP REPLACEMENT  . MASTECTOMY  2012   left breast  . SMALL INTESTINE SURGERY  12/31/2012   Abdominal exploration, lysis of  adhesions for distal small bowel obstruction. Rochel Brome, MD  . TONSILLECTOMY     as a child  . TOTAL HIP ARTHROPLASTY Right 02/27/2016   Procedure: TOTAL HIP ARTHROPLASTY;  Surgeon: Dereck Leep, MD;  Location: [facility] ORS;  Service: Orthopedics;  Laterality: Right;  . UPPER GI ENDOSCOPY  2009   [facility] Dr. Donnella Sham    SOCIAL HISTORY:   Social History  Substance Use Topics  . Smoking status: Never Smoker  . Smokeless tobacco: Never Used  . Alcohol use No    FAMILY HISTORY:   Family History  Problem Relation Age of Onset  . Heart disease Mother   . Stroke Father   . Heart disease Father   . Breast cancer Sister     x 2, in their 3's  . Lung cancer Brother   . Colon cancer Neg Hx   . Ovarian cancer Neg Hx     DRUG ALLERGIES:   Allergies  Allergen Reactions  . Alendronate Sodium     REACTION: questionable  . Amoxicillin-Pot Clavulanate   . Etodolac   . Ibandronate Sodium     REACTION: questionable  . Iodine     Other reaction(s): Unknown  . Lactose Intolerance (Gi) Other (See Comments)    GI problems  . Minocycline Hcl   . Risedronate Sodium   . Sulfonamide Derivatives Other (See Comments)    Stomach pain  . Zoledronic Acid     REACTION: "dental problems"    REVIEW OF SYSTEMS:   Review of Systems  Constitutional: Negative for chills, fever, malaise/fatigue and weight loss.  HENT: Negative for ear discharge, ear pain, hearing loss and nosebleeds.   Eyes: Negative for blurred vision, double vision and photophobia.  Respiratory: Positive for shortness of breath. Negative for cough, hemoptysis and wheezing.   Cardiovascular: Positive for chest pain and palpitations. Negative for orthopnea and leg swelling.  Gastrointestinal: Positive for abdominal pain, heartburn and nausea. Negative for constipation, diarrhea, melena and vomiting.  Genitourinary: Negative for dysuria, frequency and urgency.  Musculoskeletal: Negative for back pain, myalgias and neck pain.    Skin: Negative for rash.  Neurological: Positive for weakness. Negative for dizziness, tremors, sensory change, speech change, focal weakness and headaches.  Endo/Heme/Allergies: Does not bruise/bleed easily.  Psychiatric/Behavioral: Negative for depression.    MEDICATIONS AT HOME:   Prior to Admission medications   Medication Sig Start Date End Date Taking? Authorizing Provider  acetaminophen (TYLENOL) 500 MG tablet Take 1,000 mg by mouth every 6 (six) hours as needed for mild pain.     Historical Provider, MD  ALPRAZolam Duanne Moron) 0.25 MG tablet Take 0.25 mg by mouth at bedtime as needed  for anxiety or sleep.     Historical Provider, MD  aspirin EC 81 MG tablet Take 81 mg by mouth daily.    Historical Provider, MD  Calcium Carbonate-Vitamin D (RA CALCIUM PLUS VITAMIN D) 600-400 MG-UNIT tablet Take 1 tablet by mouth 2 (two) times daily.    Historical Provider, MD  diltiazem (DILACOR XR) 180 MG 24 hr capsule Take 180 mg by mouth daily.    Historical Provider, MD  docusate sodium (COLACE) 100 MG capsule Take 100 mg by mouth daily as needed for mild constipation.    Historical Provider, MD  escitalopram (LEXAPRO) 10 MG tablet Take 10 mg by mouth at bedtime.     Historical Provider, MD  fexofenadine (ALLEGRA) 180 MG tablet Take 180 mg by mouth as needed for allergies or rhinitis.    Historical Provider, MD  lansoprazole (PREVACID) 15 MG capsule Take 30 mg by mouth daily.    Historical Provider, MD  Menthol, Topical Analgesic, (BENGAY VANISHING SCENT) 2.5 % GEL Apply topically as needed (for pain).     Historical Provider, MD  metoprolol tartrate (LOPRESSOR) 25 MG tablet Take 25 mg by mouth 2 (two) times daily.     Historical Provider, MD  mirtazapine (REMERON) 15 MG tablet Take 15 mg by mouth at bedtime.    Historical Provider, MD  Multiple Vitamins-Minerals (CENTRUM SILVER PO) Take 1 tablet by mouth daily.    Historical Provider, MD  Multiple Vitamins-Minerals (PRESERVISION AREDS 2 PO) Take 1  tablet by mouth 2 (two) times daily.    Historical Provider, MD  nitroGLYCERIN (NITROSTAT) 0.4 MG SL tablet Place 0.4 mg under the tongue every 5 (five) minutes as needed for chest pain.     Historical Provider, MD  oxyCODONE-acetaminophen (ROXICET) 5-325 MG tablet Take 1 tablet by mouth every 6 (six) hours as needed. 12/05/16   Harvest Dark, MD  Probiotic Product (PROBIOTIC DAILY PO) Take 1 tablet by mouth daily.     Historical Provider, MD  traMADol (ULTRAM) 50 MG tablet Take 1-2 tablets (50-100 mg total) by mouth every 4 (four) hours as needed for moderate pain. 02/28/16   Watt Climes, PA  vitamin B-12 (CYANOCOBALAMIN) 1000 MCG tablet Take 1,000 mcg by mouth at bedtime.     Historical Provider, MD  warfarin (COUMADIN) 1 MG tablet 3 mg daily.  06/02/16   Historical Provider, MD      VITAL SIGNS:  Blood pressure 125/81, pulse (!) 141, temperature 98 F (36.7 C), temperature source Oral, resp. rate (!) 22, height 4\' 8"  (1.422 m), weight 50.3 kg (111 lb), SpO2 95 %.  PHYSICAL EXAMINATION:   Physical Exam  GENERAL:  82 y.o.-year-old elderly patient lying in the bed with no acute distress.  EYES: Pupils equal, round, reactive to light and accommodation. No scleral icterus. Extraocular muscles intact.  HEENT: Head atraumatic, normocephalic. Oropharynx and nasopharynx clear.  NECK:  Supple, no jugular venous distention. No thyroid enlargement, no tenderness.  LUNGS: Normal breath sounds bilaterally, no wheezing, rales,rhonchi or crepitation. No use of accessory muscles of respiration.  CARDIOVASCULAR: S1, S2 rapid and irregular. No murmurs, rubs, or gallops.  ABDOMEN: Soft, tender in the right upper quadrant with voluntary guarding,, nondistended. Bowel sounds present. No organomegaly or mass.  EXTREMITIES: No pedal edema, cyanosis, or clubbing.  NEUROLOGIC: Cranial nerves II through XII are intact. Muscle strength 5/5 in all extremities. Sensation intact. Gait not checked.  PSYCHIATRIC: The  patient is alert and oriented x 3.  SKIN: No obvious rash, lesion,  or ulcer.   LABORATORY PANEL:   CBC  Recent Labs Lab 12/08/16 1437  WBC 8.3  HGB 12.0  HCT 35.4  PLT 371   ------------------------------------------------------------------------------------------------------------------  Chemistries   Recent Labs Lab 12/08/16 1437  NA 138  K 3.8  CL 106  CO2 23  GLUCOSE 157*  BUN 19  CREATININE 1.05*  CALCIUM 8.5*  MG 2.0  AST 39  ALT 84*  ALKPHOS 143*  BILITOT 0.4   ------------------------------------------------------------------------------------------------------------------  Cardiac Enzymes  Recent Labs Lab 12/08/16 1807  TROPONINI 0.03*   ------------------------------------------------------------------------------------------------------------------  RADIOLOGY:  Dg Chest Port 1 View  Result Date: 12/08/2016 CLINICAL DATA:  Chest pain. EXAM: PORTABLE CHEST 1 VIEW COMPARISON:  11/27/2016.  01/15/2016.  03/11/2015. FINDINGS: Mediastinum and hilar structures normal. Stable cardiomegaly. Mild bilateral from interstitial prominence. Mild CHF cannot be excluded . No pleural effusion or pneumothorax . IMPRESSION: Stable cardiomegaly. Mild pulmonary interstitial edema cannot be excluded. Electronically Signed   By: Marcello Moores  Register   On: 12/08/2016 14:39    EKG:   Orders placed or performed during the hospital encounter of 12/08/16  . EKG 12-Lead  . EKG 12-Lead  . ED EKG  . ED EKG    IMPRESSION AND PLAN:   Peggy [name]  is a 82 y.o. female with a known history of CAD, history of atrial fibrillation on Coumadin, hypertension, GERD presents to hospital secondary to chest pain, heartburn and constant belching. Her heart rate was noted to be in the 150s, atrial flutter and did not respond to 3 doses of IV Cardizem. The patient is being admitted for further heart rate controlled.  #1 atrial fibrillation-with rapid ventricular response. -Started on  Cardizem drip -Check TSH and magnesium. Cardiology consulted. -Continue metoprolol and adjust dose if needed. Echocardiogram has been ordered. -Monitor on telemetry -Takes warfarin for anticoagulation, subtherapeutic now. Dose adjusted and being bridged with heparin.  #2 right upper quadrant abdominal pain with belching-being worked up as outpatient. But worsened in the last couple of weeks. -CT of the abdomen done 3 days ago showing chronic changes. -Family requesting GI consult as patient is supposed to see Dr. Gustavo Lah in the next couple of days. -Added Maalox prn for her belching, started her on Protonix twice a day. -She is status post cholecystectomy.  #3 degenerative disc disease with severe back pain-follows with physiatrist as outpatient for injections in the back. -Continue pain medication as needed  #4 CAD-stable. Continue home medications  #5 depression-continue Remeron and Lexapro.  #6 DVT prophylaxis-on heparin drip and Coumadin. Pharmacy to adjust the dose.   All the records are reviewed and case discussed with ED provider. Management plans discussed with the patient, family and they are in agreement.  CODE STATUS: Full code  TOTAL TIME TAKING CARE OF THIS PATIENT: 50 minutes.    Gladstone Lighter M.D on 12/08/2016 at 7:52 PM  Between 7am to 6pm - Pager - [phone]  After 6pm go to [email or web address] - password [email or web address] [place] [facility]  Office  [phone]  CC: Primary care physician; Maryland Pink, MD

## 2016-12-08 NOTE — ED Provider Notes (Signed)
[facility] Emergency Department Provider Note  ____________________________________________   First MD Initiated Contact with Patient 12/08/16 1433     (approximate)  I have reviewed the triage vital signs and the nursing notes.   HISTORY  Chief Complaint Abdominal Pain and Chest Pain    HPI Peggy [name] is a 82 y.o. female who presents by EMS for evaluation of palpitations, rapid heartbeat, and chest pain.Reportedly the symptoms started acutely about 2 hours ago and started with a rapid heartbeat.  Shortly thereafter she developed centralized chest pain that feels dull and pressure-like.  It radiates into her right arm.  She has a history of chronic atrial fibrillation and takes metoprolol so she took an extra dose of 25 mg of metoprolol tartrate as well as taking 3 nitroglycerin sublingual tablets.  EMS arrived and she told them that her chest pain was better but she was having pain in the right side of her abdomen, however she and her daughter report that she has had this pain for an extended period of time, has had extensive workups in the emergency department and has follow-up scheduled with GI.  EMS contacted the emergency Department because they are concerned about the possibility of STEMI but her EMS EKG did not meet criteria and she was brought directly to an exam room.  Her triage EKG looks better than the EMS rhythm strips and she is currently having no chest pain.  Denies shortness of breath, nausea, vomiting, dysuria.  Nothing in particular made her symptoms better nor worse.  Tachycardic at this time.   Past Medical History:  Diagnosis Date  . Allergy   . Anginal pain ([place])    c/o angina on May 04, 2016 releived by Nitroglycerin X 2  . Anxiety   . Arthritis   . Atrial fibrillation ([place])   . Atrial fibrillation ([place])   . Atrial fibrillation with rapid ventricular response [facility]) November 2015   Heart rate up to 140 requiring IV Cardizem    . Breast cancer [facility]) 2012   Patient underwent a left mastectomy on 03-31-11. Pathology showed DCIS at both sites without an invasive component. Sentinel nodes were negative. The breast lesions were noted to be ER +40%, PR-positive, 5%.   . Breast screening, unspecified   . Coronary artery disease   . Diverticulosis   . Dysrhythmia   . Essential hypertension, benign   . GERD (gastroesophageal reflux disease)   . Hearing disorder 2012   hearing aids  . Heart attack 2001  . Heart disease 2001  . Hip pain   . Hyperlipidemia   . Hypertension 1996  . Lump or mass in breast   . Malignant neoplasm of upper-inner quadrant of female breast ([place])   . Malignant neoplasm of upper-outer quadrant of female breast ([place])   . Other benign neoplasm of connective and other soft tissue of thorax   . Personal history of malignant neoplasm of breast   . Reflux    acid reflux  . Shortness of breath dyspnea    with exertion  . Special screening for malignant neoplasms, colon   . Stroke [facility]) 2009  . Unspecified essential hypertension     Patient Active Problem List   Diagnosis Date Noted  . S/P total hip arthroplasty 02/27/2016  . Hip pain 01/15/2016  . Memory loss 03/21/2015  . Abnormality of gait 03/21/2015  . Atrial fibrillation with RVR ([place]) 03/11/2015  . H/O: CVA (cerebrovascular accident) 03/11/2015  . 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 Blurred vision 09/05/2013  . 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 EPIGASTRIC PAIN 02/14/2009  . NONSPECIFIC ABN FINDNG RAD&OTH EXAM BILARY TRCT 02/14/2009    Past Surgical History:  Procedure Laterality Date  . ABDOMINAL HYSTERECTOMY     age 85  . APPENDECTOMY     age 44  . BREAST BIOPSY     left breast biopsy over 15 years ago   . CHOLECYSTECTOMY     40 years ago   . COLON SURGERY     Dr. Tamala Julian, [facility]  . COLONOSCOPY  2009   [facility] Dr. Donnella Sham  . EXCISION OF  BACK LESION Left 05/09/2016   Procedure: EXCISION OF GLUTEAL MASS;  Surgeon: Robert Bellow, MD;  Location: [facility] ORS;  Service: General;  Laterality: Left;  . EYE SURGERY Bilateral    Cataract Extraction with IOL  . HERNIA REPAIR    . JOINT REPLACEMENT Right    TOTAL HIP REPLACEMENT  . MASTECTOMY  2012   left breast  . SMALL INTESTINE SURGERY  12/31/2012   Abdominal exploration, lysis of adhesions for distal small bowel obstruction. Rochel Brome, MD  . TONSILLECTOMY     as a child  . TOTAL HIP ARTHROPLASTY Right 02/27/2016   Procedure: TOTAL HIP ARTHROPLASTY;  Surgeon: Dereck Leep, MD;  Location: [facility] ORS;  Service: Orthopedics;  Laterality: Right;  . UPPER GI ENDOSCOPY  2009   [facility] Dr. Donnella Sham    Prior to Admission medications   Medication Sig Start Date End Date Taking? Authorizing Provider  acetaminophen (TYLENOL) 500 MG tablet Take 1,000 mg by mouth every 6 (six) hours as needed for mild pain.     Historical Provider, MD  ALPRAZolam Duanne Moron) 0.25 MG tablet Take 0.25 mg by mouth at bedtime as needed for anxiety or sleep.     Historical Provider, MD  aspirin EC 81 MG tablet Take 81 mg by mouth daily.    Historical Provider, MD  Calcium Carbonate-Vitamin D (RA CALCIUM PLUS VITAMIN D) 600-400 MG-UNIT tablet Take 1 tablet by mouth 2 (two) times daily.    Historical Provider, MD  diltiazem (DILACOR XR) 180 MG 24 hr capsule Take 180 mg by mouth daily.    Historical Provider, MD  docusate sodium (COLACE) 100 MG capsule Take 100 mg by mouth daily as needed for mild constipation.    Historical Provider, MD  escitalopram (LEXAPRO) 10 MG tablet Take 10 mg by mouth at bedtime.     Historical Provider, MD  fexofenadine (ALLEGRA) 180 MG tablet Take 180 mg by mouth as needed for allergies or rhinitis.    Historical Provider, MD  lansoprazole (PREVACID) 15 MG capsule Take 30 mg by mouth daily.    Historical Provider, MD  Menthol, Topical Analgesic, (BENGAY VANISHING SCENT) 2.5 % GEL Apply  topically as needed (for pain).     Historical Provider, MD  metoprolol tartrate (LOPRESSOR) 25 MG tablet Take 25 mg by mouth 2 (two) times daily.     Historical Provider, MD  mirtazapine (REMERON) 15 MG tablet Take 15 mg by mouth at bedtime.    Historical Provider, MD  Multiple Vitamins-Minerals (CENTRUM SILVER PO) Take 1 tablet by mouth daily.    Historical Provider, MD  Multiple Vitamins-Minerals (PRESERVISION AREDS 2 PO) Take 1 tablet by mouth 2 (two) times daily.    Historical Provider, MD  nitroGLYCERIN (NITROSTAT) 0.4 MG SL tablet Place 0.4 mg under  the tongue every 5 (five) minutes as needed for chest pain.     Historical Provider, MD  oxyCODONE-acetaminophen (ROXICET) 5-325 MG tablet Take 1 tablet by mouth every 6 (six) hours as needed. 12/05/16   Harvest Dark, MD  Probiotic Product (PROBIOTIC DAILY PO) Take 1 tablet by mouth daily.     Historical Provider, MD  traMADol (ULTRAM) 50 MG tablet Take 1-2 tablets (50-100 mg total) by mouth every 4 (four) hours as needed for moderate pain. 02/28/16   Watt Climes, PA  vitamin B-12 (CYANOCOBALAMIN) 1000 MCG tablet Take 1,000 mcg by mouth at bedtime.     Historical Provider, MD  warfarin (COUMADIN) 1 MG tablet 3 mg daily.  06/02/16   Historical Provider, MD    Allergies Alendronate sodium; Amoxicillin-pot clavulanate; Etodolac; Ibandronate sodium; Iodine; Lactose intolerance (gi); Minocycline hcl; Risedronate sodium; Sulfonamide derivatives; and Zoledronic acid  Family History  Problem Relation Age of Onset  . Heart disease Mother   . Stroke Father   . Heart disease Father   . Breast cancer Sister     x 2, in their 46's  . Lung cancer Brother   . Colon cancer Neg Hx   . Ovarian cancer Neg Hx     Social History Social History  Substance Use Topics  . Smoking status: Never Smoker  . Smokeless tobacco: Never Used  . Alcohol use No    Review of Systems Constitutional: No fever/chills Eyes: No visual changes. ENT: No sore  throat. Cardiovascular: Palpitations and chest discomfort Respiratory: Denies shortness of breath. Gastrointestinal: Chronic abdominal pain that comes and goes.  No nausea, no vomiting.  No diarrhea.  No constipation. Genitourinary: Negative for dysuria. Musculoskeletal: Pain in her right arm Skin: Negative for rash. Neurological: Negative for headaches, focal weakness or numbness.  10-point ROS otherwise negative.  ____________________________________________   PHYSICAL EXAM:  VITAL SIGNS: ED Triage Vitals  Enc Vitals Group     BP 12/08/16 1407 114/74     Pulse Rate 12/08/16 1407 (!) 125     Resp 12/08/16 1407 (!) 24     Temp 12/08/16 1407 98 F (36.7 C)     Temp Source 12/08/16 1407 Oral     SpO2 12/08/16 1407 97 %     Weight 12/08/16 1408 111 lb (50.3 kg)     Height 12/08/16 1408 4\' 8"  (1.422 m)     Head Circumference --      Peak Flow --      Pain Score 12/08/16 1408 2     Pain Loc --      Pain Edu? --      Excl. in [place]? --     Constitutional: Alert and oriented. Well appearing and in no acute distress. Eyes: Conjunctivae are normal. PERRL. EOMI. Head: Atraumatic. Nose: No congestion/rhinnorhea. Mouth/Throat: Mucous membranes are moist.  Oropharynx non-erythematous. Neck: No stridor.  No meningeal signs.  No cervical spine tenderness to palpation. Cardiovascular: Tachycardia, regular rhythm. Good peripheral circulation. Grossly normal heart sounds. Respiratory: Normal respiratory effort.  No retractions. Lungs CTAB. Gastrointestinal: Soft and nontender. No distention.  Musculoskeletal: No lower extremity tenderness nor edema. No gross deformities of extremities. Neurologic:  Normal speech and language. No gross focal neurologic deficits are appreciated.  Skin:  Skin is warm, dry and intact. No rash noted. Psychiatric: Mood and affect are normal. Speech and behavior are normal.  ____________________________________________   LABS (all labs ordered are listed,  but only abnormal results are displayed)  Labs Reviewed  CBC - Abnormal; Notable for the following:       Result Value   RBC 3.70 (*)    RDW 16.4 (*)    All other components within normal limits  COMPREHENSIVE METABOLIC PANEL - Abnormal; Notable for the following:    Glucose, Bld 157 (*)    Creatinine, Ser 1.05 (*)    Calcium 8.5 (*)    Albumin 3.3 (*)    ALT 84 (*)    Alkaline Phosphatase 143 (*)    GFR calc non Af Amer 47 (*)    GFR calc Af Amer 54 (*)    All other components within normal limits  PROTIME-INR - Abnormal; Notable for the following:    Prothrombin Time 18.8 (*)    All other components within normal limits  TROPONIN I - Abnormal; Notable for the following:    Troponin I 0.03 (*)    All other components within normal limits  TROPONIN I  APTT  MAGNESIUM  BRAIN NATRIURETIC PEPTIDE   ____________________________________________  EKG  ED ECG REPORT I, Kambrey Hagger, the attending physician, personally viewed and interpreted this ECG.  Date: 12/08/2016 EKG Time: 13:59 Rate: 126 Rhythm: Tachycardia, difficult to appreciate whether it is sinus tachycardia or 2-1 conduction of atrial flutter QRS Axis: normal Intervals: normal ST/T Wave abnormalities: Inverted T-wave in aVR and aVL.  Approximately 1 mm of ST elevation in lead V2.  Very slight ST depression and leads V5 and V6.  LVH. Conduction Disturbances: none Narrative Interpretation: questionable ischemia  ____________________________________________  RADIOLOGY   Dg Chest Port 1 View  Result Date: 12/08/2016 CLINICAL DATA:  Chest pain. EXAM: PORTABLE CHEST 1 VIEW COMPARISON:  11/27/2016.  01/15/2016.  03/11/2015. FINDINGS: Mediastinum and hilar structures normal. Stable cardiomegaly. Mild bilateral from interstitial prominence. Mild CHF cannot be excluded . No pleural effusion or pneumothorax . IMPRESSION: Stable cardiomegaly. Mild pulmonary interstitial edema cannot be excluded. Electronically Signed    By: Marcello Moores  Register   On: 12/08/2016 14:39    ____________________________________________   PROCEDURES  Procedure(s) performed:   .Critical Care Performed by: Hinda Kehr Authorized by: Hinda Kehr   Critical care provider statement:    Critical care time (minutes):  45   Critical care time was exclusive of:  Separately billable procedures and treating other patients   Critical care was necessary to treat or prevent imminent or life-threatening deterioration of the following conditions:  Cardiac failure and circulatory failure   Critical care was time spent personally by me on the following activities:  Development of treatment plan with patient or surrogate, discussions with consultants, evaluation of patient's response to treatment, examination of patient, obtaining history from patient or surrogate, ordering and performing treatments and interventions, ordering and review of laboratory studies, ordering and review of radiographic studies, pulse oximetry, re-evaluation of patient's condition and review of old charts     Critical Care performed: Yes, see critical care procedure note(s) ____________________________________________   INITIAL IMPRESSION / [place] / ED COURSE  Pertinent labs & imaging results that were available during my care of the patient were reviewed by me and considered in my medical decision making (see chart for details).  We will pursue standard workup for partial ACS but the patient also appears to be in a flutter with RVR.  I suspect that the EKG changes are rate related and the result of demand ischemia.  I will attempt to treat with metoprolol 5 mg IV while pursuing standard laboratory workup.  The patient is  in no distress right now.   Clinical Course as of Dec 08 1852  Mon Dec 08, 2016  1711 No improvement of tachycardia with metoprolol.  Negative troponin, but the patient started to feel a little bit more chest discomfort again.  I  will give 1 more dose of metoprolol to see if has an affect but then we may need to consider other medical treatment of what I believe to be a flutter with 2-1 conduction.  [CF]  [id number] I spoke by phone with Dr. Saralyn Pilar who is covering for Dr. Ubaldo Glassing.  I explained the situation.  He recommended that I try a bolus of diltiazem 15 mg IV and wait 15 minutes.  If that is not successful in controlling the rate I could try another bolus or start an IV drip of diltiazem.  The likely need inpatient admission if she continues to have uncontrolled tachycardia at that point.  We have just administered the first dose of diltiazem and she had no effect after about 5 minutes.  I will continue to monitor to determine the need for additional treatment.  [CF]  1836 BP dropped to about [id number] systolic after dilt, but no improvement in rate.  Rather than risking a precipitous drop in BP after a repeat bolus, I will start an infusion and discuss the case with the hospitalist.  Also ordering heparin infusion (no bolus) since patient is not adequately anticoagulated.  [CF]  1846 Contrary to my last note, the patient's blood pressure was actually back up to 155/110 so I did administer the second dose of diltiazem 15 mg IV.  I observed while the patient was getting the medication and she very briefly dipped down to about 1:15 that then came back up to 130-132.  I will start the diltiazem  [CF]    Clinical Course User Index [CF] Hinda Kehr, MD    ____________________________________________  FINAL CLINICAL IMPRESSION(S) / ED DIAGNOSES  Final diagnoses:  Atrial flutter with rapid ventricular response ([place])  Chest pain, unspecified type     MEDICATIONS GIVEN DURING THIS VISIT:  Medications  diltiazem (CARDIZEM) 100 mg in dextrose 5 % 100 mL (1 mg/mL) infusion (not administered)  sodium chloride 0.9 % bolus 500 mL (0 mLs Intravenous Stopped 12/08/16 1607)  metoprolol (LOPRESSOR) injection 5 mg (5 mg Intravenous Given  12/08/16 1527)  metoprolol (LOPRESSOR) injection 5 mg (5 mg Intravenous Given 12/08/16 1701)  diltiazem (CARDIZEM) injection 15 mg (15 mg Intravenous Given 12/08/16 1808)  diltiazem (CARDIZEM) injection 15 mg (15 mg Intravenous Given 12/08/16 1840)     NEW OUTPATIENT MEDICATIONS STARTED DURING THIS VISIT:  New Prescriptions   No medications on file    Modified Medications   No medications on file    Discontinued Medications   No medications on file     Note:  This document was prepared using Dragon voice recognition software and may include unintentional dictation errors.    Hinda Kehr, MD 12/08/16 [phone]

## 2016-12-08 NOTE — Progress Notes (Signed)
ANTICOAGULATION CONSULT NOTE - Initial Consult  Pharmacy Consult for Warfarin Indication: atrial fibrillation  Allergies  Allergen Reactions  . Alendronate Sodium     REACTION: questionable  . Amoxicillin-Pot Clavulanate   . Etodolac   . Ibandronate Sodium     REACTION: questionable  . Iodine     Other reaction(s): Unknown  . Lactose Intolerance (Gi) Other (See Comments)    GI problems  . Minocycline Hcl   . Risedronate Sodium   . Sulfonamide Derivatives Other (See Comments)    Stomach pain  . Zoledronic Acid     REACTION: "dental problems"    Patient Measurements: Height: 4\' 8"  (142.2 cm) Weight: 111 lb (50.3 kg) IBW/kg (Calculated) : 36.3 Heparin Dosing Weight: 46.9 kg  Vital Signs: Temp: 98 F (36.7 C) (12/18 1407) Temp Source: Oral (12/18 1407) BP: 125/81 (12/18 1930) Pulse Rate: 141 (12/18 1930)  Labs:  Recent Labs  12/08/16 1437 12/08/16 1807  HGB 12.0  --   HCT 35.4  --   PLT 371  --   APTT 35  --   LABPROT 18.8*  --   INR 1.56  --   CREATININE 1.05*  --   TROPONINI <0.03 0.03*    Estimated Creatinine Clearance: 25.4 mL/min (by C-G formula based on SCr of 1.05 mg/dL (H)).   Medical History: Past Medical History:  Diagnosis Date  . Allergy   . Anginal pain ([place])    c/o angina on May 04, 2016 releived by Nitroglycerin X 2  . Anxiety   . Arthritis   . Atrial fibrillation ([place])   . Atrial fibrillation ([place])   . Atrial fibrillation with rapid ventricular response [facility]) November 2015   Heart rate up to 140 requiring IV Cardizem  . Breast cancer [facility]) 2012   Patient underwent a left mastectomy on 03-31-11. Pathology showed DCIS at both sites without an invasive component. Sentinel nodes were negative. The breast lesions were noted to be ER +40%, PR-positive, 5%.   . Breast screening, unspecified   . Coronary artery disease   . Diverticulosis   . Dysrhythmia   . Essential hypertension, benign   . GERD (gastroesophageal reflux disease)   .  Hearing disorder 2012   hearing aids  . Heart attack 2001  . Heart disease 2001  . Hip pain   . Hyperlipidemia   . Hypertension 1996  . Lump or mass in breast   . Malignant neoplasm of upper-inner quadrant of female breast ([place])   . Malignant neoplasm of upper-outer quadrant of female breast ([place])   . Other benign neoplasm of connective and other soft tissue of thorax   . Personal history of malignant neoplasm of breast   . Reflux    acid reflux  . Shortness of breath dyspnea    with exertion  . Special screening for malignant neoplasms, colon   . Stroke [facility]) 2009  . Unspecified essential hypertension     Medications:  Scheduled:  . [START ON 12/09/2016] pantoprazole  40 mg Oral BID AC  . warfarin  4 mg Oral Once  . [START ON 12/09/2016] Warfarin - Pharmacist Dosing Inpatient   Does not apply q1800   Infusions:  . heparin      Assessment: 82 yo Female with Afibrillation, subtherapeutic on Warfarin. Currently ordered Heparin drip. Patient on Warfarin 3 mg PTA per Med Rec.   12/18  INR 1.56   Goal of Therapy:  Monitor platelets by anticoagulation protocol: Yes   Plan:  Will  order Warfarin 4 mg x 1 dose for tonight 12/18. Will f/u INR w/ am labs.   Zaia Carre A 12/08/2016,8:01 PM

## 2016-12-08 NOTE — Progress Notes (Signed)
ANTICOAGULATION CONSULT NOTE - Initial Consult  Pharmacy Consult for Heparin drip Indication: atrial fibrillation  Allergies  Allergen Reactions  . Alendronate Sodium     REACTION: questionable  . Amoxicillin-Pot Clavulanate   . Etodolac   . Ibandronate Sodium     REACTION: questionable  . Iodine     Other reaction(s): Unknown  . Lactose Intolerance (Gi) Other (See Comments)    GI problems  . Minocycline Hcl   . Risedronate Sodium   . Sulfonamide Derivatives Other (See Comments)    Stomach pain  . Zoledronic Acid     REACTION: "dental problems"    Patient Measurements: Height: 4\' 8"  (142.2 cm) Weight: 111 lb (50.3 kg) IBW/kg (Calculated) : 36.3 Heparin Dosing Weight: 46.9 kg  Vital Signs: Temp: 98 F (36.7 C) (12/18 1407) Temp Source: Oral (12/18 1407) BP: 150/117 (12/18 1830) Pulse Rate: 135 (12/18 1830)  Labs:  Recent Labs  12/08/16 1437 12/08/16 1807  HGB 12.0  --   HCT 35.4  --   PLT 371  --   APTT 35  --   LABPROT 18.8*  --   INR 1.56  --   CREATININE 1.05*  --   TROPONINI <0.03 0.03*    Estimated Creatinine Clearance: 25.4 mL/min (by C-G formula based on SCr of 1.05 mg/dL (H)).   Medical History: Past Medical History:  Diagnosis Date  . Allergy   . Anginal pain ([place])    c/o angina on May 04, 2016 releived by Nitroglycerin X 2  . Anxiety   . Arthritis   . Atrial fibrillation ([place])   . Atrial fibrillation ([place])   . Atrial fibrillation with rapid ventricular response [facility]) November 2015   Heart rate up to 140 requiring IV Cardizem  . Breast cancer [facility]) 2012   Patient underwent a left mastectomy on 03-31-11. Pathology showed DCIS at both sites without an invasive component. Sentinel nodes were negative. The breast lesions were noted to be ER +40%, PR-positive, 5%.   . Breast screening, unspecified   . Coronary artery disease   . Diverticulosis   . Dysrhythmia   . Essential hypertension, benign   . GERD (gastroesophageal reflux disease)   .  Hearing disorder 2012   hearing aids  . Heart attack 2001  . Heart disease 2001  . Hip pain   . Hyperlipidemia   . Hypertension 1996  . Lump or mass in breast   . Malignant neoplasm of upper-inner quadrant of female breast ([place])   . Malignant neoplasm of upper-outer quadrant of female breast ([place])   . Other benign neoplasm of connective and other soft tissue of thorax   . Personal history of malignant neoplasm of breast   . Reflux    acid reflux  . Shortness of breath dyspnea    with exertion  . Special screening for malignant neoplasms, colon   . Stroke [facility]) 2009  . Unspecified essential hypertension     Medications:  Scheduled:   Infusions:  . diltiazem (CARDIZEM) infusion    . heparin      Assessment: 82 yo Female with Afibrillation, subtherapeutic on Warfarin.  Per MD- initiate drip with no bolus. Patient on Warfarin 3 mg PTA per Med Rec.  Baseline labs:  Hgb 12.0  Plt 371  aptt 35  INR 1.56  Goal of Therapy:  Heparin level 0.3-0.7 units/ml Monitor platelets by anticoagulation protocol: Yes   Plan:  Begin Heparin drip at 700 units/hr- no bolus. Will check Heparin level in  8 hrs. F/u CBC daily.   Jameelah Watts A 12/08/2016,7:06 PM

## 2016-12-09 ENCOUNTER — Inpatient Hospital Stay
Admit: 2016-12-09 | Discharge: 2016-12-09 | Disposition: A | Discharge status: Home / Self Care | Payer: Medicare Other | Attending: Internal Medicine | Admitting: Internal Medicine

## 2016-12-09 LAB — BASIC METABOLIC PANEL
Anion gap: 5 (ref 5–15)
BUN: 14 mg/dL (ref 6–20)
CALCIUM: 8.6 mg/dL — AB (ref 8.9–10.3)
CHLORIDE: 109 mmol/L (ref 101–111)
CO2: 25 mmol/L (ref 22–32)
CREATININE: 0.82 mg/dL (ref 0.44–1.00)
GFR calc Af Amer: >60 mL/min (ref >60)
GFR calc non Af Amer: >60 mL/min (ref >60)
Glucose, Bld: 99 mg/dL (ref 65–99)
Potassium: 3.8 mmol/L (ref 3.5–5.1)
SODIUM: 139 mmol/L (ref 135–145)

## 2016-12-09 LAB — PROTIME-INR
INR: 1.58
PROTHROMBIN TIME: 19 s — AB (ref 11.4–15.2)

## 2016-12-09 LAB — CBC
HCT: 34.5 % — ABNORMAL LOW (ref 35.0–47.0)
HEMOGLOBIN: 11.6 g/dL — AB (ref 12.0–16.0)
MCH: 32.2 pg (ref 26.0–34.0)
MCHC: 33.5 g/dL (ref 32.0–36.0)
MCV: 96.3 fL (ref 80.0–100.0)
Platelets: 353 10*3/uL (ref 150–440)
RBC: 3.59 MIL/uL — AB (ref 3.80–5.20)
RDW: 16.3 % — ABNORMAL HIGH (ref 11.5–14.5)
WBC: 8 10*3/uL (ref 3.6–11.0)

## 2016-12-09 LAB — TROPONIN I
TROPONIN I: 0.04 ng/mL — AB (ref <0.03)
Troponin I: 0.03 ng/mL (ref <0.03)

## 2016-12-09 LAB — ECHOCARDIOGRAM COMPLETE
Height: 56 in
Weight: 1689.6 oz

## 2016-12-09 LAB — HEPARIN LEVEL (UNFRACTIONATED)
HEPARIN UNFRACTIONATED: 0.36 [IU]/mL (ref 0.30–0.70)
HEPARIN UNFRACTIONATED: 0.42 [IU]/mL (ref 0.30–0.70)

## 2016-12-09 MED ORDER — POLYETHYLENE GLYCOL 3350 17 G PO PACK
17.0000 g | PACK | Freq: Every day | ORAL | Status: DC
  Administered 2016-12-09 – 2016-12-11 (×3): 17 g via ORAL
  Filled 2016-12-09 (×3): qty 1

## 2016-12-09 MED ORDER — DILTIAZEM HCL ER COATED BEADS 240 MG PO CP24
240.0000 mg | ORAL_CAPSULE | Freq: Every day | ORAL | Status: DC
  Administered 2016-12-09 – 2016-12-11 (×3): 240 mg via ORAL
  Filled 2016-12-09 (×3): qty 1

## 2016-12-09 MED ORDER — SUCRALFATE 1 GM/10ML PO SUSP
1.0000 g | Freq: Four times a day (QID) | ORAL | Status: DC
  Administered 2016-12-09 – 2016-12-11 (×6): 1 g via ORAL
  Filled 2016-12-09 (×6): qty 10

## 2016-12-09 MED ORDER — WARFARIN SODIUM 1 MG PO TABS
4.0000 mg | ORAL_TABLET | Freq: Every day | ORAL | Status: DC
  Administered 2016-12-09 – 2016-12-10 (×2): 4 mg via ORAL
  Filled 2016-12-09 (×2): qty 4

## 2016-12-09 NOTE — Progress Notes (Signed)
*  PRELIMINARY RESULTS* Echocardiogram 2D Echocardiogram has been performed.  Peggy [name] 12/09/2016, 9:05 AM

## 2016-12-09 NOTE — Progress Notes (Signed)
[facility] NAME: Peggy [name]    MR#:  [id number]  DATE OF BIRTH:  07/14/1928  SUBJECTIVE:  CHIEF COMPLAINT:   Chief Complaint  Patient presents with  . Abdominal Pain  . Chest Pain   Poor historian.  No abd pain, sob or palpitations  REVIEW OF SYSTEMS:    Review of Systems  Constitutional: Positive for malaise/fatigue. Negative for chills and fever.  HENT: Negative for sore throat.   Eyes: Negative for blurred vision, double vision and pain.  Respiratory: Negative for cough, hemoptysis, shortness of breath and wheezing.   Cardiovascular: Positive for palpitations. Negative for chest pain, orthopnea and leg swelling.  Gastrointestinal: Positive for abdominal pain and constipation. Negative for diarrhea, heartburn, nausea and vomiting.  Genitourinary: Negative for dysuria and hematuria.  Musculoskeletal: Negative for back pain and joint pain.  Skin: Negative for rash.  Neurological: Negative for sensory change, speech change, focal weakness and headaches.  Endo/Heme/Allergies: Does not bruise/bleed easily.  Psychiatric/Behavioral: Negative for depression. The patient is not nervous/anxious.     DRUG ALLERGIES:   Allergies  Allergen Reactions  . Alendronate Sodium     REACTION: questionable  . Amoxicillin-Pot Clavulanate   . Etodolac   . Ibandronate Sodium     REACTION: questionable  . Iodine     Other reaction(s): Unknown  . Lactose Intolerance (Gi) Other (See Comments)    GI problems  . Minocycline Hcl   . Risedronate Sodium   . Sulfonamide Derivatives Other (See Comments)    Stomach pain  . Zoledronic Acid     REACTION: "dental problems"    VITALS:  Blood pressure 137/69, pulse 68, temperature 97.9 F (36.6 C), temperature source Oral, resp. rate 18, height 4\' 8"  (1.422 m), weight 47.9 kg (105 lb 9.6 oz), SpO2 94 %.  PHYSICAL EXAMINATION:   Physical Exam  GENERAL:  82 y.o.-year-old patient lying in  the bed with no acute distress.  EYES: Pupils equal, round, reactive to light and accommodation. No scleral icterus. Extraocular muscles intact.  HEENT: Head atraumatic, normocephalic. Oropharynx and nasopharynx clear.  NECK:  Supple, no jugular venous distention. No thyroid enlargement, no tenderness.  LUNGS: Normal breath sounds bilaterally, no wheezing, rales, rhonchi. No use of accessory muscles of respiration.  CARDIOVASCULAR: S1, S2 normal. No murmurs, rubs, or gallops.  ABDOMEN: Soft, nondistended. Bowel sounds present. No organomegaly or mass. Mild lower abd tenderness. EXTREMITIES: No cyanosis, clubbing or edema b/l.    NEUROLOGIC: Cranial nerves II through XII are intact. No focal Motor or sensory deficits b/l.   PSYCHIATRIC: The patient is alert and awake SKIN: No obvious rash, lesion, or ulcer.   LABORATORY PANEL:   CBC  Recent Labs Lab 12/09/16 0428  WBC 8.0  HGB 11.6*  HCT 34.5*  PLT 353   ------------------------------------------------------------------------------------------------------------------ Chemistries   Recent Labs Lab 12/08/16 1437 12/09/16 0428  NA 138 139  K 3.8 3.8  CL 106 109  CO2 23 25  GLUCOSE 157* 99  BUN 19 14  CREATININE 1.05* 0.82  CALCIUM 8.5* 8.6*  MG 2.0  --   AST 39  --   ALT 84*  --   ALKPHOS 143*  --   BILITOT 0.4  --    ------------------------------------------------------------------------------------------------------------------  Cardiac Enzymes  Recent Labs Lab 12/09/16 1211  TROPONINI 0.03*   ------------------------------------------------------------------------------------------------------------------  RADIOLOGY:  Dg Chest Port 1 View  Result Date: 12/08/2016 CLINICAL DATA:  Chest pain. EXAM: PORTABLE CHEST 1 VIEW  COMPARISON:  11/27/2016.  01/15/2016.  03/11/2015. FINDINGS: Mediastinum and hilar structures normal. Stable cardiomegaly. Mild bilateral from interstitial prominence. Mild CHF cannot be  excluded . No pleural effusion or pneumothorax . IMPRESSION: Stable cardiomegaly. Mild pulmonary interstitial edema cannot be excluded. Electronically Signed   By: Marcello Moores  Register   On: 12/08/2016 14:39     ASSESSMENT AND PLAN:   Peggy [name]  is a 82 y.o. female with a known history of CAD, history of atrial fibrillation on Coumadin, hypertension, GERD presents to hospital secondary to chest pain, heartburn and constant belching. Her heart rate was noted to be in the 150s, atrial flutter and did not respond to 3 doses of IV Cardizem. The patient is being admitted for further heart rate controlled.  #1 Atrial fibrillation-with rapid ventricular response. -Started on Cardizem drip and now stopped -Continue metoprolol. Echo - Nothing acute Restarted PO cardizem from home. Stop heparin drip and continue coumadin PO. Increase dose  #2 Dysphagia Barium swallow in AM per GI  Mild on and off abd pain likely from constipation  #3 degenerative disc disease with severe back pain-follows with physiatrist as outpatient for injections in the back. -Continue pain medication as needed  #4 CAD-stable. Continue home medications  #5 Depression-continue Remeron and Lexapro.  #6 DVT prophylaxis-onCoumadin. Pharmacy to adjust the dose.  All the records are reviewed and case discussed with Care Management/Social Workerr. Management plans discussed with the patient, family and they are in agreement.  CODE STATUS: FULL CODE  DVT Prophylaxis: SCDs  TOTAL TIME TAKING CARE OF THIS PATIENT: 30 minutes.   POSSIBLE D/C IN 1-2 DAYS, DEPENDING ON CLINICAL CONDITION.  Peggy [name] R M.D on 12/09/2016 at 2:49 PM  Between 7am to 6pm - Pager - [phone]  After 6pm go to [email or web address] - password [email or web address] [facility]  Office  [phone]  CC: Primary care physician; [place] Pink, MD  Note: This dictation was prepared with Dragon dictation along with smaller phrase  technology. Any transcriptional errors that result from this process are unintentional.

## 2016-12-09 NOTE — Consult Note (Signed)
GI Inpatient Consult Note  Reason for Consult: abd pain    Attending Requesting Consult: Dr. Darvin Neighbours   History of Present Illness: Peggy [name] is a 82 y.o. female seen for evaluation of abdominal pain at the request of Dr. Darvin Neighbours. PMHX of atrial fib, GERD, CHF admitted for abd pain. Also seen in ED 12/05/16 for abd pain.     She reports approximately 2 weeks ago developing dysphagia, this is most prominent when she takes pills particularly large pills or multiple pills at a time.  She denies any liquid or solid dysphagia.  Feels pills become lodged and will cause pain in mid chest area.  Pain seems to be improved by belching.  Denies any regurgitation of pills.  Chest pain better if walks around. No nausea or vomiting.  Also has right upper and lower quadrant pain which has been ongoing approximately 2 months.  She is unable to state if pain is better worse with eating.  Unable to say if relieved with defecation.  Usually moving stools approximately daily but take stool softeners on a as needed basis.  Does report significant straining and painful defecation due to hard stools w/ incomplete evacuation. She denies any rectal bleeding.  Occasionally has dark stools. She denies NSAID use or soda intake.  No diet changes and above symptoms began. Also has right lower back pain and is planning for back injections later this week.   Daughter also contacted at patient's request to discuss symptoms.  Per daughter's report her appetite has been better over the past 2 months.  She has been on Remeron for poor appetite approximately 6 months ago but has regained about 8 pounds in the past few weeks and is now eating well.   Last Colonoscopy: 2006-diverticulosis in sigmoid, descending, transverse and ascending. Random bx done-normal.   Last Endoscopy: 2011-tortuous middle and lower 1/3 esophagus, diffuse inflammation gastric body, deformity 2nd portion of duodenum. Small HH. Path w/ mild chronic inactive  gastritis.    Past Medical History:  Past Medical History:  Diagnosis Date  . Allergy   . Anginal pain ([place])    c/o angina on May 04, 2016 releived by Nitroglycerin X 2  . Anxiety   . Arthritis   . Atrial fibrillation ([place])   . Atrial fibrillation ([place])   . Atrial fibrillation with rapid ventricular response [facility]) November 2015   Heart rate up to 140 requiring IV Cardizem  . Breast cancer [facility]) 2012   Patient underwent a left mastectomy on 03-31-11. Pathology showed DCIS at both sites without an invasive component. Sentinel nodes were negative. The breast lesions were noted to be ER +40%, PR-positive, 5%.   . Breast screening, unspecified   . Coronary artery disease   . Diverticulosis   . Dysrhythmia   . Essential hypertension, benign   . GERD (gastroesophageal reflux disease)   . Hearing disorder 2012   hearing aids  . Heart attack 2001  . Heart disease 2001  . Hip pain   . Hyperlipidemia   . Hypertension 1996  . Lump or mass in breast   . Malignant neoplasm of upper-inner quadrant of female breast ([place])   . Malignant neoplasm of upper-outer quadrant of female breast ([place])   . Other benign neoplasm of connective and other soft tissue of thorax   . Personal history of malignant neoplasm of breast   . Reflux    acid reflux  . Shortness of breath dyspnea    with exertion  .  Special screening for malignant neoplasms, colon   . Stroke [facility]) 2009  . Unspecified essential hypertension     Problem List: Patient Active Problem List   Diagnosis Date Noted  . Atrial flutter ([place]) 12/08/2016  . S/P total hip arthroplasty 02/27/2016  . Hip pain 01/15/2016  . Memory loss 03/21/2015  . Abnormality of gait 03/21/2015  . Atrial fibrillation with RVR ([place]) 03/11/2015  . H/O: CVA (cerebrovascular accident) 03/11/2015  . Right temporal lobe infarction ([place]) 04/10/2014  . Blurred vision 09/05/2013  . Neoplasm of left breast, primary tumor staging category Tis: ductal carcinoma in  situ (DCIS) 03/09/2011  . EPIGASTRIC PAIN 02/14/2009  . NONSPECIFIC ABN FINDNG RAD&OTH EXAM BILARY TRCT 02/14/2009    Past Surgical History: Past Surgical History:  Procedure Laterality Date  . ABDOMINAL HYSTERECTOMY     age 53  . APPENDECTOMY     age 4  . BREAST BIOPSY     left breast biopsy over 15 years ago   . CHOLECYSTECTOMY     40 years ago   . COLON SURGERY     Dr. Tamala Julian, [facility]  . COLONOSCOPY  2009   [facility] Dr. Donnella Sham  . EXCISION OF BACK LESION Left 05/09/2016   Procedure: EXCISION OF GLUTEAL MASS;  Surgeon: Robert Bellow, MD;  Location: [facility] ORS;  Service: General;  Laterality: Left;  . EYE SURGERY Bilateral    Cataract Extraction with IOL  . HERNIA REPAIR    . JOINT REPLACEMENT Right    TOTAL HIP REPLACEMENT  . MASTECTOMY  2012   left breast  . SMALL INTESTINE SURGERY  12/31/2012   Abdominal exploration, lysis of adhesions for distal small bowel obstruction. Rochel Brome, MD  . TONSILLECTOMY     as a child  . TOTAL HIP ARTHROPLASTY Right 02/27/2016   Procedure: TOTAL HIP ARTHROPLASTY;  Surgeon: Dereck Leep, MD;  Location: [facility] ORS;  Service: Orthopedics;  Laterality: Right;  . UPPER GI ENDOSCOPY  2009   [facility] Dr. Donnella Sham    Allergies: Allergies  Allergen Reactions  . Alendronate Sodium     REACTION: questionable  . Amoxicillin-Pot Clavulanate   . Etodolac   . Ibandronate Sodium     REACTION: questionable  . Iodine     Other reaction(s): Unknown  . Lactose Intolerance (Gi) Other (See Comments)    GI problems  . Minocycline Hcl   . Risedronate Sodium   . Sulfonamide Derivatives Other (See Comments)    Stomach pain  . Zoledronic Acid     REACTION: "dental problems"    Home Medications: Prescriptions Prior to Admission  Medication Sig Dispense Refill Last Dose  . acetaminophen (TYLENOL) 500 MG tablet Take 1,000 mg by mouth every 6 (six) hours as needed for mild pain.    12/07/2016 at Unknown time  . ALPRAZolam (XANAX) 0.25 MG tablet Take 0.25 mg by  mouth at bedtime as needed for anxiety or sleep.    12/07/2016 at Unknown time  . aspirin EC 81 MG tablet Take 81 mg by mouth daily.   12/08/2016 at Unknown time  . Calcium Carbonate-Vitamin D (RA CALCIUM PLUS VITAMIN D) 600-400 MG-UNIT tablet Take 1 tablet by mouth 2 (two) times daily.   12/07/2016 at Unknown time  . diltiazem (DILACOR XR) 180 MG 24 hr capsule Take 180 mg by mouth daily.   12/08/2016 at Unknown time  . docusate sodium (COLACE) 100 MG capsule Take 100 mg by mouth daily as needed for mild constipation.   Past  Week at Unknown time  . escitalopram (LEXAPRO) 10 MG tablet Take 10 mg by mouth at bedtime.    12/07/2016 at Unknown time  . fexofenadine (ALLEGRA) 180 MG tablet Take 180 mg by mouth as needed for allergies or rhinitis.   12/07/2016 at Unknown time  . Menthol, Topical Analgesic, (BENGAY VANISHING SCENT) 2.5 % GEL Apply topically as needed (for pain).    12/08/2016 at Unknown time  . metoprolol tartrate (LOPRESSOR) 25 MG tablet Take 25 mg by mouth 2 (two) times daily.    12/08/2016 at Unknown time  . mirtazapine (REMERON) 15 MG tablet Take 15 mg by mouth at bedtime.   12/07/2016 at Unknown time  . Multiple Vitamins-Minerals (CENTRUM SILVER PO) Take 1 tablet by mouth daily.   Past Week at Unknown time  . Multiple Vitamins-Minerals (PRESERVISION AREDS 2 PO) Take 1 tablet by mouth 2 (two) times daily.   12/08/2016 at Unknown time  . nitroGLYCERIN (NITROSTAT) 0.4 MG SL tablet Place 0.4 mg under the tongue every 5 (five) minutes as needed for chest pain.    12/08/2016 at Unknown time  . oxyCODONE-acetaminophen (ROXICET) 5-325 MG tablet Take 1 tablet by mouth every 6 (six) hours as needed. 20 tablet 0 12/08/2016 at 0800  . pantoprazole (PROTONIX) 40 MG tablet Take 40 mg by mouth daily.   12/08/2016 at Unknown time  . Probiotic Product (PROBIOTIC DAILY PO) Take 1 tablet by mouth daily.    12/08/2016 at Unknown time  . vitamin B-12 (CYANOCOBALAMIN) 1000 MCG tablet Take 1,000 mcg by mouth  at bedtime.    12/07/2016 at Unknown time  . warfarin (COUMADIN) 1 MG tablet 3 mg daily.    12/07/2016 at Unknown time  . traMADol (ULTRAM) 50 MG tablet Take 1-2 tablets (50-100 mg total) by mouth every 4 (four) hours as needed for moderate pain. 30 tablet 0 prn at prn   Home medication reconciliation was completed with the patient.   Scheduled Inpatient Medications:   . acidophilus  1 capsule Oral Daily  . aspirin EC  81 mg Oral Daily  . calcium-vitamin D  1 tablet Oral BID  . diltiazem  240 mg Oral Daily  . escitalopram  10 mg Oral QHS  . loratadine  10 mg Oral Daily  . metoprolol tartrate  25 mg Oral BID  . mirtazapine  15 mg Oral QHS  . multivitamin-lutein  1 capsule Oral BID  . pantoprazole  40 mg Oral BID AC  . sodium chloride flush  3 mL Intravenous Q12H  . vitamin B-12  1,000 mcg Oral QHS  . warfarin  3 mg Oral q1800  . Warfarin - Pharmacist Dosing Inpatient   Does not apply q1800    Continuous Inpatient Infusions:   . diltiazem (CARDIZEM) infusion 10 mg/hr (12/09/16 0513)  . heparin      PRN Inpatient Medications:  acetaminophen, ALPRAZolam, alum & mag hydroxide-simeth, docusate sodium, nitroGLYCERIN, ondansetron **OR** ondansetron (ZOFRAN) IV, oxyCODONE-acetaminophen  Family History: family history includes Breast cancer in her sister; Heart disease in her father and mother; Lung cancer in her brother; Stroke in her father.   Social History:   reports that she has never smoked. She has never used smokeless tobacco. She reports that she does not drink alcohol or use drugs.    Review of Systems: Constitutional: Weight is stable.  Eyes: No changes in vision. ENT: No oral lesions, sore throat.  GI: see HPI.  Heme/Lymph: No easy bruising.  CV: No chest pain.  GU:  No hematuria.  Integumentary: No rashes.  Neuro: No headaches.  Psych: No depression/anxiety.  Endocrine: No heat/cold intolerance.  Allergic/Immunologic: No urticaria.  Resp: No cough, SOB.   Musculoskeletal: No joint swelling.    Physical Examination: BP 140/72 (BP Location: Right Arm)   Pulse 77   Temp 98.1 F (36.7 C) (Oral)   Resp 18   Ht 4\' 8"  (1.422 m)   Wt 47.9 kg (105 lb 9.6 oz)   SpO2 99%   BMI 23.68 kg/m  Gen: NAD, alert and oriented x 4 HEENT: PEERLA, EOMI, Neck: supple, no JVD or thyromegaly Chest: CTA bilaterally, no wheezes, crackles, or other adventitious sounds CV: RRR, no m/g/c/r Abd: soft, TTP in RUQ and RLQ area, ND, +BS in all four quadrants; no HSM, guarding, ridigity, or rebound tenderness Ext: no edema, well perfused with 2+ pulses, Skin: no rash or lesions noted Lymph: no LAD  Data: Lab Results  Component Value Date   WBC 8.0 12/09/2016   HGB 11.6 (L) 12/09/2016   HCT 34.5 (L) 12/09/2016   MCV 96.3 12/09/2016   PLT 353 12/09/2016    Recent Labs Lab 12/05/16 1233 12/08/16 1437 12/09/16 0428  HGB 13.1 12.0 11.6*   Lab Results  Component Value Date   NA 139 12/09/2016   K 3.8 12/09/2016   CL 109 12/09/2016   CO2 25 12/09/2016   BUN 14 12/09/2016   CREATININE 0.82 12/09/2016   Lab Results  Component Value Date   ALT 84 (H) 12/08/2016   AST 39 12/08/2016   ALKPHOS 143 (H) 12/08/2016   BILITOT 0.4 12/08/2016    Recent Labs Lab 12/08/16 1437 12/09/16 0428  APTT 35  --   INR 1.56 1.58    CT a/p 12/05/2016 IMPRESSION: 1. Chronic and mildly progressive intrahepatic and extrahepatic biliary duct dilatation compared back to CT of 12/29/2012 favor benign duct dilatation. 2. Small lesion in the body the pancreas is increased in size over a 3 year interval. Favor benign / indolent lesion. A contrast abdominal MRI may provide additional information. 3. Small peripancreatic lymph node is similar comparison exam. 4. No acute findings in the abdomen pelvis. 5. Sigmoid diverticulosis without evidence diverticulitis. 6. Compression fracture in the lower thoracic spine most severe T11. Fractured age-indeterminate though favor  chronic.   Assessment/Plan: Ms. Brandell is a 82 y.o. female admitted for abdominal pain, dysphagia.   1. Dysphagia - likely has presbyesophagus given prior EGD findings of tortuous esophagus, will get barium swallow to exclude concurrent ring, stricture, web. On coumadin chronically and has heparin drip inpatient - would need to coordinate possible EGD w/ cardiology if evidence of stricture.   2. Abd pain - worse in RLQ, has underlying constipation, needs to start daily bowel regimen. Will start daily miralax. CT overall unremarkable for etiology of pain.   3. Bloating/belching - switched from lansoprazole to protonix 40mg  qd last week, continue pantoprazole. Will also add carafate given she is post CCY. She avoids sodas.  4. Abnormal LFT - ALT increased to 84. No new meds/OTC supplements. Will check LFTS tomorrow.   Recommendations: 1. Barium swallow 2. Miralax 17gram PO qd 3. Carate QID 4. Repeat LFTS tomorrow.   *case discussed w/ Dr. Gustavo Lah. HPI information discussed w/ daughter, daughter Loletha Carrow also called w/ update of treatment plan   Thank you for the consult. Please call with questions or concerns.  Laurine Blazer, PA-C [place]

## 2016-12-09 NOTE — Progress Notes (Signed)
Pt alert and oriented x4, no complaints of pain or discomfort.  Bed in low position, call bell within reach.  Bed alarms on and functioning.  Assessment done and charted.  Will continue to monitor and do hourly rounding throughout the shift 

## 2016-12-09 NOTE — Progress Notes (Signed)
ANTICOAGULATION CONSULT NOTE - Initial Consult  Pharmacy Consult for Warfarin Indication: atrial fibrillation  Allergies  Allergen Reactions  . Alendronate Sodium     REACTION: questionable  . Amoxicillin-Pot Clavulanate   . Etodolac   . Ibandronate Sodium     REACTION: questionable  . Iodine     Other reaction(s): Unknown  . Lactose Intolerance (Gi) Other (See Comments)    GI problems  . Minocycline Hcl   . Risedronate Sodium   . Sulfonamide Derivatives Other (See Comments)    Stomach pain  . Zoledronic Acid     REACTION: "dental problems"    Patient Measurements: Height: 4\' 8"  (142.2 cm) Weight: 105 lb 9.6 oz (47.9 kg) IBW/kg (Calculated) : 36.3 Heparin Dosing Weight: 46.9 kg  Vital Signs: Temp: 97.9 F (36.6 C) (12/19 1110) Temp Source: Oral (12/19 1110) BP: 137/69 (12/19 1110) Pulse Rate: 68 (12/19 1110)  Labs:  Recent Labs  12/08/16 1437  12/08/16 2242 12/09/16 0428 12/09/16 1211  HGB 12.0  --   --  11.6*  --   HCT 35.4  --   --  34.5*  --   PLT 371  --   --  353  --   APTT 35  --   --   --   --   LABPROT 18.8*  --   --  19.0*  --   INR 1.56  --   --  1.58  --   HEPARINUNFRC  --   --   --  0.36 0.42  CREATININE 1.05*  --   --  0.82  --   TROPONINI <0.03  < > 0.04* 0.04* 0.03*  < > = values in this interval not displayed.  Estimated Creatinine Clearance: 31.8 mL/min (by C-G formula based on SCr of 0.82 mg/dL).   Medical History: Past Medical History:  Diagnosis Date  . Allergy   . Anginal pain ([place])    c/o angina on May 04, 2016 releived by Nitroglycerin X 2  . Anxiety   . Arthritis   . Atrial fibrillation ([place])   . Atrial fibrillation ([place])   . Atrial fibrillation with rapid ventricular response [facility]) November 2015   Heart rate up to 140 requiring IV Cardizem  . Breast cancer [facility]) 2012   Patient underwent a left mastectomy on 03-31-11. Pathology showed DCIS at both sites without an invasive component. Sentinel nodes were negative. The  breast lesions were noted to be ER +40%, PR-positive, 5%.   . Breast screening, unspecified   . Coronary artery disease   . Diverticulosis   . Dysrhythmia   . Essential hypertension, benign   . GERD (gastroesophageal reflux disease)   . Hearing disorder 2012   hearing aids  . Heart attack 2001  . Heart disease 2001  . Hip pain   . Hyperlipidemia   . Hypertension 1996  . Lump or mass in breast   . Malignant neoplasm of upper-inner quadrant of female breast ([place])   . Malignant neoplasm of upper-outer quadrant of female breast ([place])   . Other benign neoplasm of connective and other soft tissue of thorax   . Personal history of malignant neoplasm of breast   . Reflux    acid reflux  . Shortness of breath dyspnea    with exertion  . Special screening for malignant neoplasms, colon   . Stroke [facility]) 2009  . Unspecified essential hypertension     Medications:  Scheduled:  . acidophilus  1 capsule Oral Daily  .  aspirin EC  81 mg Oral Daily  . calcium-vitamin D  1 tablet Oral BID  . diltiazem  240 mg Oral Daily  . escitalopram  10 mg Oral QHS  . loratadine  10 mg Oral Daily  . metoprolol tartrate  25 mg Oral BID  . mirtazapine  15 mg Oral QHS  . multivitamin-lutein  1 capsule Oral BID  . pantoprazole  40 mg Oral BID AC  . polyethylene glycol  17 g Oral Daily  . sodium chloride flush  3 mL Intravenous Q12H  . sucralfate  1 g Oral Q6H  . vitamin B-12  1,000 mcg Oral QHS  . warfarin  4 mg Oral q1800  . Warfarin - Pharmacist Dosing Inpatient   Does not apply q1800   Infusions:    Assessment: 82 yo Female with Afibrillation, subtherapeutic on Warfarin. Patient on Warfarin 3 mg PTA per Med Rec.  Goal of Therapy:  Goal INR 2-3   Plan:  Heparin drip stopped. Will continue warfarin 4 mg daily and f/u AM INR.   Ulice Dash D 12/09/2016,3:51 PM

## 2016-12-09 NOTE — Progress Notes (Signed)
Pt made aware of having to be NPO after midnight tonight for the esophag.  Testing.  Daughter also made aware of the plans for tomorrow.  Pt verbalizes understanding and agree with the PA.  Will continue to monitor the pt throughout the shift

## 2016-12-09 NOTE — Progress Notes (Signed)
ANTICOAGULATION CONSULT NOTE - Initial Consult  Pharmacy Consult for Heparin drip Indication: atrial fibrillation  Allergies  Allergen Reactions  . Alendronate Sodium     REACTION: questionable  . Amoxicillin-Pot Clavulanate   . Etodolac   . Ibandronate Sodium     REACTION: questionable  . Iodine     Other reaction(s): Unknown  . Lactose Intolerance (Gi) Other (See Comments)    GI problems  . Minocycline Hcl   . Risedronate Sodium   . Sulfonamide Derivatives Other (See Comments)    Stomach pain  . Zoledronic Acid     REACTION: "dental problems"    Patient Measurements: Height: 4\' 8"  (142.2 cm) Weight: 105 lb 9.6 oz (47.9 kg) IBW/kg (Calculated) : 36.3 Heparin Dosing Weight: 46.9 kg  Vital Signs: Temp: 98.5 F (36.9 C) (12/19 0416) Temp Source: Oral (12/19 0416) BP: 118/68 (12/19 0416) Pulse Rate: 69 (12/19 0416)  Labs:  Recent Labs  12/08/16 1437 12/08/16 1807 12/08/16 2242 12/09/16 0428  HGB 12.0  --   --  11.6*  HCT 35.4  --   --  34.5*  PLT 371  --   --  353  APTT 35  --   --   --   LABPROT 18.8*  --   --  19.0*  INR 1.56  --   --  1.58  HEPARINUNFRC  --   --   --  0.36  CREATININE 1.05*  --   --  0.82  TROPONINI <0.03 0.03* 0.04*  --     Estimated Creatinine Clearance: 31.8 mL/min (by C-G formula based on SCr of 0.82 mg/dL).   Medical History: Past Medical History:  Diagnosis Date  . Allergy   . Anginal pain ([place])    c/o angina on May 04, 2016 releived by Nitroglycerin X 2  . Anxiety   . Arthritis   . Atrial fibrillation ([place])   . Atrial fibrillation ([place])   . Atrial fibrillation with rapid ventricular response [facility]) November 2015   Heart rate up to 140 requiring IV Cardizem  . Breast cancer [facility]) 2012   Patient underwent a left mastectomy on 03-31-11. Pathology showed DCIS at both sites without an invasive component. Sentinel nodes were negative. The breast lesions were noted to be ER +40%, PR-positive, 5%.   . Breast screening, unspecified    . Coronary artery disease   . Diverticulosis   . Dysrhythmia   . Essential hypertension, benign   . GERD (gastroesophageal reflux disease)   . Hearing disorder 2012   hearing aids  . Heart attack 2001  . Heart disease 2001  . Hip pain   . Hyperlipidemia   . Hypertension 1996  . Lump or mass in breast   . Malignant neoplasm of upper-inner quadrant of female breast ([place])   . Malignant neoplasm of upper-outer quadrant of female breast ([place])   . Other benign neoplasm of connective and other soft tissue of thorax   . Personal history of malignant neoplasm of breast   . Reflux    acid reflux  . Shortness of breath dyspnea    with exertion  . Special screening for malignant neoplasms, colon   . Stroke [facility]) 2009  . Unspecified essential hypertension     Medications:  Scheduled:  . acidophilus  1 capsule Oral Daily  . aspirin EC  81 mg Oral Daily  . calcium-vitamin D  1 tablet Oral BID  . escitalopram  10 mg Oral QHS  . loratadine  10 mg  Oral Daily  . metoprolol tartrate  25 mg Oral BID  . mirtazapine  15 mg Oral QHS  . multivitamin-lutein  1 capsule Oral BID  . pantoprazole  40 mg Oral BID AC  . sodium chloride flush  3 mL Intravenous Q12H  . vitamin B-12  1,000 mcg Oral QHS  . warfarin  3 mg Oral q1800  . Warfarin - Pharmacist Dosing Inpatient   Does not apply q1800   Infusions:  . diltiazem (CARDIZEM) infusion 10 mg/hr (12/08/16 2312)  . heparin      Assessment: 82 yo Female with Afibrillation, subtherapeutic on Warfarin.  Per MD- initiate drip with no bolus. Patient on Warfarin 3 mg PTA per Med Rec.  Baseline labs:  Hgb 12.0  Plt 371  aptt 35  INR 1.56  Goal of Therapy:  Heparin level 0.3-0.7 units/ml Monitor platelets by anticoagulation protocol: Yes   Plan:  Begin Heparin drip at 700 units/hr- no bolus. Will check Heparin level in 8 hrs. F/u CBC daily.  12/19 0428 HL therapeutic x 1. Continue current rate. Will recheck HL in 8 hours.  Laural [name],  Pharm.D., BCPS Clinical Pharmacist 12/09/2016,5:06 AM

## 2016-12-09 NOTE — Consult Note (Signed)
Patient seen and examined chart reviewed. Please see full GI consult by Ms. Heide Spark. Patient admitted with chest pain and atrial fibrillation. She has also been having some right-sided abdominal pain at times associated with some constipation. Recent CT scan 12/05/2016 shows a small mid pancreatic body lesion does not seem to impinge on the main pancreatic duct. This is minimally increased in size over the past 3 years. She also has complaint of some pill dysphagia although not solid or liquid. There is also some belching. She has a recent history also of a compression fracture has given her some right sided radiculopathy. Is minimal elevation of BNP.  Per Ms. Woodward's note agree with barium swallow. This will be done tomorrow morning. Also would institute MiraLAX on a regular basis. Serial LFTs while in the hospital. Also would add Carafate 1 g 4 times a day. See the results of the barium swallow and make further GI disposition depending on that. Likely all this can be followed up as an outpatient.  We'll follow with you

## 2016-12-10 ENCOUNTER — Inpatient Hospital Stay: Payer: Medicare Other

## 2016-12-10 LAB — CBC
HEMATOCRIT: 38.3 % (ref 35.0–47.0)
HEMOGLOBIN: 12.8 g/dL (ref 12.0–16.0)
MCH: 32 pg (ref 26.0–34.0)
MCHC: 33.3 g/dL (ref 32.0–36.0)
MCV: 96.1 fL (ref 80.0–100.0)
Platelets: 351 10*3/uL (ref 150–440)
RBC: 3.99 MIL/uL (ref 3.80–5.20)
RDW: 16.4 % — ABNORMAL HIGH (ref 11.5–14.5)
WBC: 9.6 10*3/uL (ref 3.6–11.0)

## 2016-12-10 LAB — PROTIME-INR
INR: 1.71
Prothrombin Time: 20.3 seconds — ABNORMAL HIGH (ref 11.4–15.2)

## 2016-12-10 LAB — HEPATIC FUNCTION PANEL
ALBUMIN: 3.2 g/dL — AB (ref 3.5–5.0)
ALK PHOS: 131 U/L — AB (ref 38–126)
ALT: 54 U/L (ref 14–54)
AST: 22 U/L (ref 15–41)
BILIRUBIN TOTAL: 0.8 mg/dL (ref 0.3–1.2)
Bilirubin, Direct: <0.1 mg/dL — ABNORMAL LOW (ref 0.1–0.5)
TOTAL PROTEIN: 6.5 g/dL (ref 6.5–8.1)

## 2016-12-10 MED ORDER — IRBESARTAN 75 MG PO TABS
37.5000 mg | ORAL_TABLET | Freq: Every day | ORAL | Status: DC
  Administered 2016-12-10 – 2016-12-11 (×2): 37.5 mg via ORAL
  Filled 2016-12-10 (×2): qty 1

## 2016-12-10 NOTE — Progress Notes (Signed)
Patient complained of right abdominal pain X1, relieved with percocet. Administered medications crushed in applesauce no indigestion/burping reported or observed.

## 2016-12-10 NOTE — Care Management (Signed)
Patient presents from home with abdominal and chest pain.  found to be in rapid atrial fib that did not respond to IV cardizem in the ED and required continuous infusion which has since been discontinued.  She is having a barium swallow this day.  She lives at home and independent in her adls. No issues accessing medical care.  On chronic coumadin

## 2016-12-10 NOTE — Progress Notes (Signed)
[place] [facility] [place] NAME: Peggy [name]    MR#:  [id number]  DATE OF BIRTH:  Jun 16, 1928  SUBJECTIVE:  CHIEF COMPLAINT:   Chief Complaint  Patient presents with  . Abdominal Pain  . Chest Pain   -Admitted with A. fib RVR and also increased belching and right upper quadrant abdominal pain. -Heart rate is controlled. Off the Cardizem drip and on oral medications now. - barium swallow done this morning.  REVIEW OF SYSTEMS:  Review of Systems  Constitutional: Negative for chills, fever and malaise/fatigue.  HENT: Positive for hearing loss. Negative for ear discharge, ear pain and nosebleeds.   Eyes: Negative for blurred vision and double vision.  Respiratory: Negative for cough, shortness of breath and wheezing.   Cardiovascular: Negative for chest pain, palpitations and leg swelling.  Gastrointestinal: Positive for abdominal pain, constipation and heartburn. Negative for diarrhea, nausea and vomiting.  Genitourinary: Negative for dysuria.  Musculoskeletal: Negative for myalgias.  Neurological: Negative for dizziness, sensory change, speech change, focal weakness, seizures and headaches.  Psychiatric/Behavioral: Negative for depression.    DRUG ALLERGIES:   Allergies  Allergen Reactions  . Alendronate Sodium     REACTION: questionable  . Amoxicillin-Pot Clavulanate   . Etodolac   . Ibandronate Sodium     REACTION: questionable  . Iodine     Other reaction(s): Unknown  . Lactose Intolerance (Gi) Other (See Comments)    GI problems  . Minocycline Hcl   . Risedronate Sodium   . Sulfonamide Derivatives Other (See Comments)    Stomach pain  . Zoledronic Acid     REACTION: "dental problems"    VITALS:  Blood pressure (!) 161/76, pulse 70, temperature 98.5 F (36.9 C), temperature source Oral, resp. rate 18, height 4\' 8"  (1.422 m), weight 47.9 kg (105 lb 9.6 oz), SpO2 94 %.  PHYSICAL EXAMINATION:  Physical Exam  GENERAL:   82 y.o.-year-old elderly patient lying in the bed with no acute distress.  EYES: Pupils equal, round, reactive to light and accommodation. No scleral icterus. Extraocular muscles intact.  HEENT: Head atraumatic, normocephalic. Oropharynx and nasopharynx clear.  NECK:  Supple, no jugular venous distention. No thyroid enlargement, no tenderness.  LUNGS: Normal breath sounds bilaterally, no wheezing, rales,rhonchi or crepitation. No use of accessory muscles of respiration.  CARDIOVASCULAR: S1, S2  irregular. No murmurs, rubs, or gallops.  ABDOMEN: Soft, tender in the right upper quadrant with voluntary guarding,, nondistended. Bowel sounds present. No organomegaly or mass.  EXTREMITIES: No pedal edema, cyanosis, or clubbing.  NEUROLOGIC: Cranial nerves II through XII are intact. Muscle strength 5/5 in all extremities. Sensation intact. Gait not checked.  PSYCHIATRIC: The patient is alert and oriented x 3.  SKIN: No obvious rash, lesion, or ulcer.    LABORATORY PANEL:   CBC  Recent Labs Lab 12/10/16 0741  WBC 9.6  HGB 12.8  HCT 38.3  PLT 351   ------------------------------------------------------------------------------------------------------------------  Chemistries   Recent Labs Lab 12/08/16 1437 12/09/16 0428  NA 138 139  K 3.8 3.8  CL 106 109  CO2 23 25  GLUCOSE 157* 99  BUN 19 14  CREATININE 1.05* 0.82  CALCIUM 8.5* 8.6*  MG 2.0  --   AST 39  --   ALT 84*  --   ALKPHOS 143*  --   BILITOT 0.4  --    ------------------------------------------------------------------------------------------------------------------  Cardiac Enzymes  Recent Labs Lab 12/09/16 1211  TROPONINI 0.03*   ------------------------------------------------------------------------------------------------------------------  RADIOLOGY:  Dg Esophagus  Result Date: 12/10/2016 CLINICAL DATA:  Chest pain, heartburn, and constant belching. EXAM: ESOPHOGRAM/BARIUM SWALLOW TECHNIQUE: Single  contrast examination was performed using thin barium or water soluble. A barium tablet was also administered. FLUOROSCOPY TIME:  Fluoroscopy Time:  0 minutes, 24 seconds Radiation Exposure Index (if provided by the fluoroscopic device): 380 micro Gy per meter square Number of Acquired Spot Images:  4 + 2 video loops COMPARISON:  Chest x-ray of December 08, 2016 FINDINGS: The study was performed in the semi recumbent position. The patient ingested the thin barium without difficulty. There was no laryngeal penetration of the barium. The thoracic esophagus distended well. There were intermittent prominent tertiary contractions. When the contractions were present the patient complained of chest discomfort. There was subjective narrowing of the distal 3 cm of the distal esophagus. This did distend reasonably well with the liquid barium. The barium tablet passed to this region and then promptly into the stomach without significant delay. There was no evidence of a hiatal hernia or gastroesophageal reflux. A small amount of gas was present within the stomach at the conclusion of the study. The patient felt the sensation to birth but could not. IMPRESSION: Moderate changes of presbyesophagus. No fixed stricture or evidence of esophagitis or diverticulum. No hiatal hernia or gastroesophageal reflux. No laryngeal penetration of the barium. Electronically Signed   By: David  Martinique M.D.   On: 12/10/2016 09:19   Dg Chest Port 1 View  Result Date: 12/08/2016 CLINICAL DATA:  Chest pain. EXAM: PORTABLE CHEST 1 VIEW COMPARISON:  11/27/2016.  01/15/2016.  03/11/2015. FINDINGS: Mediastinum and hilar structures normal. Stable cardiomegaly. Mild bilateral from interstitial prominence. Mild CHF cannot be excluded . No pleural effusion or pneumothorax . IMPRESSION: Stable cardiomegaly. Mild pulmonary interstitial edema cannot be excluded. Electronically Signed   By: Marcello Moores  Register   On: 12/08/2016 14:39    EKG:   Orders  placed or performed during the hospital encounter of 12/08/16  . EKG 12-Lead  . EKG 12-Lead    ASSESSMENT AND PLAN:   Peggy [name]  is a 82 y.o. female with a known history of CAD, history of atrial fibrillation on Coumadin, hypertension, GERD presents to hospital secondary to chest pain, heartburn and constant belching. Her heart rate was noted to be in the 150s, atrial flutter and did not respond to 3 doses of IV Cardizem. The patient is being admitted for further heart rate controlled.  #1 atrial fibrillation- improved HR. Off cardizem drip -Cardiology consulted. -And oral Cardizem, dose increased. Also on metoprolol orally. Echocardiogram done. -Monitor on telemetry -Takes warfarin for anticoagulation  #2 right upper quadrant abdominal pain with belching-being worked up as outpatient. But worsened in the last couple of weeks. -CT of the abdomen done 3 days ago showing chronic changes. Today from constipation. On MiraLAX daily -Appreciate GI consult. Barium swallow done did not show any strictures, has presbyesophagus -Continue Protonix, Carafate added.-She is status post cholecystectomy.  #3 degenerative disc disease with severe back pain-follows with physiatrist as outpatient for injections in the back and hip. -Continue pain medication as needed  #4 CAD-stable. Continue home medications  #5 depression-continue Remeron and Lexapro.  #6 DVT prophylaxis-on Coumadin. Pharmacy to adjust the dose.  Physical therapy consulted. Recommended home health     All the records are reviewed and case discussed with Care Management/Social Workerr. Management plans discussed with the patient, family and they are in agreement.  CODE STATUS: Full code  TOTAL TIME TAKING CARE OF THIS  PATIENT: 38 minutes.   POSSIBLE D/C IN 1-2 DAYS, DEPENDING ON CLINICAL CONDITION.   Arye Weyenberg M.D on 12/10/2016 at 12:12 PM  Between 7am to 6pm - Pager - [phone]  After 6pm go to  [email or web address] - password [email or web address] [facility]  Office  [phone]  CC: Primary care physician; [place] Pink, MD

## 2016-12-10 NOTE — Consult Note (Signed)
Subjective: Patient seen for right sided abdominal pain and dysphagia.  Patient doing well, minimal abdominal discomfort today.    Objective: Vital signs in last 24 hours: Temp:  [98 F (36.7 C)-98.5 F (36.9 C)] 98.3 F (36.8 C) (12/20 1236) Pulse Rate:  [70-78] 74 (12/20 1236) Resp:  [16-18] 16 (12/20 1236) BP: (156-178)/(72-79) 156/77 (12/20 1236) SpO2:  [94 %-97 %] 97 % (12/20 1236) Blood pressure (!) 156/77, pulse 74, temperature 98.3 F (36.8 C), temperature source Oral, resp. rate 16, height 4\' 8"  (1.422 m), weight 47.9 kg (105 lb 9.6 oz), SpO2 97 %.   Intake/Output from previous day: 12/19 0701 - 12/20 0700 In: 500.5 [P.O.:480; I.V.:20.5] Out: 850 [Urine:850]  Intake/Output this shift: Total I/O In: -  Out: 200 [Urine:200]   General appearance:  88 f no distress Resp:  bcta Cardio:  Irr GI:  Soft, mild discomfort to palpation mostly epigastric.  No masses or rebound. Bowel sounds positive.  Extremities:     Lab Results: Results for orders placed or performed during the hospital encounter of 12/08/16 (from the past 24 hour(s))  Protime-INR     Status: Abnormal   Collection Time: 12/10/16  7:41 AM  Result Value Ref Range   Prothrombin Time 20.3 (H) 11.4 - 15.2 seconds   INR 1.71   CBC     Status: Abnormal   Collection Time: 12/10/16  7:41 AM  Result Value Ref Range   WBC 9.6 3.6 - 11.0 K/uL   RBC 3.99 3.80 - 5.20 MIL/uL   Hemoglobin 12.8 12.0 - 16.0 g/dL   HCT 38.3 35.0 - 47.0 %   MCV 96.1 80.0 - 100.0 fL   MCH 32.0 26.0 - 34.0 pg   MCHC 33.3 32.0 - 36.0 g/dL   RDW 16.4 (H) 11.5 - 14.5 %   Platelets 351 150 - 440 K/uL  Hepatic function panel     Status: Abnormal   Collection Time: 12/10/16  7:41 AM  Result Value Ref Range   Total Protein 6.5 6.5 - 8.1 g/dL   Albumin 3.2 (L) 3.5 - 5.0 g/dL   AST 22 15 - 41 U/L   ALT 54 14 - 54 U/L   Alkaline Phosphatase 131 (H) 38 - 126 U/L   Total Bilirubin 0.8 0.3 - 1.2 mg/dL   Bilirubin, Direct <0.1 (L) 0.1 - 0.5  mg/dL   Indirect Bilirubin NOT CALCULATED 0.3 - 0.9 mg/dL      Recent Labs  12/08/16 1437 12/09/16 0428 12/10/16 0741  WBC 8.3 8.0 9.6  HGB 12.0 11.6* 12.8  HCT 35.4 34.5* 38.3  PLT 371 353 351   BMET  Recent Labs  12/08/16 1437 12/09/16 0428  NA 138 139  K 3.8 3.8  CL 106 109  CO2 23 25  GLUCOSE 157* 99  BUN 19 14  CREATININE 1.05* 0.82  CALCIUM 8.5* 8.6*   LFT  Recent Labs  12/10/16 0741  PROT 6.5  ALBUMIN 3.2*  AST 22  ALT 54  ALKPHOS 131*  BILITOT 0.8  BILIDIR <0.1*  IBILI NOT CALCULATED   PT/INR  Recent Labs  12/09/16 0428 12/10/16 0741  LABPROT 19.0* 20.3*  INR 1.58 1.71   Hepatitis Panel No results for input(s): HEPBSAG, HCVAB, HEPAIGM, HEPBIGM in the last 72 hours. C-Diff No results for input(s): CDIFFTOX in the last 72 hours. No results for input(s): CDIFFPCR in the last 72 hours.   Studies/Results: Dg Esophagus  Result Date: 12/10/2016 CLINICAL DATA:  Chest pain, heartburn, and  constant belching. EXAM: ESOPHOGRAM/BARIUM SWALLOW TECHNIQUE: Single contrast examination was performed using thin barium or water soluble. A barium tablet was also administered. FLUOROSCOPY TIME:  Fluoroscopy Time:  0 minutes, 24 seconds Radiation Exposure Index (if provided by the fluoroscopic device): 380 micro Gy per meter square Number of Acquired Spot Images:  4 + 2 video loops COMPARISON:  Chest x-ray of December 08, 2016 FINDINGS: The study was performed in the semi recumbent position. The patient ingested the thin barium without difficulty. There was no laryngeal penetration of the barium. The thoracic esophagus distended well. There were intermittent prominent tertiary contractions. When the contractions were present the patient complained of chest discomfort. There was subjective narrowing of the distal 3 cm of the distal esophagus. This did distend reasonably well with the liquid barium. The barium tablet passed to this region and then promptly into the  stomach without significant delay. There was no evidence of a hiatal hernia or gastroesophageal reflux. A small amount of gas was present within the stomach at the conclusion of the study. The patient felt the sensation to birth but could not. IMPRESSION: Moderate changes of presbyesophagus. No fixed stricture or evidence of esophagitis or diverticulum. No hiatal hernia or gastroesophageal reflux. No laryngeal penetration of the barium. Electronically Signed   By: David  Martinique M.D.   On: 12/10/2016 09:19   Dg Chest Port 1 View  Result Date: 12/08/2016 CLINICAL DATA:  Chest pain. EXAM: PORTABLE CHEST 1 VIEW COMPARISON:  11/27/2016.  01/15/2016.  03/11/2015. FINDINGS: Mediastinum and hilar structures normal. Stable cardiomegaly. Mild bilateral from interstitial prominence. Mild CHF cannot be excluded . No pleural effusion or pneumothorax . IMPRESSION: Stable cardiomegaly. Mild pulmonary interstitial edema cannot be excluded. Electronically Signed   By: [place]   On: 12/08/2016 14:39    Scheduled Inpatient Medications:   . acidophilus  1 capsule Oral Daily  . aspirin EC  81 mg Oral Daily  . calcium-vitamin D  1 tablet Oral BID  . diltiazem  240 mg Oral Daily  . escitalopram  10 mg Oral QHS  . loratadine  10 mg Oral Daily  . metoprolol tartrate  25 mg Oral BID  . mirtazapine  15 mg Oral QHS  . multivitamin-lutein  1 capsule Oral BID  . pantoprazole  40 mg Oral BID AC  . polyethylene glycol  17 g Oral Daily  . sodium chloride flush  3 mL Intravenous Q12H  . sucralfate  1 g Oral Q6H  . vitamin B-12  1,000 mcg Oral QHS  . warfarin  4 mg Oral q1800  . Warfarin - Pharmacist Dosing Inpatient   Does not apply q1800    Continuous Inpatient Infusions:    PRN Inpatient Medications:  acetaminophen, ALPRAZolam, alum & mag hydroxide-simeth, docusate sodium, nitroGLYCERIN, ondansetron **OR** ondansetron (ZOFRAN) IV, oxyCODONE-acetaminophen  Miscellaneous:   Assessment:  1) right  Sided  abdominal pain, uncertain etiology, of note there is a history of a small cystic lesion in the mid pancreas with minimal change over the past 3 years.  LFTs have almost normalized, etiology possibly reactive. Currently [place] pending, however [place] 2 weeks ago not informative. Will consider o/p MRCP after fu. 2) dysphagia- marked esophageal tortuosity and presbyesophagus without evidence of stricture or stenosis. I had a long discussion with patient and daughter in this regard with instructions for food preparation and swallowing habits. No plans for EGD.  Would change protonix to rabeprazole 20 mg daily.  3) chronic constipation-possible contributory to abdominal discomfort-recommend starting  miralax daily--if too much change to qod.  4) will need GI o/p followup in about 3 weeks.  Plan:  As noted above.   Lollie Sails MD 12/10/2016, 2:04 PM

## 2016-12-10 NOTE — Consult Note (Signed)
[name]  CARDIOLOGY CONSULT NOTE  Patient ID: Peggy [name] MRN: [id number] DOB/AGE: [birth year] 82 y.o.  Admit date: 12/08/2016 Referring Physician [facility]   Primary Physician  Primary Cardiologist Fath Reason for Consultation Atrial fibrillation  HPI: 82 year old female referred for atrial fibrillation. Patient has a history of CAD, paroxysmal atrial fibrillation on warfarin, hypertension, and GERD. Patient presented to the [facility] ER on 12/08/2016 with chest discomfort at rest that was relieved with belching. She was also found to be in atrial flutter with RVR at a rate around 150, and treated with 3 doses of IV cardizem, which she did not immediately respond to. She was able to transition to oral cardizem and is currently in sinus rhythm. Admission labs were notable for a troponin of 0.03 with a second troponin of 0.04. Echocardiogram revealed normal left ventricular function with an LVEF of 55-65%.  Patient had a similar episode of chest pain at rest on 11/27/2016 for which she went to [facility] ER, with a negative cardiac work-up. Patient currently denies chest pain or shortness of breath. She reports that she has walked down the hall multiple times today without chest pain. She denies experiencing palpitations or heart racing. She denies orthopnea or lower extremity edema. Lexiscan on 09/25/2015 was negative for ischemia with no arrhthymias or evidence for ischemia.   Review of systems complete and found to be negative unless listed above     Past Medical History:  Diagnosis Date  . Allergy   . Anginal pain ([place])    c/o angina on May 04, 2016 releived by Nitroglycerin X 2  . Anxiety   . Arthritis   . Atrial fibrillation ([place])   . Atrial fibrillation ([place])   . Atrial fibrillation with rapid ventricular response [facility]) November 2015   Heart rate up to 140 requiring IV Cardizem  . Breast cancer [facility]) 2012   Patient underwent a left mastectomy on 03-31-11. Pathology showed DCIS at both  sites without an invasive component. Sentinel nodes were negative. The breast lesions were noted to be ER +40%, PR-positive, 5%.   . Breast screening, unspecified   . Coronary artery disease   . Diverticulosis   . Dysrhythmia   . Essential hypertension, benign   . GERD (gastroesophageal reflux disease)   . Hearing disorder 2012   hearing aids  . Heart attack 2001  . Heart disease 2001  . Hip pain   . Hyperlipidemia   . Hypertension 1996  . Lump or mass in breast   . Malignant neoplasm of upper-inner quadrant of female breast ([place])   . Malignant neoplasm of upper-outer quadrant of female breast ([place])   . Other benign neoplasm of connective and other soft tissue of thorax   . Personal history of malignant neoplasm of breast   . Reflux    acid reflux  . Shortness of breath dyspnea    with exertion  . Special screening for malignant neoplasms, colon   . Stroke [facility]) 2009  . Unspecified essential hypertension     Past Surgical History:  Procedure Laterality Date  . ABDOMINAL HYSTERECTOMY     age 55  . APPENDECTOMY     age 78  . BREAST BIOPSY     left breast biopsy over 15 years ago   . CHOLECYSTECTOMY     40 years ago   . COLON SURGERY     Dr. Tamala Julian, [facility]  . COLONOSCOPY  2009   [facility] Dr. Donnella Sham  . EXCISION OF BACK LESION Left  05/09/2016   Procedure: EXCISION OF GLUTEAL MASS;  Surgeon: Robert Bellow, MD;  Location: [facility] ORS;  Service: General;  Laterality: Left;  . EYE SURGERY Bilateral    Cataract Extraction with IOL  . HERNIA REPAIR    . JOINT REPLACEMENT Right    TOTAL HIP REPLACEMENT  . MASTECTOMY  2012   left breast  . SMALL INTESTINE SURGERY  12/31/2012   Abdominal exploration, lysis of adhesions for distal small bowel obstruction. Rochel Brome, MD  . TONSILLECTOMY     as a child  . TOTAL HIP ARTHROPLASTY Right 02/27/2016   Procedure: TOTAL HIP ARTHROPLASTY;  Surgeon: Dereck Leep, MD;  Location: [facility] ORS;  Service: Orthopedics;  Laterality: Right;  . UPPER  GI ENDOSCOPY  2009   [facility] Dr. Donnella Sham    Prescriptions Prior to Admission  Medication Sig Dispense Refill Last Dose  . acetaminophen (TYLENOL) 500 MG tablet Take 1,000 mg by mouth every 6 (six) hours as needed for mild pain.    12/07/2016 at Unknown time  . ALPRAZolam (XANAX) 0.25 MG tablet Take 0.25 mg by mouth at bedtime as needed for anxiety or sleep.    12/07/2016 at Unknown time  . aspirin EC 81 MG tablet Take 81 mg by mouth daily.   12/08/2016 at Unknown time  . Calcium Carbonate-Vitamin D (RA CALCIUM PLUS VITAMIN D) 600-400 MG-UNIT tablet Take 1 tablet by mouth 2 (two) times daily.   12/07/2016 at Unknown time  . diltiazem (DILACOR XR) 180 MG 24 hr capsule Take 180 mg by mouth daily.   12/08/2016 at Unknown time  . docusate sodium (COLACE) 100 MG capsule Take 100 mg by mouth daily as needed for mild constipation.   Past Week at Unknown time  . escitalopram (LEXAPRO) 10 MG tablet Take 10 mg by mouth at bedtime.    12/07/2016 at Unknown time  . fexofenadine (ALLEGRA) 180 MG tablet Take 180 mg by mouth as needed for allergies or rhinitis.   12/07/2016 at Unknown time  . Menthol, Topical Analgesic, (BENGAY VANISHING SCENT) 2.5 % GEL Apply topically as needed (for pain).    12/08/2016 at Unknown time  . metoprolol tartrate (LOPRESSOR) 25 MG tablet Take 25 mg by mouth 2 (two) times daily.    12/08/2016 at Unknown time  . mirtazapine (REMERON) 15 MG tablet Take 15 mg by mouth at bedtime.   12/07/2016 at Unknown time  . Multiple Vitamins-Minerals (CENTRUM SILVER PO) Take 1 tablet by mouth daily.   Past Week at Unknown time  . Multiple Vitamins-Minerals (PRESERVISION AREDS 2 PO) Take 1 tablet by mouth 2 (two) times daily.   12/08/2016 at Unknown time  . nitroGLYCERIN (NITROSTAT) 0.4 MG SL tablet Place 0.4 mg under the tongue every 5 (five) minutes as needed for chest pain.    12/08/2016 at Unknown time  . oxyCODONE-acetaminophen (ROXICET) 5-325 MG tablet Take 1 tablet by mouth every 6 (six) hours as  needed. 20 tablet 0 12/08/2016 at 0800  . pantoprazole (PROTONIX) 40 MG tablet Take 40 mg by mouth daily.   12/08/2016 at Unknown time  . Probiotic Product (PROBIOTIC DAILY PO) Take 1 tablet by mouth daily.    12/08/2016 at Unknown time  . vitamin B-12 (CYANOCOBALAMIN) 1000 MCG tablet Take 1,000 mcg by mouth at bedtime.    12/07/2016 at Unknown time  . warfarin (COUMADIN) 1 MG tablet 3 mg daily.    12/07/2016 at Unknown time  . traMADol (ULTRAM) 50 MG tablet Take 1-2 tablets (50-100  mg total) by mouth every 4 (four) hours as needed for moderate pain. 30 tablet 0 prn at prn   Social History   Social History  . Marital status: Widowed    Spouse name: N/A  . Number of children: 2  . Years of education: 8   Occupational History  . Retired    Social History Main Topics  . Smoking status: Never Smoker  . Smokeless tobacco: Never Used  . Alcohol use No  . Drug use: No  . Sexual activity: Not on file   Other Topics Concern  . Not on file   Social History Narrative   Patient lives at home alone.    Patient has 2 children.    Patient has a 8th education.    Patient is widowed.    Patient is right handed.    Patient is retired.     Family History  Problem Relation Age of Onset  . Heart disease Mother   . Stroke Father   . Heart disease Father   . Breast cancer Sister     x 2, in their 27's  . Lung cancer Brother   . Colon cancer Neg Hx   . Ovarian cancer Neg Hx       Review of systems complete and found to be negative unless listed above      PHYSICAL EXAM  General: Well developed, well nourished, in no acute distress HEENT:  Normocephalic and atramatic Neck:  No JVD.  Lungs: Clear bilaterally to auscultation and percussion. Heart: HRRR . Normal S1 and S2 without gallops or murmurs.  Abdomen: Bowel sounds are positive, abdomen soft and non-tender  Msk:  Back normal, normal gait. Normal strength and tone for age. Extremities: No clubbing, cyanosis or edema.    Neuro: Alert and oriented X 3. Psych:  Good affect, responds appropriately  Labs:   Lab Results  Component Value Date   WBC 9.6 12/10/2016   HGB 12.8 12/10/2016   HCT 38.3 12/10/2016   MCV 96.1 12/10/2016   PLT 351 12/10/2016    Recent Labs Lab 12/09/16 0428 12/10/16 0741  NA 139  --   K 3.8  --   CL 109  --   CO2 25  --   BUN 14  --   CREATININE 0.82  --   CALCIUM 8.6*  --   PROT  --  6.5  BILITOT  --  0.8  ALKPHOS  --  131*  ALT  --  54  AST  --  22  GLUCOSE 99  --    Lab Results  Component Value Date   CKTOTAL 58 10/24/2014   CKMB 0.7 10/24/2014   TROPONINI 0.03 (HH) 12/09/2016    Lab Results  Component Value Date   CHOL 202 (H) 09/30/2014   CHOL 184 10/02/2012   Lab Results  Component Value Date   HDL 62 (H) 09/30/2014   HDL 58 10/02/2012   Lab Results  Component Value Date   LDLCALC 121 (H) 09/30/2014   LDLCALC 105 (H) 10/02/2012   Lab Results  Component Value Date   TRIG 96 09/30/2014   TRIG 105 10/02/2012   No results found for: CHOLHDL No results found for: LDLDIRECT    Radiology: Dg Chest 2 View  Result Date: 11/27/2016 CLINICAL DATA:  82 year old female with central chest pain acute onset this afternoon. Initial encounter. EXAM: CHEST  2 VIEW COMPARISON:  05/11/2016 and earlier. FINDINGS: PA and lateral views. Stable cardiomegaly and mediastinal  contours. Calcified aortic atherosclerosis. Visualized tracheal air column is within normal limits. No pneumothorax or pleural effusion. No consolidation or confluent pulmonary opacity. Chronic increased interstitial markings appear stable except for linear markings along the left lateral lung. Osteopenia. Multilevel probable chronic compression fractures at the thoracolumbar junction. Associated kyphosis. IMPRESSION: 1. Cardiomegaly. Difficult to exclude mild or developing pulmonary interstitial edema. 2. No other acute cardiopulmonary abnormality. Calcified aortic atherosclerosis. 3. Multilevel  compression fractures at the thoracolumbar junction with associated kyphosis, age indeterminate but favor chronic. Electronically Signed   By: Genevie Ann M.D.   On: 11/27/2016 18:21   Ct Abdomen Pelvis W Contrast  Result Date: 12/05/2016 CLINICAL DATA:  bilateral lower quadrant abdominal pain that has been going on for many weeks. Patient states the pain has gotten much worse today and is no longer bearable. EXAM: CT ABDOMEN AND PELVIS WITH CONTRAST TECHNIQUE: Multidetector CT imaging of the abdomen and pelvis was performed using the standard protocol following bolus administration of intravenous contrast. CONTRAST:  88mL ISOVUE-300 IOPAMIDOL (ISOVUE-300) INJECTION 61% COMPARISON:  None. FINDINGS: Lower chest: Lung bases are clear. Hepatobiliary: There is intrahepatic and extrahepatic duct dilatation. There is fusiform dilatation of the common hepatic duct and common bile duct. Common hepatic duct measures 13 mm while the common bile duct measures 11 mm. The duct is ectatic (image 38, series 5). This ductal dilatation is present on CT of 12/29/2012 and has progressed minimally with the extrahepatic ducts measuring approximately 1 to 2 mm larger. This duct dilatation extends to the ampulla. No obstructing lesion identified. Patient status post cholecystectomy. Pancreas: The pancreas is fatty replaced through the head. There is no pancreatic duct dilatation. There is a low-density lesion the mid body pancreas measuring 10 mm x 12 mm (image 24, series 20. This lesion is subtly seen on comparison exam measuring small the time at 9 mm by 8 mm (image 37, series 2) on CT of 12/29/2012. There is a lymph node along the undersurface of the superior mesenteric vein measuring 10 mm (image 32, series 2) compared to 10 mm on prior. Spleen: Spleen is relatively small Adrenals/urinary tract: Adrenal glands and kidneys are normal. Stomach/Bowel: Stomach, small-bowel by, and cecum normal. There multiple diverticular of the sigmoid  colon without acute inflammation. Vascular/Lymphatic: Abdominal aorta is normal caliber with atherosclerotic calcification. There is no retroperitoneal or periportal lymphadenopathy. No pelvic lymphadenopathy. Reproductive: Post hysterectomy anatomy.  No adnexal abnormality Other: No free-fluid in the abdomen pelvis. No inguinal or ventral hernia. Musculoskeletal: RIGHT hip prosthetic. No aggressive osseous findings. Compression fractures in the lower thoracic spine. Fracture at T11 with approximately 50% loss vertebral body height anteriorly. No retropulsion. There is gas within the disc space at these levels with suggests chronic fracture although technically age-indeterminate. IMPRESSION: 1. Chronic and mildly progressive intrahepatic and extrahepatic biliary duct dilatation compared back to CT of 12/29/2012 favor benign duct dilatation. 2. Small lesion in the body the pancreas is increased in size over a 3 year interval. Favor benign / indolent lesion. A contrast abdominal MRI may provide additional information. 3. Small peripancreatic lymph node is similar comparison exam. 4. No acute findings in the abdomen pelvis. 5. Sigmoid diverticulosis without evidence diverticulitis. 6. Compression fracture in the lower thoracic spine most severe T11. Fractured age-indeterminate though favor chronic. Electronically Signed   By: Suzy Bouchard M.D.   On: 12/05/2016 17:13   Dg Esophagus  Result Date: 12/10/2016 CLINICAL DATA:  Chest pain, heartburn, and constant belching. EXAM: ESOPHOGRAM/BARIUM SWALLOW TECHNIQUE: Single contrast  examination was performed using thin barium or water soluble. A barium tablet was also administered. FLUOROSCOPY TIME:  Fluoroscopy Time:  0 minutes, 24 seconds Radiation Exposure Index (if provided by the fluoroscopic device): 380 micro Gy per meter square Number of Acquired Spot Images:  4 + 2 video loops COMPARISON:  Chest x-ray of December 08, 2016 FINDINGS: The study was performed in  the semi recumbent position. The patient ingested the thin barium without difficulty. There was no laryngeal penetration of the barium. The thoracic esophagus distended well. There were intermittent prominent tertiary contractions. When the contractions were present the patient complained of chest discomfort. There was subjective narrowing of the distal 3 cm of the distal esophagus. This did distend reasonably well with the liquid barium. The barium tablet passed to this region and then promptly into the stomach without significant delay. There was no evidence of a hiatal hernia or gastroesophageal reflux. A small amount of gas was present within the stomach at the conclusion of the study. The patient felt the sensation to birth but could not. IMPRESSION: Moderate changes of presbyesophagus. No fixed stricture or evidence of esophagitis or diverticulum. No hiatal hernia or gastroesophageal reflux. No laryngeal penetration of the barium. Electronically Signed   By: David  Martinique M.D.   On: 12/10/2016 09:19   Dg Chest Port 1 View  Result Date: 12/08/2016 CLINICAL DATA:  Chest pain. EXAM: PORTABLE CHEST 1 VIEW COMPARISON:  11/27/2016.  01/15/2016.  03/11/2015. FINDINGS: Mediastinum and hilar structures normal. Stable cardiomegaly. Mild bilateral from interstitial prominence. Mild CHF cannot be excluded . No pleural effusion or pneumothorax . IMPRESSION: Stable cardiomegaly. Mild pulmonary interstitial edema cannot be excluded. Electronically Signed   By: Marcello Moores  Register   On: 12/08/2016 14:39   US Abdomen Limited Ruq  Result Date: 11/27/2016 CLINICAL DATA:  82 y/o F; 2-3 weeks of abdominal pain with history of cholecystectomy. EXAM: US ABDOMEN LIMITED - RIGHT UPPER QUADRANT COMPARISON:  12/29/2012 CT abdomen and pelvis. FINDINGS: Gallbladder: Status post cholecystectomy. Negative sonographic Murphy's sign. No fluid collection within the gallbladder bed. Common bile duct: Diameter: 11 mm. Liver: No focal  lesion identified. Within normal limits in parenchymal echogenicity. Intrahepatic mild biliary ductal dilatation. IMPRESSION: Mild intrahepatic biliary ductal dilatation and dilated common bile duct up to 11 mm stable from prior CT. This may be due to downstream obstruction or possibly compensatory in setting of cholecystectomy. Electronically Signed   By: Kristine Garbe M.D.   On: 11/27/2016 18:48    EKG: Sinus rhythm, rate 79 bpm  ASSESSMENT AND PLAN:  1. Chest discomfort, likely not cardiac in nature, as it resolves with belching and troponin borderline. Patient currently without chest pain or shortness of breath. 2. Atrial fibrillation, currently in sinus rhythm, rate controlled.  Recommendations: 1. Agree with current therapy. 2. Continue warfarin for stroke prevention  3. Continue oral cardizem and metoprolol tartrate. 4. No further cardiac diagnostics. 5. Follow-up with Dr. Ubaldo Glassing outpatient.  Sign off for now; call if any questions.  Signed: Clabe Seal, PA-C 12/10/2016, 5:04 PM

## 2016-12-10 NOTE — Progress Notes (Signed)
ANTICOAGULATION CONSULT NOTE - Initial Consult  Pharmacy Consult for Warfarin Indication: atrial fibrillation  Allergies  Allergen Reactions  . Alendronate Sodium     REACTION: questionable  . Amoxicillin-Pot Clavulanate   . Etodolac   . Ibandronate Sodium     REACTION: questionable  . Iodine     Other reaction(s): Unknown  . Lactose Intolerance (Gi) Other (See Comments)    GI problems  . Minocycline Hcl   . Risedronate Sodium   . Sulfonamide Derivatives Other (See Comments)    Stomach pain  . Zoledronic Acid     REACTION: "dental problems"    Patient Measurements: Height: 4\' 8"  (142.2 cm) Weight: 105 lb 9.6 oz (47.9 kg) IBW/kg (Calculated) : 36.3 Heparin Dosing Weight: 46.9 kg  Vital Signs: Temp: 98.3 F (36.8 C) (12/20 1236) Temp Source: Oral (12/20 1236) BP: 156/77 (12/20 1236) Pulse Rate: 74 (12/20 1236)  Labs:  Recent Labs  12/08/16 1437  12/08/16 2242 12/09/16 0428 12/09/16 1211 12/10/16 0741  HGB 12.0  --   --  11.6*  --  12.8  HCT 35.4  --   --  34.5*  --  38.3  PLT 371  --   --  353  --  351  APTT 35  --   --   --   --   --   LABPROT 18.8*  --   --  19.0*  --  20.3*  INR 1.56  --   --  1.58  --  1.71  HEPARINUNFRC  --   --   --  0.36 0.42  --   CREATININE 1.05*  --   --  0.82  --   --   TROPONINI <0.03  < > 0.04* 0.04* 0.03*  --   < > = values in this interval not displayed.  Estimated Creatinine Clearance: 31.8 mL/min (by C-G formula based on SCr of 0.82 mg/dL).   Medical History: Past Medical History:  Diagnosis Date  . Allergy   . Anginal pain ([place])    c/o angina on May 04, 2016 releived by Nitroglycerin X 2  . Anxiety   . Arthritis   . Atrial fibrillation ([place])   . Atrial fibrillation ([place])   . Atrial fibrillation with rapid ventricular response [facility]) November 2015   Heart rate up to 140 requiring IV Cardizem  . Breast cancer [facility]) 2012   Patient underwent a left mastectomy on 03-31-11. Pathology showed DCIS at both sites without an  invasive component. Sentinel nodes were negative. The breast lesions were noted to be ER +40%, PR-positive, 5%.   . Breast screening, unspecified   . Coronary artery disease   . Diverticulosis   . Dysrhythmia   . Essential hypertension, benign   . GERD (gastroesophageal reflux disease)   . Hearing disorder 2012   hearing aids  . Heart attack 2001  . Heart disease 2001  . Hip pain   . Hyperlipidemia   . Hypertension 1996  . Lump or mass in breast   . Malignant neoplasm of upper-inner quadrant of female breast ([place])   . Malignant neoplasm of upper-outer quadrant of female breast ([place])   . Other benign neoplasm of connective and other soft tissue of thorax   . Personal history of malignant neoplasm of breast   . Reflux    acid reflux  . Shortness of breath dyspnea    with exertion  . Special screening for malignant neoplasms, colon   . Stroke [facility]) 2009  .  Unspecified essential hypertension     Medications:  Scheduled:  . acidophilus  1 capsule Oral Daily  . aspirin EC  81 mg Oral Daily  . calcium-vitamin D  1 tablet Oral BID  . diltiazem  240 mg Oral Daily  . escitalopram  10 mg Oral QHS  . loratadine  10 mg Oral Daily  . metoprolol tartrate  25 mg Oral BID  . mirtazapine  15 mg Oral QHS  . multivitamin-lutein  1 capsule Oral BID  . pantoprazole  40 mg Oral BID AC  . polyethylene glycol  17 g Oral Daily  . sodium chloride flush  3 mL Intravenous Q12H  . sucralfate  1 g Oral Q6H  . vitamin B-12  1,000 mcg Oral QHS  . warfarin  4 mg Oral q1800  . Warfarin - Pharmacist Dosing Inpatient   Does not apply q1800   Infusions:    Assessment: 82 yo Female with Afibrillation, subtherapeutic on Warfarin. Patient on Warfarin 3 mg PTA per Med Rec.  Goal of Therapy:  Goal INR 2-3   Plan:  Will continue warfarin 4 mg daily and f/u AM INR.   Ulice Dash D 12/10/2016,1:05 PM

## 2016-12-11 LAB — PROTIME-INR
INR: 2.16
PROTHROMBIN TIME: 24.4 s — AB (ref 11.4–15.2)

## 2016-12-11 MED ORDER — IRBESARTAN 75 MG PO TABS: 75.0000 mg | ORAL_TABLET | Freq: Every day | ORAL | 2 refills | Status: DC

## 2016-12-11 MED ORDER — WARFARIN SODIUM 1 MG PO TABS: 3.0000 mg | ORAL_TABLET | Freq: Once | ORAL | Status: DC

## 2016-12-11 MED ORDER — DILTIAZEM HCL ER COATED BEADS 240 MG PO CP24: 240.0000 mg | ORAL_CAPSULE | Freq: Every day | ORAL | 2 refills | Status: DC

## 2016-12-11 MED ORDER — POLYETHYLENE GLYCOL 3350 17 G PO PACK: 17.0000 g | PACK | ORAL | 0 refills | Status: DC

## 2016-12-11 MED ORDER — RABEPRAZOLE SODIUM 20 MG PO TBEC: 20.0000 mg | DELAYED_RELEASE_TABLET | Freq: Two times a day (BID) | ORAL | 2 refills | Status: DC

## 2016-12-11 MED ORDER — WARFARIN SODIUM 4 MG PO TABS: 4.0000 mg | ORAL_TABLET | Freq: Every day | ORAL | 2 refills | Status: DC

## 2016-12-11 MED ORDER — SUCRALFATE 1 GM/10ML PO SUSP: 1.0000 g | Freq: Four times a day (QID) | ORAL | 0 refills | Status: DC

## 2016-12-11 NOTE — Progress Notes (Signed)
IVs and tele removed from patient. Discharge instructions given to patient and daughter who is present at bedside. Verbalized understanding with no further questions. Informed patient and daughter of prescriptions sent to pharmacy. Patient in no distress at this time. Daughter will be transporting patient home.

## 2016-12-11 NOTE — Care Management (Signed)
Informed there are no needs for this patient who is discharging today

## 2016-12-11 NOTE — Discharge Summary (Signed)
[place] [facility] [place] NAME: Peggy [name]    MR#:  [id number]  DATE OF BIRTH:  Oct 29, 1928  DATE OF ADMISSION:  12/08/2016   ADMITTING PHYSICIAN: Peggy Lighter, MD  DATE OF DISCHARGE: 12/11/2016 11:45 AM  PRIMARY CARE PHYSICIAN: Peggy Pink, MD   ADMISSION DIAGNOSIS:   Atrial flutter with rapid ventricular response ([place]) [I48.92] Chest pain, unspecified type [R07.9]  DISCHARGE DIAGNOSIS:   Active Problems:   Atrial flutter ([place])   SECONDARY DIAGNOSIS:   Past Medical History:  Diagnosis Date  . Allergy   . Anginal pain ([place])    c/o angina on May 04, 2016 releived by Nitroglycerin X 2  . Anxiety   . Arthritis   . Atrial fibrillation ([place])   . Atrial fibrillation ([place])   . Atrial fibrillation with rapid ventricular response [facility]) November 2015   Heart rate up to 140 requiring IV Cardizem  . Breast cancer [facility]) 2012   Patient underwent a left mastectomy on 03-31-11. Pathology showed DCIS at both sites without an invasive component. Sentinel nodes were negative. The breast lesions were noted to be ER +40%, PR-positive, 5%.   . Breast screening, unspecified   . Coronary artery disease   . Diverticulosis   . Dysrhythmia   . Essential hypertension, benign   . GERD (gastroesophageal reflux disease)   . Hearing disorder 2012   hearing aids  . Heart attack 2001  . Heart disease 2001  . Hip pain   . Hyperlipidemia   . Hypertension 1996  . Lump or mass in breast   . Malignant neoplasm of upper-inner quadrant of female breast ([place])   . Malignant neoplasm of upper-outer quadrant of female breast ([place])   . Other benign neoplasm of connective and other soft tissue of thorax   . Personal history of malignant neoplasm of breast   . Reflux    acid reflux  . Shortness of breath dyspnea    with exertion  . Special screening for malignant neoplasms, colon   . Stroke [facility]) 2009  . Unspecified essential hypertension      HOSPITAL COURSE:   Peggy Somersis a 82 y.o. femalewith a known history of CAD, history of atrial fibrillation on Coumadin, hypertension, GERD presents to hospital secondary to chest pain, heartburn and constant belching. Her heart rate was noted to be in the 150s, atrial flutter and did not respond to 3 doses of IV Cardizem. The patient is being admitted for further heart rate controlled.  #1 atrial fibrillation- improved HR. Off cardizem drip -Cardiology consulted. -And oral Cardizem- dose increased. Also on metoprolol orally. Echocardiogram done. -Takes warfarin for anticoagulation- dose adjusted. INR therapeutic  #2 right upper quadrant abdominal pain with belching-being worked up as outpatient. But worsened in the last couple of weeks. -CT of the abdomen done 3 days ago showing chronic changes. mostly from constipation. On MiraLAX every other day -Appreciate GI consult. Barium swallow done did not show any strictures, has presbyesophagus -Continue Protonix, Carafate added.-She is status post cholecystectomy. -Outpatient follow-up with GI recommended.  #3 degenerative disc disease with severe back pain-follows with physiatrist as outpatient for injections in the back and hip. -Continue pain medication as needed  #4 CAD-stable. Continue home medications  #5 depression-continue Remeron and Lexapro.   Physical therapy consulted. Recommended home health  DISCHARGE CONDITIONS:   Guarded  CONSULTS OBTAINED:   Treatment Team:  Isaias Cowman, MD  DRUG ALLERGIES:   Allergies  Allergen Reactions  .  Alendronate Sodium     REACTION: questionable  . Amoxicillin-Pot Clavulanate   . Etodolac   . Ibandronate Sodium     REACTION: questionable  . Iodine     Other reaction(s): Unknown  . Lactose Intolerance (Gi) Other (See Comments)    GI problems  . Minocycline Hcl   . Risedronate Sodium   . Sulfonamide Derivatives Other (See Comments)    Stomach pain  .  Zoledronic Acid     REACTION: "dental problems"   DISCHARGE MEDICATIONS:   Allergies as of 12/11/2016      Reactions   Alendronate Sodium    REACTION: questionable   Amoxicillin-pot Clavulanate    Etodolac    Ibandronate Sodium    REACTION: questionable   Iodine    Other reaction(s): Unknown   Lactose Intolerance (gi) Other (See Comments)   GI problems   Minocycline Hcl    Risedronate Sodium    Sulfonamide Derivatives Other (See Comments)   Stomach pain   Zoledronic Acid    REACTION: "dental problems"      Medication List    STOP taking these medications   diltiazem 180 MG 24 hr capsule Commonly known as:  DILACOR XR   pantoprazole 40 MG tablet Commonly known as:  PROTONIX     TAKE these medications   acetaminophen 500 MG tablet Commonly known as:  TYLENOL Take 1,000 mg by mouth every 6 (six) hours as needed for mild pain.   ALPRAZolam 0.25 MG tablet Commonly known as:  XANAX Take 0.25 mg by mouth at bedtime as needed for anxiety or sleep.   aspirin EC 81 MG tablet Take 81 mg by mouth daily.   BENGAY VANISHING SCENT 2.5 % Gel Generic drug:  Menthol (Topical Analgesic) Apply topically as needed (for pain).   diltiazem 240 MG 24 hr capsule Commonly known as:  CARDIZEM CD Take 1 capsule (240 mg total) by mouth daily. Start taking on:  12/12/2016   docusate sodium 100 MG capsule Commonly known as:  COLACE Take 100 mg by mouth daily as needed for mild constipation.   escitalopram 10 MG tablet Commonly known as:  LEXAPRO Take 10 mg by mouth at bedtime.   fexofenadine 180 MG tablet Commonly known as:  ALLEGRA Take 180 mg by mouth as needed for allergies or rhinitis.   irbesartan 75 MG tablet Commonly known as:  AVAPRO Take 1 tablet (75 mg total) by mouth daily. Start taking on:  12/12/2016   metoprolol tartrate 25 MG tablet Commonly known as:  LOPRESSOR Take 25 mg by mouth 2 (two) times daily.   mirtazapine 15 MG tablet Commonly known as:   REMERON Take 15 mg by mouth at bedtime.   nitroGLYCERIN 0.4 MG SL tablet Commonly known as:  NITROSTAT Place 0.4 mg under the tongue every 5 (five) minutes as needed for chest pain.   oxyCODONE-acetaminophen 5-325 MG tablet Commonly known as:  ROXICET Take 1 tablet by mouth every 6 (six) hours as needed.   polyethylene glycol packet Commonly known as:  MIRALAX / GLYCOLAX Take 17 g by mouth every other day.   PRESERVISION AREDS 2 PO Take 1 tablet by mouth 2 (two) times daily. What changed:  Another medication with the same name was removed. Continue taking this medication, and follow the directions you see here.   PROBIOTIC DAILY PO Take 1 tablet by mouth daily.   RA CALCIUM PLUS VITAMIN D 600-400 MG-UNIT tablet Generic drug:  Calcium Carbonate-Vitamin D Take 1 tablet by  mouth 2 (two) times daily.   RABEprazole 20 MG tablet Commonly known as:  ACIPHEX Take 1 tablet (20 mg total) by mouth 2 (two) times daily.   sucralfate 1 GM/10ML suspension Commonly known as:  CARAFATE Take 10 mLs (1 g total) by mouth every 6 (six) hours.   traMADol 50 MG tablet Commonly known as:  ULTRAM Take 1-2 tablets (50-100 mg total) by mouth every 4 (four) hours as needed for moderate pain.   vitamin B-12 1000 MCG tablet Commonly known as:  CYANOCOBALAMIN Take 1,000 mcg by mouth at bedtime.   warfarin 4 MG tablet Commonly known as:  COUMADIN Take 1 tablet (4 mg total) by mouth daily at 6 PM. What changed:  medication strength  how much to take  how to take this  when to take this        DISCHARGE INSTRUCTIONS:   1. PCP f/u in 1-2 weeks 2. GI f/u in 2-3 weeks  DIET:   Cardiac diet  ACTIVITY:   Activity as tolerated  OXYGEN:   Home Oxygen: No.  Oxygen Delivery: room air  DISCHARGE LOCATION:   home   If you experience worsening of your admission symptoms, develop shortness of breath, life threatening emergency, suicidal or homicidal thoughts you must seek medical  attention immediately by calling 911 or calling your MD immediately  if symptoms less severe.  You Must read complete instructions/literature along with all the possible adverse reactions/side effects for all the Medicines you take and that have been prescribed to you. Take any new Medicines after you have completely understood and accpet all the possible adverse reactions/side effects.   Please note  You were cared for by a hospitalist during your hospital stay. If you have any questions about your discharge medications or the care you received while you were in the hospital after you are discharged, you can call the unit and asked to speak with the hospitalist on call if the hospitalist that took care of you is not available. Once you are discharged, your primary care physician will handle any further medical issues. Please note that NO REFILLS for any discharge medications will be authorized once you are discharged, as it is imperative that you return to your primary care physician (or establish a relationship with a primary care physician if you do not have one) for your aftercare needs so that they can reassess your need for medications and monitor your lab values.    On the day of Discharge:  VITAL SIGNS:   Blood pressure (!) 151/93, pulse 100, temperature 98.2 F (36.8 C), temperature source Oral, resp. rate 16, height 4\' 8"  (1.422 m), weight 47.9 kg (105 lb 9.6 oz), SpO2 97 %.  PHYSICAL EXAMINATION:    GENERAL: 82 y.o.-year-old elderly patient lying in the bed with no acute distress.  EYES: Pupils equal, round, reactive to light and accommodation. No scleral icterus. Extraocular muscles intact.  HEENT: Head atraumatic, normocephalic. Oropharynx and nasopharynx clear.  NECK: Supple, no jugular venous distention. No thyroid enlargement, no tenderness.  LUNGS: Normal breath sounds bilaterally, no wheezing, rales,rhonchi or crepitation. No use of accessory muscles of respiration.    CARDIOVASCULAR: S1, S2  irregular. No rubs, or gallops. 2/6 systolic murmur present ABDOMEN: Soft, tender in the right upper quadrant with voluntary guarding,, nondistended. Bowel sounds present. No organomegaly or mass.  EXTREMITIES: No pedal edema, cyanosis, or clubbing.  NEUROLOGIC: Cranial nerves II through XII are intact. Muscle strength 5/5 in all extremities. Sensation intact. Gait  not checked.  PSYCHIATRIC: The patient is alert and oriented x 3.  SKIN: No obvious rash, lesion, or ulcer.    DATA REVIEW:   CBC  Recent Labs Lab 12/10/16 0741  WBC 9.6  HGB 12.8  HCT 38.3  PLT 351    Chemistries   Recent Labs Lab 12/08/16 1437 12/09/16 0428 12/10/16 0741  NA 138 139  --   K 3.8 3.8  --   CL 106 109  --   CO2 23 25  --   GLUCOSE 157* 99  --   BUN 19 14  --   CREATININE 1.05* 0.82  --   CALCIUM 8.5* 8.6*  --   MG 2.0  --   --   AST 39  --  22  ALT 84*  --  54  ALKPHOS 143*  --  131*  BILITOT 0.4  --  0.8     Microbiology Results  Results for orders placed or performed during the hospital encounter of 02/13/16  Surgical pcr screen     Status: None   Collection Time: 02/13/16  3:06 PM  Result Value Ref Range Status   MRSA, PCR NEGATIVE NEGATIVE Final   Staphylococcus aureus NEGATIVE NEGATIVE Final    Comment:        The Xpert SA Assay (FDA approved for NASAL specimens in patients over 81 years of age), is one component of a comprehensive surveillance program.  Test performance has been validated by [facility] for patients greater than or equal to 32 year old. It is not intended to diagnose infection nor to guide or monitor treatment.   Urine culture     Status: None   Collection Time: 02/13/16  3:06 PM  Result Value Ref Range Status   Specimen Description URINE, CLEAN CATCH  Final   Special Requests NONE  Final   Culture NO GROWTH 1 DAY  Final   Report Status 02/14/2016 FINAL  Final    RADIOLOGY:  No results found.   Management plans  discussed with the patient, family and they are in agreement.  CODE STATUS:  Code Status History    Date Active Date Inactive Code Status Order ID Comments User Context   12/08/2016 10:17 PM 12/11/2016  2:50 PM Full Code [id number]  Peggy Lighter, MD Inpatient   05/11/2016 11:00 PM 05/12/2016  5:29 PM Full Code [id number]  Nicholes Mango, MD Inpatient   01/15/2016 11:34 AM 01/16/2016  6:10 PM Full Code [id number]  Saundra Shelling, MD ED   03/11/2015  5:33 PM 03/12/2015  6:42 PM Full Code [id number]  Jonetta Osgood, MD Inpatient    Advance Directive Documentation   Flowsheet Row Most Recent Value  Type of Advance Directive  Healthcare Power of Attorney  Pre-existing out of facility DNR order (yellow form or [name] MOST form)  No data  "MOST" Form in Place?  No data      TOTAL TIME TAKING CARE OF THIS PATIENT: 37 minutes.    Peggy [name] M.D on 12/11/2016 at 3:37 PM  Between 7am to 6pm - Pager - [phone]  After 6pm go to [email or web address] - Proofreader  [employer] [place] [facility]  Office  [phone]  CC: Primary care physician; Peggy Pink, MD   Note: This dictation was prepared with Dragon dictation along with smaller phrase technology. Any transcriptional errors that result from this process are unintentional.

## 2016-12-11 NOTE — Progress Notes (Signed)
ANTICOAGULATION CONSULT NOTE - Initial Consult  Pharmacy Consult for Warfarin Indication: atrial fibrillation  Allergies  Allergen Reactions  . Alendronate Sodium     REACTION: questionable  . Amoxicillin-Pot Clavulanate   . Etodolac   . Ibandronate Sodium     REACTION: questionable  . Iodine     Other reaction(s): Unknown  . Lactose Intolerance (Gi) Other (See Comments)    GI problems  . Minocycline Hcl   . Risedronate Sodium   . Sulfonamide Derivatives Other (See Comments)    Stomach pain  . Zoledronic Acid     REACTION: "dental problems"    Patient Measurements: Height: 4\' 8"  (142.2 cm) Weight: 105 lb 9.6 oz (47.9 kg) IBW/kg (Calculated) : 36.3 Heparin Dosing Weight: 46.9 kg  Vital Signs: Temp: 98.2 F (36.8 C) (12/21 0841) Temp Source: Oral (12/21 0841) BP: 151/93 (12/21 0841) Pulse Rate: 100 (12/21 0841)  Labs:  Recent Labs  12/08/16 1437  12/08/16 2242 12/09/16 0428 12/09/16 1211 12/10/16 0741 12/11/16 0345  HGB 12.0  --   --  11.6*  --  12.8  --   HCT 35.4  --   --  34.5*  --  38.3  --   PLT 371  --   --  353  --  351  --   APTT 35  --   --   --   --   --   --   LABPROT 18.8*  --   --  19.0*  --  20.3* 24.4*  INR 1.56  --   --  1.58  --  1.71 2.16  HEPARINUNFRC  --   --   --  0.36 0.42  --   --   CREATININE 1.05*  --   --  0.82  --   --   --   TROPONINI <0.03  < > 0.04* 0.04* 0.03*  --   --   < > = values in this interval not displayed.  Estimated Creatinine Clearance: 31.8 mL/min (by C-G formula based on SCr of 0.82 mg/dL).   Medical History: Past Medical History:  Diagnosis Date  . Allergy   . Anginal pain ([place])    c/o angina on May 04, 2016 releived by Nitroglycerin X 2  . Anxiety   . Arthritis   . Atrial fibrillation ([place])   . Atrial fibrillation ([place])   . Atrial fibrillation with rapid ventricular response [facility]) November 2015   Heart rate up to 140 requiring IV Cardizem  . Breast cancer [facility]) 2012   Patient underwent a left  mastectomy on 03-31-11. Pathology showed DCIS at both sites without an invasive component. Sentinel nodes were negative. The breast lesions were noted to be ER +40%, PR-positive, 5%.   . Breast screening, unspecified   . Coronary artery disease   . Diverticulosis   . Dysrhythmia   . Essential hypertension, benign   . GERD (gastroesophageal reflux disease)   . Hearing disorder 2012   hearing aids  . Heart attack 2001  . Heart disease 2001  . Hip pain   . Hyperlipidemia   . Hypertension 1996  . Lump or mass in breast   . Malignant neoplasm of upper-inner quadrant of female breast ([place])   . Malignant neoplasm of upper-outer quadrant of female breast ([place])   . Other benign neoplasm of connective and other soft tissue of thorax   . Personal history of malignant neoplasm of breast   . Reflux    acid reflux  .  Shortness of breath dyspnea    with exertion  . Special screening for malignant neoplasms, colon   . Stroke [facility]) 2009  . Unspecified essential hypertension     Medications:  Scheduled:  . acidophilus  1 capsule Oral Daily  . aspirin EC  81 mg Oral Daily  . calcium-vitamin D  1 tablet Oral BID  . diltiazem  240 mg Oral Daily  . escitalopram  10 mg Oral QHS  . irbesartan  37.5 mg Oral Daily  . loratadine  10 mg Oral Daily  . metoprolol tartrate  25 mg Oral BID  . mirtazapine  15 mg Oral QHS  . multivitamin-lutein  1 capsule Oral BID  . pantoprazole  40 mg Oral BID AC  . polyethylene glycol  17 g Oral Daily  . sodium chloride flush  3 mL Intravenous Q12H  . sucralfate  1 g Oral Q6H  . vitamin B-12  1,000 mcg Oral QHS  . warfarin  3 mg Oral ONCE-1800  . Warfarin - Pharmacist Dosing Inpatient   Does not apply q1800   Infusions:    Assessment: 82 yo Female with Afibrillation, subtherapeutic on Warfarin. Patient on Warfarin 3 mg PTA per Med Rec.  Goal of Therapy:  Goal INR 2-3   Plan:  Patient scheduled for D/C today. If no d/c, Coumadin 3 mg tonight. Would recommend  an alternating dose of Coumadin 4 mg and 3 mg upon discharge.  Ulice Dash D 12/11/2016,11:26 AM

## 2016-12-24 ENCOUNTER — Ambulatory Visit: Payer: Medicare Other | Admitting: Nurse Practitioner

## 2017-01-01 ENCOUNTER — Ambulatory Visit (INDEPENDENT_AMBULATORY_CARE_PROVIDER_SITE_OTHER): Payer: Medicare Other | Admitting: Nurse Practitioner

## 2017-01-01 ENCOUNTER — Encounter: Payer: Self-pay | Admitting: Nurse Practitioner

## 2017-01-01 VITALS — BP 148/84 | HR 69 | Ht <= 58 in | Wt 114.2 lb

## 2017-01-01 DIAGNOSIS — R413 Other amnesia: Secondary | ICD-10-CM

## 2017-01-01 DIAGNOSIS — R269 Unspecified abnormalities of gait and mobility: Secondary | ICD-10-CM

## 2017-01-01 DIAGNOSIS — I4891 Unspecified atrial fibrillation: Secondary | ICD-10-CM | POA: Diagnosis not present

## 2017-01-01 DIAGNOSIS — Z8673 Personal history of transient ischemic attack (TIA), and cerebral infarction without residual deficits: Secondary | ICD-10-CM | POA: Diagnosis not present
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
## 2017-01-01 NOTE — Patient Instructions (Addendum)
Continue warfarin for secondary stroke prevention Strict control of hypertension with blood pressure goal below 130/90 today's reading 148/84  She was also advised fall prevention precautions

## 2017-01-11 NOTE — Progress Notes (Signed)
  Personally participated in and made any corrections needed to history, physical, neuro exam,assessment and plan as stated above and agree with plan.   Shiori Adcox, MD 

## 2017-04-01 ENCOUNTER — Other Ambulatory Visit: Payer: Self-pay

## 2017-04-01 DIAGNOSIS — Z1231 Encounter for screening mammogram for malignant neoplasm of breast: Secondary | ICD-10-CM

## 2017-04-02 ENCOUNTER — Telehealth: Payer: Self-pay

## 2017-04-02 NOTE — Telephone Encounter (Signed)
Message left for patient's daughter to see where she would like to have her mammograms done this year.

## 2017-04-03 NOTE — Telephone Encounter (Signed)
Peggy [name]'S PATIENT'S DAUGHTER CALLED TO LET us KNOW [facility] WOULD BE FINE FOR HER MOTHER'S MAMMOGRAM

## 2017-05-08 ENCOUNTER — Other Ambulatory Visit: Payer: Self-pay | Admitting: Physical Medicine and Rehabilitation

## 2017-05-08 DIAGNOSIS — M5412 Radiculopathy, cervical region: Secondary | ICD-10-CM

## 2017-05-21 ENCOUNTER — Ambulatory Visit
Admission: RE | Admit: 2017-05-21 | Discharge: 2017-05-21 | Disposition: A | Discharge status: Home / Self Care | Payer: Medicare Other | Source: Ambulatory Visit | Attending: Physical Medicine and Rehabilitation | Admitting: Physical Medicine and Rehabilitation

## 2017-05-21 DIAGNOSIS — M5412 Radiculopathy, cervical region: Secondary | ICD-10-CM

## 2017-05-29 ENCOUNTER — Ambulatory Visit
Admission: RE | Admit: 2017-05-29 | Discharge: 2017-05-29 | Disposition: A | Discharge status: Home / Self Care | Payer: Medicare Other | Source: Ambulatory Visit | Attending: Physical Medicine and Rehabilitation | Admitting: Physical Medicine and Rehabilitation

## 2017-05-29 DIAGNOSIS — M1288 Other specific arthropathies, not elsewhere classified, other specified site: Secondary | ICD-10-CM | POA: Diagnosis not present

## 2017-05-29 DIAGNOSIS — Z981 Arthrodesis status: Secondary | ICD-10-CM | POA: Diagnosis not present

## 2017-05-29 DIAGNOSIS — M5412 Radiculopathy, cervical region: Secondary | ICD-10-CM | POA: Diagnosis not present

## 2017-05-29 DIAGNOSIS — M47892 Other spondylosis, cervical region: Secondary | ICD-10-CM | POA: Insufficient documentation

## 2017-05-29 DIAGNOSIS — M4802 Spinal stenosis, cervical region: Secondary | ICD-10-CM | POA: Diagnosis not present

## 2017-05-29 DIAGNOSIS — M4313 Spondylolisthesis, cervicothoracic region: Secondary | ICD-10-CM | POA: Diagnosis not present

## 2017-06-11 ENCOUNTER — Ambulatory Visit
Admission: RE | Admit: 2017-06-11 | Discharge: 2017-06-11 | Disposition: A | Discharge status: Home / Self Care | Payer: Medicare Other | Source: Ambulatory Visit | Attending: General Surgery | Admitting: General Surgery

## 2017-06-11 DIAGNOSIS — Z1231 Encounter for screening mammogram for malignant neoplasm of breast: Secondary | ICD-10-CM | POA: Diagnosis not present

## 2017-06-18 ENCOUNTER — Encounter: Payer: Self-pay | Admitting: General Surgery

## 2017-06-18 ENCOUNTER — Ambulatory Visit (INDEPENDENT_AMBULATORY_CARE_PROVIDER_SITE_OTHER): Payer: Medicare Other | Admitting: General Surgery

## 2017-06-18 VITALS — BP 178/80 | HR 61 | Resp 16 | Ht <= 58 in | Wt 113.0 lb

## 2017-06-18 DIAGNOSIS — Z853 Personal history of malignant neoplasm of breast: Secondary | ICD-10-CM

## 2017-06-18 NOTE — Progress Notes (Signed)
Patient ID: Peggy [name], female   DOB: 05/15/1928, 83 y.o.   MRN: [id number]  Chief Complaint  Patient presents with  . Follow-up    mammogram    HPI Peggy [name] is a 83 y.o. female.  who presents for her follow up breast cancer and a breast evaluation. The most recent mammogram was done on 06-11-17.  Patient does perform regular self breast checks and gets regular mammograms done.  No new breast issues. Dr Ubaldo Glassing follows her atrial fibrillation. She is here with Peggy [name], her daughter. She still lives alone but is thinking about considering moving to [employer] or [employer].  HPI  Past Medical History:  Diagnosis Date  . Allergy   . Anginal pain ([place])    c/o angina on May 04, 2016 releived by Nitroglycerin X 2  . Anxiety   . Arthritis   . Atrial fibrillation ([place])   . Atrial fibrillation ([place])   . Atrial fibrillation with rapid ventricular response [facility]) November 2015   Heart rate up to 140 requiring IV Cardizem  . Breast cancer [facility]) 2012   Patient underwent a left mastectomy on 03-31-11. Pathology showed DCIS at both sites without an invasive component. Sentinel nodes were negative. The breast lesions were noted to be ER +40%, PR-positive, 5%.   . Breast screening, unspecified   . Coronary artery disease   . Diverticulosis   . Dysrhythmia   . Essential hypertension, benign   . GERD (gastroesophageal reflux disease)   . Hearing disorder 2012   hearing aids  . Heart attack ([place]) 2001  . Heart disease 2001  . Hip pain   . Hyperlipidemia   . Hypertension 1996  . Lump or mass in breast   . Malignant neoplasm of upper-inner quadrant of female breast ([place])   . Malignant neoplasm of upper-outer quadrant of female breast ([place])   . Other benign neoplasm of connective and other soft tissue of thorax   . Personal history of malignant neoplasm of breast   . Reflux    acid reflux  . Shortness of breath dyspnea    with exertion  . Special screening for malignant  neoplasms, colon   . Stroke [facility]) 2009  . Unspecified essential hypertension     Past Surgical History:  Procedure Laterality Date  . ABDOMINAL HYSTERECTOMY     age 49  . APPENDECTOMY     age 57  . BREAST BIOPSY     left breast biopsy over 15 years ago   . CHOLECYSTECTOMY     40 years ago   . COLON SURGERY     Dr. Tamala Julian, [facility]  . COLONOSCOPY  2009   [facility] Dr. Donnella Sham  . EXCISION OF BACK LESION Left 05/09/2016   Procedure: EXCISION OF GLUTEAL MASS;  Surgeon: Robert Bellow, MD;  Location: [facility] ORS;  Service: General;  Laterality: Left;  . EYE SURGERY Bilateral    Cataract Extraction with IOL  . HERNIA REPAIR    . JOINT REPLACEMENT Right    TOTAL HIP REPLACEMENT  . MASTECTOMY  2012   left breast  . SMALL INTESTINE SURGERY  12/31/2012   Abdominal exploration, lysis of adhesions for distal small bowel obstruction. Rochel Brome, MD  . TONSILLECTOMY     as a child  . TOTAL HIP ARTHROPLASTY Right 02/27/2016   Procedure: TOTAL HIP ARTHROPLASTY;  Surgeon: Dereck Leep, MD;  Location: [facility] ORS;  Service: Orthopedics;  Laterality: Right;  . UPPER GI ENDOSCOPY  2009  [facility] Dr. Donnella Sham    Family History  Problem Relation Age of Onset  . Heart disease Mother   . Stroke Father   . Heart disease Father   . Breast cancer Sister        x 2, in their 74's  . Lung cancer Brother   . Colon cancer Neg Hx   . Ovarian cancer Neg Hx     Social History Social History  Substance Use Topics  . Smoking status: Never Smoker  . Smokeless tobacco: Never Used  . Alcohol use No    Allergies  Allergen Reactions  . Alendronate Sodium     REACTION: questionable  . Amoxicillin-Pot Clavulanate   . Etodolac   . Ibandronate Sodium     REACTION: questionable  . Iodine     Other reaction(s): Unknown  . Lactose Intolerance (Gi) Other (See Comments)    GI problems  . Minocycline Hcl   . Risedronate Sodium   . Sulfonamide Derivatives Other (See Comments)    Stomach pain  . Zoledronic Acid      REACTION: "dental problems"    Current Outpatient Prescriptions  Medication Sig Dispense Refill  . acetaminophen (TYLENOL) 500 MG tablet Take 1,000 mg by mouth every 6 (six) hours as needed for mild pain.     Marland Kitchen ALPRAZolam (XANAX) 0.25 MG tablet Take 0.25 mg by mouth at bedtime as needed for anxiety or sleep.     Marland Kitchen aspirin EC 81 MG tablet Take 81 mg by mouth daily.    . Calcium Carbonate-Vitamin D (RA CALCIUM PLUS VITAMIN D) 600-400 MG-UNIT tablet Take 1 tablet by mouth 2 (two) times daily.    Marland Kitchen diltiazem (CARDIZEM CD) 240 MG 24 hr capsule Take 1 capsule (240 mg total) by mouth daily. 30 capsule 2  . docusate sodium (COLACE) 100 MG capsule Take 100 mg by mouth daily as needed for mild constipation.    Marland Kitchen escitalopram (LEXAPRO) 10 MG tablet Take 10 mg by mouth at bedtime.     . fexofenadine (ALLEGRA) 180 MG tablet Take 180 mg by mouth as needed for allergies or rhinitis.    . Menthol, Topical Analgesic, (BENGAY VANISHING SCENT) 2.5 % GEL Apply topically as needed (for pain).     . metoprolol tartrate (LOPRESSOR) 25 MG tablet Take 25 mg by mouth 2 (two) times daily.     . mirtazapine (REMERON) 15 MG tablet Take 15 mg by mouth at bedtime.    . Multiple Vitamins-Minerals (PRESERVISION AREDS 2 PO) Take 1 tablet by mouth 2 (two) times daily.    . nitroGLYCERIN (NITROSTAT) 0.4 MG SL tablet Place 0.4 mg under the tongue every 5 (five) minutes as needed for chest pain.     . polyethylene glycol (MIRALAX / GLYCOLAX) packet Take 17 g by mouth every other day. 14 each 0  . Probiotic Product (PROBIOTIC DAILY PO) Take 1 tablet by mouth daily.     . RABEprazole (ACIPHEX) 20 MG tablet Take 1 tablet (20 mg total) by mouth 2 (two) times daily. 60 tablet 2  . sucralfate (CARAFATE) 1 GM/10ML suspension Take 10 mLs (1 g total) by mouth every 6 (six) hours. 420 mL 0  . traMADol (ULTRAM) 50 MG tablet Take 1-2 tablets (50-100 mg total) by mouth every 4 (four) hours as needed for moderate pain. 30 tablet 0  .  vitamin B-12 (CYANOCOBALAMIN) 1000 MCG tablet Take 1,000 mcg by mouth at bedtime.     Marland Kitchen warfarin (COUMADIN) 4 MG tablet Take  1 tablet (4 mg total) by mouth daily at 6 PM. 30 tablet 2   No current facility-administered medications for this visit.     Review of Systems Review of Systems  Constitutional: Negative.   Respiratory: Negative.   Cardiovascular: Negative.     Blood pressure (!) 178/80, pulse 61, resp. rate 16, height 4\' 8"  (1.422 m), weight 113 lb (51.3 kg).  Physical Exam Physical Exam  Constitutional: She is oriented to person, place, and time. She appears well-developed and well-nourished.  HENT:  Mouth/Throat: Oropharynx is clear and moist.  Eyes: Conjunctivae are normal. No scleral icterus.  Neck: Neck supple.  Cardiovascular: Normal rate and regular rhythm.   Murmur heard. Pulmonary/Chest: Effort normal and breath sounds normal. Right breast exhibits no inverted nipple, no mass, no nipple discharge, no skin change and no tenderness.  Left mastectomy site clean  Lymphadenopathy:    She has no cervical adenopathy.    She has no axillary adenopathy.  Neurological: She is alert and oriented to person, place, and time.  Skin: Skin is warm and dry.  Psychiatric: Her behavior is normal.    Data Reviewed Recently completed mammograms of 06/11/2017 were independently reviewed. RECOMMENDATION: Screening mammogram in one year.  (Code:SM-R-67M)  BI-RADS CATEGORY  1: Negative.  Assessment    Benign breast exam.    Plan        Patient will be asked to return to the office in one year with a left screening mammogram.   HPI, Physical Exam, Assessment and Plan have been scribed under the direction and in the presence of Robert Bellow, MD. Peggy Fetch, RN  I have completed the exam and reviewed the above documentation for accuracy and completeness.  I agree with the above.  Haematologist has been used and any errors in dictation or transcription are  unintentional.  Hervey Ard, M.D., F.A.C.S.  Robert Bellow 06/19/2017, 8:01 PM

## 2017-06-18 NOTE — Patient Instructions (Signed)
The patient is aware to call back for any questions or concerns.  

## 2017-09-01 ENCOUNTER — Encounter: Payer: Self-pay | Admitting: Emergency Medicine

## 2017-09-01 ENCOUNTER — Observation Stay
Admission: EM | Admit: 2017-09-01 | Discharge: 2017-09-02 | Disposition: A | Discharge status: Home / Self Care | Payer: Medicare Other | Attending: Internal Medicine | Admitting: Internal Medicine

## 2017-09-01 DIAGNOSIS — Z888 Allergy status to other drugs, medicaments and biological substances status: Secondary | ICD-10-CM | POA: Insufficient documentation

## 2017-09-01 DIAGNOSIS — I252 Old myocardial infarction: Secondary | ICD-10-CM | POA: Diagnosis not present

## 2017-09-01 DIAGNOSIS — K625 Hemorrhage of anus and rectum: Secondary | ICD-10-CM | POA: Diagnosis not present

## 2017-09-01 DIAGNOSIS — Z79899 Other long term (current) drug therapy: Secondary | ICD-10-CM | POA: Diagnosis not present

## 2017-09-01 DIAGNOSIS — E785 Hyperlipidemia, unspecified: Secondary | ICD-10-CM | POA: Diagnosis not present

## 2017-09-01 DIAGNOSIS — Z7901 Long term (current) use of anticoagulants: Secondary | ICD-10-CM | POA: Diagnosis not present

## 2017-09-01 DIAGNOSIS — I10 Essential (primary) hypertension: Secondary | ICD-10-CM | POA: Insufficient documentation

## 2017-09-01 DIAGNOSIS — Z881 Allergy status to other antibiotic agents status: Secondary | ICD-10-CM | POA: Diagnosis not present

## 2017-09-01 DIAGNOSIS — K219 Gastro-esophageal reflux disease without esophagitis: Secondary | ICD-10-CM | POA: Diagnosis not present

## 2017-09-01 DIAGNOSIS — F419 Anxiety disorder, unspecified: Secondary | ICD-10-CM | POA: Insufficient documentation

## 2017-09-01 DIAGNOSIS — M1990 Unspecified osteoarthritis, unspecified site: Secondary | ICD-10-CM | POA: Insufficient documentation

## 2017-09-01 DIAGNOSIS — I2510 Atherosclerotic heart disease of native coronary artery without angina pectoris: Secondary | ICD-10-CM | POA: Diagnosis not present

## 2017-09-01 DIAGNOSIS — Z880 Allergy status to penicillin: Secondary | ICD-10-CM | POA: Diagnosis not present

## 2017-09-01 DIAGNOSIS — I4891 Unspecified atrial fibrillation: Secondary | ICD-10-CM | POA: Diagnosis not present

## 2017-09-01 LAB — TYPE AND SCREEN
ABO/RH(D): A POS
Antibody Screen: NEGATIVE

## 2017-09-01 LAB — CBC
HEMATOCRIT: 35.6 % (ref 35.0–47.0)
Hemoglobin: 12.2 g/dL (ref 12.0–16.0)
MCH: 31.8 pg (ref 26.0–34.0)
MCHC: 34.3 g/dL (ref 32.0–36.0)
MCV: 92.8 fL (ref 80.0–100.0)
Platelets: 277 10*3/uL (ref 150–440)
RBC: 3.83 MIL/uL (ref 3.80–5.20)
RDW: 13.1 % (ref 11.5–14.5)
WBC: 8.1 10*3/uL (ref 3.6–11.0)

## 2017-09-01 LAB — COMPREHENSIVE METABOLIC PANEL
ALK PHOS: 84 U/L (ref 38–126)
ALT: 12 U/L — ABNORMAL LOW (ref 14–54)
ANION GAP: 8 (ref 5–15)
AST: 20 U/L (ref 15–41)
Albumin: 3.4 g/dL — ABNORMAL LOW (ref 3.5–5.0)
BILIRUBIN TOTAL: 0.7 mg/dL (ref 0.3–1.2)
BUN: 20 mg/dL (ref 6–20)
CALCIUM: 9 mg/dL (ref 8.9–10.3)
CO2: 25 mmol/L (ref 22–32)
Chloride: 108 mmol/L (ref 101–111)
Creatinine, Ser: 0.89 mg/dL (ref 0.44–1.00)
GFR, EST NON AFRICAN AMERICAN: 57 mL/min — AB (ref >60)
Glucose, Bld: 97 mg/dL (ref 65–99)
POTASSIUM: 4 mmol/L (ref 3.5–5.1)
Sodium: 141 mmol/L (ref 135–145)
TOTAL PROTEIN: 6.2 g/dL — AB (ref 6.5–8.1)

## 2017-09-01 LAB — APTT: aPTT: 42 seconds — ABNORMAL HIGH (ref 24–36)

## 2017-09-01 LAB — PROTIME-INR
INR: 3.53
PROTHROMBIN TIME: 35.1 s — AB (ref 11.4–15.2)

## 2017-09-01 MED ORDER — CALCIUM CARBONATE-VITAMIN D 500-200 MG-UNIT PO TABS
1.0000 | ORAL_TABLET | Freq: Two times a day (BID) | ORAL | Status: DC
  Administered 2017-09-01 – 2017-09-02 (×2): 1 via ORAL
  Filled 2017-09-01 (×3): qty 1

## 2017-09-01 MED ORDER — OCUVITE-LUTEIN PO CAPS
1.0000 | ORAL_CAPSULE | Freq: Two times a day (BID) | ORAL | Status: DC
  Administered 2017-09-01 – 2017-09-02 (×2): 1 via ORAL
  Filled 2017-09-01 (×3): qty 1

## 2017-09-01 MED ORDER — NITROGLYCERIN 0.4 MG SL SUBL: 0.4000 mg | SUBLINGUAL_TABLET | SUBLINGUAL | Status: DC | PRN

## 2017-09-01 MED ORDER — DOCUSATE SODIUM 100 MG PO CAPS: 100.0000 mg | ORAL_CAPSULE | Freq: Two times a day (BID) | ORAL | Status: DC | PRN

## 2017-09-01 MED ORDER — ACETAMINOPHEN 325 MG PO TABS
325.0000 mg | ORAL_TABLET | Freq: Four times a day (QID) | ORAL | Status: DC | PRN
  Administered 2017-09-01: 325 mg via ORAL
  Filled 2017-09-01: qty 1

## 2017-09-01 MED ORDER — METOPROLOL TARTRATE 25 MG PO TABS
25.0000 mg | ORAL_TABLET | Freq: Two times a day (BID) | ORAL | Status: DC
  Administered 2017-09-01 – 2017-09-02 (×2): 25 mg via ORAL
  Filled 2017-09-01 (×2): qty 1

## 2017-09-01 MED ORDER — TRAMADOL HCL 50 MG PO TABS: 50.0000 mg | ORAL_TABLET | ORAL | Status: DC | PRN

## 2017-09-01 MED ORDER — ALPRAZOLAM 0.25 MG PO TABS
0.2500 mg | ORAL_TABLET | Freq: Every evening | ORAL | Status: DC | PRN
  Administered 2017-09-02: 0.25 mg via ORAL
  Filled 2017-09-01: qty 1

## 2017-09-01 MED ORDER — POLYETHYLENE GLYCOL 3350 17 G PO PACK: 17.0000 g | PACK | ORAL | Status: DC

## 2017-09-01 MED ORDER — LORATADINE 10 MG PO TABS: 10.0000 mg | ORAL_TABLET | Freq: Every day | ORAL | Status: DC | PRN

## 2017-09-01 MED ORDER — OXYCODONE-ACETAMINOPHEN 5-325 MG PO TABS: 0.5000 | ORAL_TABLET | Freq: Four times a day (QID) | ORAL | Status: DC | PRN

## 2017-09-01 MED ORDER — PANTOPRAZOLE SODIUM 40 MG PO TBEC
40.0000 mg | DELAYED_RELEASE_TABLET | Freq: Two times a day (BID) | ORAL | Status: DC
  Administered 2017-09-01 – 2017-09-02 (×2): 40 mg via ORAL
  Filled 2017-09-01 (×2): qty 1

## 2017-09-01 MED ORDER — DILTIAZEM HCL ER COATED BEADS 120 MG PO CP24
240.0000 mg | ORAL_CAPSULE | Freq: Every day | ORAL | Status: DC
  Administered 2017-09-01 – 2017-09-02 (×2): 240 mg via ORAL
  Filled 2017-09-01 (×2): qty 2

## 2017-09-01 MED ORDER — PHYTONADIONE 5 MG PO TABS: 5.0000 mg | ORAL_TABLET | Freq: Once | ORAL | Status: DC

## 2017-09-01 MED ORDER — MIRTAZAPINE 15 MG PO TABS
15.0000 mg | ORAL_TABLET | Freq: Every day | ORAL | Status: DC
  Administered 2017-09-01: 15 mg via ORAL
  Filled 2017-09-01: qty 1

## 2017-09-01 MED ORDER — PHYTONADIONE 5 MG PO TABS
2.5000 mg | ORAL_TABLET | Freq: Once | ORAL | Status: AC
  Administered 2017-09-01: 2.5 mg via ORAL
  Filled 2017-09-01: qty 1

## 2017-09-01 MED ORDER — RISAQUAD PO CAPS
1.0000 | ORAL_CAPSULE | Freq: Every day | ORAL | Status: DC
  Administered 2017-09-01 – 2017-09-02 (×2): 1 via ORAL
  Filled 2017-09-01 (×2): qty 1

## 2017-09-01 MED ORDER — DOCUSATE SODIUM 100 MG PO CAPS: 100.0000 mg | ORAL_CAPSULE | Freq: Every day | ORAL | Status: DC | PRN

## 2017-09-01 MED ORDER — ISOSORBIDE MONONITRATE ER 30 MG PO TB24
15.0000 mg | ORAL_TABLET | Freq: Every day | ORAL | Status: DC
  Administered 2017-09-01 – 2017-09-02 (×2): 15 mg via ORAL
  Filled 2017-09-01 (×2): qty 1

## 2017-09-01 MED ORDER — VITAMIN B-12 1000 MCG PO TABS
1000.0000 ug | ORAL_TABLET | Freq: Every day | ORAL | Status: DC
  Administered 2017-09-01: 1000 ug via ORAL
  Filled 2017-09-01 (×2): qty 1

## 2017-09-01 MED ORDER — ALUM & MAG HYDROXIDE-SIMETH 200-200-20 MG/5ML PO SUSP
30.0000 mL | ORAL | Status: DC | PRN
  Administered 2017-09-01: 30 mL via ORAL
  Filled 2017-09-01: qty 30

## 2017-09-01 MED ORDER — ESCITALOPRAM OXALATE 10 MG PO TABS
10.0000 mg | ORAL_TABLET | Freq: Every day | ORAL | Status: DC
  Administered 2017-09-01: 10 mg via ORAL
  Filled 2017-09-01 (×2): qty 1

## 2017-09-01 NOTE — ED Notes (Signed)
ED Provider at bedside. 

## 2017-09-01 NOTE — H&P (Signed)
[facility] [place] NAME: Peggy [name]    MR#:  [id number]  DATE OF BIRTH:  13-Apr-1928  DATE OF ADMISSION:  09/01/2017  PRIMARY CARE PHYSICIAN: Maryland Pink, MD   REQUESTING/REFERRING PHYSICIAN: [email or web address]  CHIEF COMPLAINT:   Chief Complaint  Patient presents with  . Rectal Bleeding    HISTORY OF PRESENT ILLNESS: Peggy [name]  is a 83 y.o. female with a known history of anxiety, atrial fibrillation,hyperlipidemia, hypertension, stroke- takes Coumadin every day at home. Noted to have dark color stool for last few days so brought to emergency room by her daughter. Her hemoglobin was stable in emergency room but she is on Coumadin and INR was 3.5 so ER physician suggested to monitor in hospital. Her last colonoscopy was long time ago and she did not had any abnormal findings at that time. Denies any abdominal pain or vomiting.  PAST MEDICAL HISTORY:   Past Medical History:  Diagnosis Date  . Allergy   . Anginal pain ([place])    c/o angina on May 04, 2016 releived by Nitroglycerin X 2  . Anxiety   . Arthritis   . Atrial fibrillation ([place])   . Atrial fibrillation ([place])   . Atrial fibrillation with rapid ventricular response [facility]) November 2015   Heart rate up to 140 requiring IV Cardizem  . Breast cancer [facility]) 2012   Patient underwent a left mastectomy on 03-31-11. Pathology showed DCIS at both sites without an invasive component. Sentinel nodes were negative. The breast lesions were noted to be ER +40%, PR-positive, 5%.   . Breast screening, unspecified   . Coronary artery disease   . Diverticulosis   . Dysrhythmia   . Essential hypertension, benign   . GERD (gastroesophageal reflux disease)   . Hearing disorder 2012   hearing aids  . Heart attack ([place]) 2001  . Heart disease 2001  . Hip pain   . Hyperlipidemia   . Hypertension 1996  . Lump or mass in breast   . Malignant neoplasm of upper-inner quadrant of female breast ([place])   .  Malignant neoplasm of upper-outer quadrant of female breast ([place])   . Other benign neoplasm of connective and other soft tissue of thorax   . Personal history of malignant neoplasm of breast   . Reflux    acid reflux  . Shortness of breath dyspnea    with exertion  . Special screening for malignant neoplasms, colon   . Stroke [facility]) 2009  . Unspecified essential hypertension     PAST SURGICAL HISTORY: Past Surgical History:  Procedure Laterality Date  . ABDOMINAL HYSTERECTOMY     age 66  . APPENDECTOMY     age 89  . BREAST BIOPSY     left breast biopsy over 15 years ago   . CHOLECYSTECTOMY     40 years ago   . COLON SURGERY     Dr. Tamala Julian, [facility]  . COLONOSCOPY  2009   [facility] Dr. Donnella Sham  . EXCISION OF BACK LESION Left 05/09/2016   Procedure: EXCISION OF GLUTEAL MASS;  Surgeon: Robert Bellow, MD;  Location: [facility] ORS;  Service: General;  Laterality: Left;  . EYE SURGERY Bilateral    Cataract Extraction with IOL  . HERNIA REPAIR    . JOINT REPLACEMENT Right    TOTAL HIP REPLACEMENT  . MASTECTOMY  2012   left breast  . SMALL INTESTINE SURGERY  12/31/2012   Abdominal exploration, lysis of adhesions for distal  small bowel obstruction. Rochel Brome, MD  . TONSILLECTOMY     as a child  . TOTAL HIP ARTHROPLASTY Right 02/27/2016   Procedure: TOTAL HIP ARTHROPLASTY;  Surgeon: Dereck Leep, MD;  Location: [facility] ORS;  Service: Orthopedics;  Laterality: Right;  . UPPER GI ENDOSCOPY  2009   [facility] Dr. Donnella Sham    SOCIAL HISTORY:  Social History  Substance Use Topics  . Smoking status: Never Smoker  . Smokeless tobacco: Never Used  . Alcohol use No    FAMILY HISTORY:  Family History  Problem Relation Age of Onset  . Heart disease Mother   . Stroke Father   . Heart disease Father   . Breast cancer Sister        x 2, in their 44's  . Lung cancer Brother   . Colon cancer Neg Hx   . Ovarian cancer Neg Hx     DRUG ALLERGIES:  Allergies  Allergen Reactions  . Alendronate  Sodium     REACTION: questionable  . Amoxicillin-Pot Clavulanate   . Etodolac   . Ibandronate Sodium     REACTION: questionable  . Iodine     Other reaction(s): Unknown  . Lactose Intolerance (Gi) Other (See Comments)    GI problems  . Minocycline Hcl   . Risedronate Sodium   . Sulfonamide Derivatives Other (See Comments)    Stomach pain  . Zoledronic Acid     REACTION: "dental problems"    REVIEW OF SYSTEMS:   CONSTITUTIONAL: No fever, fatigue or weakness.  EYES: No blurred or double vision.  EARS, NOSE, AND THROAT: No tinnitus or ear pain.  RESPIRATORY: No cough, shortness of breath, wheezing or hemoptysis.  CARDIOVASCULAR: No chest pain, orthopnea, edema.  GASTROINTESTINAL: No nausea, vomiting, diarrhea or abdominal pain.  GENITOURINARY: No dysuria, hematuria.  ENDOCRINE: No polyuria, nocturia,  HEMATOLOGY: No anemia, easy bruising or bleeding SKIN: No rash or lesion. MUSCULOSKELETAL: No joint pain or arthritis.   NEUROLOGIC: No tingling, numbness, weakness.  PSYCHIATRY: No anxiety or depression.   MEDICATIONS AT HOME:  Prior to Admission medications   Medication Sig Start Date End Date Taking? Authorizing Provider  acetaminophen (TYLENOL) 325 MG tablet Take 325 mg by mouth every 6 (six) hours as needed for mild pain.    Yes [provider]  ALPRAZolam (XANAX) 0.25 MG tablet Take 0.25 mg by mouth at bedtime as needed for anxiety or sleep.    Yes [provider]  aspirin EC 81 MG tablet Take 81 mg by mouth daily.   Yes [provider]  Calcium Carbonate-Vitamin D (RA CALCIUM PLUS VITAMIN D) 600-400 MG-UNIT tablet Take 1 tablet by mouth 2 (two) times daily.   Yes [provider]  diltiazem (CARDIZEM CD) 240 MG 24 hr capsule Take 1 capsule (240 mg total) by mouth daily. 12/12/16  Yes Gladstone Lighter, MD  docusate sodium (COLACE) 100 MG capsule Take 100 mg by mouth daily as needed for mild constipation.   Yes [provider]   escitalopram (LEXAPRO) 10 MG tablet Take 10 mg by mouth at bedtime.    Yes [provider]  fexofenadine (ALLEGRA) 180 MG tablet Take 180 mg by mouth as needed for allergies or rhinitis.   Yes [provider]  isosorbide mononitrate (IMDUR) 30 MG 24 hr tablet Take 15 mg by mouth daily. 08/07/17  Yes [provider]  Menthol, Topical Analgesic, (BENGAY VANISHING SCENT) 2.5 % GEL Apply topically as needed (for pain).  Yes [provider]  metoprolol tartrate (LOPRESSOR) 25 MG tablet Take 25 mg by mouth 2 (two) times daily. May take additional 25 mg if needed for heart rate.   Yes [provider]  mirtazapine (REMERON) 15 MG tablet Take 15 mg by mouth at bedtime.   Yes [provider]  Multiple Vitamins-Minerals (PRESERVISION AREDS 2 PO) Take 1 tablet by mouth 2 (two) times daily.   Yes [provider]  nitroGLYCERIN (NITROSTAT) 0.4 MG SL tablet Place 0.4 mg under the tongue every 5 (five) minutes as needed for chest pain.    Yes [provider]  oxyCODONE-acetaminophen (PERCOCET/ROXICET) 5-325 MG tablet Take 0.5-1 tablets by mouth 2 (two) times daily as needed for pain. 08/28/17  Yes [provider]  polyethylene glycol (MIRALAX / GLYCOLAX) packet Take 17 g by mouth every other day. 12/11/16  Yes Gladstone Lighter, MD  Probiotic Product (PROBIOTIC DAILY PO) Take 1 tablet by mouth daily.    Yes [provider]  RABEprazole (ACIPHEX) 20 MG tablet Take 1 tablet (20 mg total) by mouth 2 (two) times daily. 12/11/16  Yes Gladstone Lighter, MD  traMADol (ULTRAM) 50 MG tablet Take 1-2 tablets (50-100 mg total) by mouth every 4 (four) hours as needed for moderate pain. 02/28/16  Yes Watt Climes, PA  vitamin B-12 (CYANOCOBALAMIN) 1000 MCG tablet Take 1,000 mcg by mouth at bedtime.    Yes [provider]  warfarin (COUMADIN) 1 MG tablet Take 0.5 mg by mouth daily. 07/20/17  Yes [provider]  warfarin  (COUMADIN) 3 MG tablet Take 3 mg by mouth daily. 08/15/17  Yes [provider]  sucralfate (CARAFATE) 1 GM/10ML suspension Take 10 mLs (1 g total) by mouth every 6 (six) hours. Patient not taking: Reported on 09/01/2017 12/11/16   Gladstone Lighter, MD  warfarin (COUMADIN) 4 MG tablet Take 1 tablet (4 mg total) by mouth daily at 6 PM. Patient not taking: Reported on 09/01/2017 12/11/16   Gladstone Lighter, MD      PHYSICAL EXAMINATION:   VITAL SIGNS: Blood pressure (!) 165/85, pulse 94, temperature 98 F (36.7 C), temperature source Oral, resp. rate 18, height 4\' 8"  (1.422 m), weight 54.4 kg (120 lb), SpO2 99 %.  GENERAL:  83 y.o.-year-old patient lying in the bed with no acute distress.  EYES: Pupils equal, round, reactive to light and accommodation. No scleral icterus. Extraocular muscles intact.  HEENT: Head atraumatic, normocephalic. Oropharynx and nasopharynx clear.  NECK:  Supple, no jugular venous distention. No thyroid enlargement, no tenderness.  LUNGS: Normal breath sounds bilaterally, no wheezing, rales,rhonchi or crepitation. No use of accessory muscles of respiration.  CARDIOVASCULAR: S1, S2 normal. No murmurs, rubs, or gallops.  ABDOMEN: Soft, nontender, nondistended. Bowel sounds present. No organomegaly or mass.  EXTREMITIES: No pedal edema, cyanosis, or clubbing.  NEUROLOGIC: Cranial nerves II through XII are intact. Muscle strength 5/5 in all extremities. Sensation intact. Gait not checked.  PSYCHIATRIC: The patient is alert and oriented x 3.  SKIN: No obvious rash, lesion, or ulcer.   LABORATORY PANEL:   CBC  Recent Labs Lab 09/01/17 1023  WBC 8.1  HGB 12.2  HCT 35.6  PLT 277  MCV 92.8  MCH 31.8  MCHC 34.3  RDW 13.1   ------------------------------------------------------------------------------------------------------------------  Chemistries   Recent Labs Lab 09/01/17 1023  NA 141  K 4.0  CL 108  CO2 25  GLUCOSE 97  BUN 20  CREATININE  0.89  CALCIUM 9.0  AST 20  ALT 12*  ALKPHOS 84  BILITOT 0.7   ------------------------------------------------------------------------------------------------------------------ estimated creatinine clearance is 30.6 mL/min (by C-G formula based on SCr of 0.89 mg/dL). ------------------------------------------------------------------------------------------------------------------ No results for input(s): TSH, T4TOTAL, T3FREE, THYROIDAB in the last 72 hours.  Invalid input(s): FREET3   Coagulation profile  Recent Labs Lab 09/01/17 1023  INR 3.53   ------------------------------------------------------------------------------------------------------------------- No results for input(s): DDIMER in the last 72 hours. -------------------------------------------------------------------------------------------------------------------  Cardiac Enzymes No results for input(s): CKMB, TROPONINI, MYOGLOBIN in the last 168 hours.  Invalid input(s): CK ------------------------------------------------------------------------------------------------------------------ Invalid input(s): POCBNP  ---------------------------------------------------------------------------------------------------------------  Urinalysis    Component Value Date/Time   COLORURINE COLORLESS (A) 12/05/2016 1233   APPEARANCEUR CLEAR (A) 12/05/2016 1233   APPEARANCEUR Clear 10/23/2014 1605   LABSPEC 1.005 12/05/2016 1233   LABSPEC 1.011 10/23/2014 1605   PHURINE 7.0 12/05/2016 1233   GLUCOSEU NEGATIVE 12/05/2016 1233   GLUCOSEU Negative 10/23/2014 1605   HGBUR NEGATIVE 12/05/2016 1233   BILIRUBINUR NEGATIVE 12/05/2016 1233   BILIRUBINUR Negative 10/23/2014 1605   KETONESUR NEGATIVE 12/05/2016 1233   PROTEINUR NEGATIVE 12/05/2016 1233   UROBILINOGEN 0.2 03/11/2015 1430   NITRITE NEGATIVE 12/05/2016 1233   LEUKOCYTESUR NEGATIVE 12/05/2016 1233   LEUKOCYTESUR Negative 10/23/2014 1605     RADIOLOGY: No  results found.  EKG: Orders placed or performed during the hospital encounter of 09/01/17  . ED EKG  . ED EKG    IMPRESSION AND PLAN:  * acute lower GI bleed   Hemoglobin currently stable, continue monitoring. No need for transfusion.   Hold Coumadin and anticoagulation.  * atrial fibrillation   Hold Coumadin for now, rate is controlled.continue Cardizem.    * Coagulopathy  INR his high, given a small dose of oral vitamin K ,check tomorrow. I wod suggest to hold it for 2-3 days until the stool becomes clear and then resume.  * coronary artery disease   Hold aspirin continue beta blockers and Imdur.  * gastroesophageal reflux disease   Continue Protonix twice a day.  All the records are reviewed and case discussed with ED provider. Management plans discussed with the patient, family and they are in agreement.  CODE STATUS:    Code Status Orders        Start     Ordered   09/01/17 1702  Limited resuscitation (code)  Continuous    Question Answer Comment  In the event of cardiac or respiratory ARREST: Initiate Code Blue, Call Rapid Response Yes   In the event of cardiac or respiratory ARREST: Perform CPR Yes   In the event of cardiac or respiratory ARREST: Perform Intubation/Mechanical Ventilation No   In the event of cardiac or respiratory ARREST: Use NIPPV/BiPAp only if indicated Yes   In the event of cardiac or respiratory ARREST: Administer ACLS medications if indicated Yes   In the event of cardiac or respiratory ARREST: Perform Defibrillation or Cardioversion if indicated Yes   Comments confirmed again, as pt and her daughter told clearly like this now.      09/01/17 1702    Code Status History    Date Active Date Inactive Code Status Order ID Comments User Context   09/01/2017  3:44 PM 09/01/2017  5:02 PM DNR [id number]  Vaughan Basta, MD Inpatient   12/08/2016 10:17 PM 12/11/2016  2:50 PM Full Code [id number]  Gladstone Lighter, MD Inpatient   05/11/2016  11:00 PM 05/12/2016  5:29 PM Full Code [id number]  Nicholes Mango, MD Inpatient   01/15/2016 11:34 AM 01/16/2016  6:10 PM Full  Code [id number]  Saundra Shelling, MD ED   03/11/2015  5:33 PM 03/12/2015  6:42 PM Full Code [id number]  Jonetta Osgood, MD Inpatient    Advance Directive Documentation     Most Recent Value  Type of Advance Directive  Living will, Healthcare Power of Attorney  Pre-existing out of facility DNR order (yellow form or pink MOST form)  -  "MOST" Form in Place?  -     Discussed with her daughter in the room.  TOTAL TIME TAKING CARE OF THIS PATIENT: 50 minutes.    Vaughan Basta M.D on 09/01/2017   Between 7am to 6pm - Pager - [phone]  After 6pm go to [email or web address] - password [email or web address] [facility]  Office  [phone]  CC: Primary care physician; Maryland Pink, MD   Note: This dictation was prepared with Dragon dictation along with smaller phrase technology. Any transcriptional errors that result from this process are unintentional.

## 2017-09-01 NOTE — ED Triage Notes (Addendum)
Pt to ED via EMS from home with c/o rectal bleeding x1wk, pt on coumadin. pt A&Ox4, VS stable, pt does c/o some dizziness upon standing after pt took one nitro this am for slight CP.

## 2017-09-01 NOTE — ED Notes (Signed)
Pt given sandwich tray at this time  

## 2017-09-01 NOTE — ED Provider Notes (Signed)
[facility] Emergency Department Provider Note   ____________________________________________    I have reviewed the triage vital signs and the nursing notes.   HISTORY  Chief Complaint Rectal Bleeding     HPI Peggy [name] is a 83 y.o. female Presents with rectal bleeding. Patient reports bleeding over the last week, she reports her stool has been black at times. Typically when she has a bowel movement and wipes she reports was a bruise bloody as well. She is on Coumadin for atrial fibrillation. Had an INR checked 1 week ago which was unremarkable. Denies abdominal pain. Does have a history of diverticulosis as well. No fevers or chills or nausea or vomiting.patient reports dizziness when she stood up this morning   Past Medical History:  Diagnosis Date  . Allergy   . Anginal pain ([place])    c/o angina on May 04, 2016 releived by Nitroglycerin X 2  . Anxiety   . Arthritis   . Atrial fibrillation ([place])   . Atrial fibrillation ([place])   . Atrial fibrillation with rapid ventricular response [facility]) November 2015   Heart rate up to 140 requiring IV Cardizem  . Breast cancer [facility]) 2012   Patient underwent a left mastectomy on 03-31-11. Pathology showed DCIS at both sites without an invasive component. Sentinel nodes were negative. The breast lesions were noted to be ER +40%, PR-positive, 5%.   . Breast screening, unspecified   . Coronary artery disease   . Diverticulosis   . Dysrhythmia   . Essential hypertension, benign   . GERD (gastroesophageal reflux disease)   . Hearing disorder 2012   hearing aids  . Heart attack ([place]) 2001  . Heart disease 2001  . Hip pain   . Hyperlipidemia   . Hypertension 1996  . Lump or mass in breast   . Malignant neoplasm of upper-inner quadrant of female breast ([place])   . Malignant neoplasm of upper-outer quadrant of female breast ([place])   . Other benign neoplasm of connective and other soft tissue of thorax   .  Personal history of malignant neoplasm of breast   . Reflux    acid reflux  . Shortness of breath dyspnea    with exertion  . Special screening for malignant neoplasms, colon   . Stroke [facility]) 2009  . Unspecified essential hypertension     Patient Active Problem List   Diagnosis Date Noted  . Atrial flutter ([place]) 12/08/2016  . S/P total hip arthroplasty 02/27/2016  . Hip pain 01/15/2016  . Memory loss 03/21/2015  . Abnormality of gait 03/21/2015  . Atrial fibrillation with RVR ([place]) 03/11/2015  . H/O: CVA (cerebrovascular accident) 03/11/2015  . 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 Blurred vision 09/05/2013  . 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 EPIGASTRIC PAIN 02/14/2009  . NONSPECIFIC ABN FINDNG RAD&OTH EXAM BILARY TRCT 02/14/2009    Past Surgical History:  Procedure Laterality Date  . ABDOMINAL HYSTERECTOMY     age 77  . APPENDECTOMY     age 77  . BREAST BIOPSY     left breast biopsy over 15 years ago   . CHOLECYSTECTOMY     40 years ago   . COLON SURGERY     Dr. Tamala Julian, [facility]  . COLONOSCOPY  2009   [facility] Dr. Donnella Sham  . EXCISION OF BACK LESION Left 05/09/2016   Procedure: EXCISION OF GLUTEAL MASS;  Surgeon: Dellis Filbert  Amedeo Kinsman, MD;  Location: [facility] ORS;  Service: General;  Laterality: Left;  . EYE SURGERY Bilateral    Cataract Extraction with IOL  . HERNIA REPAIR    . JOINT REPLACEMENT Right    TOTAL HIP REPLACEMENT  . MASTECTOMY  2012   left breast  . SMALL INTESTINE SURGERY  12/31/2012   Abdominal exploration, lysis of adhesions for distal small bowel obstruction. Rochel Brome, MD  . TONSILLECTOMY     as a child  . TOTAL HIP ARTHROPLASTY Right 02/27/2016   Procedure: TOTAL HIP ARTHROPLASTY;  Surgeon: Dereck Leep, MD;  Location: [facility] ORS;  Service: Orthopedics;  Laterality: Right;  . UPPER GI ENDOSCOPY  2009   [facility] Dr. Donnella Sham    Prior to Admission medications   Medication Sig  Start Date End Date Taking? Authorizing Provider  acetaminophen (TYLENOL) 500 MG tablet Take 1,000 mg by mouth every 6 (six) hours as needed for mild pain.     [provider]  ALPRAZolam Duanne Moron) 0.25 MG tablet Take 0.25 mg by mouth at bedtime as needed for anxiety or sleep.     [provider]  aspirin EC 81 MG tablet Take 81 mg by mouth daily.    [provider]  Calcium Carbonate-Vitamin D (RA CALCIUM PLUS VITAMIN D) 600-400 MG-UNIT tablet Take 1 tablet by mouth 2 (two) times daily.    [provider]  diltiazem (CARDIZEM CD) 240 MG 24 hr capsule Take 1 capsule (240 mg total) by mouth daily. 12/12/16   Gladstone Lighter, MD  docusate sodium (COLACE) 100 MG capsule Take 100 mg by mouth daily as needed for mild constipation.    [provider]  escitalopram (LEXAPRO) 10 MG tablet Take 10 mg by mouth at bedtime.     [provider]  fexofenadine (ALLEGRA) 180 MG tablet Take 180 mg by mouth as needed for allergies or rhinitis.    [provider]  Menthol, Topical Analgesic, (BENGAY VANISHING SCENT) 2.5 % GEL Apply topically as needed (for pain).     [provider]  metoprolol tartrate (LOPRESSOR) 25 MG tablet Take 25 mg by mouth 2 (two) times daily.     [provider]  mirtazapine (REMERON) 15 MG tablet Take 15 mg by mouth at bedtime.    [provider]  Multiple Vitamins-Minerals (PRESERVISION AREDS 2 PO) Take 1 tablet by mouth 2 (two) times daily.    [provider]  nitroGLYCERIN (NITROSTAT) 0.4 MG SL tablet Place 0.4 mg under the tongue every 5 (five) minutes as needed for chest pain.     [provider]  polyethylene glycol (MIRALAX / GLYCOLAX) packet Take 17 g by mouth every other day. 12/11/16   Gladstone Lighter, MD  Probiotic Product (PROBIOTIC DAILY PO) Take 1 tablet by mouth daily.     [provider]  RABEprazole (ACIPHEX) 20 MG tablet Take 1 tablet (20 mg total) by  mouth 2 (two) times daily. 12/11/16   Gladstone Lighter, MD  sucralfate (CARAFATE) 1 GM/10ML suspension Take 10 mLs (1 g total) by mouth every 6 (six) hours. 12/11/16   Gladstone Lighter, MD  traMADol (ULTRAM) 50 MG tablet Take 1-2 tablets (50-100 mg total) by mouth every 4 (four) hours as needed for moderate pain. 02/28/16   Watt Climes, PA  vitamin B-12 (CYANOCOBALAMIN) 1000 MCG tablet Take 1,000 mcg by mouth at bedtime.     [provider]  warfarin (COUMADIN) 4 MG tablet Take 1 tablet (4 mg total)  by mouth daily at 6 PM. 12/11/16   Gladstone Lighter, MD     Allergies Alendronate sodium; Amoxicillin-pot clavulanate; Etodolac; Ibandronate sodium; Iodine; Lactose intolerance (gi); Minocycline hcl; Risedronate sodium; Sulfonamide derivatives; and Zoledronic acid  Family History  Problem Relation Age of Onset  . Heart disease Mother   . Stroke Father   . Heart disease Father   . Breast cancer Sister        x 2, in their 65's  . Lung cancer Brother   . Colon cancer Neg Hx   . Ovarian cancer Neg Hx     Social History Social History  Substance Use Topics  . Smoking status: Never Smoker  . Smokeless tobacco: Never Used  . Alcohol use No    Review of Systems  Constitutional: No fever/chills Eyes: No visual changes.  ENT: No sore throat. Cardiovascular: Denies chest pain. Respiratory: Denies shortness of breath. Gastrointestinal:as above  Genitourinary: Negative for dysuria. Musculoskeletal: Negative for back pain. Skin: Negative for rash. Neurological: Negative for headaches   ____________________________________________   PHYSICAL EXAM:  VITAL SIGNS: ED Triage Vitals  Enc Vitals Group     BP 09/01/17 1019 (!) 162/97     Pulse Rate 09/01/17 1019 78     Resp 09/01/17 1019 16     Temp 09/01/17 1019 98.3 F (36.8 C)     Temp Source 09/01/17 1019 Oral     SpO2 09/01/17 1019 98 %     Weight 09/01/17 1022 54.4 kg (120 lb)     Height 09/01/17 1022 1.422 m (4'  8")     Head Circumference --      Peak Flow --      Pain Score 09/01/17 1015 0     Pain Loc --      Pain Edu? --      Excl. in [place]? --     Constitutional: Alert and oriented. No acute distress. Pleasant and interactive Eyes: Conjunctivae are normal.   Nose: No congestion/rhinnorhea. Mouth/Throat: Mucous membranes are moist.    Cardiovascular: Normal rate, regular rhythm. Grossly normal heart sounds.  Good peripheral circulation. Respiratory: Normal respiratory effort.  No retractions. Lungs CTAB. Gastrointestinal: Soft and nontender. No distention.  No CVA tenderness.guaiac positive brown stool Genitourinary: deferred Musculoskeletal:  Warm and well perfused Neurologic:  Normal speech and language. No gross focal neurologic deficits are appreciated.  Skin:  Skin is warm, dry and intact. No rash noted. Psychiatric: Mood and affect are normal. Speech and behavior are normal.  ____________________________________________   LABS (all labs ordered are listed, but only abnormal results are displayed)  Labs Reviewed  COMPREHENSIVE METABOLIC PANEL - Abnormal; Notable for the following:       Result Value   Total Protein 6.2 (*)    Albumin 3.4 (*)    ALT 12 (*)    GFR calc non Af Amer 57 (*)    All other components within normal limits  APTT - Abnormal; Notable for the following:    aPTT 42 (*)    All other components within normal limits  PROTIME-INR - Abnormal; Notable for the following:    Prothrombin Time 35.1 (*)    All other components within normal limits  CBC  TYPE AND SCREEN  TYPE AND SCREEN   ____________________________________________  EKG  ED ECG REPORT I, Lavonia Drafts, the attending physician, personally viewed and interpreted this ECG.  Date: 09/01/2017  Rhythm: normal sinus rhythm QRS Axis: Left axis deviation Intervals: normal ST/T Wave abnormalities:  normal   ____________________________________________  RADIOLOGY  None ____________________________________________   PROCEDURES  Procedure(s) performed: No    Critical Care performed: No ____________________________________________   INITIAL IMPRESSION / ASSESSMENT AND PLAN / ED COURSE  Pertinent labs & imaging results that were available during my care of the patient were reviewed by me and considered in my medical decision making (see chart for details).  Patient overall well-appearing and in no acute distress. Vital signs are unremarkable. However she complains of GI bleed on Coumadin, we will check labs, place IV and monitor carefully.  Patient's hemoglobin is reassuring, vitals remained stable. INR is slightly elevated but do not feel reversal is necessary at this time, we'll admit to the hospital service for observation.   ____________________________________________   FINAL CLINICAL IMPRESSION(S) / ED DIAGNOSES  Final diagnoses:  Rectal bleeding      NEW MEDICATIONS STARTED DURING THIS VISIT:  New Prescriptions   No medications on file     Note:  This document was prepared using Dragon voice recognition software and may include unintentional dictation errors.    Lavonia Drafts, MD 09/01/17 1209

## 2017-09-01 NOTE — ED Notes (Signed)
Family at bedside. 

## 2017-09-02 DIAGNOSIS — K625 Hemorrhage of anus and rectum: Secondary | ICD-10-CM | POA: Diagnosis not present

## 2017-09-02 LAB — CBC
HEMATOCRIT: 34.3 % — AB (ref 35.0–47.0)
Hemoglobin: 12 g/dL (ref 12.0–16.0)
MCH: 32.6 pg (ref 26.0–34.0)
MCHC: 35.1 g/dL (ref 32.0–36.0)
MCV: 92.9 fL (ref 80.0–100.0)
PLATELETS: 262 10*3/uL (ref 150–440)
RBC: 3.69 MIL/uL — AB (ref 3.80–5.20)
RDW: 13.2 % (ref 11.5–14.5)
WBC: 9.4 10*3/uL (ref 3.6–11.0)

## 2017-09-02 LAB — BASIC METABOLIC PANEL
ANION GAP: 6 (ref 5–15)
BUN: 18 mg/dL (ref 6–20)
CHLORIDE: 110 mmol/L (ref 101–111)
CO2: 26 mmol/L (ref 22–32)
CREATININE: 0.94 mg/dL (ref 0.44–1.00)
Calcium: 8.6 mg/dL — ABNORMAL LOW (ref 8.9–10.3)
GFR calc non Af Amer: 53 mL/min — ABNORMAL LOW (ref >60)
Glucose, Bld: 117 mg/dL — ABNORMAL HIGH (ref 65–99)
POTASSIUM: 3.6 mmol/L (ref 3.5–5.1)
SODIUM: 142 mmol/L (ref 135–145)

## 2017-09-02 LAB — PROTIME-INR
INR: 2.37
Prothrombin Time: 25.7 seconds — ABNORMAL HIGH (ref 11.4–15.2)

## 2017-09-02 NOTE — Discharge Instructions (Addendum)
[place] at [place]:  Cardiac diet  DISCHARGE CONDITION:  Stable  ACTIVITY:  Activity as tolerated  OXYGEN:  Home Oxygen: No.   Oxygen Delivery: room air  DISCHARGE LOCATION:  home    ADDITIONAL DISCHARGE INSTRUCTION: Hold coumadin until saturaday  If you experience worsening of your admission symptoms, develop shortness of breath, life threatening emergency, suicidal or homicidal thoughts you must seek medical attention immediately by calling 911 or calling your MD immediately  if symptoms less severe.  You Must read complete instructions/literature along with all the possible adverse reactions/side effects for all the Medicines you take and that have been prescribed to you. Take any new Medicines after you have completely understood and accpet all the possible adverse reactions/side effects.   Please note  You were cared for by a hospitalist during your hospital stay. If you have any questions about your discharge medications or the care you received while you were in the hospital after you are discharged, you can call the unit and asked to speak with the hospitalist on call if the hospitalist that took care of you is not available. Once you are discharged, your primary care physician will handle any further medical issues. Please note that NO REFILLS for any discharge medications will be authorized once you are discharged, as it is imperative that you return to your primary care physician (or establish a relationship with a primary care physician if you do not have one) for your aftercare needs so that they can reassess your need for medications and monitor your lab values.

## 2017-09-02 NOTE — Care Management Obs Status (Signed)
[place] NOTIFICATION   Patient Details  Name: Peggy [name] MRN: [id number] Date of Birth: 01-Apr-1928   Medicare Observation Status Notification Given:  No (Admitted obs less than 24 hours)    Beverly Sessions, RN 09/02/2017, 12:20 PM

## 2017-09-02 NOTE — Progress Notes (Signed)
Pt discharged per MD order. Discharge paperwork reviewed with pt and daughter. All questions answered to satisfaction. IV removed with small skin tear to right forearm noted and dressed. Pt taken to car in a wheelchair by volunteer.

## 2017-09-02 NOTE — Discharge Summary (Signed)
[facility] [place] [facility], 83 y.o., DOB [birth year], MRN [id number]. Admission date: 09/01/2017 Discharge Date 09/02/2017 Primary MD Maryland Pink, MD Admitting Physician Vaughan Basta, MD  Admission Diagnosis  Rectal bleeding [K62.5]  Discharge Diagnosis   Principal Problem:   Rectal bleed etiology unclear now resolved Will need outpatient GI follow  atrial fibrillation with chronic anticoagulation Osteoarthritis History of breast cancer Coronary arteries History of diverticula Essential hypertension GERD hyperlipidemia       Hospital Course Peggy [name]  is a 83 y.o. female with a known history of anxiety, atrial fibrillation,hyperlipidemia, hypertension, stroke- takes Coumadin every day at home. Noted to have dark color stool for last few days so brought to emergency room by her daughter. Her hemoglobin was stable in emergency room but she is on Coumadin and INR was 3.5 so ER physician suggested to monitor in hospital.patient's hemoglobin has remained stable. There was no significant change in her hemoglobin. Her stools are now normal.I discussed with patient and daughter regarding gi evaluation because of her age and problems with anesthesia in past they did not want her evaluated currently. I recommended that they can hold the Coumadin for the next 3 days. And be seen by their outpatient GI she may need endoscopy. Also explained to them that she starts having dark color stools need to return to the emergency room.            Consults  None  Significant Tests:  See full reports for all details    No results found.     Today   Subjective:   Peggy [name] patient feels well denies any complaints  Objective:   Blood pressure 103/63, pulse 73, temperature 99.7 F (37.6 C), temperature source Oral, resp. rate 18, height 4\' 8"  (1.422 m), weight 120 lb (54.4 kg), SpO2 96 %.  .  Intake/Output Summary (Last 24 hours)  at 09/02/17 1528 Last data filed at 09/02/17 0900  Gross per 24 hour  Intake              360 ml  Output              500 ml  Net             -140 ml    Exam VITAL SIGNS: Blood pressure 103/63, pulse 73, temperature 99.7 F (37.6 C), temperature source Oral, resp. rate 18, height 4\' 8"  (1.422 m), weight 120 lb (54.4 kg), SpO2 96 %.  GENERAL:  83 y.o.-year-old patient lying in the bed with no acute distress.  EYES: Pupils equal, round, reactive to light and accommodation. No scleral icterus. Extraocular muscles intact.  HEENT: Head atraumatic, normocephalic. Oropharynx and nasopharynx clear.  NECK:  Supple, no jugular venous distention. No thyroid enlargement, no tenderness.  LUNGS: Normal breath sounds bilaterally, no wheezing, rales,rhonchi or crepitation. No use of accessory muscles of respiration.  CARDIOVASCULAR: S1, S2 normal. No murmurs, rubs, or gallops.  ABDOMEN: Soft, nontender, nondistended. Bowel sounds present. No organomegaly or mass.  EXTREMITIES: No pedal edema, cyanosis, or clubbing.  NEUROLOGIC: Cranial nerves II through XII are intact. Muscle strength 5/5 in all extremities. Sensation intact. Gait not checked.  PSYCHIATRIC: The patient is alert and oriented x 3.  SKIN: No obvious rash, lesion, or ulcer.   Data Review     CBC w Diff: Lab Results  Component Value Date   WBC 9.4 09/02/2017   HGB 12.0 09/02/2017   HGB 11.2 (L) 10/25/2014  HCT 34.3 (L) 09/02/2017   HCT 36.0 10/25/2014   PLT 262 09/02/2017   PLT 307 10/25/2014   LYMPHOPCT 22 11/27/2016   LYMPHOPCT 38.2 10/25/2014   MONOPCT 11 11/27/2016   MONOPCT 10.1 10/25/2014   EOSPCT 1 11/27/2016   EOSPCT 2.5 10/25/2014   BASOPCT 0 11/27/2016   BASOPCT 1.2 10/25/2014   CMP: Lab Results  Component Value Date   NA 142 09/02/2017   NA 143 10/24/2014   K 3.6 09/02/2017   K 4.0 10/24/2014   CL 110 09/02/2017   CL 109 (H) 10/24/2014   CO2 26 09/02/2017   CO2 26 10/24/2014   BUN 18 09/02/2017   BUN  13 10/24/2014   CREATININE 0.94 09/02/2017   CREATININE 0.73 10/24/2014   PROT 6.2 (L) 09/01/2017   PROT 6.4 10/24/2014   ALBUMIN 3.4 (L) 09/01/2017   ALBUMIN 2.9 (L) 10/24/2014   BILITOT 0.7 09/01/2017   BILITOT 0.7 10/24/2014   ALKPHOS 84 09/01/2017   ALKPHOS 71 10/24/2014   AST 20 09/01/2017   AST 28 10/24/2014   ALT 12 (L) 09/01/2017   ALT 27 10/24/2014  .  Micro Results No results found for this or any previous visit (from the past 240 hour(s)).      Code Status Orders        Start     Ordered   09/01/17 1702  Limited resuscitation (code)  Continuous    Question Answer Comment  In the event of cardiac or respiratory ARREST: Initiate Code Blue, Call Rapid Response Yes   In the event of cardiac or respiratory ARREST: Perform CPR Yes   In the event of cardiac or respiratory ARREST: Perform Intubation/Mechanical Ventilation No   In the event of cardiac or respiratory ARREST: Use NIPPV/BiPAp only if indicated Yes   In the event of cardiac or respiratory ARREST: Administer ACLS medications if indicated Yes   In the event of cardiac or respiratory ARREST: Perform Defibrillation or Cardioversion if indicated Yes   Comments confirmed again, as pt and her daughter told clearly like this now.      09/01/17 1702    Code Status History    Date Active Date Inactive Code Status Order ID Comments User Context   09/01/2017  3:44 PM 09/01/2017  5:02 PM DNR [id number]  Vaughan Basta, MD Inpatient   12/08/2016 10:17 PM 12/11/2016  2:50 PM Full Code [id number]  Gladstone Lighter, MD Inpatient   05/11/2016 11:00 PM 05/12/2016  5:29 PM Full Code [id number]  Nicholes Mango, MD Inpatient   01/15/2016 11:34 AM 01/16/2016  6:10 PM Full Code [id number]  Saundra Shelling, MD ED   03/11/2015  5:33 PM 03/12/2015  6:42 PM Full Code [id number]  Jonetta Osgood, MD Inpatient    Advance Directive Documentation     Most Recent Value  Type of Advance Directive  Living will, Healthcare Power of Attorney   Pre-existing out of facility DNR order (yellow form or pink MOST form)  -  "MOST" Form in Place?  -          Follow-up Information    [place] Pink, MD Follow up in 1 week(s).   Specialty:  Family Medicine Contact information: [place] [address] [phone]        Lollie Sails, MD Follow up.   Specialty:  Gastroenterology Why:  asap for dark tarry stools Contact information: [facility] [place] [address] [phone]  Discharge Medications   Allergies as of 09/02/2017      Reactions   Alendronate Sodium    REACTION: questionable   Amoxicillin-pot Clavulanate    Etodolac    Ibandronate Sodium    REACTION: questionable   Iodine    Other reaction(s): Unknown   Lactose Intolerance (gi) Other (See Comments)   GI problems   Minocycline Hcl    Risedronate Sodium    Sulfonamide Derivatives Other (See Comments)   Stomach pain   Zoledronic Acid    REACTION: "dental problems"      Medication List    STOP taking these medications   warfarin 1 MG tablet Commonly known as:  COUMADIN   warfarin 3 MG tablet Commonly known as:  COUMADIN   warfarin 4 MG tablet Commonly known as:  COUMADIN     TAKE these medications   acetaminophen 325 MG tablet Commonly known as:  TYLENOL Take 325 mg by mouth every 6 (six) hours as needed for mild pain.   ALPRAZolam 0.25 MG tablet Commonly known as:  XANAX Take 0.25 mg by mouth at bedtime as needed for anxiety or sleep.   aspirin EC 81 MG tablet Take 81 mg by mouth daily.   BENGAY VANISHING SCENT 2.5 % Gel Generic drug:  Menthol (Topical Analgesic) Apply topically as needed (for pain).   diltiazem 240 MG 24 hr capsule Commonly known as:  CARDIZEM CD Take 1 capsule (240 mg total) by mouth daily.   docusate sodium 100 MG capsule Commonly known as:  COLACE Take 100 mg by mouth daily as needed for mild constipation.    escitalopram 10 MG tablet Commonly known as:  LEXAPRO Take 10 mg by mouth at bedtime.   fexofenadine 180 MG tablet Commonly known as:  ALLEGRA Take 180 mg by mouth as needed for allergies or rhinitis.   isosorbide mononitrate 30 MG 24 hr tablet Commonly known as:  IMDUR Take 15 mg by mouth daily.   metoprolol tartrate 25 MG tablet Commonly known as:  LOPRESSOR Take 25 mg by mouth 2 (two) times daily. May take additional 25 mg if needed for heart rate.   mirtazapine 15 MG tablet Commonly known as:  REMERON Take 15 mg by mouth at bedtime.   nitroGLYCERIN 0.4 MG SL tablet Commonly known as:  NITROSTAT Place 0.4 mg under the tongue every 5 (five) minutes as needed for chest pain.   oxyCODONE-acetaminophen 5-325 MG tablet Commonly known as:  PERCOCET/ROXICET Take 0.5-1 tablets by mouth 2 (two) times daily as needed for pain.   polyethylene glycol packet Commonly known as:  MIRALAX / GLYCOLAX Take 17 g by mouth every other day.   PRESERVISION AREDS 2 PO Take 1 tablet by mouth 2 (two) times daily.   PROBIOTIC DAILY PO Take 1 tablet by mouth daily.   RA CALCIUM PLUS VITAMIN D 600-400 MG-UNIT tablet Generic drug:  Calcium Carbonate-Vitamin D Take 1 tablet by mouth 2 (two) times daily.   RABEprazole 20 MG tablet Commonly known as:  ACIPHEX Take 1 tablet (20 mg total) by mouth 2 (two) times daily.   sucralfate 1 GM/10ML suspension Commonly known as:  CARAFATE Take 10 mLs (1 g total) by mouth every 6 (six) hours.   traMADol 50 MG tablet Commonly known as:  ULTRAM Take 1-2 tablets (50-100 mg total) by mouth every 4 (four) hours as needed for moderate pain.   vitamin B-12 1000 MCG tablet Commonly known as:  CYANOCOBALAMIN Take 1,000 mcg by mouth at bedtime.  Total Time in preparing paper work, data evaluation and todays exam - 35 minutes  Dustin Flock M.D on 09/02/2017 at 3:28 PM  [facility]   Office  [phone]

## 2017-09-16 ENCOUNTER — Encounter: Payer: Self-pay | Admitting: Emergency Medicine

## 2017-09-16 ENCOUNTER — Emergency Department: Payer: Medicare Other

## 2017-09-16 ENCOUNTER — Emergency Department
Admission: EM | Admit: 2017-09-16 | Discharge: 2017-09-16 | Disposition: A | Discharge status: Home / Self Care | Payer: Medicare Other | Attending: Emergency Medicine | Admitting: Emergency Medicine

## 2017-09-16 DIAGNOSIS — R1030 Lower abdominal pain, unspecified: Secondary | ICD-10-CM | POA: Insufficient documentation

## 2017-09-16 DIAGNOSIS — Z79899 Other long term (current) drug therapy: Secondary | ICD-10-CM | POA: Diagnosis not present

## 2017-09-16 DIAGNOSIS — Z7982 Long term (current) use of aspirin: Secondary | ICD-10-CM | POA: Diagnosis not present

## 2017-09-16 DIAGNOSIS — Z96641 Presence of right artificial hip joint: Secondary | ICD-10-CM | POA: Diagnosis not present

## 2017-09-16 DIAGNOSIS — R197 Diarrhea, unspecified: Secondary | ICD-10-CM | POA: Insufficient documentation

## 2017-09-16 DIAGNOSIS — Z8673 Personal history of transient ischemic attack (TIA), and cerebral infarction without residual deficits: Secondary | ICD-10-CM | POA: Diagnosis not present

## 2017-09-16 DIAGNOSIS — I10 Essential (primary) hypertension: Secondary | ICD-10-CM | POA: Insufficient documentation

## 2017-09-16 DIAGNOSIS — Z853 Personal history of malignant neoplasm of breast: Secondary | ICD-10-CM | POA: Diagnosis not present

## 2017-09-16 DIAGNOSIS — Z9012 Acquired absence of left breast and nipple: Secondary | ICD-10-CM | POA: Insufficient documentation

## 2017-09-16 DIAGNOSIS — I119 Hypertensive heart disease without heart failure: Secondary | ICD-10-CM | POA: Insufficient documentation

## 2017-09-16 DIAGNOSIS — I2510 Atherosclerotic heart disease of native coronary artery without angina pectoris: Secondary | ICD-10-CM | POA: Insufficient documentation

## 2017-09-16 LAB — CBC WITH DIFFERENTIAL/PLATELET
BASOS PCT: 1 %
Basophils Absolute: 0.1 10*3/uL (ref 0–0.1)
EOS ABS: 0.4 10*3/uL (ref 0–0.7)
Eosinophils Relative: 5 %
HEMATOCRIT: 35.6 % (ref 35.0–47.0)
Hemoglobin: 12.2 g/dL (ref 12.0–16.0)
Lymphocytes Relative: 32 %
Lymphs Abs: 2.5 10*3/uL (ref 1.0–3.6)
MCH: 32.1 pg (ref 26.0–34.0)
MCHC: 34.4 g/dL (ref 32.0–36.0)
MCV: 93.4 fL (ref 80.0–100.0)
MONO ABS: 0.8 10*3/uL (ref 0.2–0.9)
MONOS PCT: 10 %
Neutro Abs: 4 10*3/uL (ref 1.4–6.5)
Neutrophils Relative %: 52 %
Platelets: 340 10*3/uL (ref 150–440)
RBC: 3.81 MIL/uL (ref 3.80–5.20)
RDW: 13.2 % (ref 11.5–14.5)
WBC: 7.8 10*3/uL (ref 3.6–11.0)

## 2017-09-16 LAB — LIPASE, BLOOD: Lipase: 25 U/L (ref 11–51)

## 2017-09-16 LAB — HEPATIC FUNCTION PANEL
ALK PHOS: 90 U/L (ref 38–126)
ALT: 11 U/L — AB (ref 14–54)
AST: 26 U/L (ref 15–41)
Albumin: 3.6 g/dL (ref 3.5–5.0)
BILIRUBIN DIRECT: 0.1 mg/dL (ref 0.1–0.5)
BILIRUBIN INDIRECT: 0.6 mg/dL (ref 0.3–0.9)
Total Bilirubin: 0.7 mg/dL (ref 0.3–1.2)
Total Protein: 7.1 g/dL (ref 6.5–8.1)

## 2017-09-16 LAB — BASIC METABOLIC PANEL
Anion gap: 12 (ref 5–15)
BUN: 19 mg/dL (ref 6–20)
CHLORIDE: 105 mmol/L (ref 101–111)
CO2: 23 mmol/L (ref 22–32)
Calcium: 9.4 mg/dL (ref 8.9–10.3)
Creatinine, Ser: 1.09 mg/dL — ABNORMAL HIGH (ref 0.44–1.00)
GFR calc Af Amer: 51 mL/min — ABNORMAL LOW (ref >60)
GFR calc non Af Amer: 44 mL/min — ABNORMAL LOW (ref >60)
GLUCOSE: 102 mg/dL — AB (ref 65–99)
POTASSIUM: 4.7 mmol/L (ref 3.5–5.1)
Sodium: 140 mmol/L (ref 135–145)

## 2017-09-16 LAB — PROTIME-INR
INR: 2.62
Prothrombin Time: 27.8 seconds — ABNORMAL HIGH (ref 11.4–15.2)

## 2017-09-16 MED ORDER — SODIUM CHLORIDE 0.9 % IV BOLUS (SEPSIS)
1000.0000 mL | Freq: Once | INTRAVENOUS | Status: AC
  Administered 2017-09-16: 1000 mL via INTRAVENOUS

## 2017-09-16 NOTE — Discharge Instructions (Signed)
Fortunately today your blood work and your CT scan were reassuring. Please continue your high fiber diet and follow-up with your gastroenterologist if your symptoms do not improve within one week.  It was a pleasure to take care of you today, and thank you for coming to our emergency department.  If you have any questions or concerns before leaving please ask the nurse to grab me and I'm more than happy to go through your aftercare instructions again.  If you were prescribed any opioid pain medication today such as Norco, Vicodin, Percocet, morphine, hydrocodone, or oxycodone please make sure you do not drive when you are taking this medication as it can alter your ability to drive safely.  If you have any concerns once you are home that you are not improving or are in fact getting worse before you can make it to your follow-up appointment, please do not hesitate to call 911 and come back for further evaluation.  Darel Hong, MD  Results for orders placed or performed during the hospital encounter of 62/83/15  Basic metabolic panel  Result Value Ref Range   Sodium 140 135 - 145 mmol/L   Potassium 4.7 3.5 - 5.1 mmol/L   Chloride 105 101 - 111 mmol/L   CO2 23 22 - 32 mmol/L   Glucose, Bld 102 (H) 65 - 99 mg/dL   BUN 19 6 - 20 mg/dL   Creatinine, Ser 1.09 (H) 0.44 - 1.00 mg/dL   Calcium 9.4 8.9 - 10.3 mg/dL   GFR calc non Af Amer 44 (L) >60 mL/min   GFR calc Af Amer 51 (L) >60 mL/min   Anion gap 12 5 - 15  Hepatic function panel  Result Value Ref Range   Total Protein 7.1 6.5 - 8.1 g/dL   Albumin 3.6 3.5 - 5.0 g/dL   AST 26 15 - 41 U/L   ALT 11 (L) 14 - 54 U/L   Alkaline Phosphatase 90 38 - 126 U/L   Total Bilirubin 0.7 0.3 - 1.2 mg/dL   Bilirubin, Direct 0.1 0.1 - 0.5 mg/dL   Indirect Bilirubin 0.6 0.3 - 0.9 mg/dL  Lipase, blood  Result Value Ref Range   Lipase 25 11 - 51 U/L  CBC with Differential  Result Value Ref Range   WBC 7.8 3.6 - 11.0 K/uL   RBC 3.81 3.80 - 5.20  MIL/uL   Hemoglobin 12.2 12.0 - 16.0 g/dL   HCT 35.6 35.0 - 47.0 %   MCV 93.4 80.0 - 100.0 fL   MCH 32.1 26.0 - 34.0 pg   MCHC 34.4 32.0 - 36.0 g/dL   RDW 13.2 11.5 - 14.5 %   Platelets 340 150 - 440 K/uL   Neutrophils Relative % 52 %   Neutro Abs 4.0 1.4 - 6.5 K/uL   Lymphocytes Relative 32 %   Lymphs Abs 2.5 1.0 - 3.6 K/uL   Monocytes Relative 10 %   Monocytes Absolute 0.8 0.2 - 0.9 K/uL   Eosinophils Relative 5 %   Eosinophils Absolute 0.4 0 - 0.7 K/uL   Basophils Relative 1 %   Basophils Absolute 0.1 0 - 0.1 K/uL   Ct Renal Stone Study  Result Date: 09/16/2017 CLINICAL DATA:  Diarrhea for several days, abdominal pain, history essential benign hypertension, stroke, atrial fibrillation, breast cancer EXAM: CT ABDOMEN AND PELVIS WITHOUT CONTRAST TECHNIQUE: Multidetector CT imaging of the abdomen and pelvis was performed following the standard protocol without IV contrast. Oral contrast is present in the colon.  Sagittal and coronal MPR images reconstructed from axial data set. COMPARISON:  12/05/2016 FINDINGS: Lower chest: 3 mm nodular density LEFT lung base image 4 unchanged. 1-2 mm diameter RIGHT lung base nodule image 2 unchanged. 3 mm RIGHT lung base nodule image 3 unchanged. 4 mm RIGHT lung base nodule image 1 in RIGHT middle lobe unchanged. Hepatobiliary: Gallbladder surgically absent.  Liver unremarkable. Pancreas: Atrophic pancreas without mass Spleen: Normal appearance Adrenals/Urinary Tract: Stable adrenal glands without definite mass. No definite renal mass, hydronephrosis, hydroureter or obstructing urinary tract calculus. Bladder and ureters unremarkable. Stomach/Bowel: Appendix not visualized. Scattered colonic diverticulosis without definite evidence of diverticulitis. Gaseous distention of stomach no definite mass or wall thickening. Bowel loops otherwise unremarkable. Vascular/Lymphatic: Atherosclerotic calcifications aorta with upper normal diameter 2.8 x 2.5 cm infrarenal  image 28. No definite adenopathy. Reproductive: Uterus surgically absent with nonvisualization of RIGHT ovary and questionable visualization of unknown trophic LEFT ovary. Other: No free air or free fluid. No inflammatory process or hernia. Musculoskeletal: Diffuse osseous demineralization. RIGHT hip prosthesis. IMPRESSION: Tiny lung base nodules, grossly stable. Colonic diverticulosis without definite evidence of diverticulitis. Upper normal caliber of an atherosclerotic abdominal aorta. No definite acute intra-abdominal or intrapelvic abnormalities. Aortic Atherosclerosis (ICD10-I70.0). Electronically Signed   By: Lavonia Dana M.D.   On: 09/16/2017 18:26

## 2017-09-16 NOTE — ED Provider Notes (Signed)
[facility] Emergency Department Provider Note  ____________________________________________   First MD Initiated Contact with Patient 09/16/17 1647     (approximate)  I have reviewed the triage vital signs and the nursing notes.   HISTORY  Chief Complaint Diarrhea   HPI Peggy [name] is a 83 y.o. female who comes to the emergency department withseveral days of loose watery stools. She says she's had 3-4 episodes of diarrhea today and 3-4 yesterday and the day before. She does have mild to moderate lower cramping abdominal discomfort worse when defecating improved when not defecating. She denies any recent antibiotics.she has a remote abdominal surgical history of hysterectomy, appendectomy, and an unknown colon surgery. Nothing in particular seems to make her symptoms better or worse.   12/09/16 Echo:  Study Conclusions  - Left ventricle: The cavity size was normal. Wall thickness was   normal. Systolic function was normal. The estimated ejection   fraction was in the range of 55% to 65%. - Aortic valve: There was very mild stenosis. Valve area (VTI):   2.34 cm^2. Valve area (Vmax): 2 cm^2. Valve area (Vmean): 2.26   cm^2.   Past Medical History:  Diagnosis Date  . Allergy   . Anginal pain ([place])    c/o angina on May 04, 2016 releived by Nitroglycerin X 2  . Anxiety   . Arthritis   . Atrial fibrillation ([place])   . Atrial fibrillation ([place])   . Atrial fibrillation with rapid ventricular response [facility]) November 2015   Heart rate up to 140 requiring IV Cardizem  . Breast cancer [facility]) 2012   Patient underwent a left mastectomy on 03-31-11. Pathology showed DCIS at both sites without an invasive component. Sentinel nodes were negative. The breast lesions were noted to be ER +40%, PR-positive, 5%.   . Breast screening, unspecified   . Coronary artery disease   . Diverticulosis   . Dysrhythmia   . Essential hypertension, benign   . GERD  (gastroesophageal reflux disease)   . Hearing disorder 2012   hearing aids  . Heart attack ([place]) 2001  . Heart disease 2001  . Hip pain   . Hyperlipidemia   . Hypertension 1996  . Lump or mass in breast   . Malignant neoplasm of upper-inner quadrant of female breast ([place])   . Malignant neoplasm of upper-outer quadrant of female breast ([place])   . Other benign neoplasm of connective and other soft tissue of thorax   . Personal history of malignant neoplasm of breast   . Reflux    acid reflux  . Shortness of breath dyspnea    with exertion  . Special screening for malignant neoplasms, colon   . Stroke [facility]) 2009  . Unspecified essential hypertension     Patient Active Problem List   Diagnosis Date Noted  . Rectal bleed 09/01/2017  . Atrial flutter ([place]) 12/08/2016  . S/P total hip arthroplasty 02/27/2016  . Hip pain 01/15/2016  . Memory loss 03/21/2015  . Abnormality of gait 03/21/2015  . Atrial fibrillation with RVR ([place]) 03/11/2015  . H/O: CVA (cerebrovascular accident) 03/11/2015  . Right temporal lobe infarction ([place]) 04/10/2014  . Blurred vision 09/05/2013  . 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 EPIGASTRIC PAIN 02/14/2009  . NONSPECIFIC ABN FINDNG RAD&OTH EXAM BILARY TRCT 02/14/2009    Past Surgical History:  Procedure Laterality Date  . ABDOMINAL HYSTERECTOMY     age 62  .  APPENDECTOMY     age 47  . BREAST BIOPSY     left breast biopsy over 15 years ago   . CHOLECYSTECTOMY     40 years ago   . COLON SURGERY     Dr. Tamala Julian, [facility]  . COLONOSCOPY  2009   [facility] Dr. Donnella Sham  . EXCISION OF BACK LESION Left 05/09/2016   Procedure: EXCISION OF GLUTEAL MASS;  Surgeon: Robert Bellow, MD;  Location: [facility] ORS;  Service: General;  Laterality: Left;  . EYE SURGERY Bilateral    Cataract Extraction with IOL  . HERNIA REPAIR    . JOINT REPLACEMENT Right    TOTAL HIP REPLACEMENT  . MASTECTOMY  2012    left breast  . SMALL INTESTINE SURGERY  12/31/2012   Abdominal exploration, lysis of adhesions for distal small bowel obstruction. Rochel Brome, MD  . TONSILLECTOMY     as a child  . TOTAL HIP ARTHROPLASTY Right 02/27/2016   Procedure: TOTAL HIP ARTHROPLASTY;  Surgeon: Dereck Leep, MD;  Location: [facility] ORS;  Service: Orthopedics;  Laterality: Right;  . UPPER GI ENDOSCOPY  2009   [facility] Dr. Donnella Sham    Prior to Admission medications   Medication Sig Start Date End Date Taking? Authorizing Provider  acetaminophen (TYLENOL) 325 MG tablet Take 325 mg by mouth every 6 (six) hours as needed for mild pain.     [provider]  ALPRAZolam Duanne Moron) 0.25 MG tablet Take 0.25 mg by mouth at bedtime as needed for anxiety or sleep.     [provider]  aspirin EC 81 MG tablet Take 81 mg by mouth daily.    [provider]  Calcium Carbonate-Vitamin D (RA CALCIUM PLUS VITAMIN D) 600-400 MG-UNIT tablet Take 1 tablet by mouth 2 (two) times daily.    [provider]  diltiazem (CARDIZEM CD) 240 MG 24 hr capsule Take 1 capsule (240 mg total) by mouth daily. 12/12/16   Gladstone Lighter, MD  docusate sodium (COLACE) 100 MG capsule Take 100 mg by mouth daily as needed for mild constipation.    [provider]  escitalopram (LEXAPRO) 10 MG tablet Take 10 mg by mouth at bedtime.     [provider]  fexofenadine (ALLEGRA) 180 MG tablet Take 180 mg by mouth as needed for allergies or rhinitis.    [provider]  isosorbide mononitrate (IMDUR) 30 MG 24 hr tablet Take 15 mg by mouth daily. 08/07/17   [provider]  Menthol, Topical Analgesic, (BENGAY VANISHING SCENT) 2.5 % GEL Apply topically as needed (for pain).     [provider]  metoprolol tartrate (LOPRESSOR) 25 MG tablet Take 25 mg by mouth 2 (two) times daily. May take additional 25 mg if needed for heart rate.    [provider]  mirtazapine (REMERON) 15 MG tablet Take 15  mg by mouth at bedtime.    [provider]  Multiple Vitamins-Minerals (PRESERVISION AREDS 2 PO) Take 1 tablet by mouth 2 (two) times daily.    [provider]  nitroGLYCERIN (NITROSTAT) 0.4 MG SL tablet Place 0.4 mg under the tongue every 5 (five) minutes as needed for chest pain.     [provider]  oxyCODONE-acetaminophen (PERCOCET/ROXICET) 5-325 MG tablet Take 0.5-1 tablets by mouth 2 (two) times daily as needed for pain. 08/28/17   [provider]  polyethylene glycol (MIRALAX / GLYCOLAX) packet Take 17 g by mouth every other day. 12/11/16   Gladstone Lighter, MD  Probiotic  Product (PROBIOTIC DAILY PO) Take 1 tablet by mouth daily.     [provider]  RABEprazole (ACIPHEX) 20 MG tablet Take 1 tablet (20 mg total) by mouth 2 (two) times daily. 12/11/16   Gladstone Lighter, MD  sucralfate (CARAFATE) 1 GM/10ML suspension Take 10 mLs (1 g total) by mouth every 6 (six) hours. Patient not taking: Reported on 09/01/2017 12/11/16   Gladstone Lighter, MD  traMADol (ULTRAM) 50 MG tablet Take 1-2 tablets (50-100 mg total) by mouth every 4 (four) hours as needed for moderate pain. 02/28/16   Watt Climes, PA  vitamin B-12 (CYANOCOBALAMIN) 1000 MCG tablet Take 1,000 mcg by mouth at bedtime.     [provider]    Allergies Alendronate sodium; Amoxicillin-pot clavulanate; Etodolac; Ibandronate sodium; Iodine; Lactose intolerance (gi); Minocycline hcl; Risedronate sodium; Sulfonamide derivatives; and Zoledronic acid  Family History  Problem Relation Age of Onset  . Heart disease Mother   . Stroke Father   . Heart disease Father   . Breast cancer Sister        x 2, in their 30's  . Lung cancer Brother   . Colon cancer Neg Hx   . Ovarian cancer Neg Hx     Social History Social History  Substance Use Topics  . Smoking status: Never Smoker  . Smokeless tobacco: Never Used  . Alcohol use No    Review of Systems Constitutional: No  fever/chills Eyes: No visual changes. ENT: No sore throat. Cardiovascular: Denies chest pain. Respiratory: Denies shortness of breath. Gastrointestinal: positive for abdominal pain.  No nausea, no vomiting.  positive for diarrhea.  No constipation. Genitourinary: Negative for dysuria. Musculoskeletal: Negative for back pain. Skin: Negative for rash. Neurological: Negative for headaches, focal weakness or numbness.   ____________________________________________   PHYSICAL EXAM:  VITAL SIGNS: ED Triage Vitals  Enc Vitals Group     BP      Pulse      Resp      Temp      Temp src      SpO2      Weight      Height      Head Circumference      Peak Flow      Pain Score      Pain Loc      Pain Edu?      Excl. in [place]?     Constitutional: alert and oriented 4 pleasant cooperative speaks in full clear sentences no diaphoresis Eyes: PERRL EOMI. Head: Atraumatic. Nose: No congestion/rhinnorhea. Mouth/Throat: No trismus Neck: No stridor.   Cardiovascular: Normal rate, regular rhythm. Grossly normal heart sounds.  Good peripheral circulation. Respiratory: Normal respiratory effort.  No retractions. Lungs CTAB and moving good air Gastrointestinal: soft nondistended mild lower abdominal tenderness with no focality no rebound or guarding and no frank peritonitis Musculoskeletal: No lower extremity edema   Neurologic:  Normal speech and language. No gross focal neurologic deficits are appreciated. Skin:  Skin is warm, dry and intact. No rash noted. Psychiatric: Mood and affect are normal. Speech and behavior are normal.    ____________________________________________   DIFFERENTIAL includes but not limited to  infectious diarrhea, inflammatory diarrhea, diverticulitis, volvulus, small bowel obstruction ____________________________________________   LABS (all labs ordered are listed, but only abnormal results are displayed)  Labs Reviewed  BASIC METABOLIC PANEL - Abnormal;  Notable for the following:       Result Value   Glucose, Bld 102 (*)    Creatinine, Ser  1.09 (*)    GFR calc non Af Amer 44 (*)    GFR calc Af Amer 51 (*)    All other components within normal limits  HEPATIC FUNCTION PANEL - Abnormal; Notable for the following:    ALT 11 (*)    All other components within normal limits  PROTIME-INR - Abnormal; Notable for the following:    Prothrombin Time 27.8 (*)    All other components within normal limits  GASTROINTESTINAL PANEL BY PCR, STOOL (REPLACES STOOL CULTURE)  LIPASE, BLOOD  CBC WITH DIFFERENTIAL/PLATELET    blood work reviewed and interpreted by me is normal __________________________________________  EKG   ____________________________________________  RADIOLOGY  CT stone reviewed by me with no acute disease noted ____________________________________________   PROCEDURES  Procedure(s) performed: no  Procedures  Critical Care performed: no  Observation: no ____________________________________________   INITIAL IMPRESSION / ASSESSMENT AND PLAN / ED COURSE  Pertinent labs & imaging results that were available during my care of the patient were reviewed by me and considered in my medical decision making (see chart for details).  Differential and an 83 year old with abdominal pain and diarrhea is extremely broad. I will begin with labs fluids and a CT scan of her abdomen and pelvis. She is afebrile and not toxic and I doubt C. difficile at this time.     ----------------------------------------- 5:18 PM on 09/16/2017 -----------------------------------------  The patient's daughter arrived and is able to give further collateral history. The patient saw a gastroenterologist yesterday who feels that the diarrhea secondary to chronic constipation. She began taking fiber this morning however felt weak and lightheaded so that prompted the visit to the  ER. ____________________________________________   ----------------------------------------- 6:49 PM on 09/16/2017 -----------------------------------------  The patient has been unable to defecate and provide a sample while here in the emergency department. Doubt infectious etiology of her symptoms. Her CT scan is unremarkable. She does have follow-up with her gastroenterologist within the next week. She is medically stable for outpatient management understands and agrees the plan.  FINAL CLINICAL IMPRESSION(S) / ED DIAGNOSES  Final diagnoses:  Diarrhea, unspecified type      NEW MEDICATIONS STARTED DURING THIS VISIT:  Discharge Medication List as of 09/16/2017  6:47 PM       Note:  This document was prepared using Dragon voice recognition software and may include unintentional dictation errors.     Darel Hong, MD 09/16/17 [phone]

## 2017-09-16 NOTE — ED Notes (Signed)
Patient transported to CT 

## 2017-09-16 NOTE — ED Triage Notes (Signed)
Pt arrived via EMS from home for reports of diarrhea for several days. Pt denies pain on arrival. Pt reports some nausea but denies vomiting. EMS reports VSS.

## 2018-01-17 ENCOUNTER — Emergency Department
Admission: EM | Admit: 2018-01-17 | Discharge: 2018-01-17 | Disposition: A | Discharge status: Home / Self Care | Payer: Medicare Other | Attending: Student in an Organized Health Care Education/Training Program | Admitting: Student in an Organized Health Care Education/Training Program

## 2018-01-17 ENCOUNTER — Encounter: Payer: Self-pay | Admitting: Emergency Medicine

## 2018-01-17 ENCOUNTER — Other Ambulatory Visit: Payer: Self-pay

## 2018-01-17 ENCOUNTER — Emergency Department: Payer: Medicare Other

## 2018-01-17 DIAGNOSIS — I2510 Atherosclerotic heart disease of native coronary artery without angina pectoris: Secondary | ICD-10-CM | POA: Insufficient documentation

## 2018-01-17 DIAGNOSIS — I10 Essential (primary) hypertension: Secondary | ICD-10-CM | POA: Diagnosis not present

## 2018-01-17 DIAGNOSIS — Z96641 Presence of right artificial hip joint: Secondary | ICD-10-CM | POA: Insufficient documentation

## 2018-01-17 DIAGNOSIS — Z853 Personal history of malignant neoplasm of breast: Secondary | ICD-10-CM | POA: Insufficient documentation

## 2018-01-17 DIAGNOSIS — Z7982 Long term (current) use of aspirin: Secondary | ICD-10-CM | POA: Insufficient documentation

## 2018-01-17 DIAGNOSIS — Z79899 Other long term (current) drug therapy: Secondary | ICD-10-CM | POA: Diagnosis not present

## 2018-01-17 DIAGNOSIS — R079 Chest pain, unspecified: Secondary | ICD-10-CM | POA: Insufficient documentation

## 2018-01-17 DIAGNOSIS — Z7901 Long term (current) use of anticoagulants: Secondary | ICD-10-CM | POA: Insufficient documentation

## 2018-01-17 LAB — CBC
HEMATOCRIT: 36.2 % (ref 35.0–47.0)
HEMOGLOBIN: 12 g/dL (ref 12.0–16.0)
MCH: 30.6 pg (ref 26.0–34.0)
MCHC: 33.3 g/dL (ref 32.0–36.0)
MCV: 92 fL (ref 80.0–100.0)
Platelets: 284 10*3/uL (ref 150–440)
RBC: 3.93 MIL/uL (ref 3.80–5.20)
RDW: 14.4 % (ref 11.5–14.5)
WBC: 6.4 10*3/uL (ref 3.6–11.0)

## 2018-01-17 LAB — TROPONIN I: Troponin I: <0.03 ng/mL (ref <0.03)

## 2018-01-17 LAB — PROTIME-INR
INR: 2.36
Prothrombin Time: 25.6 seconds — ABNORMAL HIGH (ref 11.4–15.2)

## 2018-01-17 LAB — BASIC METABOLIC PANEL
ANION GAP: 9 (ref 5–15)
BUN: 17 mg/dL (ref 6–20)
CO2: 23 mmol/L (ref 22–32)
Calcium: 9.1 mg/dL (ref 8.9–10.3)
Chloride: 105 mmol/L (ref 101–111)
Creatinine, Ser: 1.13 mg/dL — ABNORMAL HIGH (ref 0.44–1.00)
GFR calc Af Amer: 49 mL/min — ABNORMAL LOW (ref >60)
GFR calc non Af Amer: 42 mL/min — ABNORMAL LOW (ref >60)
GLUCOSE: 181 mg/dL — AB (ref 65–99)
Potassium: 3.9 mmol/L (ref 3.5–5.1)
Sodium: 137 mmol/L (ref 135–145)

## 2018-01-17 NOTE — ED Notes (Signed)
Pt's legal guardian (daughter Ronee Ranganathan [phone]) notified of pt discharge and plan of care.

## 2018-01-17 NOTE — ED Notes (Signed)
Patient transported to X-ray 

## 2018-01-17 NOTE — Initial Assessments (Signed)
Pt assessed. Lung sounds clear, S1&2 heard. Vitals documented by RN, Eyvonne Mechanic. Daughter at bedside.

## 2018-01-17 NOTE — ED Notes (Signed)
Pt's daughter at bedside reports medication pt brought is metoprolol 25mg  pt takes it BID and if palpitation in between schedule medication to administered an extra dose per pt's cardiologist if after 30 to 45 minutes palpitations not better to come to ER

## 2018-01-17 NOTE — ED Notes (Signed)
Pt placed in gown and hooked to monitor. EKG performed.

## 2018-01-17 NOTE — ED Triage Notes (Addendum)
Pt to ED via EMS from [place] c/o left chest pain while showering.  Denies SOB, dizziness, and denies pain at this time.  Pt presents A&Ox4 speaking in complete and coherent sentences, chest rise even and unlabored, skin warm and dry.  Given 324 ASA via EMS, also states took 1 pill at facility "for fast heart rate" but no identifier on the pills.

## 2018-01-17 NOTE — ED Notes (Signed)
Dr, Quentin Cornwall at bedside

## 2018-01-17 NOTE — ED Notes (Signed)

## 2018-01-17 NOTE — ED Provider Notes (Signed)
[facility] Emergency Department Provider Note    First MD Initiated Contact with Patient 01/17/18 1001     (approximate)  I have reviewed the triage vital signs and the nursing notes.   HISTORY  Chief Complaint Chest Pain    HPI Peggy [name] is a 84 y.o. female is very pleasant with a history of A. fib on Coumadin presents with brief episode of midsternal chest pain radiating to her left shoulder that occurred after the patient got out of the shower.  Stated it lasted less than a few minutes.  There is no associated diaphoresis or shortness of breath.  States that she did feel gassy over the past day and burp with improvement in her symptoms.  Denies any abdominal pain.  No dysuria.  No pain with taking deep inspiration.  No lower extremity swelling.  She is asymptomatic and pain-free at this time.  Denies any palpitations but did take a as needed metoprolol this morning.  Past Medical History:  Diagnosis Date  . Allergy   . Anginal pain ([place])    c/o angina on May 04, 2016 releived by Nitroglycerin X 2  . Anxiety   . Arthritis   . Atrial fibrillation ([place])   . Atrial fibrillation ([place])   . Atrial fibrillation with rapid ventricular response [facility]) November 2015   Heart rate up to 140 requiring IV Cardizem  . Breast cancer [facility]) 2012   Patient underwent a left mastectomy on 03-31-11. Pathology showed DCIS at both sites without an invasive component. Sentinel nodes were negative. The breast lesions were noted to be ER +40%, PR-positive, 5%.   . Breast screening, unspecified   . Coronary artery disease   . Diverticulosis   . Dysrhythmia   . Essential hypertension, benign   . GERD (gastroesophageal reflux disease)   . Hearing disorder 2012   hearing aids  . Heart attack ([place]) 2001  . Heart disease 2001  . Hip pain   . Hyperlipidemia   . Hypertension 1996  . Lump or mass in breast   . Malignant neoplasm of upper-inner quadrant of female breast ([place])    . Malignant neoplasm of upper-outer quadrant of female breast ([place])   . Other benign neoplasm of connective and other soft tissue of thorax   . Personal history of malignant neoplasm of breast   . Reflux    acid reflux  . Shortness of breath dyspnea    with exertion  . Special screening for malignant neoplasms, colon   . Stroke [facility]) 2009  . Unspecified essential hypertension    Family History  Problem Relation Age of Onset  . Heart disease Mother   . Stroke Father   . Heart disease Father   . Breast cancer Sister        x 2, in their 3's  . Lung cancer Brother   . Colon cancer Neg Hx   . Ovarian cancer Neg Hx    Past Surgical History:  Procedure Laterality Date  . ABDOMINAL HYSTERECTOMY     age 89  . APPENDECTOMY     age 54  . BREAST BIOPSY     left breast biopsy over 15 years ago   . CHOLECYSTECTOMY     40 years ago   . COLON SURGERY     Dr. Tamala Julian, [facility]  . COLONOSCOPY  2009   [facility] Dr. Donnella Sham  . EXCISION OF BACK LESION Left 05/09/2016   Procedure: EXCISION OF GLUTEAL MASS;  Surgeon: Robert Bellow, MD;  Location: [facility] ORS;  Service: General;  Laterality: Left;  . EYE SURGERY Bilateral    Cataract Extraction with IOL  . HERNIA REPAIR    . JOINT REPLACEMENT Right    TOTAL HIP REPLACEMENT  . MASTECTOMY  2012   left breast  . SMALL INTESTINE SURGERY  12/31/2012   Abdominal exploration, lysis of adhesions for distal small bowel obstruction. Rochel Brome, MD  . TONSILLECTOMY     as a child  . TOTAL HIP ARTHROPLASTY Right 02/27/2016   Procedure: TOTAL HIP ARTHROPLASTY;  Surgeon: Dereck Leep, MD;  Location: [facility] ORS;  Service: Orthopedics;  Laterality: Right;  . UPPER GI ENDOSCOPY  2009   [facility] Dr. Donnella Sham   Patient Active Problem List   Diagnosis Date Noted  . Rectal bleed 09/01/2017  . Atrial flutter ([place]) 12/08/2016  . S/P total hip arthroplasty 02/27/2016  . Hip pain 01/15/2016  . Memory loss 03/21/2015  . Abnormality of gait 03/21/2015  . Atrial  fibrillation with RVR ([place]) 03/11/2015  . H/O: CVA (cerebrovascular accident) 03/11/2015  . Right temporal lobe infarction 04/10/2014  . Blurred vision 09/05/2013  . 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 EPIGASTRIC PAIN 02/14/2009  . NONSPECIFIC ABN FINDNG RAD&OTH EXAM BILARY TRCT 02/14/2009      Prior to Admission medications   Medication Sig Start Date End Date Taking? Authorizing Provider  acetaminophen (TYLENOL) 325 MG tablet Take 325 mg by mouth every 6 (six) hours as needed for mild pain.    Yes [provider]  ALPRAZolam (XANAX) 0.25 MG tablet Take 0.25 mg by mouth at bedtime as needed for anxiety or sleep.    Yes [provider]  aspirin EC 81 MG tablet Take 81 mg by mouth daily.   Yes [provider]  Calcium Carbonate-Vitamin D (RA CALCIUM PLUS VITAMIN D) 600-400 MG-UNIT tablet Take 1 tablet by mouth 2 (two) times daily.   Yes [provider]  diltiazem (CARDIZEM CD) 240 MG 24 hr capsule Take 1 capsule (240 mg total) by mouth daily. 12/12/16  Yes Gladstone Lighter, MD  docusate sodium (COLACE) 100 MG capsule Take 100 mg by mouth daily as needed for mild constipation.   Yes [provider]  escitalopram (LEXAPRO) 10 MG tablet Take 10 mg by mouth at bedtime.    Yes [provider]  fexofenadine (ALLEGRA) 180 MG tablet Take 180 mg by mouth as needed for allergies or rhinitis.   Yes [provider]  Menthol, Topical Analgesic, (BENGAY VANISHING SCENT) 2.5 % GEL Apply topically as needed (for pain).    Yes [provider]  metoprolol tartrate (LOPRESSOR) 25 MG tablet Take 25 mg by mouth 2 (two) times daily. May take additional 25 mg if needed for heart rate.   Yes [provider]  mirtazapine (REMERON) 15 MG tablet Take 15 mg by mouth at bedtime.   Yes [provider]  Multiple Vitamins-Minerals (PRESERVISION AREDS 2 PO) Take 1  tablet by mouth 2 (two) times daily.   Yes [provider]  nitroGLYCERIN (NITROSTAT) 0.4 MG SL tablet Place 0.4 mg under the tongue every 5 (five) minutes as needed for chest pain.    Yes [provider]  Probiotic Product (PROBIOTIC DAILY PO) Take 1 tablet by mouth daily.    Yes [provider]  RABEprazole (ACIPHEX) 20 MG tablet Take 1 tablet (20 mg total) by mouth 2 (  two) times daily. 12/11/16  Yes Gladstone Lighter, MD  traMADol (ULTRAM) 50 MG tablet Take 1-2 tablets (50-100 mg total) by mouth every 4 (four) hours as needed for moderate pain. 02/28/16  Yes Watt Climes, PA  vitamin B-12 (CYANOCOBALAMIN) 1000 MCG tablet Take 1,000 mcg by mouth at bedtime.    Yes [provider]  warfarin (COUMADIN) 3 MG tablet Take 3.5 mg by mouth at bedtime.  01/12/18  Yes [provider]  Wheat Dextrin (BENEFIBER DRINK MIX PO) Take 1 Dose by mouth every other day.   Yes [provider]  polyethylene glycol (MIRALAX / GLYCOLAX) packet Take 17 g by mouth every other day. Patient not taking: Reported on 01/17/2018 12/11/16   Gladstone Lighter, MD  sucralfate (CARAFATE) 1 GM/10ML suspension Take 10 mLs (1 g total) by mouth every 6 (six) hours. Patient not taking: Reported on 09/01/2017 12/11/16   Gladstone Lighter, MD    Allergies Alendronate sodium; Amoxicillin-pot clavulanate; Etodolac; Ibandronate sodium; Iodine; Lactose intolerance (gi); Minocycline hcl; Risedronate sodium; Sulfonamide derivatives; and Zoledronic acid    Social History Social History   Tobacco Use  . Smoking status: Never Smoker  . Smokeless tobacco: Never Used  Substance Use Topics  . Alcohol use: No    Alcohol/week: 0.0 oz  . Drug use: No    Review of Systems Patient denies headaches, rhinorrhea, blurry vision, numbness, shortness of breath, chest pain, edema, cough, abdominal pain, nausea, vomiting, diarrhea, dysuria, fevers, rashes or hallucinations unless otherwise stated  above in HPI. ____________________________________________   PHYSICAL EXAM:  VITAL SIGNS: Vitals:   01/17/18 1301 01/17/18 1326  BP: 132/72 131/76  Pulse: 64 61  Resp: 18 17  Temp:    SpO2: 96% 96%    Constitutional: Alert and oriented. Well appearing and in no acute distress. Eyes: Conjunctivae are normal.  Head: Atraumatic. Nose: No congestion/rhinnorhea. Mouth/Throat: Mucous membranes are moist.   Neck: No stridor. Painless ROM.  Cardiovascular: Normal rate, regular rhythm. Grossly normal heart sounds.  Good peripheral circulation. Respiratory: Normal respiratory effort.  No retractions. Lungs CTAB. Gastrointestinal: Soft and nontender. No distention. No abdominal bruits. No CVA tenderness. Genitourinary:  Musculoskeletal: No lower extremity tenderness nor edema.  No joint effusions. Neurologic:  Normal speech and language. No gross focal neurologic deficits are appreciated. No facial droop Skin:  Skin is warm, dry and intact. No rash noted. Psychiatric: Mood and affect are normal. Speech and behavior are normal.  ____________________________________________   LABS (all labs ordered are listed, but only abnormal results are displayed)  Results for orders placed or performed during the hospital encounter of 01/17/18 (from the past 24 hour(s))  Basic metabolic panel     Status: Abnormal   Collection Time: 01/17/18  9:55 AM  Result Value Ref Range   Sodium 137 135 - 145 mmol/L   Potassium 3.9 3.5 - 5.1 mmol/L   Chloride 105 101 - 111 mmol/L   CO2 23 22 - 32 mmol/L   Glucose, Bld 181 (H) 65 - 99 mg/dL   BUN 17 6 - 20 mg/dL   Creatinine, Ser 1.13 (H) 0.44 - 1.00 mg/dL   Calcium 9.1 8.9 - 10.3 mg/dL   GFR calc non Af Amer 42 (L) >60 mL/min   GFR calc Af Amer 49 (L) >60 mL/min   Anion gap 9 5 - 15  CBC     Status: None   Collection Time: 01/17/18  9:55 AM  Result Value Ref Range   WBC 6.4 3.6 -  11.0 K/uL   RBC 3.93 3.80 - 5.20 MIL/uL   Hemoglobin 12.0 12.0 - 16.0  g/dL   HCT 36.2 35.0 - 47.0 %   MCV 92.0 80.0 - 100.0 fL   MCH 30.6 26.0 - 34.0 pg   MCHC 33.3 32.0 - 36.0 g/dL   RDW 14.4 11.5 - 14.5 %   Platelets 284 150 - 440 K/uL  Troponin I     Status: None   Collection Time: 01/17/18  9:55 AM  Result Value Ref Range   Troponin I <0.03 <0.03 ng/mL  Protime-INR (order if Patient is taking Coumadin / Warfarin)     Status: Abnormal   Collection Time: 01/17/18  9:55 AM  Result Value Ref Range   Prothrombin Time 25.6 (H) 11.4 - 15.2 seconds   INR 2.36   Troponin I     Status: None   Collection Time: 01/17/18 12:34 PM  Result Value Ref Range   Troponin I <0.03 <0.03 ng/mL   ____________________________________________  EKG My review and personal interpretation at Time: 9:51   Indication: chest pain  Rate: 90  Rhythm: sinus Axis: normal Other: normal intervals, poor r wave progression, no stemi ____________________________________________  RADIOLOGY  I personally reviewed all radiographic images ordered to evaluate for the above acute complaints and reviewed radiology reports and findings.  These findings were personally discussed with the patient.  Please see medical record for radiology report.  ____________________________________________   PROCEDURES  Procedure(s) performed:  Procedures    Critical Care performed: no ____________________________________________   INITIAL IMPRESSION / ASSESSMENT AND PLAN / ED COURSE  Pertinent labs & imaging results that were available during my care of the patient were reviewed by me and considered in my medical decision making (see chart for details).  DDX: ACS, pericarditis, esophagitis, boerhaaves, pe, dissection, pna, bronchitis, costochondritis   Peggy [name] is a 84 y.o. who presents to the ED with symptoms as described above.  Patient pleasant and well-appearing.  EKG shows nonspecific changes and no evidence of acute MI.  Symptoms seem very atypical for ACS.  Will further stratify  with serial enzymes.  Her abdominal exam is soft and benign.  No evidence of pneumothorax or consolidation.  Not consistent with pulmonary embolism given stable vital signs and patient appropriately anticoagulated on Coumadin.  The patient will be placed on continuous pulse oximetry and telemetry for monitoring.  Laboratory evaluation will be sent to evaluate for the above complaints.       Patient remains [place] dynamically stable.  No dysrhythmia or ectopy on monitor.  Repeat troponin is negative.  As she is asymptomatic and denies any other symptoms at this time I do believe patient is stable and appropriate for outpatient follow-up.  Have discussed with the patient and available family all diagnostics and treatments performed thus far and all questions were answered to the best of my ability. The patient demonstrates understanding and agreement with plan.   ____________________________________________   FINAL CLINICAL IMPRESSION(S) / ED DIAGNOSES  Final diagnoses:  Chest pain, unspecified type      NEW MEDICATIONS STARTED DURING THIS VISIT:  Discharge Medication List as of 01/17/2018  1:29 PM       Note:  This document was prepared using Dragon voice recognition software and may include unintentional dictation errors.    Merlyn Lot, MD 01/17/18 [phone]

## 2018-02-14 ENCOUNTER — Encounter: Payer: Self-pay | Admitting: Emergency Medicine

## 2018-02-14 ENCOUNTER — Observation Stay
Admission: EM | Admit: 2018-02-14 | Discharge: 2018-02-15 | Disposition: A | Discharge status: Home / Self Care | Payer: Medicare Other | Attending: Internal Medicine | Admitting: Internal Medicine

## 2018-02-14 ENCOUNTER — Emergency Department: Payer: Medicare Other

## 2018-02-14 ENCOUNTER — Other Ambulatory Visit: Payer: Self-pay

## 2018-02-14 DIAGNOSIS — I25119 Atherosclerotic heart disease of native coronary artery with unspecified angina pectoris: Secondary | ICD-10-CM | POA: Insufficient documentation

## 2018-02-14 DIAGNOSIS — R079 Chest pain, unspecified: Secondary | ICD-10-CM | POA: Diagnosis present

## 2018-02-14 DIAGNOSIS — I4891 Unspecified atrial fibrillation: Principal | ICD-10-CM | POA: Insufficient documentation

## 2018-02-14 DIAGNOSIS — K219 Gastro-esophageal reflux disease without esophagitis: Secondary | ICD-10-CM | POA: Diagnosis not present

## 2018-02-14 DIAGNOSIS — I10 Essential (primary) hypertension: Secondary | ICD-10-CM | POA: Insufficient documentation

## 2018-02-14 DIAGNOSIS — Z881 Allergy status to other antibiotic agents status: Secondary | ICD-10-CM | POA: Insufficient documentation

## 2018-02-14 DIAGNOSIS — R0600 Dyspnea, unspecified: Secondary | ICD-10-CM | POA: Diagnosis not present

## 2018-02-14 DIAGNOSIS — Z7901 Long term (current) use of anticoagulants: Secondary | ICD-10-CM | POA: Insufficient documentation

## 2018-02-14 DIAGNOSIS — Z96641 Presence of right artificial hip joint: Secondary | ICD-10-CM | POA: Diagnosis not present

## 2018-02-14 DIAGNOSIS — Z882 Allergy status to sulfonamides status: Secondary | ICD-10-CM | POA: Insufficient documentation

## 2018-02-14 DIAGNOSIS — Z8673 Personal history of transient ischemic attack (TIA), and cerebral infarction without residual deficits: Secondary | ICD-10-CM | POA: Diagnosis not present

## 2018-02-14 DIAGNOSIS — Z853 Personal history of malignant neoplasm of breast: Secondary | ICD-10-CM | POA: Diagnosis not present

## 2018-02-14 DIAGNOSIS — H538 Other visual disturbances: Secondary | ICD-10-CM | POA: Diagnosis not present

## 2018-02-14 DIAGNOSIS — E785 Hyperlipidemia, unspecified: Secondary | ICD-10-CM | POA: Diagnosis not present

## 2018-02-14 DIAGNOSIS — Z7982 Long term (current) use of aspirin: Secondary | ICD-10-CM | POA: Insufficient documentation

## 2018-02-14 DIAGNOSIS — K5790 Diverticulosis of intestine, part unspecified, without perforation or abscess without bleeding: Secondary | ICD-10-CM | POA: Diagnosis not present

## 2018-02-14 DIAGNOSIS — M1990 Unspecified osteoarthritis, unspecified site: Secondary | ICD-10-CM | POA: Diagnosis not present

## 2018-02-14 DIAGNOSIS — I252 Old myocardial infarction: Secondary | ICD-10-CM | POA: Insufficient documentation

## 2018-02-14 DIAGNOSIS — Z9049 Acquired absence of other specified parts of digestive tract: Secondary | ICD-10-CM | POA: Insufficient documentation

## 2018-02-14 DIAGNOSIS — Z79899 Other long term (current) drug therapy: Secondary | ICD-10-CM | POA: Insufficient documentation

## 2018-02-14 DIAGNOSIS — F419 Anxiety disorder, unspecified: Secondary | ICD-10-CM | POA: Insufficient documentation

## 2018-02-14 DIAGNOSIS — H9193 Unspecified hearing loss, bilateral: Secondary | ICD-10-CM | POA: Diagnosis not present

## 2018-02-14 DIAGNOSIS — R269 Unspecified abnormalities of gait and mobility: Secondary | ICD-10-CM | POA: Insufficient documentation

## 2018-02-14 DIAGNOSIS — K625 Hemorrhage of anus and rectum: Secondary | ICD-10-CM | POA: Diagnosis not present

## 2018-02-14 DIAGNOSIS — Z888 Allergy status to other drugs, medicaments and biological substances status: Secondary | ICD-10-CM | POA: Insufficient documentation

## 2018-02-14 DIAGNOSIS — E739 Lactose intolerance, unspecified: Secondary | ICD-10-CM | POA: Insufficient documentation

## 2018-02-14 DIAGNOSIS — Z90710 Acquired absence of both cervix and uterus: Secondary | ICD-10-CM | POA: Diagnosis not present

## 2018-02-14 DIAGNOSIS — I4892 Unspecified atrial flutter: Secondary | ICD-10-CM | POA: Diagnosis present

## 2018-02-14 LAB — BASIC METABOLIC PANEL
ANION GAP: 10 (ref 5–15)
BUN: 17 mg/dL (ref 6–20)
CALCIUM: 8.7 mg/dL — AB (ref 8.9–10.3)
CO2: 22 mmol/L (ref 22–32)
Chloride: 105 mmol/L (ref 101–111)
Creatinine, Ser: 1.24 mg/dL — ABNORMAL HIGH (ref 0.44–1.00)
GFR, EST AFRICAN AMERICAN: 44 mL/min — AB (ref >60)
GFR, EST NON AFRICAN AMERICAN: 38 mL/min — AB (ref >60)
Glucose, Bld: 164 mg/dL — ABNORMAL HIGH (ref 65–99)
Potassium: 4.5 mmol/L (ref 3.5–5.1)
Sodium: 137 mmol/L (ref 135–145)

## 2018-02-14 LAB — CBC WITH DIFFERENTIAL/PLATELET
Basophils Absolute: 0 10*3/uL (ref 0–0.1)
Basophils Relative: 1 %
EOS ABS: 0.2 10*3/uL (ref 0–0.7)
Eosinophils Relative: 2 %
HEMATOCRIT: 36.6 % (ref 35.0–47.0)
HEMOGLOBIN: 12.3 g/dL (ref 12.0–16.0)
LYMPHS ABS: 2 10*3/uL (ref 1.0–3.6)
Lymphocytes Relative: 22 %
MCH: 30.4 pg (ref 26.0–34.0)
MCHC: 33.6 g/dL (ref 32.0–36.0)
MCV: 90.7 fL (ref 80.0–100.0)
MONOS PCT: 12 %
Monocytes Absolute: 1.1 10*3/uL — ABNORMAL HIGH (ref 0.2–0.9)
NEUTROS ABS: 5.8 10*3/uL (ref 1.4–6.5)
NEUTROS PCT: 63 %
Platelets: 275 10*3/uL (ref 150–440)
RBC: 4.03 MIL/uL (ref 3.80–5.20)
RDW: 14.2 % (ref 11.5–14.5)
WBC: 9.1 10*3/uL (ref 3.6–11.0)

## 2018-02-14 LAB — TROPONIN I
Troponin I: <0.03 ng/mL (ref <0.03)
Troponin I: <0.03 ng/mL (ref <0.03)

## 2018-02-14 LAB — BRAIN NATRIURETIC PEPTIDE: B Natriuretic Peptide: 166 pg/mL — ABNORMAL HIGH (ref 0.0–100.0)

## 2018-02-14 LAB — PROTIME-INR
INR: 2.51
PROTHROMBIN TIME: 26.9 s — AB (ref 11.4–15.2)

## 2018-02-14 LAB — MRSA PCR SCREENING: MRSA by PCR: NEGATIVE

## 2018-02-14 MED ORDER — RISAQUAD PO CAPS
1.0000 | ORAL_CAPSULE | Freq: Every day | ORAL | Status: DC
  Administered 2018-02-15: 1 via ORAL
  Filled 2018-02-14: qty 1

## 2018-02-14 MED ORDER — METOPROLOL TARTRATE 25 MG PO TABS
25.0000 mg | ORAL_TABLET | Freq: Two times a day (BID) | ORAL | Status: DC
  Administered 2018-02-14 – 2018-02-15 (×2): 25 mg via ORAL
  Filled 2018-02-14 (×2): qty 1

## 2018-02-14 MED ORDER — NITROGLYCERIN 0.4 MG SL SUBL: 0.4000 mg | SUBLINGUAL_TABLET | SUBLINGUAL | Status: DC | PRN

## 2018-02-14 MED ORDER — LORATADINE 10 MG PO TABS
10.0000 mg | ORAL_TABLET | Freq: Every day | ORAL | Status: DC
  Administered 2018-02-15: 10 mg via ORAL
  Filled 2018-02-14: qty 1

## 2018-02-14 MED ORDER — CALCIUM CARBONATE-VITAMIN D 500-200 MG-UNIT PO TABS
1.0000 | ORAL_TABLET | Freq: Two times a day (BID) | ORAL | Status: DC
  Administered 2018-02-14 – 2018-02-15 (×2): 1 via ORAL
  Filled 2018-02-14 (×2): qty 1

## 2018-02-14 MED ORDER — ONDANSETRON HCL 4 MG/2ML IJ SOLN: 4.0000 mg | Freq: Four times a day (QID) | INTRAMUSCULAR | Status: DC | PRN

## 2018-02-14 MED ORDER — ASPIRIN EC 81 MG PO TBEC
81.0000 mg | DELAYED_RELEASE_TABLET | Freq: Every day | ORAL | Status: DC
  Administered 2018-02-15: 81 mg via ORAL
  Filled 2018-02-14: qty 1

## 2018-02-14 MED ORDER — METOPROLOL TARTRATE 5 MG/5ML IV SOLN
2.5000 mg | Freq: Once | INTRAVENOUS | Status: AC
  Administered 2018-02-14: 2.5 mg via INTRAVENOUS
  Filled 2018-02-14: qty 5

## 2018-02-14 MED ORDER — SODIUM CHLORIDE 0.9 % IV BOLUS (SEPSIS)
500.0000 mL | Freq: Once | INTRAVENOUS | Status: AC
Start: 2018-02-14 — End: 2018-02-14
  Administered 2018-02-14: 500 mL via INTRAVENOUS

## 2018-02-14 MED ORDER — WARFARIN - PHYSICIAN DOSING INPATIENT: Freq: Every day | Status: DC

## 2018-02-14 MED ORDER — ESCITALOPRAM OXALATE 10 MG PO TABS
10.0000 mg | ORAL_TABLET | Freq: Every day | ORAL | Status: DC
  Administered 2018-02-14: 10 mg via ORAL
  Filled 2018-02-14: qty 1

## 2018-02-14 MED ORDER — GI COCKTAIL ~~LOC~~
30.0000 mL | Freq: Four times a day (QID) | ORAL | Status: DC | PRN
  Filled 2018-02-14: qty 30

## 2018-02-14 MED ORDER — MORPHINE SULFATE (PF) 2 MG/ML IV SOLN: 2.0000 mg | INTRAVENOUS | Status: DC | PRN

## 2018-02-14 MED ORDER — DOCUSATE SODIUM 100 MG PO CAPS: 100.0000 mg | ORAL_CAPSULE | Freq: Every day | ORAL | Status: DC | PRN

## 2018-02-14 MED ORDER — DILTIAZEM HCL ER COATED BEADS 120 MG PO CP24
240.0000 mg | ORAL_CAPSULE | Freq: Every day | ORAL | Status: DC
  Administered 2018-02-15: 240 mg via ORAL
  Filled 2018-02-14: qty 2

## 2018-02-14 MED ORDER — ACETAMINOPHEN 325 MG PO TABS
650.0000 mg | ORAL_TABLET | ORAL | Status: DC | PRN
  Administered 2018-02-14: 650 mg via ORAL
  Filled 2018-02-14: qty 2

## 2018-02-14 MED ORDER — ALPRAZOLAM 0.25 MG PO TABS
0.2500 mg | ORAL_TABLET | Freq: Every evening | ORAL | Status: DC | PRN
  Administered 2018-02-14: 0.25 mg via ORAL
  Filled 2018-02-14: qty 1

## 2018-02-14 MED ORDER — WARFARIN SODIUM 3 MG PO TABS
3.5000 mg | ORAL_TABLET | Freq: Every day | ORAL | Status: DC
  Administered 2018-02-14: 3.5 mg via ORAL
  Filled 2018-02-14: qty 1

## 2018-02-14 MED ORDER — DILTIAZEM HCL 100 MG IV SOLR
5.0000 mg/h | Freq: Once | INTRAVENOUS | Status: AC
  Administered 2018-02-14: 5 mg/h via INTRAVENOUS
  Filled 2018-02-14: qty 100

## 2018-02-14 MED ORDER — PANTOPRAZOLE SODIUM 40 MG PO TBEC
40.0000 mg | DELAYED_RELEASE_TABLET | Freq: Every day | ORAL | Status: DC
  Administered 2018-02-15: 40 mg via ORAL
  Filled 2018-02-14: qty 1

## 2018-02-14 NOTE — Progress Notes (Signed)
Admission complete. Pnt and family oriented to room and how to control bed, lights and use of call bell. Pnt ambulated unit x1 tol. well. Pnt appeared tired afterwards and returned to bed. No other issues or concerns at this time. Pnt remains in NSR per telemetry. Bed low, locked and call bell in reach. Will continue to monitor and assess.

## 2018-02-14 NOTE — ED Triage Notes (Signed)
Here for chest discomfort. 1 episode vomiting. On blood thinner.  Took home NTG X 3 and ASA at home; pain free after.  Hypotensive when EMS arrived but has improved.

## 2018-02-14 NOTE — H&P (Signed)
[facility] NAME: Peggy [name]    MR#:  [id number]  DATE OF BIRTH:  12-14-1928  DATE OF ADMISSION:  02/14/2018  PRIMARY CARE PHYSICIAN: Maryland Pink, MD   REQUESTING/REFERRING PHYSICIAN:   CHIEF COMPLAINT:   Chief Complaint  Patient presents with  . Chest Pain    HISTORY OF PRESENT ILLNESS: Peggy [name]  is a 84 y.o. female with a known history per below, status post stress testing in September 2018 by Dr. Fath/cardiology-was abnormal, cardiology was reluctant to do heart catheterization given advanced age/poor clinical health, patient presents with acute chest pain this morning that was made better with nitroglycerin/aspirin, was associated with emesis, noted A. fib with RVR in the emergency room-patient converted to normal sinus rhythm on Cardizem drip, ER workup was unimpressive, INR 2.5, chest x-ray was negative, noted echocardiogram from December 2017 was normal ejection fraction, patient evaluated in the emergency room, family present, patient in no apparent distress, patient is now being admitted for acute A. fib with RVR and acute recurrent angina with history of coronary artery disease/abnormal in September 2018.  PAST MEDICAL HISTORY:   Past Medical History:  Diagnosis Date  . Allergy   . Anginal pain ([place])    c/o angina on May 04, 2016 releived by Nitroglycerin X 2  . Anxiety   . Arthritis   . Atrial fibrillation ([place])   . Atrial fibrillation ([place])   . Atrial fibrillation with rapid ventricular response [facility]) November 2015   Heart rate up to 140 requiring IV Cardizem  . Breast cancer [facility]) 2012   Patient underwent a left mastectomy on 03-31-11. Pathology showed DCIS at both sites without an invasive component. Sentinel nodes were negative. The breast lesions were noted to be ER +40%, PR-positive, 5%.   . Breast screening, unspecified   . Coronary artery disease   . Diverticulosis   . Dysrhythmia   . Essential  hypertension, benign   . GERD (gastroesophageal reflux disease)   . Hearing disorder 2012   hearing aids  . Heart attack ([place]) 2001  . Heart disease 2001  . Hip pain   . Hyperlipidemia   . Hypertension 1996  . Lump or mass in breast   . Malignant neoplasm of upper-inner quadrant of female breast ([place])   . Malignant neoplasm of upper-outer quadrant of female breast ([place])   . Other benign neoplasm of connective and other soft tissue of thorax   . Personal history of malignant neoplasm of breast   . Reflux    acid reflux  . Shortness of breath dyspnea    with exertion  . Special screening for malignant neoplasms, colon   . Stroke [facility]) 2009  . Unspecified essential hypertension     PAST SURGICAL HISTORY:  Past Surgical History:  Procedure Laterality Date  . ABDOMINAL HYSTERECTOMY     age 79  . APPENDECTOMY     age 58  . BREAST BIOPSY     left breast biopsy over 15 years ago   . CHOLECYSTECTOMY     40 years ago   . COLON SURGERY     Dr. Tamala Julian, [facility]  . COLONOSCOPY  2009   [facility] Dr. Donnella Sham  . EXCISION OF BACK LESION Left 05/09/2016   Procedure: EXCISION OF GLUTEAL MASS;  Surgeon: Robert Bellow, MD;  Location: [facility] ORS;  Service: General;  Laterality: Left;  . EYE SURGERY Bilateral    Cataract Extraction with IOL  . HERNIA  REPAIR    . JOINT REPLACEMENT Right    TOTAL HIP REPLACEMENT  . MASTECTOMY  2012   left breast  . SMALL INTESTINE SURGERY  12/31/2012   Abdominal exploration, lysis of adhesions for distal small bowel obstruction. Rochel Brome, MD  . TONSILLECTOMY     as a child  . TOTAL HIP ARTHROPLASTY Right 02/27/2016   Procedure: TOTAL HIP ARTHROPLASTY;  Surgeon: Dereck Leep, MD;  Location: [facility] ORS;  Service: Orthopedics;  Laterality: Right;  . UPPER GI ENDOSCOPY  2009   [facility] Dr. Donnella Sham    SOCIAL HISTORY:  Social History   Tobacco Use  . Smoking status: Never Smoker  . Smokeless tobacco: Never Used  Substance Use Topics  . Alcohol use: No     Alcohol/week: 0.0 oz    FAMILY HISTORY:  Family History  Problem Relation Age of Onset  . Heart disease Mother   . Stroke Father   . Heart disease Father   . Breast cancer Sister        x 2, in their 60's  . Lung cancer Brother   . Colon cancer Neg Hx   . Ovarian cancer Neg Hx     DRUG ALLERGIES:  Allergies  Allergen Reactions  . Alendronate Sodium     REACTION: questionable  . Amoxicillin-Pot Clavulanate   . Etodolac   . Ibandronate Sodium     REACTION: questionable  . Iodine     Other reaction(s): Unknown  . Lactose Intolerance (Gi) Other (See Comments)    GI problems  . Minocycline Hcl   . Risedronate Sodium   . Sulfonamide Derivatives Other (See Comments)    Stomach pain  . Zoledronic Acid     REACTION: "dental problems"    REVIEW OF SYSTEMS:   CONSTITUTIONAL: No fever, +fatigue/weakness.  EYES: No blurred or double vision.  EARS, NOSE, AND THROAT: No tinnitus or ear pain.  RESPIRATORY: No cough, shortness of breath, wheezing or hemoptysis.  CARDIOVASCULAR: +chest pain, no orthopnea, edema.  GASTROINTESTINAL: No nausea, vomiting, diarrhea or abdominal pain.  GENITOURINARY: No dysuria, hematuria.  ENDOCRINE: No polyuria, nocturia,  HEMATOLOGY: No anemia, easy bruising or bleeding SKIN: No rash or lesion. MUSCULOSKELETAL: No joint pain or arthritis.   NEUROLOGIC: No tingling, numbness, weakness.  PSYCHIATRY: No anxiety or depression.   MEDICATIONS AT HOME:  Prior to Admission medications   Medication Sig Start Date End Date Taking? Authorizing Provider  ALPRAZolam Duanne Moron) 0.25 MG tablet Take 0.25 mg by mouth at bedtime as needed for anxiety or sleep.    Yes [provider]  aspirin EC 81 MG tablet Take 81 mg by mouth daily.   Yes [provider]  Calcium Carbonate-Vitamin D (RA CALCIUM PLUS VITAMIN D) 600-400 MG-UNIT tablet Take 1 tablet by mouth 2 (two) times daily.   Yes [provider]  diltiazem (CARDIZEM CD) 240 MG 24 hr  capsule Take 1 capsule (240 mg total) by mouth daily. 12/12/16  Yes Gladstone Lighter, MD  escitalopram (LEXAPRO) 10 MG tablet Take 10 mg by mouth at bedtime.    Yes [provider]  metoprolol tartrate (LOPRESSOR) 25 MG tablet Take 25 mg by mouth 2 (two) times daily. May take additional 25 mg if needed for heart rate.   Yes [provider]  mirtazapine (REMERON) 15 MG tablet Take 15 mg by mouth at bedtime.   Yes [provider]  Multiple Vitamins-Minerals (PRESERVISION AREDS 2 PO) Take 1 tablet by mouth 2 (two) times daily.  Yes [provider]  Probiotic Product (PROBIOTIC DAILY PO) Take 1 tablet by mouth daily.    Yes [provider]  RABEprazole (ACIPHEX) 20 MG tablet Take 1 tablet (20 mg total) by mouth 2 (two) times daily. 12/11/16  Yes Gladstone Lighter, MD  vitamin B-12 (CYANOCOBALAMIN) 1000 MCG tablet Take 1,000 mcg by mouth at bedtime.    Yes [provider]  warfarin (COUMADIN) 3 MG tablet Take 3.5 mg by mouth at bedtime.  01/12/18  Yes [provider]  Wheat Dextrin (BENEFIBER DRINK MIX PO) Take 1 Dose by mouth every other day.   Yes [provider]  acetaminophen (TYLENOL) 325 MG tablet Take 325 mg by mouth every 6 (six) hours as needed for mild pain.     [provider]  docusate sodium (COLACE) 100 MG capsule Take 100 mg by mouth daily as needed for mild constipation.    [provider]  fexofenadine (ALLEGRA) 180 MG tablet Take 180 mg by mouth as needed for allergies or rhinitis.    [provider]  Menthol, Topical Analgesic, (BENGAY VANISHING SCENT) 2.5 % GEL Apply topically as needed (for pain).     [provider]  nitroGLYCERIN (NITROSTAT) 0.4 MG SL tablet Place 0.4 mg under the tongue every 5 (five) minutes as needed for chest pain.     [provider]  traMADol (ULTRAM) 50 MG tablet Take 1-2 tablets (50-100 mg total) by mouth every 4 (four) hours as needed for  moderate pain. 02/28/16   Watt Climes, PA      PHYSICAL EXAMINATION:   VITAL SIGNS: Blood pressure 126/79, pulse 87, temperature 99.3 F (37.4 C), temperature source Oral, resp. rate (!) 21, height 4\' 7"  (1.397 m), weight 51.7 kg (114 lb), SpO2 95 %.  GENERAL:  84 y.o.-year-old patient lying in the bed with no acute distress.  Frail-appearing EYES: Pupils equal, round, reactive to light and accommodation. No scleral icterus. Extraocular muscles intact.  HEENT: Head atraumatic, normocephalic. Oropharynx and nasopharynx clear.  NECK:  Supple, no jugular venous distention. No thyroid enlargement, no tenderness.  LUNGS: Normal breath sounds bilaterally, no wheezing, rales,rhonchi or crepitation. No use of accessory muscles of respiration.  CARDIOVASCULAR: S1, S2 normal. No murmurs, rubs, or gallops.  ABDOMEN: Soft, nontender, nondistended. Bowel sounds present. No organomegaly or mass.  EXTREMITIES: No pedal edema, cyanosis, or clubbing.  NEUROLOGIC: Cranial nerves II through XII are intact. Muscle strength 5/5 in all extremities. Sensation intact. Gait not checked.  PSYCHIATRIC: The patient is alert and oriented x 3.  SKIN: No obvious rash, lesion, or ulcer.   LABORATORY PANEL:   CBC Recent Labs  Lab 02/14/18 0953  WBC 9.1  HGB 12.3  HCT 36.6  PLT 275  MCV 90.7  MCH 30.4  MCHC 33.6  RDW 14.2  LYMPHSABS 2.0  MONOABS 1.1*  EOSABS 0.2  BASOSABS 0.0   ------------------------------------------------------------------------------------------------------------------  Chemistries  Recent Labs  Lab 02/14/18 0930  NA 137  K 4.5  CL 105  CO2 22  GLUCOSE 164*  BUN 17  CREATININE 1.24*  CALCIUM 8.7*   ------------------------------------------------------------------------------------------------------------------ estimated creatinine clearance is 20.7 mL/min (A) (by C-G formula based on SCr of 1.24 mg/dL  (H)). ------------------------------------------------------------------------------------------------------------------ No results for input(s): TSH, T4TOTAL, T3FREE, THYROIDAB in the last 72 hours.  Invalid input(s): FREET3   Coagulation profile Recent Labs  Lab 02/14/18 0933  INR 2.51   ------------------------------------------------------------------------------------------------------------------- No results for input(s): DDIMER in the last 72 hours. -------------------------------------------------------------------------------------------------------------------  Cardiac Enzymes  Recent Labs  Lab 02/14/18 0930  TROPONINI <0.03   ------------------------------------------------------------------------------------------------------------------ Invalid input(s): POCBNP  ---------------------------------------------------------------------------------------------------------------  Urinalysis    Component Value Date/Time   COLORURINE COLORLESS (A) 12/05/2016 1233   APPEARANCEUR CLEAR (A) 12/05/2016 1233   APPEARANCEUR Clear 10/23/2014 1605   LABSPEC 1.005 12/05/2016 1233   LABSPEC 1.011 10/23/2014 1605   PHURINE 7.0 12/05/2016 1233   GLUCOSEU NEGATIVE 12/05/2016 1233   GLUCOSEU Negative 10/23/2014 1605   HGBUR NEGATIVE 12/05/2016 1233   BILIRUBINUR NEGATIVE 12/05/2016 1233   BILIRUBINUR Negative 10/23/2014 1605   KETONESUR NEGATIVE 12/05/2016 1233   PROTEINUR NEGATIVE 12/05/2016 1233   UROBILINOGEN 0.2 03/11/2015 1430   NITRITE NEGATIVE 12/05/2016 1233   LEUKOCYTESUR NEGATIVE 12/05/2016 1233   LEUKOCYTESUR Negative 10/23/2014 1605     RADIOLOGY: Dg Chest 1 View  Result Date: 02/14/2018 CLINICAL DATA:  Chest discomfort. EXAM: CHEST 1 VIEW COMPARISON:  January 17, 2018 FINDINGS: The heart size and mediastinal contours are within normal limits. Both lungs are clear. The visualized skeletal structures are unremarkable. IMPRESSION: No active disease. Electronically  Signed   By: Dorise Bullion III M.D   On: 02/14/2018 09:52    EKG: Orders placed or performed during the hospital encounter of 02/14/18  . EKG 12-Lead  . EKG 12-Lead    IMPRESSION AND PLAN: 1 acute A. fib with RVR Resolved Converted to normal sinus rhythm on Cardizem drip Referred to the observation unit, rule out acute coronary syndrome with cardiac enzymes x3 sets, continue aspirin, on Coumadin-INR 2.5, Cardizem CD, Lopressor twice daily, PT/INR daily, cardiology consult for expert opinion, and continue close medical monitoring  2 acute recurrent angina Alleviated with nitro/aspirin Had abnormal stress testing done in September 2018 for which cardiology/Dr. Ubaldo Glassing did not recommend heart catheterization given frailty/advanced age per family Continue aspirin, on Coumadin-INR therapeutic, Lopressor, nitroglycerin as needed, IV morphine as needed breakthrough pain, supplemental oxygen as needed, cardiology to see  3 chronic GERD without esophagitis PPI daily  4 chronic benign essential hypertension Stable Continue home regiment  5 chronic anxiety/depression, unspecified Stable Continue home psychotropic regiment  Disposition home on tomorrow barring any complications    All the records are reviewed and case discussed with ED provider. Management plans discussed with the patient, family and they are in agreement.  CODE STATUS:full Code Status History    Date Active Date Inactive Code Status Order ID Comments User Context   09/01/2017 17:02 09/02/2017 16:25 Partial Code [id number]  Vaughan Basta, MD Inpatient   09/01/2017 15:44 09/01/2017 17:02 DNR [id number]  Vaughan Basta, MD Inpatient   12/08/2016 22:17 12/11/2016 14:50 Full Code [id number]  Gladstone Lighter, MD Inpatient   05/11/2016 23:00 05/12/2016 17:29 Full Code [id number]  Nicholes Mango, MD Inpatient   01/15/2016 11:34 01/16/2016 18:10 Full Code [id number]  Saundra Shelling, MD ED   03/11/2015 17:33 03/12/2015  18:42 Full Code [id number]  Jonetta Osgood, MD Inpatient    Questions for Most Recent Historical Code Status (Order [id number])    Question Answer Comment   In the event of cardiac or respiratory ARREST: Initiate Code Blue, Call Rapid Response Yes    In the event of cardiac or respiratory ARREST: Perform CPR Yes    In the event of cardiac or respiratory ARREST: Perform Intubation/Mechanical Ventilation No    In the event of cardiac or respiratory ARREST: Use NIPPV/BiPAp only if indicated Yes    In the event of cardiac or respiratory ARREST: Administer ACLS medications if indicated Yes    In the event  of cardiac or respiratory ARREST: Perform Defibrillation or Cardioversion if indicated Yes    Comments confirmed again, as pt and her daughter told clearly like this now.        TOTAL TIME TAKING CARE OF THIS PATIENT: 45 minutes.    Avel Peace Windsor Zirkelbach M.D on 02/14/2018   Between 7am to 6pm - Pager - [phone]  After 6pm go to [email or web address] - password [email or web address] [facility]  Office  [phone]  CC: Primary care physician; [place] Pink, MD   Note: This dictation was prepared with Dragon dictation along with smaller phrase technology. Any transcriptional errors that result from this process are unintentional.

## 2018-02-14 NOTE — ED Provider Notes (Signed)
[facility] Emergency Department Provider Note  ____________________________________________   First MD Initiated Contact with Patient 02/14/18 [phone]     (approximate)  I have reviewed the triage vital signs and the nursing notes.   HISTORY  Chief Complaint Chest Pain   HPI Peggy [name] is a 84 y.o. female  with a history of angina as well as atrial fibrillation on Cardizem who is presenting to the emergency department today with chest pain.  The patient says that the chest pain started several hours ago and was to the middle of the chest.  She is unable to give a number for the degree of pain out of 10.  She is unable to qualify the pain says it was just pain."  She says the pain was to the center of her chest and nonradiating.  She denies it being associate with shortness of breath with this she did vomit one time.  She said that she also was able to take 3 nitro and resolve the pain.  Patient is on Coumadin.  Says that she is continuing to be chest pain-free and symptom-free at this time.   Past Medical History:  Diagnosis Date  . Allergy   . Anginal pain ([place])    c/o angina on May 04, 2016 releived by Nitroglycerin X 2  . Anxiety   . Arthritis   . Atrial fibrillation ([place])   . Atrial fibrillation ([place])   . Atrial fibrillation with rapid ventricular response [facility]) November 2015   Heart rate up to 140 requiring IV Cardizem  . Breast cancer [facility]) 2012   Patient underwent a left mastectomy on 03-31-11. Pathology showed DCIS at both sites without an invasive component. Sentinel nodes were negative. The breast lesions were noted to be ER +40%, PR-positive, 5%.   . Breast screening, unspecified   . Coronary artery disease   . Diverticulosis   . Dysrhythmia   . Essential hypertension, benign   . GERD (gastroesophageal reflux disease)   . Hearing disorder 2012   hearing aids  . Heart attack ([place]) 2001  . Heart disease 2001  . Hip pain   .  Hyperlipidemia   . Hypertension 1996  . Lump or mass in breast   . Malignant neoplasm of upper-inner quadrant of female breast ([place])   . Malignant neoplasm of upper-outer quadrant of female breast ([place])   . Other benign neoplasm of connective and other soft tissue of thorax   . Personal history of malignant neoplasm of breast   . Reflux    acid reflux  . Shortness of breath dyspnea    with exertion  . Special screening for malignant neoplasms, colon   . Stroke [facility]) 2009  . Unspecified essential hypertension     Patient Active Problem List   Diagnosis Date Noted  . Rectal bleed 09/01/2017  . Atrial flutter ([place]) 12/08/2016  . S/P total hip arthroplasty 02/27/2016  . Hip pain 01/15/2016  . Memory loss 03/21/2015  . Abnormality of gait 03/21/2015  . Atrial fibrillation with RVR ([place]) 03/11/2015  . H/O: CVA (cerebrovascular accident) 03/11/2015  . Right temporal lobe infarction 04/10/2014  . Blurred vision 09/05/2013  . 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 EPIGASTRIC PAIN 02/14/2009  . NONSPECIFIC ABN FINDNG RAD&OTH EXAM BILARY TRCT 02/14/2009    Past Surgical History:  Procedure Laterality Date  . ABDOMINAL HYSTERECTOMY     age 73  . APPENDECTOMY  age 41  . BREAST BIOPSY     left breast biopsy over 15 years ago   . CHOLECYSTECTOMY     40 years ago   . COLON SURGERY     Dr. Tamala Julian, [facility]  . COLONOSCOPY  2009   [facility] Dr. Donnella Sham  . EXCISION OF BACK LESION Left 05/09/2016   Procedure: EXCISION OF GLUTEAL MASS;  Surgeon: Robert Bellow, MD;  Location: [facility] ORS;  Service: General;  Laterality: Left;  . EYE SURGERY Bilateral    Cataract Extraction with IOL  . HERNIA REPAIR    . JOINT REPLACEMENT Right    TOTAL HIP REPLACEMENT  . MASTECTOMY  2012   left breast  . SMALL INTESTINE SURGERY  12/31/2012   Abdominal exploration, lysis of adhesions for distal small bowel obstruction. Rochel Brome, MD  .  TONSILLECTOMY     as a child  . TOTAL HIP ARTHROPLASTY Right 02/27/2016   Procedure: TOTAL HIP ARTHROPLASTY;  Surgeon: Dereck Leep, MD;  Location: [facility] ORS;  Service: Orthopedics;  Laterality: Right;  . UPPER GI ENDOSCOPY  2009   [facility] Dr. Donnella Sham    Prior to Admission medications   Medication Sig Start Date End Date Taking? Authorizing Provider  ALPRAZolam Duanne Moron) 0.25 MG tablet Take 0.25 mg by mouth at bedtime as needed for anxiety or sleep.    Yes [provider]  aspirin EC 81 MG tablet Take 81 mg by mouth daily.   Yes [provider]  Calcium Carbonate-Vitamin D (RA CALCIUM PLUS VITAMIN D) 600-400 MG-UNIT tablet Take 1 tablet by mouth 2 (two) times daily.   Yes [provider]  diltiazem (CARDIZEM CD) 240 MG 24 hr capsule Take 1 capsule (240 mg total) by mouth daily. 12/12/16  Yes [place] Lighter, MD  escitalopram (LEXAPRO) 10 MG tablet Take 10 mg by mouth at bedtime.    Yes [provider]  metoprolol tartrate (LOPRESSOR) 25 MG tablet Take 25 mg by mouth 2 (two) times daily. May take additional 25 mg if needed for heart rate.   Yes [provider]  mirtazapine (REMERON) 15 MG tablet Take 15 mg by mouth at bedtime.   Yes [provider]  Multiple Vitamins-Minerals (PRESERVISION AREDS 2 PO) Take 1 tablet by mouth 2 (two) times daily.   Yes [provider]  Probiotic Product (PROBIOTIC DAILY PO) Take 1 tablet by mouth daily.    Yes [provider]  RABEprazole (ACIPHEX) 20 MG tablet Take 1 tablet (20 mg total) by mouth 2 (two) times daily. 12/11/16  Yes [place] Lighter, MD  vitamin B-12 (CYANOCOBALAMIN) 1000 MCG tablet Take 1,000 mcg by mouth at bedtime.    Yes [provider]  warfarin (COUMADIN) 3 MG tablet Take 3.5 mg by mouth at bedtime.  01/12/18  Yes [provider]  Wheat Dextrin (BENEFIBER DRINK MIX PO) Take 1 Dose by mouth every other day.   Yes [provider]  acetaminophen  (TYLENOL) 325 MG tablet Take 325 mg by mouth every 6 (six) hours as needed for mild pain.     [provider]  docusate sodium (COLACE) 100 MG capsule Take 100 mg by mouth daily as needed for mild constipation.    [provider]  fexofenadine (ALLEGRA) 180 MG tablet Take 180 mg by mouth as needed for allergies or rhinitis.    [provider]  Menthol, Topical Analgesic, (BENGAY VANISHING SCENT) 2.5 % GEL Apply topically as needed (for pain).     [provider]  nitroGLYCERIN (NITROSTAT) 0.4 MG SL tablet Place 0.4 mg under the tongue every 5 (five) minutes as needed for chest pain.     [provider]  polyethylene glycol (MIRALAX / GLYCOLAX) packet Take 17 g by mouth every other day. Patient not taking: Reported on 01/17/2018 12/11/16   [place] Lighter, MD  sucralfate (CARAFATE) 1 GM/10ML suspension Take 10 mLs (1 g total) by mouth every 6 (six) hours. Patient not taking: Reported on 09/01/2017 12/11/16   [place] Lighter, MD  traMADol (ULTRAM) 50 MG tablet Take 1-2 tablets (50-100 mg total) by mouth every 4 (four) hours as needed for moderate pain. 02/28/16   Watt Climes, PA    Allergies Alendronate sodium; Amoxicillin-pot clavulanate; Etodolac; Ibandronate sodium; Iodine; Lactose intolerance (gi); Minocycline hcl; Risedronate sodium; Sulfonamide derivatives; and Zoledronic acid  Family History  Problem Relation Age of Onset  . Heart disease Mother   . Stroke Father   . Heart disease Father   . Breast cancer Sister        x 2, in their 54's  . Lung cancer Brother   . Colon cancer Neg Hx   . Ovarian cancer Neg Hx     Social History Social History   Tobacco Use  . Smoking status: Never Smoker  . Smokeless tobacco: Never Used  Substance Use Topics  . Alcohol use: No    Alcohol/week: 0.0 oz  . Drug use: No    Review of Systems  Constitutional: No fever/chills Eyes: No visual changes. ENT: No sore throat. Cardiovascular: As  above Respiratory: Denies shortness of breath. Gastrointestinal: No abdominal pain.  No nausea, no vomiting.  No diarrhea.  No constipation. Genitourinary: Negative for dysuria. Musculoskeletal: Negative for back pain. Skin: Negative for rash. Neurological: Negative for headaches, focal weakness or numbness.   ____________________________________________   PHYSICAL EXAM:  VITAL SIGNS: ED Triage Vitals  Enc Vitals Group     BP 02/14/18 0919 106/63     Pulse Rate 02/14/18 0919 72     Resp 02/14/18 0919 20     Temp 02/14/18 0917 99.3 F (37.4 C)     Temp Source 02/14/18 0917 Oral     SpO2 02/14/18 0919 97 %     Weight 02/14/18 0915 114 lb (51.7 kg)     Height 02/14/18 0915 4\' 7"  (1.397 m)     Head Circumference --      Peak Flow --      Pain Score --      Pain Loc --      Pain Edu? --      Excl. in [place]? --     Constitutional: Alert and oriented. Well appearing and in no acute distress. Eyes: Conjunctivae are normal.  Head: Atraumatic. Nose: No congestion/rhinnorhea. Mouth/Throat: Mucous membranes are moist.  Neck: No stridor.   Cardiovascular: Tachycardic with an irregularly irregular rhythm.  Grossly normal heart sounds.  On the monitor the patient is intermittently between an irregularly irregular rhythm in the low 100s and then a regular rhythm at 140. Respiratory: Normal respiratory effort.  No retractions. Lungs CTAB. Gastrointestinal: Soft and nontender. No distention.  Musculoskeletal: No lower extremity tenderness nor edema.  No joint effusions. Neurologic:  Normal speech and language. No gross focal neurologic deficits are appreciated. Skin:  Skin is warm, dry and intact. No rash noted. Psychiatric: Mood and affect are normal. Speech and behavior are normal.  ____________________________________________   LABS (all labs ordered are listed, but only abnormal results  are displayed)  Labs Reviewed  BASIC METABOLIC PANEL - Abnormal; Notable for the following  components:      Result Value   Glucose, Bld 164 (*)    Creatinine, Ser 1.24 (*)    Calcium 8.7 (*)    GFR calc non Af Amer 38 (*)    GFR calc Af Amer 44 (*)    All other components within normal limits  PROTIME-INR - Abnormal; Notable for the following components:   Prothrombin Time 26.9 (*)    All other components within normal limits  BRAIN NATRIURETIC PEPTIDE - Abnormal; Notable for the following components:   B Natriuretic Peptide 166.0 (*)    All other components within normal limits  CBC WITH DIFFERENTIAL/PLATELET - Abnormal; Notable for the following components:   Monocytes Absolute 1.1 (*)    All other components within normal limits  TROPONIN I  CBC WITH DIFFERENTIAL/PLATELET   ____________________________________________  EKG  ED ECG REPORT I, Doran Stabler, the attending physician, personally viewed and interpreted this ECG.   Date: 02/14/2018  EKG Time: 0918  Rate: 111  Rhythm: atrial fibrillation, rate 111  Axis: Normal  Intervals:left anterior fascicular block and Incomplete right bundle branch block  ST&T Change: ST segment depression in V5 and V6.  T wave inversions in 1, AVL.  Minimal ST elevation in V2 and V3 versus wandering baseline.  No significant change from prehospital EKGs.  However, the depressions as well as inversions are new from the patient's last EKG of January 17, 2018.  Changes appear to be demand related because of the change from the EKG of 920 which was at 113 bpm.  ED ECG REPORT I, Doran Stabler, the attending physician, personally viewed and interpreted this ECG.   Date: 02/14/2018  EKG Time: 0920  Rate: 113  Rhythm: Atrial flutter  Axis: Normal  Intervals:left anterior fascicular block  ST&T Change: Minimal ST depression in V5 and V6.  T wave inversions in aVL.  Biphasic T wave in lead I.  ____________________________________________  RADIOLOGY  No active disease on the chest  x-ray ____________________________________________   PROCEDURES  Procedure(s) performed:   .Critical Care Performed by: Orbie Pyo, MD Authorized by: Orbie Pyo, MD   Critical care provider statement:    Critical care time (minutes):  35   Critical care was necessary to treat or prevent imminent or life-threatening deterioration of the following conditions:  Circulatory failure   Critical care was time spent personally by me on the following activities:  Examination of patient, ordering and performing treatments and interventions, ordering and review of laboratory studies and re-evaluation of patient's condition    Critical Care performed:    ____________________________________________   INITIAL IMPRESSION / ASSESSMENT AND PLAN / ED COURSE  Pertinent labs & imaging results that were available during my care of the patient were reviewed by me and considered in my medical decision making (see chart for details).  Differential diagnosis includes, but is not limited to, ACS, aortic dissection, pulmonary embolism, cardiac tamponade, pneumothorax, pneumonia, pericarditis, myocarditis, GI-related causes including esophagitis/gastritis, and musculoskeletal chest wall pain.   As part of my medical decision making, I reviewed the following data within the electronic MEDICAL RECORD NUMBER Notes from prior ED visits  ----------------------------------------- 11:27 AM on 02/14/2018 -----------------------------------------  Patient appears to have converted to sinus on the Cardizem drip.  I discussed the case with the patient's daughter says that the patient does get palpitations and a fast heart rate intermittently but  usually does not of pain with it.  This morning was different because of the pain associated with her tachydysrhythmia.  Patient will be admitted to the hospital.  I am concerned about the characteristics of the presentation this morning especially that  the patient required nitroglycerin to relieve her chest pain.  Patient is taking 81 mg of aspirin and is on Coumadin.  I will not give any further anticoagulation at this time.  Patient and family aware of need for admission to the hospital.  Signed out to Dr. Jerelyn Charles.      ____________________________________________   FINAL CLINICAL IMPRESSION(S) / ED DIAGNOSES  Chest pain.  Atrial fibrillation with rapid ventricular response.    NEW MEDICATIONS STARTED DURING THIS VISIT:  New Prescriptions   No medications on file     Note:  This document was prepared using Dragon voice recognition software and may include unintentional dictation errors.     Orbie Pyo, MD 02/14/18 1128

## 2018-02-15 DIAGNOSIS — I4891 Unspecified atrial fibrillation: Secondary | ICD-10-CM | POA: Diagnosis not present

## 2018-02-15 LAB — PROTIME-INR
INR: 3.04
Prothrombin Time: 31.2 seconds — ABNORMAL HIGH (ref 11.4–15.2)

## 2018-02-15 MED ORDER — SIMETHICONE 80 MG PO CHEW: 80.0000 mg | CHEWABLE_TABLET | Freq: Four times a day (QID) | ORAL | 2 refills | Status: DC | PRN

## 2018-02-15 MED ORDER — DILTIAZEM HCL ER COATED BEADS 300 MG PO CP24: 300.0000 mg | ORAL_CAPSULE | Freq: Every day | ORAL | 11 refills | Status: DC

## 2018-02-15 NOTE — Care Management Obs Status (Signed)
[facility] NOTIFICATION   Patient Details  Name: Peggy [name] MRN: [id number] Date of Birth: April 17, 1928   Medicare Observation Status Notification Given:  No  Discharge order placed in < 24hr of being placed on observation   Katrina Stack, RN 02/15/2018, 10:31 AM

## 2018-02-15 NOTE — Discharge Instructions (Signed)
[facility] [place] [facility] ° °DIET:  °Cardiac diet ° °DISCHARGE CONDITION:  °Stable ° °ACTIVITY:  °Activity as tolerated ° °OXYGEN:  °Home Oxygen: No. °  °Oxygen Delivery: room air ° °DISCHARGE LOCATION:  °home  ° ° °ADDITIONAL DISCHARGE INSTRUCTION: ° ° °If you experience worsening of your admission symptoms, develop shortness of breath, life threatening emergency, suicidal or homicidal thoughts you must seek medical attention immediately by calling 911 or calling your MD immediately  if symptoms less severe. ° °You Must read complete instructions/literature along with all the possible adverse reactions/side effects for all the Medicines you take and that have been prescribed to you. Take any new Medicines after you have completely understood and accpet all the possible adverse reactions/side effects.  ° °Please note ° °You were cared for by a hospitalist during your hospital stay. If you have any questions about your discharge medications or the care you received while you were in the hospital after you are discharged, you can call the unit and asked to speak with the hospitalist on call if the hospitalist that took care of you is not available. Once you are discharged, your primary care physician will handle any further medical issues. Please note that NO REFILLS for any discharge medications will be authorized once you are discharged, as it is imperative that you return to your primary care physician (or establish a relationship with a primary care physician if you do not have one) for your aftercare needs so that they can reassess your need for medications and monitor your lab values. ° ° °

## 2018-02-15 NOTE — Progress Notes (Signed)
Discharge instructions explained to pt and pts daughter, Florentina Jenny, [place] and legal guardian/ verbalized an understanding/ iv and tele removed/ transported off unit via wheelchair/ pt daughter to transfer home

## 2018-02-15 NOTE — Care Management (Signed)
Informed by members of care team there are no discharge concerns.  No CM consult present

## 2018-02-15 NOTE — Discharge Summary (Signed)
[employer] [facility] [place] at [facility] [place], 84 y.o., DOB 07/14/1928, MRN [id number]. Admission date: 02/14/2018 Discharge Date 02/15/2018 Primary MD [place] Pink, MD Admitting Physician Gorden Harms, MD  Admission Diagnosis  Atrial flutter with rapid ventricular response [facility]) [I48.92] Atrial fibrillation with RVR ([place]) [I48.91] Chest pain, unspecified type [R07.9]  Discharge Diagnosis   Active Problems: Chest pain related to A. fib with RVR Angina patient had a stress test in September results reviewed showed low risk scan Essential hypertension GERD Hyperlipidemia History of breast cancer Previous history of stroke     Hospital Course -year-old female presented with complaint of having chest pain was noted to have A. fib with RVR.  Patient did have a stress test in September 2018 the results were reviewed which showed a negative stress test.  Because of her chest pain she was admitted cardiac enzymes remain negative.  Heart rate improved now her symptoms have resolved.  Patient very anxious to go back to where she stays at this time she is stable for discharge with outpatient follow-up with her primary cardiologist.  Her Cardizem has been adjusted.            Consults  cardiology  Significant Tests:  See full reports for all details     Dg Chest 1 View  Result Date: 02/14/2018 CLINICAL DATA:  Chest discomfort. EXAM: CHEST 1 VIEW COMPARISON:  January 17, 2018 FINDINGS: The heart size and mediastinal contours are within normal limits. Both lungs are clear. The visualized skeletal structures are unremarkable. IMPRESSION: No active disease. Electronically Signed   By: Dorise Bullion III M.D   On: 02/14/2018 09:52   Dg Chest 2 View  Result Date: 01/17/2018 CLINICAL DATA:  Anterior chest pain radiating into the left arm for several hours EXAM: CHEST  2 VIEW COMPARISON:  12/08/2016 FINDINGS: Cardiac shadow is stable. Aortic calcifications are  again seen. The lungs are well aerated bilaterally with chronic interstitial changes. No focal infiltrate or sizable effusion is seen. Bony structures demonstrate chronic appearing compression deformities in the lower thoracic and upper lumbar spine. No other bony abnormality is noted. IMPRESSION: Chronic changes without acute abnormality. Electronically Signed   By: Inez Catalina M.D.   On: 01/17/2018 10:29       Today   Subjective:   Peggy [name] doing well denies any chest pain or shortness of breath  Objective:   Blood pressure 140/63, pulse 84, temperature 98.5 F (36.9 C), temperature source Oral, resp. rate 18, height 4\' 7"  (1.397 m), weight 106 lb 7.7 oz (48.3 kg), SpO2 97 %.  .  Intake/Output Summary (Last 24 hours) at 02/15/2018 1549 Last data filed at 02/15/2018 1006 Gross per 24 hour  Intake 360 ml  Output 600 ml  Net -240 ml    Exam VITAL SIGNS: Blood pressure 140/63, pulse 84, temperature 98.5 F (36.9 C), temperature source Oral, resp. rate 18, height 4\' 7"  (1.397 m), weight 106 lb 7.7 oz (48.3 kg), SpO2 97 %.  GENERAL:  84 y.o.-year-old patient lying in the bed with no acute distress.  EYES: Pupils equal, round, reactive to light and accommodation. No scleral icterus. Extraocular muscles intact.  HEENT: Head atraumatic, normocephalic. Oropharynx and nasopharynx clear.  NECK:  Supple, no jugular venous distention. No thyroid enlargement, no tenderness.  LUNGS: Normal breath sounds bilaterally, no wheezing, rales,rhonchi or crepitation. No use of accessory muscles of respiration.  CARDIOVASCULAR: S1, S2 normal. No murmurs, rubs, or gallops.  ABDOMEN: Soft,  nontender, nondistended. Bowel sounds present. No organomegaly or mass.  EXTREMITIES: No pedal edema, cyanosis, or clubbing.  NEUROLOGIC: Cranial nerves II through XII are intact. Muscle strength 5/5 in all extremities. Sensation intact. Gait not checked.  PSYCHIATRIC: The patient is alert and oriented x 3.  SKIN:  No obvious rash, lesion, or ulcer.   Data Review     CBC w Diff:  Lab Results  Component Value Date   WBC 9.1 02/14/2018   HGB 12.3 02/14/2018   HGB 11.2 (L) 10/25/2014   HCT 36.6 02/14/2018   HCT 36.0 10/25/2014   PLT 275 02/14/2018   PLT 307 10/25/2014   LYMPHOPCT 22 02/14/2018   LYMPHOPCT 38.2 10/25/2014   MONOPCT 12 02/14/2018   MONOPCT 10.1 10/25/2014   EOSPCT 2 02/14/2018   EOSPCT 2.5 10/25/2014   BASOPCT 1 02/14/2018   BASOPCT 1.2 10/25/2014   CMP:  Lab Results  Component Value Date   NA 137 02/14/2018   NA 143 10/24/2014   K 4.5 02/14/2018   K 4.0 10/24/2014   CL 105 02/14/2018   CL 109 (H) 10/24/2014   CO2 22 02/14/2018   CO2 26 10/24/2014   BUN 17 02/14/2018   BUN 13 10/24/2014   CREATININE 1.24 (H) 02/14/2018   CREATININE 0.73 10/24/2014   PROT 7.1 09/16/2017   PROT 6.4 10/24/2014   ALBUMIN 3.6 09/16/2017   ALBUMIN 2.9 (L) 10/24/2014   BILITOT 0.7 09/16/2017   BILITOT 0.7 10/24/2014   ALKPHOS 90 09/16/2017   ALKPHOS 71 10/24/2014   AST 26 09/16/2017   AST 28 10/24/2014   ALT 11 (L) 09/16/2017   ALT 27 10/24/2014  .  Micro Results Recent Results (from the past 240 hour(s))  MRSA PCR Screening     Status: None   Collection Time: 02/14/18  3:45 PM  Result Value Ref Range Status   MRSA by PCR NEGATIVE NEGATIVE Final    Comment:        The GeneXpert MRSA Assay (FDA approved for NASAL specimens only), is one component of a comprehensive MRSA colonization surveillance program. It is not intended to diagnose MRSA infection nor to guide or monitor treatment for MRSA infections. Performed at [facility], [address] [place], [place] [address]      Code Status History    Date Active Date Inactive Code Status Order ID Comments User Context   02/14/2018 15:03 02/15/2018 14:38 Full Code [id number]  Gorden Harms, MD Inpatient   09/01/2017 17:02 09/02/2017 16:25 Partial Code [id number]  Vaughan Basta, MD Inpatient    09/01/2017 15:44 09/01/2017 17:02 DNR [id number]  Vaughan Basta, MD Inpatient   12/08/2016 22:17 12/11/2016 14:50 Full Code [id number]  Gladstone Lighter, MD Inpatient   05/11/2016 23:00 05/12/2016 17:29 Full Code [id number]  Nicholes Mango, MD Inpatient   01/15/2016 11:34 01/16/2016 18:10 Full Code [id number]  Saundra Shelling, MD ED   03/11/2015 17:33 03/12/2015 18:42 Full Code [id number]  Jonetta Osgood, MD Inpatient    Advance Directive Documentation     Most Recent Value  Type of Advance Directive  Healthcare Power of Attorney, Living will  Pre-existing out of facility DNR order (yellow form or pink MOST form)  No data  "MOST" Form in Place?  No data          Follow-up Information    Teodoro Spray, MD. Go on 02/23/2018.   Specialty:  Cardiology Why:  Appointment Time: 10:00am Contact information: [place] [facility] [place]  [place] [address] [phone]           Discharge Medications   Allergies as of 02/15/2018      Reactions   Alendronate Sodium    REACTION: questionable   Amoxicillin-pot Clavulanate    Etodolac    Ibandronate Sodium    REACTION: questionable   Iodine    Other reaction(s): Unknown   Lactose Intolerance (gi) Other (See Comments)   GI problems   Minocycline Hcl    Risedronate Sodium    Sulfonamide Derivatives Other (See Comments)   Stomach pain   Zoledronic Acid    REACTION: "dental problems"      Medication List    TAKE these medications   acetaminophen 325 MG tablet Commonly known as:  TYLENOL Take 325 mg by mouth every 6 (six) hours as needed for mild pain.   ALPRAZolam 0.25 MG tablet Commonly known as:  XANAX Take 0.25 mg by mouth at bedtime as needed for anxiety or sleep.   aspirin EC 81 MG tablet Take 81 mg by mouth daily.   BENEFIBER DRINK MIX PO Take 1 Dose by mouth every other day.   BENGAY VANISHING SCENT 2.5 % Gel Generic drug:  Menthol (Topical Analgesic) Apply topically as needed  (for pain).   diltiazem 300 MG 24 hr capsule Commonly known as:  CARDIZEM CD Take 1 capsule (300 mg total) by mouth daily. What changed:    medication strength  how much to take   docusate sodium 100 MG capsule Commonly known as:  COLACE Take 100 mg by mouth daily as needed for mild constipation.   escitalopram 10 MG tablet Commonly known as:  LEXAPRO Take 10 mg by mouth at bedtime.   fexofenadine 180 MG tablet Commonly known as:  ALLEGRA Take 180 mg by mouth as needed for allergies or rhinitis.   metoprolol tartrate 25 MG tablet Commonly known as:  LOPRESSOR Take 25 mg by mouth 2 (two) times daily. May take additional 25 mg if needed for heart rate.   mirtazapine 15 MG tablet Commonly known as:  REMERON Take 15 mg by mouth at bedtime.   nitroGLYCERIN 0.4 MG SL tablet Commonly known as:  NITROSTAT Place 0.4 mg under the tongue every 5 (five) minutes as needed for chest pain.   PRESERVISION AREDS 2 PO Take 1 tablet by mouth 2 (two) times daily.   PROBIOTIC DAILY PO Take 1 tablet by mouth daily.   RA CALCIUM PLUS VITAMIN D 600-400 MG-UNIT tablet Generic drug:  Calcium Carbonate-Vitamin D Take 1 tablet by mouth 2 (two) times daily.   RABEprazole 20 MG tablet Commonly known as:  ACIPHEX Take 1 tablet (20 mg total) by mouth 2 (two) times daily.   simethicone 80 MG chewable tablet Commonly known as:  GAS-X Chew 1 tablet (80 mg total) by mouth 4 (four) times daily as needed for flatulence.   traMADol 50 MG tablet Commonly known as:  ULTRAM Take 1-2 tablets (50-100 mg total) by mouth every 4 (four) hours as needed for moderate pain.   vitamin B-12 1000 MCG tablet Commonly known as:  CYANOCOBALAMIN Take 1,000 mcg by mouth at bedtime.   warfarin 3 MG tablet Commonly known as:  COUMADIN Take 3.5 mg by mouth at bedtime.          Total Time in preparing paper work, data evaluation and todays exam - 50 minutes  Dustin Flock M.D on 02/15/2018 at 3:49  PM  [place]  [phone] 

## 2018-02-15 NOTE — Progress Notes (Signed)
Everlean Alstrom, pts daughter and legal guardian  Called to notify of discharge / no answer/ voicemail left

## 2018-04-01 ENCOUNTER — Other Ambulatory Visit: Payer: Self-pay

## 2018-04-01 DIAGNOSIS — Z1231 Encounter for screening mammogram for malignant neoplasm of breast: Secondary | ICD-10-CM

## 2018-04-01 NOTE — Progress Notes (Signed)
cr

## 2018-04-16 ENCOUNTER — Emergency Department
Admission: EM | Admit: 2018-04-16 | Discharge: 2018-04-16 | Disposition: A | Discharge status: Skilled Nursing Facility | Payer: Medicare Other | Attending: Emergency Medicine | Admitting: Emergency Medicine

## 2018-04-16 ENCOUNTER — Emergency Department: Payer: Medicare Other

## 2018-04-16 ENCOUNTER — Other Ambulatory Visit: Payer: Self-pay

## 2018-04-16 DIAGNOSIS — Z96641 Presence of right artificial hip joint: Secondary | ICD-10-CM | POA: Diagnosis not present

## 2018-04-16 DIAGNOSIS — Z7901 Long term (current) use of anticoagulants: Secondary | ICD-10-CM | POA: Insufficient documentation

## 2018-04-16 DIAGNOSIS — R197 Diarrhea, unspecified: Secondary | ICD-10-CM | POA: Insufficient documentation

## 2018-04-16 DIAGNOSIS — Z853 Personal history of malignant neoplasm of breast: Secondary | ICD-10-CM | POA: Insufficient documentation

## 2018-04-16 DIAGNOSIS — I10 Essential (primary) hypertension: Secondary | ICD-10-CM | POA: Diagnosis not present

## 2018-04-16 DIAGNOSIS — I2510 Atherosclerotic heart disease of native coronary artery without angina pectoris: Secondary | ICD-10-CM | POA: Diagnosis not present

## 2018-04-16 DIAGNOSIS — Z79899 Other long term (current) drug therapy: Secondary | ICD-10-CM | POA: Diagnosis not present

## 2018-04-16 DIAGNOSIS — R0602 Shortness of breath: Secondary | ICD-10-CM | POA: Diagnosis not present

## 2018-04-16 DIAGNOSIS — Z7982 Long term (current) use of aspirin: Secondary | ICD-10-CM | POA: Insufficient documentation

## 2018-04-16 DIAGNOSIS — R0789 Other chest pain: Secondary | ICD-10-CM | POA: Diagnosis not present

## 2018-04-16 LAB — BASIC METABOLIC PANEL
Anion gap: 6 (ref 5–15)
BUN: 21 mg/dL — ABNORMAL HIGH (ref 6–20)
CHLORIDE: 107 mmol/L (ref 101–111)
CO2: 25 mmol/L (ref 22–32)
Calcium: 8.8 mg/dL — ABNORMAL LOW (ref 8.9–10.3)
Creatinine, Ser: 1.31 mg/dL — ABNORMAL HIGH (ref 0.44–1.00)
GFR calc Af Amer: 41 mL/min — ABNORMAL LOW (ref >60)
GFR calc non Af Amer: 35 mL/min — ABNORMAL LOW (ref >60)
GLUCOSE: 117 mg/dL — AB (ref 65–99)
POTASSIUM: 4.2 mmol/L (ref 3.5–5.1)
SODIUM: 138 mmol/L (ref 135–145)

## 2018-04-16 LAB — CBC WITH DIFFERENTIAL/PLATELET
Basophils Absolute: 0.1 10*3/uL (ref 0–0.1)
Basophils Relative: 1 %
EOS PCT: 3 %
Eosinophils Absolute: 0.2 10*3/uL (ref 0–0.7)
HEMATOCRIT: 35.3 % (ref 35.0–47.0)
HEMOGLOBIN: 11.8 g/dL — AB (ref 12.0–16.0)
LYMPHS ABS: 2.5 10*3/uL (ref 1.0–3.6)
LYMPHS PCT: 32 %
MCH: 30.2 pg (ref 26.0–34.0)
MCHC: 33.3 g/dL (ref 32.0–36.0)
MCV: 90.6 fL (ref 80.0–100.0)
Monocytes Absolute: 0.8 10*3/uL (ref 0.2–0.9)
Monocytes Relative: 11 %
NEUTROS ABS: 4.1 10*3/uL (ref 1.4–6.5)
Neutrophils Relative %: 53 %
PLATELETS: 254 10*3/uL (ref 150–440)
RBC: 3.89 MIL/uL (ref 3.80–5.20)
RDW: 14.2 % (ref 11.5–14.5)
WBC: 7.7 10*3/uL (ref 3.6–11.0)

## 2018-04-16 LAB — TROPONIN I: Troponin I: <0.03 ng/mL (ref <0.03)

## 2018-04-16 LAB — PROTIME-INR
INR: 2.52
Prothrombin Time: 27 seconds — ABNORMAL HIGH (ref 11.4–15.2)

## 2018-04-16 NOTE — ED Triage Notes (Signed)
To ER via [facility] from [place] c/o midsternal CP that occurred PTA. Took 1 SL nitro, belched and pain is gone. Pt alert and oriented X4, active, cooperative, pt in NAD. RR even and unlabored, color WNL.

## 2018-04-16 NOTE — ED Notes (Signed)
Report to Rebecca, RN.

## 2018-04-16 NOTE — Discharge Instructions (Addendum)
Return to the ER for new, worsening, or persistent chest pain, difficulty breathing, weakness or lightheadedness, or any other new or worsening symptoms that concern you.  Take your regular medications as prescribed.  Follow-up with your primary care doctor within 1 to 2 weeks.

## 2018-04-16 NOTE — ED Notes (Signed)
Attempt to call daughter, Jocelyn Lamer listed at legal guardian. No answer, left message.

## 2018-04-16 NOTE — ED Provider Notes (Signed)
[facility] Emergency Department Provider Note ____________________________________________   First MD Initiated Contact with Patient 04/16/18 1509     (approximate)  I have reviewed the triage vital signs and the nursing notes.   HISTORY  Chief Complaint Chest Pain    HPI Peggy [name] is a 84 y.o. female with PMH as noted below who presents with chest pain, described as pressure-like and substernal, lasting for a few minutes, now resolved.  Patient is unsure exactly when it happened but it was likely around 1 to 2 hours ago.  She states that she belched several times and the pain resolved.  She denies associated shortness of breath, nausea, or vomiting.  She reports that she had an episode of diarrhea earlier this morning.  She had a similar episode of diarrhea followed by a milder chest pain earlier in the week which also resolved on its own.  She also reports some shortness of breath with standing.    Past Medical History:  Diagnosis Date  . Allergy   . Anginal pain ([place])    c/o angina on May 04, 2016 releived by Nitroglycerin X 2  . Anxiety   . Arthritis   . Atrial fibrillation ([place])   . Atrial fibrillation ([place])   . Atrial fibrillation with rapid ventricular response [facility]) November 2015   Heart rate up to 140 requiring IV Cardizem  . Breast cancer [facility]) 2012   Patient underwent a left mastectomy on 03-31-11. Pathology showed DCIS at both sites without an invasive component. Sentinel nodes were negative. The breast lesions were noted to be ER +40%, PR-positive, 5%.   . Breast screening, unspecified   . Coronary artery disease   . Diverticulosis   . Dysrhythmia   . Essential hypertension, benign   . GERD (gastroesophageal reflux disease)   . Hearing disorder 2012   hearing aids  . Heart attack ([place]) 2001  . Heart disease 2001  . Hip pain   . Hyperlipidemia   . Hypertension 1996  . Lump or mass in breast   . Malignant neoplasm of  upper-inner quadrant of female breast ([place])   . Malignant neoplasm of upper-outer quadrant of female breast ([place])   . Other benign neoplasm of connective and other soft tissue of thorax   . Personal history of malignant neoplasm of breast   . Reflux    acid reflux  . Shortness of breath dyspnea    with exertion  . Special screening for malignant neoplasms, colon   . Stroke [facility]) 2009  . Unspecified essential hypertension     Patient Active Problem List   Diagnosis Date Noted  . Chest pain 02/14/2018  . Rectal bleed 09/01/2017  . Atrial flutter ([place]) 12/08/2016  . S/P total hip arthroplasty 02/27/2016  . Hip pain 01/15/2016  . Memory loss 03/21/2015  . Abnormality of gait 03/21/2015  . Atrial fibrillation with RVR ([place]) 03/11/2015  . H/O: CVA (cerebrovascular accident) 03/11/2015  . Right temporal lobe infarction ([place]) 04/10/2014  . Blurred vision 09/05/2013  . 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 EPIGASTRIC PAIN 02/14/2009  . NONSPECIFIC ABN FINDNG RAD&OTH EXAM BILARY TRCT 02/14/2009    Past Surgical History:  Procedure Laterality Date  . ABDOMINAL HYSTERECTOMY     age 26  . APPENDECTOMY     age 70  . BREAST BIOPSY     left breast biopsy over 15 years ago   .  CHOLECYSTECTOMY     40 years ago   . COLON SURGERY     Dr. Tamala Julian, [facility]  . COLONOSCOPY  2009   [facility] Dr. Donnella Sham  . EXCISION OF BACK LESION Left 05/09/2016   Procedure: EXCISION OF GLUTEAL MASS;  Surgeon: Robert Bellow, MD;  Location: [facility] ORS;  Service: General;  Laterality: Left;  . EYE SURGERY Bilateral    Cataract Extraction with IOL  . HERNIA REPAIR    . JOINT REPLACEMENT Right    TOTAL HIP REPLACEMENT  . MASTECTOMY  2012   left breast  . SMALL INTESTINE SURGERY  12/31/2012   Abdominal exploration, lysis of adhesions for distal small bowel obstruction. Rochel Brome, MD  . TONSILLECTOMY     as a child  . TOTAL HIP ARTHROPLASTY  Right 02/27/2016   Procedure: TOTAL HIP ARTHROPLASTY;  Surgeon: Dereck Leep, MD;  Location: [facility] ORS;  Service: Orthopedics;  Laterality: Right;  . UPPER GI ENDOSCOPY  2009   [facility] Dr. Donnella Sham    Prior to Admission medications   Medication Sig Start Date End Date Taking? Authorizing Provider  ALPRAZolam Duanne Moron) 0.25 MG tablet Take 0.25 mg by mouth at bedtime as needed for anxiety or sleep.    Yes [provider]  aspirin EC 81 MG tablet Take 81 mg by mouth daily.   Yes [provider]  Calcium Carbonate-Vitamin D (RA CALCIUM PLUS VITAMIN D) 600-400 MG-UNIT tablet Take 1 tablet by mouth 2 (two) times daily.   Yes [provider]  diltiazem (CARDIZEM CD) 300 MG 24 hr capsule Take 1 capsule (300 mg total) by mouth daily. 02/15/18 02/15/19 Yes Dustin Flock, MD  escitalopram (LEXAPRO) 10 MG tablet Take 10 mg by mouth at bedtime.    Yes [provider]  metoprolol tartrate (LOPRESSOR) 25 MG tablet Take 25 mg by mouth 2 (two) times daily. May take additional 25 mg if needed for heart rate.   Yes [provider]  mirtazapine (REMERON) 15 MG tablet Take 15 mg by mouth at bedtime.   Yes [provider]  Multiple Vitamins-Minerals (PRESERVISION AREDS 2 PO) Take 1 tablet by mouth 2 (two) times daily.   Yes [provider]  Probiotic Product (PROBIOTIC DAILY PO) Take 1 tablet by mouth daily.    Yes [provider]  simethicone (GAS-X) 80 MG chewable tablet Chew 1 tablet (80 mg total) by mouth 4 (four) times daily as needed for flatulence. 02/15/18 02/15/19 Yes Dustin Flock, MD  traMADol (ULTRAM) 50 MG tablet Take 1-2 tablets (50-100 mg total) by mouth every 4 (four) hours as needed for moderate pain. 02/28/16  Yes Watt Climes, PA  vitamin B-12 (CYANOCOBALAMIN) 1000 MCG tablet Take 1,000 mcg by mouth at bedtime.    Yes [provider]  warfarin (COUMADIN) 3 MG tablet Take 3.5 mg by mouth at bedtime.  01/12/18  Yes [provider]  RABEprazole (ACIPHEX) 20 MG tablet Take 1 tablet (20 mg total) by mouth 2 (two) times daily. 12/11/16   Gladstone Lighter, MD  Wheat Dextrin (BENEFIBER DRINK MIX PO) Take 1 Dose by mouth every other day.    [provider]    Allergies Alendronate sodium; Amoxicillin-pot clavulanate; Etodolac; Ibandronate sodium; Iodine; Lactose intolerance (gi); Minocycline hcl; Risedronate sodium; Sulfonamide derivatives; and Zoledronic acid  Family History  Problem Relation Age of Onset  . Heart disease Mother   . Stroke Father   . Heart disease Father   . Breast cancer Sister  x 2, in their 19's  . Lung cancer Brother   . Colon cancer Neg Hx   . Ovarian cancer Neg Hx     Social History Social History   Tobacco Use  . Smoking status: Never Smoker  . Smokeless tobacco: Never Used  Substance Use Topics  . Alcohol use: No    Alcohol/week: 0.0 oz  . Drug use: No    Review of Systems  Constitutional: No fever. Eyes: No redness. ENT: No sore throat. Cardiovascular: Positive for resolved chest pain. Respiratory: Positive for shortness of breath. Gastrointestinal: No vomiting.  Genitourinary: Negative for dysuria.  Musculoskeletal: Negative for back pain. Skin: Negative for rash. Neurological: Negative for headache.   ____________________________________________   PHYSICAL EXAM:  VITAL SIGNS: ED Triage Vitals  Enc Vitals Group     BP 04/16/18 1509 104/84     Pulse Rate 04/16/18 1509 64     Resp 04/16/18 1509 18     Temp 04/16/18 1509 98.1 F (36.7 C)     Temp Source 04/16/18 1509 Oral     SpO2 04/16/18 1509 96 %     Weight 04/16/18 1459 109 lb (49.4 kg)     Height 04/16/18 1459 4\' 7"  (1.397 m)     Head Circumference --      Peak Flow --      Pain Score 04/16/18 1459 0     Pain Loc --      Pain Edu? --      Excl. in [place]? --     Constitutional: Alert and oriented. Well appearing and in no acute distress. Eyes: Conjunctivae are normal.    Head: Atraumatic. Nose: No congestion/rhinnorhea. Mouth/Throat: Mucous membranes are moist.   Neck: Normal range of motion.  Cardiovascular: Normal rate, regular rhythm. Grossly normal heart sounds.  Good peripheral circulation.   Respiratory: Normal respiratory effort.  No retractions. Lungs CTAB. Gastrointestinal: Soft and nontender. No distention.  Genitourinary: No flank tenderness. Musculoskeletal: No lower extremity edema.  Extremities warm and well perfused.  Neurologic:  Normal speech and language. No gross focal neurologic deficits are appreciated.  Skin:  Skin is warm and dry. No rash noted. Psychiatric: Mood and affect are normal. Speech and behavior are normal.  ____________________________________________   LABS (all labs ordered are listed, but only abnormal results are displayed)  Labs Reviewed  BASIC METABOLIC PANEL - Abnormal; Notable for the following components:      Result Value   Glucose, Bld 117 (*)    BUN 21 (*)    Creatinine, Ser 1.31 (*)    Calcium 8.8 (*)    GFR calc non Af Amer 35 (*)    GFR calc Af Amer 41 (*)    All other components within normal limits  CBC WITH DIFFERENTIAL/PLATELET - Abnormal; Notable for the following components:   Hemoglobin 11.8 (*)    All other components within normal limits  PROTIME-INR - Abnormal; Notable for the following components:   Prothrombin Time 27.0 (*)    All other components within normal limits  TROPONIN I  TROPONIN I   ____________________________________________  EKG  ED ECG REPORT I, Arta Silence, the attending physician, personally viewed and interpreted this ECG.  Date: 04/16/2018 EKG Time: 1502 Rate: 64 Rhythm: normal sinus rhythm QRS Axis: normal Intervals: LAFB ST/T Wave abnormalities: Nonspecific ST abnormalities inferiorly Narrative Interpretation: no evidence of acute ischemia; no significant change when compared to EKG of  02/14/2018 ____________________________________________  RADIOLOGY  CXR: No focal infiltrate or other  acute findings  ____________________________________________   PROCEDURES  Procedure(s) performed: No  Procedures  Critical Care performed: No ____________________________________________   INITIAL IMPRESSION / ASSESSMENT AND PLAN / ED COURSE  Pertinent labs & imaging results that were available during my care of the patient were reviewed by me and considered in my medical decision making (see chart for details).  84 year old female with PMH as noted above presents with chest pain earlier today which resolved after few minutes  with belching, and occurring after an episode of diarrhea this morning.  Patient also reports mild shortness of breath when standing.  She reports a milder version of the same symptoms occurring one earlier this week.  She now feels well.  I reviewed the past medical records in epic; the patient was last seen in the ED and admitted in February 2019 with chest pain and A. fib with RVR, with negative work-up.  On exam today, the vital signs are normal, the patient is well-appearing, and the remainder the exam is as described above.  EKG shows no acute changes.  Overall presentation is atypical for ACS and her chest pain is more consistent with GERD or other GI etiology.  No clinical evidence for PE, aortic dissection, or other vascular cause, especially given the resolved symptoms.  Patient is not currently in atrial fibrillation.  Plan: Basic labs, cardiac enzymes x2, chest x-ray, and reassess.  Anticipate she will be safe for discharge back to her facility if the work-up is negative.    ----------------------------------------- 6:06 PM on 04/16/2018 -----------------------------------------  Patient has remained stable and without chest pain.  Initial troponin is negative.  Other labs are within normal limits for her.  Pending second troponin at 6:30  PM.  ----------------------------------------- 7:42 PM on 04/16/2018 -----------------------------------------  Repeat troponin is negative.  Patient continues to be asymptomatic.  She would like to go home.  At this time there is no indication for further ED work-up or observation.  Patient is stable for discharge.  I suspect most likely GERD or other GI cause given resolution of the symptoms with belching.  I counseled the patient and her family member on the results of the work-up, and I gave return precautions; they expressed understanding.  ____________________________________________   FINAL CLINICAL IMPRESSION(S) / ED DIAGNOSES  Final diagnoses:  Atypical chest pain      NEW MEDICATIONS STARTED DURING THIS VISIT:  New Prescriptions   No medications on file     Note:  This document was prepared using Dragon voice recognition software and may include unintentional dictation errors.    Arta Silence, MD 04/16/18 [phone]

## 2018-04-16 NOTE — ED Notes (Signed)
Daughter, Olegario Shearer at bedside.

## 2018-04-16 NOTE — ED Notes (Signed)
Attempt for blood X 1 by this RN unsuccessful.

## 2018-04-16 NOTE — ED Notes (Signed)
ED Provider at bedside. 

## 2018-04-16 NOTE — ED Notes (Signed)
Pt has legal guardian who is at bedside.

## 2018-06-17 ENCOUNTER — Ambulatory Visit
Admission: RE | Admit: 2018-06-17 | Discharge: 2018-06-17 | Disposition: A | Discharge status: Home / Self Care | Payer: Medicare Other | Source: Ambulatory Visit | Attending: General Surgery | Admitting: General Surgery

## 2018-06-17 ENCOUNTER — Other Ambulatory Visit: Payer: Self-pay | Admitting: General Surgery

## 2018-06-17 DIAGNOSIS — Z1231 Encounter for screening mammogram for malignant neoplasm of breast: Secondary | ICD-10-CM

## 2018-07-01 ENCOUNTER — Ambulatory Visit: Payer: Medicare Other | Admitting: General Surgery

## 2018-07-15 ENCOUNTER — Encounter: Payer: Self-pay | Admitting: General Surgery

## 2018-07-15 ENCOUNTER — Ambulatory Visit (INDEPENDENT_AMBULATORY_CARE_PROVIDER_SITE_OTHER): Payer: Medicare Other | Admitting: General Surgery

## 2018-07-15 VITALS — BP 112/68 | HR 65 | Resp 14 | Ht <= 58 in | Wt 108.0 lb

## 2018-07-15 DIAGNOSIS — Z853 Personal history of malignant neoplasm of breast: Secondary | ICD-10-CM

## 2018-07-15 NOTE — Patient Instructions (Addendum)
The patient is aware to call back for any questions or concerns.   The patient has been asked to follow up with Dr Kary Kos in one year with a unilateral right breast diagnostic mammogram.

## 2018-07-15 NOTE — Progress Notes (Signed)
Patient ID: Peggy [name], female   DOB: 1928-12-13, 84 y.o.   MRN: [id number]  Chief Complaint  Patient presents with  . Follow-up    HPI Peggy [name] is a 84 y.o. female.  who presents for her left breast cancer follow up and a breast evaluation. The most recent right mammogram was done on 06-17-18.  Patient does perform regular self breast checks and gets regular mammograms done.  No new breast issues.  She is here with her daughter, Lanny Donoso.  HPI  Past Medical History:  Diagnosis Date  . Allergy   . Anginal pain ([place])    c/o angina on May 04, 2016 releived by Nitroglycerin X 2  . Anxiety   . Arthritis   . Atrial fibrillation ([place])   . Atrial fibrillation ([place])   . Atrial fibrillation with rapid ventricular response [facility]) November 2015   Heart rate up to 140 requiring IV Cardizem  . Breast cancer [facility]) 2012   Patient underwent a left mastectomy on 03-31-11. Pathology showed DCIS at both sites without an invasive component. Sentinel nodes were negative. The breast lesions were noted to be ER +40%, PR-positive, 5%.   . Breast screening, unspecified   . Coronary artery disease   . Diverticulosis   . Dysrhythmia   . Essential hypertension, benign   . GERD (gastroesophageal reflux disease)   . Hearing disorder 2012   hearing aids  . Heart attack ([place]) 2001  . Heart disease 2001  . Hip pain   . Hyperlipidemia   . Hypertension 1996  . Lump or mass in breast   . Malignant neoplasm of upper-inner quadrant of female breast ([place])   . Malignant neoplasm of upper-outer quadrant of female breast ([place])   . Other benign neoplasm of connective and other soft tissue of thorax   . Personal history of malignant neoplasm of breast   . Reflux    acid reflux  . Shortness of breath dyspnea    with exertion  . Special screening for malignant neoplasms, colon   . Stroke [facility]) 2009  . Unspecified essential hypertension     Past Surgical History:  Procedure Laterality Date  .  ABDOMINAL HYSTERECTOMY     age 77  . APPENDECTOMY     age 11  . BREAST BIOPSY     left breast biopsy over 15 years ago   . CHOLECYSTECTOMY     40 years ago   . COLON SURGERY     Dr. Tamala Julian, [facility]  . COLONOSCOPY  2009   [facility] Dr. Donnella Sham  . EXCISION OF BACK LESION Left 05/09/2016   Procedure: EXCISION OF GLUTEAL MASS;  Surgeon: Robert Bellow, MD;  Location: [facility] ORS;  Service: General;  Laterality: Left;  . EYE SURGERY Bilateral    Cataract Extraction with IOL  . HERNIA REPAIR    . JOINT REPLACEMENT Right    TOTAL HIP REPLACEMENT  . MASTECTOMY  2012   left breast  . SMALL INTESTINE SURGERY  12/31/2012   Abdominal exploration, lysis of adhesions for distal small bowel obstruction. Rochel Brome, MD  . TONSILLECTOMY     as a child  . TOTAL HIP ARTHROPLASTY Right 02/27/2016   Procedure: TOTAL HIP ARTHROPLASTY;  Surgeon: Dereck Leep, MD;  Location: [facility] ORS;  Service: Orthopedics;  Laterality: Right;  . UPPER GI ENDOSCOPY  2009   [place] Dr. Donnella Sham    Family History  Problem Relation Age of Onset  . Heart disease Mother   .  Stroke Father   . Heart disease Father   . Breast cancer Sister        x 2, in their 64's  . Lung cancer Brother   . Colon cancer Neg Hx   . Ovarian cancer Neg Hx     Social History Social History   Tobacco Use  . Smoking status: Never Smoker  . Smokeless tobacco: Never Used  Substance Use Topics  . Alcohol use: No    Alcohol/week: 0.0 oz  . Drug use: No    Allergies  Allergen Reactions  . Alendronate Sodium     REACTION: questionable  . Amoxicillin-Pot Clavulanate   . Etodolac   . Ibandronate Sodium     REACTION: questionable  . Iodine     Other reaction(s): Unknown  . Lactose Intolerance (Gi) Other (See Comments)    GI problems  . Minocycline Hcl   . Risedronate Sodium   . Sulfonamide Derivatives Other (See Comments)    Stomach pain  . Zoledronic Acid     REACTION: "dental problems"    Current Outpatient Medications   Medication Sig Dispense Refill  . ALPRAZolam (XANAX) 0.25 MG tablet Take 0.25 mg by mouth at bedtime as needed for anxiety or sleep.     Marland Kitchen aspirin EC 81 MG tablet Take 81 mg by mouth daily.    . Calcium Carbonate-Vitamin D (RA CALCIUM PLUS VITAMIN D) 600-400 MG-UNIT tablet Take 1 tablet by mouth 2 (two) times daily.    Marland Kitchen diltiazem (CARDIZEM CD) 300 MG 24 hr capsule Take 1 capsule (300 mg total) by mouth daily. 30 capsule 11  . escitalopram (LEXAPRO) 10 MG tablet Take 10 mg by mouth at bedtime.     . isosorbide mononitrate (IMDUR) 30 MG 24 hr tablet Take by mouth.    . metoprolol tartrate (LOPRESSOR) 25 MG tablet Take 25 mg by mouth 2 (two) times daily. May take additional 25 mg if needed for heart rate.    . mirtazapine (REMERON) 15 MG tablet Take 15 mg by mouth at bedtime.    . Multiple Vitamins-Minerals (PRESERVISION AREDS 2 PO) Take 1 tablet by mouth 2 (two) times daily.    . Probiotic Product (PROBIOTIC DAILY PO) Take 1 tablet by mouth daily.     . RABEprazole (ACIPHEX) 20 MG tablet Take 1 tablet (20 mg total) by mouth 2 (two) times daily. 60 tablet 2  . simethicone (GAS-X) 80 MG chewable tablet Chew 1 tablet (80 mg total) by mouth 4 (four) times daily as needed for flatulence. 100 tablet 2  . traMADol (ULTRAM) 50 MG tablet Take 1-2 tablets (50-100 mg total) by mouth every 4 (four) hours as needed for moderate pain. 30 tablet 0  . vitamin B-12 (CYANOCOBALAMIN) 1000 MCG tablet Take 1,000 mcg by mouth at bedtime.     Marland Kitchen warfarin (COUMADIN) 3 MG tablet Take 3.5 mg by mouth at bedtime.   1  . Wheat Dextrin (BENEFIBER DRINK MIX PO) Take 1 Dose by mouth every other day.     No current facility-administered medications for this visit.     Review of Systems Review of Systems  Constitutional: Negative.   Respiratory: Negative.   Cardiovascular: Negative.     Blood pressure 112/68, pulse 65, resp. rate 14, height 4\' 7"  (1.397 m), weight 108 lb (49 kg).  Physical Exam Physical Exam   Constitutional: She is oriented to person, place, and time. She appears well-developed and well-nourished.  HENT:  Mouth/Throat: Oropharynx is clear and moist.  Eyes: Conjunctivae are normal. No scleral icterus.  Neck: Neck supple.  Cardiovascular: Normal rate and regular rhythm.  Murmur heard.  Systolic murmur is present with a grade of 2/6. Pulmonary/Chest: Effort normal and breath sounds normal. Right breast exhibits no inverted nipple, no mass, no nipple discharge, no skin change and no tenderness.  Left mastectomy scar clean.  Lymphadenopathy:    She has no cervical adenopathy.    She has no axillary adenopathy.  Neurological: She is alert and oriented to person, place, and time.  Skin: Skin is warm and dry.  Psychiatric: Her behavior is normal.    Data Reviewed Right breast screening mammogram dated June 17, 2018 was reviewed.  BI-RADS-1.  Assessment    Evidence of recurrent DCIS.    Plan    Follow up as needed or if she develops symptoms.   The patient has been asked to follow up with Dr Kary Kos in one year with a unilateral right breast diagnostic mammogram.      The patient is aware to call back for any questions or new concerns.   HPI, Physical Exam, Assessment and Plan have been scribed under the direction and in the presence of Robert Bellow, MD. Karie Fetch, RN  I have completed the exam and reviewed the above documentation for accuracy and completeness.  I agree with the above.  Haematologist has been used and any errors in dictation or transcription are unintentional.  Hervey Ard, M.D., F.A.C.S.  Forest Gleason Tiarna Koppen 07/16/2018, 5:27 PM

## 2018-09-27 ENCOUNTER — Emergency Department
Admission: EM | Admit: 2018-09-27 | Discharge: 2018-09-27 | Disposition: A | Discharge status: Home / Self Care | Payer: Medicare Other | Attending: Emergency Medicine | Admitting: Emergency Medicine

## 2018-09-27 ENCOUNTER — Other Ambulatory Visit: Payer: Self-pay

## 2018-09-27 DIAGNOSIS — R531 Weakness: Secondary | ICD-10-CM | POA: Diagnosis not present

## 2018-09-27 DIAGNOSIS — Z7901 Long term (current) use of anticoagulants: Secondary | ICD-10-CM | POA: Insufficient documentation

## 2018-09-27 DIAGNOSIS — Z7982 Long term (current) use of aspirin: Secondary | ICD-10-CM | POA: Insufficient documentation

## 2018-09-27 DIAGNOSIS — Z79899 Other long term (current) drug therapy: Secondary | ICD-10-CM | POA: Diagnosis not present

## 2018-09-27 DIAGNOSIS — Z8673 Personal history of transient ischemic attack (TIA), and cerebral infarction without residual deficits: Secondary | ICD-10-CM | POA: Insufficient documentation

## 2018-09-27 DIAGNOSIS — Z96641 Presence of right artificial hip joint: Secondary | ICD-10-CM | POA: Diagnosis not present

## 2018-09-27 DIAGNOSIS — E860 Dehydration: Secondary | ICD-10-CM | POA: Diagnosis not present

## 2018-09-27 DIAGNOSIS — R197 Diarrhea, unspecified: Secondary | ICD-10-CM | POA: Insufficient documentation

## 2018-09-27 DIAGNOSIS — N390 Urinary tract infection, site not specified: Secondary | ICD-10-CM

## 2018-09-27 DIAGNOSIS — I10 Essential (primary) hypertension: Secondary | ICD-10-CM | POA: Insufficient documentation

## 2018-09-27 DIAGNOSIS — I2510 Atherosclerotic heart disease of native coronary artery without angina pectoris: Secondary | ICD-10-CM | POA: Diagnosis not present

## 2018-09-27 DIAGNOSIS — Z853 Personal history of malignant neoplasm of breast: Secondary | ICD-10-CM | POA: Diagnosis not present

## 2018-09-27 LAB — URINALYSIS, COMPLETE (UACMP) WITH MICROSCOPIC
BILIRUBIN URINE: NEGATIVE
GLUCOSE, UA: NEGATIVE mg/dL
HGB URINE DIPSTICK: NEGATIVE
KETONES UR: NEGATIVE mg/dL
NITRITE: NEGATIVE
PROTEIN: NEGATIVE mg/dL
Specific Gravity, Urine: 1.006 (ref 1.005–1.030)
pH: 7 (ref 5.0–8.0)

## 2018-09-27 LAB — COMPREHENSIVE METABOLIC PANEL
ALT: 12 U/L (ref 0–44)
ANION GAP: 9 (ref 5–15)
AST: 24 U/L (ref 15–41)
Albumin: 3.8 g/dL (ref 3.5–5.0)
Alkaline Phosphatase: 114 U/L (ref 38–126)
BUN: 14 mg/dL (ref 8–23)
CALCIUM: 8.9 mg/dL (ref 8.9–10.3)
CO2: 23 mmol/L (ref 22–32)
CREATININE: 1.04 mg/dL — AB (ref 0.44–1.00)
Chloride: 108 mmol/L (ref 98–111)
GFR, EST AFRICAN AMERICAN: 54 mL/min — AB (ref >60)
GFR, EST NON AFRICAN AMERICAN: 47 mL/min — AB (ref >60)
Glucose, Bld: 144 mg/dL — ABNORMAL HIGH (ref 70–99)
Potassium: 4 mmol/L (ref 3.5–5.1)
SODIUM: 140 mmol/L (ref 135–145)
Total Bilirubin: 0.5 mg/dL (ref 0.3–1.2)
Total Protein: 7.1 g/dL (ref 6.5–8.1)

## 2018-09-27 LAB — CBC
HCT: 36.8 % (ref 35.0–47.0)
HEMOGLOBIN: 12.5 g/dL (ref 12.0–16.0)
MCH: 31 pg (ref 26.0–34.0)
MCHC: 34 g/dL (ref 32.0–36.0)
MCV: 91 fL (ref 80.0–100.0)
Platelets: 269 10*3/uL (ref 150–440)
RBC: 4.04 MIL/uL (ref 3.80–5.20)
RDW: 13.6 % (ref 11.5–14.5)
WBC: 7.2 10*3/uL (ref 3.6–11.0)

## 2018-09-27 LAB — LIPASE, BLOOD: LIPASE: 37 U/L (ref 11–51)

## 2018-09-27 MED ORDER — CEPHALEXIN 500 MG PO CAPS
500.0000 mg | ORAL_CAPSULE | Freq: Once | ORAL | Status: AC
  Administered 2018-09-27: 500 mg via ORAL
  Filled 2018-09-27: qty 1

## 2018-09-27 MED ORDER — CEPHALEXIN 500 MG PO CAPS: 500.0000 mg | ORAL_CAPSULE | Freq: Three times a day (TID) | ORAL | 0 refills | Status: DC

## 2018-09-27 MED ORDER — SODIUM CHLORIDE 0.9 % IV BOLUS
1000.0000 mL | Freq: Once | INTRAVENOUS | Status: AC
  Administered 2018-09-27: 1000 mL via INTRAVENOUS

## 2018-09-27 NOTE — ED Provider Notes (Signed)
[facility] Emergency Department Provider Note   ____________________________________________   I have reviewed the triage vital signs and the nursing notes.   HISTORY  Chief Complaint Weakness and Diarrhea   History limited by: Not Limited   HPI Peggy [name] is a 84 y.o. female who presents to the emergency department today because of concerns for weakness and diarrhea.  The patient started feeling weak yesterday.  Did not start having diarrhea until today.  Has had roughly 4 episodes.  These have all been nonbloody.  The patient denies any fevers.  No chills.  She denies any urinary symptoms.  Denies any abdominal pain.   Per medical record review patient has a history of atrial fibrillation  Past Medical History:  Diagnosis Date  . Allergy   . Anginal pain ([place])    c/o angina on May 04, 2016 releived by Nitroglycerin X 2  . Anxiety   . Arthritis   . Atrial fibrillation ([place])   . Atrial fibrillation ([address])   . Atrial fibrillation with rapid ventricular response [facility]) November 2015   Heart rate up to 140 requiring IV Cardizem  . Breast cancer [facility]) 2012   Patient underwent a left mastectomy on 03-31-11. Pathology showed DCIS at both sites without an invasive component. Sentinel nodes were negative. The breast lesions were noted to be ER +40%, PR-positive, 5%.   . Breast screening, unspecified   . Coronary artery disease   . Diverticulosis   . Dysrhythmia   . Essential hypertension, benign   . GERD (gastroesophageal reflux disease)   . Hearing disorder 2012   hearing aids  . Heart attack ([place]) 2001  . Heart disease 2001  . Hip pain   . Hyperlipidemia   . Hypertension 1996  . Lump or mass in breast   . Malignant neoplasm of upper-inner quadrant of female breast ([place])   . Malignant neoplasm of upper-outer quadrant of female breast ([place])   . Other benign neoplasm of connective and other soft tissue of thorax   . Personal history of malignant  neoplasm of breast   . Reflux    acid reflux  . Shortness of breath dyspnea    with exertion  . Special screening for malignant neoplasms, colon   . Stroke [facility]) 2009  . Unspecified essential hypertension     Patient Active Problem List   Diagnosis Date Noted  . Chest pain 02/14/2018  . Rectal bleed 09/01/2017  . Atrial flutter ([place]) 12/08/2016  . S/P total hip arthroplasty 02/27/2016  . Hip pain 01/15/2016  . Memory loss 03/21/2015  . Abnormality of gait 03/21/2015  . Atrial fibrillation with RVR ([place]) 03/11/2015  . H/O: CVA (cerebrovascular accident) 03/11/2015  . Right temporal lobe infarction ([place]) 04/10/2014  . Blurred vision 09/05/2013  . 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 EPIGASTRIC PAIN 02/14/2009  . NONSPECIFIC ABN FINDNG RAD&OTH EXAM BILARY TRCT 02/14/2009    Past Surgical History:  Procedure Laterality Date  . ABDOMINAL HYSTERECTOMY     age 25  . APPENDECTOMY     age 17  . BREAST BIOPSY     left breast biopsy over 15 years ago   . CHOLECYSTECTOMY     40 years ago   . COLON SURGERY     Dr. Tamala Julian, [facility]  . COLONOSCOPY  2009   [facility] Dr. Donnella Sham  . EXCISION OF BACK LESION Left 05/09/2016   Procedure: EXCISION OF  GLUTEAL MASS;  Surgeon: Robert Bellow, MD;  Location: [facility] ORS;  Service: General;  Laterality: Left;  . EYE SURGERY Bilateral    Cataract Extraction with IOL  . HERNIA REPAIR    . JOINT REPLACEMENT Right    TOTAL HIP REPLACEMENT  . MASTECTOMY  2012   left breast  . SMALL INTESTINE SURGERY  12/31/2012   Abdominal exploration, lysis of adhesions for distal small bowel obstruction. Rochel Brome, MD  . TONSILLECTOMY     as a child  . TOTAL HIP ARTHROPLASTY Right 02/27/2016   Procedure: TOTAL HIP ARTHROPLASTY;  Surgeon: Dereck Leep, MD;  Location: [facility] ORS;  Service: Orthopedics;  Laterality: Right;  . UPPER GI ENDOSCOPY  2009   [facility] Dr. Donnella Sham    Prior to Admission  medications   Medication Sig Start Date End Date Taking? Authorizing Provider  ALPRAZolam Duanne Moron) 0.25 MG tablet Take 0.25 mg by mouth at bedtime as needed for anxiety or sleep.     [provider]  aspirin EC 81 MG tablet Take 81 mg by mouth daily.    [provider]  Calcium Carbonate-Vitamin D (RA CALCIUM PLUS VITAMIN D) 600-400 MG-UNIT tablet Take 1 tablet by mouth 2 (two) times daily.    [provider]  diltiazem (CARDIZEM CD) 300 MG 24 hr capsule Take 1 capsule (300 mg total) by mouth daily. 02/15/18 02/15/19  Dustin Flock, MD  escitalopram (LEXAPRO) 10 MG tablet Take 10 mg by mouth at bedtime.     [provider]  isosorbide mononitrate (IMDUR) 30 MG 24 hr tablet Take by mouth. 05/12/18 05/12/19  [provider]  metoprolol tartrate (LOPRESSOR) 25 MG tablet Take 25 mg by mouth 2 (two) times daily. May take additional 25 mg if needed for heart rate.    [provider]  mirtazapine (REMERON) 15 MG tablet Take 15 mg by mouth at bedtime.    [provider]  Multiple Vitamins-Minerals (PRESERVISION AREDS 2 PO) Take 1 tablet by mouth 2 (two) times daily.    [provider]  Probiotic Product (PROBIOTIC DAILY PO) Take 1 tablet by mouth daily.     [provider]  RABEprazole (ACIPHEX) 20 MG tablet Take 1 tablet (20 mg total) by mouth 2 (two) times daily. 12/11/16   Gladstone Lighter, MD  simethicone (GAS-X) 80 MG chewable tablet Chew 1 tablet (80 mg total) by mouth 4 (four) times daily as needed for flatulence. 02/15/18 02/15/19  Dustin Flock, MD  traMADol (ULTRAM) 50 MG tablet Take 1-2 tablets (50-100 mg total) by mouth every 4 (four) hours as needed for moderate pain. 02/28/16   Watt Climes, PA  vitamin B-12 (CYANOCOBALAMIN) 1000 MCG tablet Take 1,000 mcg by mouth at bedtime.     [provider]  warfarin (COUMADIN) 3 MG tablet Take 3.5 mg by mouth at bedtime.  01/12/18   [provider]  Wheat  Dextrin (BENEFIBER DRINK MIX PO) Take 1 Dose by mouth every other day.    [provider]    Allergies Alendronate sodium; Amoxicillin-pot clavulanate; Etodolac; Ibandronate sodium; Iodine; Lactose intolerance (gi); Minocycline hcl; Risedronate sodium; Sulfonamide derivatives; and Zoledronic acid  Family History  Problem Relation Age of Onset  . Heart disease Mother   . Stroke Father   . Heart disease Father   . Breast cancer Sister        x 2, in their 60's  . Lung cancer Brother   . Colon cancer Neg Hx   .  Ovarian cancer Neg Hx     Social History Social History   Tobacco Use  . Smoking status: Never Smoker  . Smokeless tobacco: Never Used  Substance Use Topics  . Alcohol use: No    Alcohol/week: 0.0 standard drinks  . Drug use: No    Review of Systems Constitutional: No fever/chills. Positive for generalized weakness.  Eyes: No visual changes. ENT: No sore throat. Cardiovascular: Denies chest pain. Respiratory: Denies shortness of breath. Gastrointestinal: No abdominal pain. Positive for diarrhea. Genitourinary: Negative for dysuria. Musculoskeletal: Negative for back pain. Skin: Negative for rash. Neurological: Negative for headaches, focal weakness or numbness.  ____________________________________________   PHYSICAL EXAM:  VITAL SIGNS: ED Triage Vitals  Enc Vitals Group     BP 09/27/18 1618 116/76     Pulse Rate 09/27/18 1618 65     Resp 09/27/18 1618 15     Temp 09/27/18 1618 98.2 F (36.8 C)     Temp Source 09/27/18 1618 Oral     SpO2 09/27/18 1618 97 %     Weight 09/27/18 1616 109 lb (49.4 kg)     Height 09/27/18 1616 4\' 8"  (1.422 m)     Head Circumference --      Peak Flow --      Pain Score 09/27/18 1615 0   Constitutional: Alert and oriented.  Eyes: Conjunctivae are normal.  ENT      Head: Normocephalic and atraumatic.      Nose: No congestion/rhinnorhea.      Mouth/Throat: Mucous membranes are moist.      Neck: No  stridor. Hematological/Lymphatic/Immunilogical: No cervical lymphadenopathy. Cardiovascular: Normal rate, regular rhythm.  No murmurs, rubs, or gallops Respiratory: Normal respiratory effort without tachypnea nor retractions. Breath sounds are clear and equal bilaterally. No wheezes/rales/rhonchi. Gastrointestinal: Soft and non tender. No rebound. No guarding.  Genitourinary: Deferred Musculoskeletal: Normal range of motion in all extremities. No lower extremity edema. Neurologic:  Normal speech and language. No gross focal neurologic deficits are appreciated.  Skin:  Skin is warm, dry and intact. No rash noted. Psychiatric: Mood and affect are normal. Speech and behavior are normal. Patient exhibits appropriate insight and judgment.  ____________________________________________    LABS (pertinent positives/negatives)  CBC wbc 7.2, hgb 12.5, plt 269 CMP wnl except glu 144, cr 1.04 Lipase 37 UA clear, small leukocytes, 21-50 wbcs, 0-5 squamous epi  ____________________________________________   EKG  None  ____________________________________________    RADIOLOGY  None  ____________________________________________   PROCEDURES  Procedures  ____________________________________________   INITIAL IMPRESSION / ASSESSMENT AND PLAN / ED COURSE  Pertinent labs & imaging results that were available during my care of the patient were reviewed by me and considered in my medical decision making (see chart for details).   Patient presented to the emergency department today because of concerns for some diarrhea as well as weakness. The patient's work-up is concerning for possible urinary tract infection.  No concerning electrolyte abnormality or anemia.  Patient did receive IV fluids.  Will give patient first dose of antibiotics here for the urinary tract infection.  Discussed findings and plan with patient and family.  ____________________________________________   FINAL  CLINICAL IMPRESSION(S) / ED DIAGNOSES  Final diagnoses:  Weakness  Dehydration  Lower urinary tract infectious disease     Note: This dictation was prepared with Dragon dictation. Any transcriptional errors that result from this process are unintentional     Nance Pear, MD 09/27/18 1949

## 2018-09-27 NOTE — ED Notes (Signed)
Pt's dosage of coumadin was increased last week to 4mg  from 3.5 mg.

## 2018-09-27 NOTE — ED Notes (Signed)
.  Pt discharged home with daughter, legal guardian, after verbalizing understanding of discharge instructions; nad noted.

## 2018-09-27 NOTE — Discharge Instructions (Addendum)
Please seek medical attention for any high fevers, chest pain, shortness of breath, change in behavior, persistent vomiting, bloody stool or any other new or concerning symptoms.  

## 2018-09-27 NOTE — ED Notes (Signed)
Pt presents with c/o "constipation" yesterday (she had to strain and it was a "hard ball").  She then took a dose of benefiber last night and has had 3-4 loose stools today. Daughter states she was lightheaded yesterday and then again today. She states she was off-balance as well. Pt states that she doesn't feel lightheaded, just "weak." Pt alert & oriented with NAD noted.

## 2018-09-27 NOTE — ED Triage Notes (Signed)
Pt c/o constipation yesterday and weakness after straining to use restroom - today c/o diarrhea x24 hours (3-4 times today) - pt c/o nausea

## 2018-09-27 NOTE — ED Notes (Signed)
Pt ambulatory to the bathroom 

## 2018-09-29 LAB — URINE CULTURE: Culture: NO GROWTH

## 2019-01-17 ENCOUNTER — Emergency Department: Payer: Medicare Other

## 2019-01-17 ENCOUNTER — Observation Stay
Admission: EM | Admit: 2019-01-17 | Discharge: 2019-01-18 | Disposition: A | Discharge status: Home / Self Care | Payer: Medicare Other | Attending: Internal Medicine | Admitting: Internal Medicine

## 2019-01-17 ENCOUNTER — Other Ambulatory Visit: Payer: Self-pay

## 2019-01-17 ENCOUNTER — Encounter: Payer: Self-pay | Admitting: Medical Oncology

## 2019-01-17 DIAGNOSIS — Z9012 Acquired absence of left breast and nipple: Secondary | ICD-10-CM | POA: Diagnosis not present

## 2019-01-17 DIAGNOSIS — E785 Hyperlipidemia, unspecified: Secondary | ICD-10-CM | POA: Insufficient documentation

## 2019-01-17 DIAGNOSIS — I482 Chronic atrial fibrillation, unspecified: Secondary | ICD-10-CM | POA: Insufficient documentation

## 2019-01-17 DIAGNOSIS — M4184 Other forms of scoliosis, thoracic region: Secondary | ICD-10-CM | POA: Diagnosis not present

## 2019-01-17 DIAGNOSIS — I10 Essential (primary) hypertension: Secondary | ICD-10-CM | POA: Diagnosis not present

## 2019-01-17 DIAGNOSIS — I700 Atherosclerosis of aorta: Secondary | ICD-10-CM | POA: Diagnosis not present

## 2019-01-17 DIAGNOSIS — Z7901 Long term (current) use of anticoagulants: Secondary | ICD-10-CM | POA: Diagnosis not present

## 2019-01-17 DIAGNOSIS — K219 Gastro-esophageal reflux disease without esophagitis: Secondary | ICD-10-CM | POA: Insufficient documentation

## 2019-01-17 DIAGNOSIS — J90 Pleural effusion, not elsewhere classified: Secondary | ICD-10-CM | POA: Insufficient documentation

## 2019-01-17 DIAGNOSIS — I2510 Atherosclerotic heart disease of native coronary artery without angina pectoris: Secondary | ICD-10-CM | POA: Diagnosis not present

## 2019-01-17 DIAGNOSIS — Z8673 Personal history of transient ischemic attack (TIA), and cerebral infarction without residual deficits: Secondary | ICD-10-CM | POA: Insufficient documentation

## 2019-01-17 DIAGNOSIS — I252 Old myocardial infarction: Secondary | ICD-10-CM | POA: Insufficient documentation

## 2019-01-17 DIAGNOSIS — M1990 Unspecified osteoarthritis, unspecified site: Secondary | ICD-10-CM | POA: Diagnosis not present

## 2019-01-17 DIAGNOSIS — F419 Anxiety disorder, unspecified: Secondary | ICD-10-CM | POA: Insufficient documentation

## 2019-01-17 DIAGNOSIS — Z79899 Other long term (current) drug therapy: Secondary | ICD-10-CM | POA: Insufficient documentation

## 2019-01-17 DIAGNOSIS — Z853 Personal history of malignant neoplasm of breast: Secondary | ICD-10-CM | POA: Diagnosis not present

## 2019-01-17 DIAGNOSIS — Z7982 Long term (current) use of aspirin: Secondary | ICD-10-CM | POA: Insufficient documentation

## 2019-01-17 DIAGNOSIS — R0602 Shortness of breath: Secondary | ICD-10-CM

## 2019-01-17 DIAGNOSIS — Z23 Encounter for immunization: Secondary | ICD-10-CM | POA: Diagnosis not present

## 2019-01-17 DIAGNOSIS — S32019A Unspecified fracture of first lumbar vertebra, initial encounter for closed fracture: Secondary | ICD-10-CM | POA: Diagnosis not present

## 2019-01-17 DIAGNOSIS — S2241XA Multiple fractures of ribs, right side, initial encounter for closed fracture: Principal | ICD-10-CM | POA: Insufficient documentation

## 2019-01-17 DIAGNOSIS — W19XXXA Unspecified fall, initial encounter: Secondary | ICD-10-CM | POA: Diagnosis not present

## 2019-01-17 DIAGNOSIS — K5730 Diverticulosis of large intestine without perforation or abscess without bleeding: Secondary | ICD-10-CM | POA: Diagnosis not present

## 2019-01-17 DIAGNOSIS — M47814 Spondylosis without myelopathy or radiculopathy, thoracic region: Secondary | ICD-10-CM | POA: Insufficient documentation

## 2019-01-17 DIAGNOSIS — S2239XA Fracture of one rib, unspecified side, initial encounter for closed fracture: Secondary | ICD-10-CM | POA: Diagnosis present

## 2019-01-17 LAB — COMPREHENSIVE METABOLIC PANEL
ALK PHOS: 118 U/L (ref 38–126)
ALT: 49 U/L — ABNORMAL HIGH (ref 0–44)
ANION GAP: 9 (ref 5–15)
AST: 65 U/L — ABNORMAL HIGH (ref 15–41)
Albumin: 3.7 g/dL (ref 3.5–5.0)
BUN: 18 mg/dL (ref 8–23)
CO2: 24 mmol/L (ref 22–32)
Calcium: 9 mg/dL (ref 8.9–10.3)
Chloride: 105 mmol/L (ref 98–111)
Creatinine, Ser: 1.14 mg/dL — ABNORMAL HIGH (ref 0.44–1.00)
GFR calc non Af Amer: 43 mL/min — ABNORMAL LOW (ref >60)
GFR, EST AFRICAN AMERICAN: 50 mL/min — AB (ref >60)
Glucose, Bld: 193 mg/dL — ABNORMAL HIGH (ref 70–99)
Potassium: 3.9 mmol/L (ref 3.5–5.1)
Sodium: 138 mmol/L (ref 135–145)
TOTAL PROTEIN: 7.3 g/dL (ref 6.5–8.1)
Total Bilirubin: 0.8 mg/dL (ref 0.3–1.2)

## 2019-01-17 LAB — CBC WITH DIFFERENTIAL/PLATELET
Abs Immature Granulocytes: 0.02 10*3/uL (ref 0.00–0.07)
BASOS ABS: 0 10*3/uL (ref 0.0–0.1)
Basophils Relative: 1 %
EOS ABS: 0.1 10*3/uL (ref 0.0–0.5)
EOS PCT: 2 %
HCT: 36.7 % (ref 36.0–46.0)
HEMOGLOBIN: 11.9 g/dL — AB (ref 12.0–15.0)
Immature Granulocytes: 0 %
LYMPHS ABS: 1.2 10*3/uL (ref 0.7–4.0)
LYMPHS PCT: 19 %
MCH: 30.7 pg (ref 26.0–34.0)
MCHC: 32.4 g/dL (ref 30.0–36.0)
MCV: 94.8 fL (ref 80.0–100.0)
MONO ABS: 0.8 10*3/uL (ref 0.1–1.0)
MONOS PCT: 13 %
NRBC: 0 % (ref 0.0–0.2)
Neutro Abs: 3.9 10*3/uL (ref 1.7–7.7)
Neutrophils Relative %: 65 %
Platelets: 215 10*3/uL (ref 150–400)
RBC: 3.87 MIL/uL (ref 3.87–5.11)
RDW: 13.2 % (ref 11.5–15.5)
WBC: 6 10*3/uL (ref 4.0–10.5)

## 2019-01-17 LAB — PROTIME-INR
INR: 2.99
Prothrombin Time: 30.6 seconds — ABNORMAL HIGH (ref 11.4–15.2)

## 2019-01-17 LAB — TYPE AND SCREEN
ABO/RH(D): A POS
Antibody Screen: NEGATIVE

## 2019-01-17 MED ORDER — FENTANYL CITRATE (PF) 100 MCG/2ML IJ SOLN: 25.0000 ug | INTRAMUSCULAR | Status: DC | PRN

## 2019-01-17 MED ORDER — PNEUMOCOCCAL VAC POLYVALENT 25 MCG/0.5ML IJ INJ
0.5000 mL | INJECTION | INTRAMUSCULAR | Status: AC
  Administered 2019-01-18: 0.5 mL via INTRAMUSCULAR
  Filled 2019-01-17: qty 0.5

## 2019-01-17 MED ORDER — ACETAMINOPHEN 325 MG PO TABS
650.0000 mg | ORAL_TABLET | Freq: Once | ORAL | Status: AC
  Administered 2019-01-17: 650 mg via ORAL
  Filled 2019-01-17: qty 2

## 2019-01-17 MED ORDER — HYDROCODONE-ACETAMINOPHEN 5-325 MG PO TABS: 1.0000 | ORAL_TABLET | Freq: Four times a day (QID) | ORAL | Status: DC | PRN

## 2019-01-17 MED ORDER — TRAMADOL HCL 50 MG PO TABS
50.0000 mg | ORAL_TABLET | Freq: Four times a day (QID) | ORAL | Status: DC | PRN
  Administered 2019-01-17 – 2019-01-18 (×2): 50 mg via ORAL
  Filled 2019-01-17 (×2): qty 1

## 2019-01-17 MED ORDER — LIDOCAINE 5 % EX PTCH
1.0000 | MEDICATED_PATCH | CUTANEOUS | Status: DC
  Administered 2019-01-17 – 2019-01-18 (×2): 1 via TRANSDERMAL
  Filled 2019-01-17 (×2): qty 1

## 2019-01-17 MED ORDER — ACETAMINOPHEN 650 MG RE SUPP: 650.0000 mg | Freq: Four times a day (QID) | RECTAL | Status: DC | PRN

## 2019-01-17 MED ORDER — WARFARIN SODIUM 3 MG PO TABS
3.5000 mg | ORAL_TABLET | Freq: Once | ORAL | Status: AC
  Administered 2019-01-17: 3.5 mg via ORAL
  Filled 2019-01-17: qty 1

## 2019-01-17 MED ORDER — IOHEXOL 300 MG/ML  SOLN
75.0000 mL | Freq: Once | INTRAMUSCULAR | Status: AC | PRN
  Administered 2019-01-17: 75 mL via INTRAVENOUS

## 2019-01-17 MED ORDER — WARFARIN - PHARMACIST DOSING INPATIENT
Freq: Every day | Status: DC
  Administered 2019-01-17: 18:00:00
  Filled 2019-01-17: qty 1

## 2019-01-17 MED ORDER — LIDOCAINE 5 % EX PTCH
1.0000 | MEDICATED_PATCH | CUTANEOUS | Status: DC
  Administered 2019-01-17: 1 via TRANSDERMAL
  Filled 2019-01-17 (×2): qty 1

## 2019-01-17 MED ORDER — HYDROCODONE-ACETAMINOPHEN 5-325 MG PO TABS
1.0000 | ORAL_TABLET | Freq: Once | ORAL | Status: AC
  Administered 2019-01-17: 1 via ORAL
  Filled 2019-01-17: qty 1

## 2019-01-17 MED ORDER — ACETAMINOPHEN 325 MG PO TABS
650.0000 mg | ORAL_TABLET | Freq: Four times a day (QID) | ORAL | Status: DC | PRN
  Administered 2019-01-17 – 2019-01-18 (×2): 650 mg via ORAL
  Filled 2019-01-17 (×2): qty 2

## 2019-01-17 NOTE — Consult Note (Signed)
ANTICOAGULATION CONSULT NOTE - Initial Consult  Pharmacy Consult for Warfarin Indication: atrial fibrillation  Allergies  Allergen Reactions  . Alendronate Sodium     REACTION: questionable  . Amoxicillin-Pot Clavulanate   . Etodolac   . Ibandronate Sodium     REACTION: questionable  . Iodine     Other reaction(s): Unknown  . Lactose Intolerance (Gi) Other (See Comments)    GI problems  . Minocycline Hcl   . Risedronate Sodium   . Sulfonamide Derivatives Other (See Comments)    Stomach pain  . Zoledronic Acid     REACTION: "dental problems"    Patient Measurements: Height: 4\' 9"  (144.8 cm) Weight: 109 lb (49.4 kg) IBW/kg (Calculated) : 38.6   Vital Signs: Temp: 98.4 F (36.9 C) (01/27 1030) Temp Source: Oral (01/27 1030) BP: 114/58 (01/27 1500) Pulse Rate: 64 (01/27 1500)  Labs: Recent Labs    01/17/19 1039 01/17/19 1136  HGB 11.9*  --   HCT 36.7  --   PLT 215  --   LABPROT  --  30.6*  INR  --  2.99  CREATININE 1.14*  --     Estimated Creatinine Clearance: 23.1 mL/min (A) (by C-G formula based on SCr of 1.14 mg/dL (H)).   Medical History: Past Medical History:  Diagnosis Date  . Allergy   . Anginal pain ([place])    c/o angina on May 04, 2016 releived by Nitroglycerin X 2  . Anxiety   . Arthritis   . Atrial fibrillation ([place])   . Atrial fibrillation ([place])   . Atrial fibrillation with rapid ventricular response [facility]) November 2015   Heart rate up to 140 requiring IV Cardizem  . Breast cancer [facility]) 2012   Patient underwent a left mastectomy on 03-31-11. Pathology showed DCIS at both sites without an invasive component. Sentinel nodes were negative. The breast lesions were noted to be ER +40%, PR-positive, 5%.   . Breast screening, unspecified   . Coronary artery disease   . Diverticulosis   . Dysrhythmia   . Essential hypertension, benign   . GERD (gastroesophageal reflux disease)   . Hearing disorder 2012   hearing aids  . Heart attack ([place]) 2001   . Heart disease 2001  . Hip pain   . Hyperlipidemia   . Hypertension 1996  . Lump or mass in breast   . Malignant neoplasm of upper-inner quadrant of female breast ([place])   . Malignant neoplasm of upper-outer quadrant of female breast ([place])   . Other benign neoplasm of connective and other soft tissue of thorax   . Personal history of malignant neoplasm of breast   . Reflux    acid reflux  . Shortness of breath dyspnea    with exertion  . Special screening for malignant neoplasms, colon   . Stroke [facility]) 2009  . Unspecified essential hypertension     Medications:  Pt home dose of warfarin is 3.5mg  @ bedtime  Assessment: Pt INR is 2.99   Goal of Therapy:  INR 2-3 Heparin level 0.3-0.7 units/ml Monitor platelets by anticoagulation protocol: Yes   Plan:  Will continue home dose of 3.5mg  this evening and continue to follow CBC/INR daily to assess appropriate dosing.  Lu Duffel, PharmD, BCPS Clinical Pharmacist 01/17/2019 3:17 PM

## 2019-01-17 NOTE — Care Management Obs Status (Signed)
[place] NOTIFICATION   Patient Details  Name: Peggy [name] MRN: [id number] Date of Birth: [birth year]   Medicare Observation Status Notification Given:  Yes    Shelbie Hutching, RN 01/17/2019, 3:39 PM

## 2019-01-17 NOTE — ED Triage Notes (Signed)
Pt from [facility] via ems with reports of mechanical fall on Saturday. Pt fell and landed on rt side back/rib cage. Pt reports difficulty taking deep breath d/t pain. Bruising noted to back. Pt A/O x 4.

## 2019-01-17 NOTE — ED Notes (Signed)
Patient educated on incentive spirometer.  

## 2019-01-17 NOTE — ED Notes (Signed)
ED TO INPATIENT HANDOFF REPORT  ED Nurse Name and Phone #: Elmo Putt [phone]  Name/Age/Gender Peggy [name] 85 y.o. female Room/Bed: ED09A/ED09A  Code Status   Code Status: Prior  Home/SNF/Other [facility] Patient A&Ox4 Is this baseline? Yes   Triage Complete: Triage complete  Chief Complaint side pain  Triage Note Pt from [facility] via ems with reports of mechanical fall on Saturday. Pt fell and landed on rt side back/rib cage. Pt reports difficulty taking deep breath d/t pain. Bruising noted to back. Pt A/O x 4.    Allergies Allergies  Allergen Reactions  . Alendronate Sodium     REACTION: questionable  . Amoxicillin-Pot Clavulanate   . Etodolac   . Ibandronate Sodium     REACTION: questionable  . Iodine     Other reaction(s): Unknown  . Lactose Intolerance (Gi) Other (See Comments)    GI problems  . Minocycline Hcl   . Risedronate Sodium   . Sulfonamide Derivatives Other (See Comments)    Stomach pain  . Zoledronic Acid     REACTION: "dental problems"    Level of Care/Admitting Diagnosis ED Disposition    ED Disposition Condition [place]: [place] [100120]  Level of Care: Med-Surg [16]  Diagnosis: Rib fracture [856314]  Admitting Physician: Odessa Fleming  Attending Physician: Fritzi Mandes [[id number]]  PT Class (Do Not Modify): Observation [104]  PT Acc Code (Do Not Modify): Observation [10022]       Medical/Surgery History Past Medical History:  Diagnosis Date  . Allergy   . Anginal pain ([place])    c/o angina on May 04, 2016 releived by Nitroglycerin X 2  . Anxiety   . Arthritis   . Atrial fibrillation ([place])   . Atrial fibrillation ([place])   . Atrial fibrillation with rapid ventricular response [facility]) November 2015   Heart rate up to 140 requiring IV Cardizem  . Breast cancer [facility]) 2012   Patient underwent a left mastectomy on 03-31-11. Pathology showed DCIS at both  sites without an invasive component. Sentinel nodes were negative. The breast lesions were noted to be ER +40%, PR-positive, 5%.   . Breast screening, unspecified   . Coronary artery disease   . Diverticulosis   . Dysrhythmia   . Essential hypertension, benign   . GERD (gastroesophageal reflux disease)   . Hearing disorder 2012   hearing aids  . Heart attack ([place]) 2001  . Heart disease 2001  . Hip pain   . Hyperlipidemia   . Hypertension 1996  . Lump or mass in breast   . Malignant neoplasm of upper-inner quadrant of female breast ([place])   . Malignant neoplasm of upper-outer quadrant of female breast ([place])   . Other benign neoplasm of connective and other soft tissue of thorax   . Personal history of malignant neoplasm of breast   . Reflux    acid reflux  . Shortness of breath dyspnea    with exertion  . Special screening for malignant neoplasms, colon   . Stroke [facility]) 2009  . Unspecified essential hypertension    Past Surgical History:  Procedure Laterality Date  . ABDOMINAL HYSTERECTOMY     age 71  . APPENDECTOMY     age 17  . BREAST BIOPSY     left breast biopsy over 15 years ago   . CHOLECYSTECTOMY     40 years ago   . COLON SURGERY  Dr. Tamala Julian, [facility]  . COLONOSCOPY  2009   [facility] Dr. Donnella Sham  . EXCISION OF BACK LESION Left 05/09/2016   Procedure: EXCISION OF GLUTEAL MASS;  Surgeon: Robert Bellow, MD;  Location: [facility] ORS;  Service: General;  Laterality: Left;  . EYE SURGERY Bilateral    Cataract Extraction with IOL  . HERNIA REPAIR    . JOINT REPLACEMENT Right    TOTAL HIP REPLACEMENT  . MASTECTOMY  2012   left breast  . SMALL INTESTINE SURGERY  12/31/2012   Abdominal exploration, lysis of adhesions for distal small bowel obstruction. Rochel Brome, MD  . TONSILLECTOMY     as a child  . TOTAL HIP ARTHROPLASTY Right 02/27/2016   Procedure: TOTAL HIP ARTHROPLASTY;  Surgeon: Dereck Leep, MD;  Location: [facility] ORS;  Service: Orthopedics;  Laterality: Right;  .  UPPER GI ENDOSCOPY  2009   [facility] Dr. Donnella Sham     IV Location/Drains/Wounds Patient Lines/Drains/Airways Status   Active Line/Drains/Airways    Name:   Placement date:   Placement time:   Site:   Days:   Peripheral IV 01/17/19 Right Hand   01/17/19    1042    Hand   less than 1          Intake/Output Last 24 hours No intake or output data in the 24 hours ending 01/17/19 1516  Labs/Imaging Results for orders placed or performed during the hospital encounter of 01/17/19 (from the past 48 hour(s))  CBC with Differential/Platelet     Status: Abnormal   Collection Time: 01/17/19 10:39 AM  Result Value Ref Range   WBC 6.0 4.0 - 10.5 K/uL   RBC 3.87 3.87 - 5.11 MIL/uL   Hemoglobin 11.9 (L) 12.0 - 15.0 g/dL   HCT 36.7 36.0 - 46.0 %   MCV 94.8 80.0 - 100.0 fL   MCH 30.7 26.0 - 34.0 pg   MCHC 32.4 30.0 - 36.0 g/dL   RDW 13.2 11.5 - 15.5 %   Platelets 215 150 - 400 K/uL   nRBC 0.0 0.0 - 0.2 %   Neutrophils Relative % 65 %   Neutro Abs 3.9 1.7 - 7.7 K/uL   Lymphocytes Relative 19 %   Lymphs Abs 1.2 0.7 - 4.0 K/uL   Monocytes Relative 13 %   Monocytes Absolute 0.8 0.1 - 1.0 K/uL   Eosinophils Relative 2 %   Eosinophils Absolute 0.1 0.0 - 0.5 K/uL   Basophils Relative 1 %   Basophils Absolute 0.0 0.0 - 0.1 K/uL   Immature Granulocytes 0 %   Abs Immature Granulocytes 0.02 0.00 - 0.07 K/uL    Comment: Performed at [facility], [address] [place], [place] [address]  Comprehensive metabolic panel     Status: Abnormal   Collection Time: 01/17/19 10:39 AM  Result Value Ref Range   Sodium 138 135 - 145 mmol/L   Potassium 3.9 3.5 - 5.1 mmol/L   Chloride 105 98 - 111 mmol/L   CO2 24 22 - 32 mmol/L   Glucose, Bld 193 (H) 70 - 99 mg/dL   BUN 18 8 - 23 mg/dL   Creatinine, Ser 1.14 (H) 0.44 - 1.00 mg/dL   Calcium 9.0 8.9 - 10.3 mg/dL   Total Protein 7.3 6.5 - 8.1 g/dL   Albumin 3.7 3.5 - 5.0 g/dL   AST 65 (H) 15 - 41 U/L   ALT 49 (H) 0 - 44 U/L   Alkaline Phosphatase  118 38 - 126 U/L  Total Bilirubin 0.8 0.3 - 1.2 mg/dL   GFR calc non Af Amer 43 (L) >60 mL/min   GFR calc Af Amer 50 (L) >60 mL/min   Anion gap 9 5 - 15    Comment: Performed at [facility], [address] [place], [place] [address]  Protime-INR     Status: Abnormal   Collection Time: 01/17/19 11:36 AM  Result Value Ref Range   Prothrombin Time 30.6 (H) 11.4 - 15.2 seconds   INR 2.99     Comment: Performed at [facility], [address] [place], [place] [address]  Type and screen [place]     Status: None   Collection Time: 01/17/19 11:36 AM  Result Value Ref Range   ABO/RH(D) A POS    Antibody Screen NEG    Sample Expiration      01/20/2019 Performed at [facility], [address] [place],  [address]    Ct Chest W Contrast  Result Date: 01/17/2019 CLINICAL DATA:  Abdomen and chest trauma secondary to a fall 2 days ago. Right side back and rib cage pain. Bruising to the back. EXAM: CT CHEST, ABDOMEN, AND PELVIS WITH CONTRAST TECHNIQUE: Multidetector CT imaging of the chest, abdomen and pelvis was performed following the standard protocol during bolus administration of intravenous contrast. CONTRAST:  55mL OMNIPAQUE IOHEXOL 300 MG/ML  SOLN COMPARISON:  None. CT scan of the abdomen and pelvis dated 09/16/2017 and chest CT dated 09/29/2014 FINDINGS: CT CHEST FINDINGS Cardiovascular: Aortic atherosclerosis. Scattered coronary artery calcifications. Mediastinum/Nodes: No enlarged mediastinal, hilar, or axillary lymph nodes. Thyroid gland, trachea, and esophagus demonstrate no significant findings. Lungs/Pleura: Lungs are clear. No pneumothorax. Tiny right effusion posterior medially adjacent to multiple rib fractures. Musculoskeletal: There are fractures of the posteromedial aspects of the right eighth through twelfth ribs. Several of those ribs are broken in 2 locations posteriorly. There are old compression fractures of T8, T11,  and T12. Left mastectomy. CT ABDOMEN PELVIS FINDINGS Hepatobiliary: There is a chronic progressive intra and extrahepatic biliary ductal dilatation. Common bile duct is dilated to a diameter of 16 mm. The duct is dilated to the level of the ampulla. However, patient's bilirubin is not elevated. Cholecystectomy. Pancreas: There is a focal lobulated area of low-density in the body of the pancreas measuring 16 x 12 mm, unchanged in size. There is also a slightly exophytic 8 x 11 mm cystic lesion which probably arises from the pancreas and lies immediately deep to the superior mesenteric vein on image 55 of series 2. This is unchanged. The pancreas otherwise appears normal with no ductal dilatation. Spleen: No splenic injury or perisplenic hematoma.  Normal in size. Adrenals/Urinary Tract: Adrenal glands are unremarkable. Kidneys are normal, without renal calculi, focal lesion, or hydronephrosis. The bladder appears normal but is partially obscured by metallic artifact from the patient's right hip prosthesis. Stomach/Bowel: Stomach is within normal limits. Appendix has been removed. No evidence of bowel wall thickening, distention, or inflammatory changes. Diverticulosis of the sigmoid portion of the colon. Vascular/Lymphatic: There is aortic atherosclerosis with a focal slight dilatation of the abdominal aorta to a maximum diameter of 24 mm. No adenopathy. Reproductive: Status post hysterectomy. No adnexal masses. Other: No abdominal wall hernia or abnormality. No abdominopelvic ascites. Musculoskeletal: There are fractures of the right transverse process of L1 and L2 and L3. There is soft tissue stranding in the subcutaneous fat at the posterior aspect of the right flank felt to be secondary to the  adjacent rib and transverse process fractures. IMPRESSION: 1. Multiple acute right posterior rib fractures and multiple acute right lumbar spine transverse process fractures. 2. No acute intra-abdominal abnormalities. 3.  Tiny right pleural effusion adjacent to the rib fractures. 4. Chronic progressive most likely benign biliary ductal dilatation. 5. 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 These were not appreciable as lesions on the unenhanced CT scan of 2018 but those regions of the pancreas appear unchanged. 6. Focal slight dilatation of the abdominal aorta to a maximum diameter of 2.4 cm, unchanged since 2018. 7.  Aortic Atherosclerosis (ICD10-I70.0). Electronically Signed   By: Lorriane Shire M.D.   On: 01/17/2019 12:29   Ct Abdomen Pelvis W Contrast  Result Date: 01/17/2019 CLINICAL DATA:  Abdomen and chest trauma secondary to a fall 2 days ago. Right side back and rib cage pain. Bruising to the back. EXAM: CT CHEST, ABDOMEN, AND PELVIS WITH CONTRAST TECHNIQUE: Multidetector CT imaging of the chest, abdomen and pelvis was performed following the standard protocol during bolus administration of intravenous contrast. CONTRAST:  27mL OMNIPAQUE IOHEXOL 300 MG/ML  SOLN COMPARISON:  None. CT scan of the abdomen and pelvis dated 09/16/2017 and chest CT dated 09/29/2014 FINDINGS: CT CHEST FINDINGS Cardiovascular: Aortic atherosclerosis. Scattered coronary artery calcifications. Mediastinum/Nodes: No enlarged mediastinal, hilar, or axillary lymph nodes. Thyroid gland, trachea, and esophagus demonstrate no significant findings. Lungs/Pleura: Lungs are clear. No pneumothorax. Tiny right effusion posterior medially adjacent to multiple rib fractures. Musculoskeletal: There are fractures of the posteromedial aspects of the right eighth through twelfth ribs. Several of those ribs are broken in 2 locations posteriorly. There are old compression fractures of T8, T11, and T12. Left mastectomy. CT ABDOMEN PELVIS FINDINGS Hepatobiliary: There is a chronic progressive intra and extrahepatic biliary ductal dilatation. Common bile duct is dilated to a diameter of 16 mm. The duct is  dilated to the level of the ampulla. However, patient's bilirubin is not elevated. Cholecystectomy. Pancreas: There is a focal lobulated area of low-density in the body of the pancreas measuring 16 x 12 mm, unchanged in size. There is also a slightly exophytic 8 x 11 mm cystic lesion which probably arises from the pancreas and lies immediately deep to the superior mesenteric vein on image 55 of series 2. This is unchanged. The pancreas otherwise appears normal with no ductal dilatation. Spleen: No splenic injury or perisplenic hematoma.  Normal in size. Adrenals/Urinary Tract: Adrenal glands are unremarkable. Kidneys are normal, without renal calculi, focal lesion, or hydronephrosis. The bladder appears normal but is partially obscured by metallic artifact from the patient's right hip prosthesis. Stomach/Bowel: Stomach is within normal limits. Appendix has been removed. No evidence of bowel wall thickening, distention, or inflammatory changes. Diverticulosis of the sigmoid portion of the colon. Vascular/Lymphatic: There is aortic atherosclerosis with a focal slight dilatation of the abdominal aorta to a maximum diameter of 24 mm. No adenopathy. Reproductive: Status post hysterectomy. No adnexal masses. Other: No abdominal wall hernia or abnormality. No abdominopelvic ascites. Musculoskeletal: There are fractures of the right transverse process of L1 and L2 and L3. There is soft tissue stranding in the subcutaneous fat at the posterior aspect of the right flank felt to be secondary to the adjacent rib and transverse process fractures. IMPRESSION: 1. Multiple acute right posterior rib fractures and multiple acute right lumbar spine transverse process fractures. 2. No acute intra-abdominal abnormalities. 3. Tiny right pleural effusion adjacent to the rib fractures. 4. Chronic progressive most likely benign  biliary ductal dilatation. 5. Chronic low-density lesions in the pancreas most likely representing intraductal  mucinous neoplasms. These were not appreciable as lesions on the unenhanced CT scan of 2018 but those regions of the pancreas appear unchanged. 6. Focal slight dilatation of the abdominal aorta to a maximum diameter of 2.4 cm, unchanged since 2018. 7.  Aortic Atherosclerosis (ICD10-I70.0). Electronically Signed   By: Lorriane Shire M.D.   On: 01/17/2019 12:29    Pending Labs Unresulted Labs (From admission, onward)    Start     Ordered   01/18/19 0500  CBC  Tomorrow morning,   STAT     01/17/19 1513   01/18/19 0500  Protime-INR  Tomorrow morning,   STAT     01/17/19 1513   Signed and Held  CBC  (enoxaparin (LOVENOX)    CrCl >/= 30 ml/min)  Once,   R    Comments:  Baseline for enoxaparin therapy IF NOT ALREADY DRAWN.  Notify MD if PLT < 100 K.    Signed and Held   Signed and Held  Creatinine, serum  (enoxaparin (LOVENOX)    CrCl >/= 30 ml/min)  Once,   R    Comments:  Baseline for enoxaparin therapy IF NOT ALREADY DRAWN.    Signed and Held   Signed and Held  Creatinine, serum  (enoxaparin (LOVENOX)    CrCl >/= 30 ml/min)  Weekly,   R    Comments:  while on enoxaparin therapy    Signed and Held          Vitals/Pain Today's Vitals   01/17/19 1444 01/17/19 1445 01/17/19 1446 01/17/19 1500  BP:  (!) 119/59  (!) 114/58  Pulse:  63  64  Resp:  18  20  Temp:      TempSrc:      SpO2:  99%  94%  Weight:      Height:      PainSc: 0-No pain 0-No pain 0-No pain     Isolation Precautions No active isolations  Medications Medications  lidocaine (LIDODERM) 5 % 1 patch (1 patch Transdermal Patch Applied 01/17/19 1128)  fentaNYL (SUBLIMAZE) injection 25 mcg (has no administration in time range)  warfarin (COUMADIN) tablet 3.5 mg (has no administration in time range)  Warfarin - Pharmacist Dosing Inpatient (has no administration in time range)  iohexol (OMNIPAQUE) 300 MG/ML solution 75 mL (75 mLs Intravenous Contrast Given 01/17/19 1155)  HYDROcodone-acetaminophen (NORCO/VICODIN) 5-325  MG per tablet 1 tablet (1 tablet Oral Given 01/17/19 1328)  acetaminophen (TYLENOL) tablet 650 mg (650 mg Oral Given 01/17/19 1328)    Mobility walks Low fall risk   Recommendations: See Admitting Provider Note

## 2019-01-17 NOTE — H&P (Addendum)
[place] [place] NAME: Peggy [name]    MR#:  [id number]  DATE OF BIRTH:  [birth year]  DATE OF ADMISSION:  01/17/2019  PRIMARY CARE PHYSICIAN: Maryland Pink, MD   REQUESTING/REFERRING PHYSICIAN: Dr. Quentin Cornwall  CHIEF COMPLAINT:  Canticle fall and hurting on the right side.  HISTORY OF PRESENT ILLNESS:  Peggy [name]  is a 85 y.o. female with a known history of attention, atrial fibrillation, anxiety, hyperlipidemia comes to the emergency room from home place of [place] after patient had a mechanical fall and landed on the right side hitting the night table. Patient was trying to fix her bed this morning denies any dizziness or lightheadedness.  Coping the ER showed patient has acute right posterior rib fractures along with L1 to three transverse process fractures as well. Patient is being admitted for pain control. She is hemodynamically stable satire okay.  Two daughters present in the ER.  PAST MEDICAL HISTORY:   Past Medical History:  Diagnosis Date  . Allergy   . Anginal pain ([place])    c/o angina on May 04, 2016 releived by Nitroglycerin X 2  . Anxiety   . Arthritis   . Atrial fibrillation ([place])   . Atrial fibrillation ([place])   . Atrial fibrillation with rapid ventricular response [facility]) November 2015   Heart rate up to 140 requiring IV Cardizem  . Breast cancer [facility]) 2012   Patient underwent a left mastectomy on 03-31-11. Pathology showed DCIS at both sites without an invasive component. Sentinel nodes were negative. The breast lesions were noted to be ER +40%, PR-positive, 5%.   . Breast screening, unspecified   . Coronary artery disease   . Diverticulosis   . Dysrhythmia   . Essential hypertension, benign   . GERD (gastroesophageal reflux disease)   . Hearing disorder 2012   hearing aids  . Heart attack ([place]) 2001  . Heart disease 2001  . Hip pain   . Hyperlipidemia   . Hypertension 1996  . Lump or mass in  breast   . Malignant neoplasm of upper-inner quadrant of female breast ([place])   . Malignant neoplasm of upper-outer quadrant of female breast ([place])   . Other benign neoplasm of connective and other soft tissue of thorax   . Personal history of malignant neoplasm of breast   . Reflux    acid reflux  . Shortness of breath dyspnea    with exertion  . Special screening for malignant neoplasms, colon   . Stroke [facility]) 2009  . Unspecified essential hypertension     PAST SURGICAL HISTOIRY:   Past Surgical History:  Procedure Laterality Date  . ABDOMINAL HYSTERECTOMY     age 39  . APPENDECTOMY     age 52  . BREAST BIOPSY     left breast biopsy over 15 years ago   . CHOLECYSTECTOMY     40 years ago   . COLON SURGERY     Dr. Tamala Julian, [facility]  . COLONOSCOPY  2009   [facility] Dr. Donnella Sham  . EXCISION OF BACK LESION Left 05/09/2016   Procedure: EXCISION OF GLUTEAL MASS;  Surgeon: Robert Bellow, MD;  Location: [facility] ORS;  Service: General;  Laterality: Left;  . EYE SURGERY Bilateral    Cataract Extraction with IOL  . HERNIA REPAIR    . JOINT REPLACEMENT Right    TOTAL HIP REPLACEMENT  . MASTECTOMY  2012   left breast  . SMALL INTESTINE SURGERY  12/31/2012   Abdominal exploration, lysis of adhesions for distal small bowel obstruction. Rochel Brome, MD  . TONSILLECTOMY     as a child  . TOTAL HIP ARTHROPLASTY Right 02/27/2016   Procedure: TOTAL HIP ARTHROPLASTY;  Surgeon: Dereck Leep, MD;  Location: [facility] ORS;  Service: Orthopedics;  Laterality: Right;  . UPPER GI ENDOSCOPY  2009   [facility] Dr. Donnella Sham    SOCIAL HISTORY:   Social History   Tobacco Use  . Smoking status: Never Smoker  . Smokeless tobacco: Never Used  Substance Use Topics  . Alcohol use: No    Alcohol/week: 0.0 standard drinks    FAMILY HISTORY:   Family History  Problem Relation Age of Onset  . Heart disease Mother   . Stroke Father   . Heart disease Father   . Breast cancer Sister        x 2, in their 73's  .  Lung cancer Brother   . Colon cancer Neg Hx   . Ovarian cancer Neg Hx     DRUG ALLERGIES:   Allergies  Allergen Reactions  . Alendronate Sodium     REACTION: questionable  . Amoxicillin-Pot Clavulanate   . Etodolac   . Ibandronate Sodium     REACTION: questionable  . Iodine     Other reaction(s): Unknown  . Lactose Intolerance (Gi) Other (See Comments)    GI problems  . Minocycline Hcl   . Risedronate Sodium   . Sulfonamide Derivatives Other (See Comments)    Stomach pain  . Zoledronic Acid     REACTION: "dental problems"    REVIEW OF SYSTEMS:  Review of Systems  Constitutional: Negative for chills, fever and weight loss.  HENT: Negative for ear discharge, ear pain and nosebleeds.   Eyes: Negative for blurred vision, pain and discharge.  Respiratory: Negative for sputum production, shortness of breath, wheezing and stridor.   Cardiovascular: Positive for chest pain. Negative for palpitations, orthopnea and PND.  Gastrointestinal: Negative for abdominal pain, diarrhea, nausea and vomiting.  Genitourinary: Negative for frequency and urgency.  Musculoskeletal: Positive for back pain and falls. Negative for joint pain.  Neurological: Negative for sensory change, speech change, focal weakness and weakness.  Psychiatric/Behavioral: Negative for depression and hallucinations. The patient is not nervous/anxious.      MEDICATIONS AT HOME:   Prior to Admission medications   Medication Sig Start Date End Date Taking? Authorizing Provider  ALPRAZolam Duanne Moron) 0.25 MG tablet Take 0.25 mg by mouth at bedtime as needed for anxiety or sleep.     [provider]  aspirin EC 81 MG tablet Take 81 mg by mouth daily.    [provider]  Calcium Carbonate-Vitamin D (RA CALCIUM PLUS VITAMIN D) 600-400 MG-UNIT tablet Take 1 tablet by mouth 2 (two) times daily.    [provider]  cephALEXin (KEFLEX) 500 MG capsule Take 1 capsule (500 mg total) by mouth 3 (three)  times daily. 09/27/18   Nance Pear, MD  diltiazem (CARDIZEM CD) 300 MG 24 hr capsule Take 1 capsule (300 mg total) by mouth daily. 02/15/18 02/15/19  Dustin Flock, MD  escitalopram (LEXAPRO) 10 MG tablet Take 10 mg by mouth at bedtime.     [provider]  isosorbide mononitrate (IMDUR) 30 MG 24 hr tablet Take by mouth. 05/12/18 05/12/19  [provider]  metoprolol tartrate (LOPRESSOR) 25 MG tablet Take 25 mg by mouth 2 (two) times daily. May take additional 25 mg if needed for heart rate.  [provider]  mirtazapine (REMERON) 15 MG tablet Take 15 mg by mouth at bedtime.    [provider]  Multiple Vitamins-Minerals (PRESERVISION AREDS 2 PO) Take 1 tablet by mouth 2 (two) times daily.    [provider]  Probiotic Product (PROBIOTIC DAILY PO) Take 1 tablet by mouth daily.     [provider]  RABEprazole (ACIPHEX) 20 MG tablet Take 1 tablet (20 mg total) by mouth 2 (two) times daily. 12/11/16   Gladstone Lighter, MD  simethicone (GAS-X) 80 MG chewable tablet Chew 1 tablet (80 mg total) by mouth 4 (four) times daily as needed for flatulence. 02/15/18 02/15/19  Dustin Flock, MD  traMADol (ULTRAM) 50 MG tablet Take 1-2 tablets (50-100 mg total) by mouth every 4 (four) hours as needed for moderate pain. 02/28/16   Watt Climes, PA  vitamin B-12 (CYANOCOBALAMIN) 1000 MCG tablet Take 1,000 mcg by mouth at bedtime.     [provider]  warfarin (COUMADIN) 3 MG tablet Take 3.5 mg by mouth at bedtime.  01/12/18   [provider]  Wheat Dextrin (BENEFIBER DRINK MIX PO) Take 1 Dose by mouth every other day.    [provider]      VITAL SIGNS:  Blood pressure (!) 119/59, pulse 63, temperature 98.4 F (36.9 C), temperature source Oral, resp. rate 18, height 4\' 9"  (1.448 m), weight 49.4 kg, SpO2 99 %.  PHYSICAL EXAMINATION:  GENERAL:  85 y.o.-year-old patient lying in the bed with no acute distress.  EYES: Pupils  equal, round, reactive to light and accommodation. No scleral icterus. Extraocular muscles intact.  HEENT: Head atraumatic, normocephalic. Oropharynx and nasopharynx clear.  NECK:  Supple, no jugular venous distention. No thyroid enlargement, no tenderness.  LUNGS: Normal breath sounds bilaterally, no wheezing, rales,rhonchi or crepitation. No use of accessory muscles of respiration.  CARDIOVASCULAR: S1, S2 normal. No murmurs, rubs, or gallops.  ABDOMEN: Soft, nontender, nondistended. Bowel sounds present. No organomegaly or mass.  EXTREMITIES: No pedal edema, cyanosis, or clubbing.  NEUROLOGIC: Cranial nerves II through XII are intact. Muscle strength 5/5 in all extremities. Sensation intact. Gait not checked.  PSYCHIATRIC: The patient is alert and oriented x 3.  SKIN: No obvious rash, lesion, or ulcer.   LABORATORY PANEL:   CBC Recent Labs  Lab 01/17/19 1039  WBC 6.0  HGB 11.9*  HCT 36.7  PLT 215   ------------------------------------------------------------------------------------------------------------------  Chemistries  Recent Labs  Lab 01/17/19 1039  NA 138  K 3.9  CL 105  CO2 24  GLUCOSE 193*  BUN 18  CREATININE 1.14*  CALCIUM 9.0  AST 65*  ALT 49*  ALKPHOS 118  BILITOT 0.8   ------------------------------------------------------------------------------------------------------------------  Cardiac Enzymes No results for input(s): TROPONINI in the last 168 hours. ------------------------------------------------------------------------------------------------------------------  RADIOLOGY:  Ct Chest W Contrast  Result Date: 01/17/2019 CLINICAL DATA:  Abdomen and chest trauma secondary to a fall 2 days ago. Right side back and rib cage pain. Bruising to the back. EXAM: CT CHEST, ABDOMEN, AND PELVIS WITH CONTRAST TECHNIQUE: Multidetector CT imaging of the chest, abdomen and pelvis was performed following the standard protocol during bolus administration of  intravenous contrast. CONTRAST:  7mL OMNIPAQUE IOHEXOL 300 MG/ML  SOLN COMPARISON:  None. CT scan of the abdomen and pelvis dated 09/16/2017 and chest CT dated 09/29/2014 FINDINGS: CT CHEST FINDINGS Cardiovascular: Aortic atherosclerosis. Scattered coronary artery calcifications. Mediastinum/Nodes: No enlarged mediastinal, hilar, or axillary lymph nodes. Thyroid gland, trachea, and esophagus demonstrate no significant findings. Lungs/Pleura: Lungs  are clear. No pneumothorax. Tiny right effusion posterior medially adjacent to multiple rib fractures. Musculoskeletal: There are fractures of the posteromedial aspects of the right eighth through twelfth ribs. Several of those ribs are broken in 2 locations posteriorly. There are old compression fractures of T8, T11, and T12. Left mastectomy. CT ABDOMEN PELVIS FINDINGS Hepatobiliary: There is a chronic progressive intra and extrahepatic biliary ductal dilatation. Common bile duct is dilated to a diameter of 16 mm. The duct is dilated to the level of the ampulla. However, patient's bilirubin is not elevated. Cholecystectomy. Pancreas: There is a focal lobulated area of low-density in the body of the pancreas measuring 16 x 12 mm, unchanged in size. There is also a slightly exophytic 8 x 11 mm cystic lesion which probably arises from the pancreas and lies immediately deep to the superior mesenteric vein on image 55 of series 2. This is unchanged. The pancreas otherwise appears normal with no ductal dilatation. Spleen: No splenic injury or perisplenic hematoma.  Normal in size. Adrenals/Urinary Tract: Adrenal glands are unremarkable. Kidneys are normal, without renal calculi, focal lesion, or hydronephrosis. The bladder appears normal but is partially obscured by metallic artifact from the patient's right hip prosthesis. Stomach/Bowel: Stomach is within normal limits. Appendix has been removed. No evidence of bowel wall thickening, distention, or inflammatory changes.  Diverticulosis of the sigmoid portion of the colon. Vascular/Lymphatic: There is aortic atherosclerosis with a focal slight dilatation of the abdominal aorta to a maximum diameter of 24 mm. No adenopathy. Reproductive: Status post hysterectomy. No adnexal masses. Other: No abdominal wall hernia or abnormality. No abdominopelvic ascites. Musculoskeletal: There are fractures of the right transverse process of L1 and L2 and L3. There is soft tissue stranding in the subcutaneous fat at the posterior aspect of the right flank felt to be secondary to the adjacent rib and transverse process fractures. IMPRESSION: 1. Multiple acute right posterior rib fractures and multiple acute right lumbar spine transverse process fractures. 2. No acute intra-abdominal abnormalities. 3. Tiny right pleural effusion adjacent to the rib fractures. 4. Chronic progressive most likely benign biliary ductal dilatation. 5. 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 These were not appreciable as lesions on the unenhanced CT scan of 2018 but those regions of the pancreas appear unchanged. 6. Focal slight dilatation of the abdominal aorta to a maximum diameter of 2.4 cm, unchanged since 2018. 7.  Aortic Atherosclerosis (ICD10-I70.0). Electronically Signed   By: Lorriane Shire M.D.   On: 01/17/2019 12:29   Ct Abdomen Pelvis W Contrast  Result Date: 01/17/2019 CLINICAL DATA:  Abdomen and chest trauma secondary to a fall 2 days ago. Right side back and rib cage pain. Bruising to the back. EXAM: CT CHEST, ABDOMEN, AND PELVIS WITH CONTRAST TECHNIQUE: Multidetector CT imaging of the chest, abdomen and pelvis was performed following the standard protocol during bolus administration of intravenous contrast. CONTRAST:  42mL OMNIPAQUE IOHEXOL 300 MG/ML  SOLN COMPARISON:  None. CT scan of the abdomen and pelvis dated 09/16/2017 and chest CT dated 09/29/2014 FINDINGS: CT CHEST FINDINGS  Cardiovascular: Aortic atherosclerosis. Scattered coronary artery calcifications. Mediastinum/Nodes: No enlarged mediastinal, hilar, or axillary lymph nodes. Thyroid gland, trachea, and esophagus demonstrate no significant findings. Lungs/Pleura: Lungs are clear. No pneumothorax. Tiny right effusion posterior medially adjacent to multiple rib fractures. Musculoskeletal: There are fractures of the posteromedial aspects of the right eighth through twelfth ribs. Several of those ribs are broken in 2 locations posteriorly. There are old compression fractures  of T8, T11, and T12. Left mastectomy. CT ABDOMEN PELVIS FINDINGS Hepatobiliary: There is a chronic progressive intra and extrahepatic biliary ductal dilatation. Common bile duct is dilated to a diameter of 16 mm. The duct is dilated to the level of the ampulla. However, patient's bilirubin is not elevated. Cholecystectomy. Pancreas: There is a focal lobulated area of low-density in the body of the pancreas measuring 16 x 12 mm, unchanged in size. There is also a slightly exophytic 8 x 11 mm cystic lesion which probably arises from the pancreas and lies immediately deep to the superior mesenteric vein on image 55 of series 2. This is unchanged. The pancreas otherwise appears normal with no ductal dilatation. Spleen: No splenic injury or perisplenic hematoma.  Normal in size. Adrenals/Urinary Tract: Adrenal glands are unremarkable. Kidneys are normal, without renal calculi, focal lesion, or hydronephrosis. The bladder appears normal but is partially obscured by metallic artifact from the patient's right hip prosthesis. Stomach/Bowel: Stomach is within normal limits. Appendix has been removed. No evidence of bowel wall thickening, distention, or inflammatory changes. Diverticulosis of the sigmoid portion of the colon. Vascular/Lymphatic: There is aortic atherosclerosis with a focal slight dilatation of the abdominal aorta to a maximum diameter of 24 mm. No adenopathy.  Reproductive: Status post hysterectomy. No adnexal masses. Other: No abdominal wall hernia or abnormality. No abdominopelvic ascites. Musculoskeletal: There are fractures of the right transverse process of L1 and L2 and L3. There is soft tissue stranding in the subcutaneous fat at the posterior aspect of the right flank felt to be secondary to the adjacent rib and transverse process fractures. IMPRESSION: 1. Multiple acute right posterior rib fractures and multiple acute right lumbar spine transverse process fractures. 2. No acute intra-abdominal abnormalities. 3. Tiny right pleural effusion adjacent to the rib fractures. 4. Chronic progressive most likely benign biliary ductal dilatation. 5. 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 These were not appreciable as lesions on the unenhanced CT scan of 2018 but those regions of the pancreas appear unchanged. 6. Focal slight dilatation of the abdominal aorta to a maximum diameter of 2.4 cm, unchanged since 2018. 7.  Aortic Atherosclerosis (ICD10-I70.0). Electronically Signed   By: Lorriane Shire M.D.   On: 01/17/2019 12:29    EKG:    IMPRESSION AND PLAN:   Glenn Christo  is a 85 y.o. female with a known history of attention, atrial fibrillation, anxiety, hyperlipidemia comes to the emergency room from home place of [place] after patient had a mechanical fall and landed on the right side hitting the night table. Patient was trying to fix her bed this morning denies any dizziness or lightheadedness.  1. Acute right rib fracture with lumbar L123 transverse process fracture status post mechanical fall -admit for pain control -Tylenol, PRN Ultram, PRN Norco, lidocaine patch -incentive spirometer -PT to see patient  2. Hypertension continue home meds  3. Chronic anxiety continue Xanax  4. Chronic atrial fibrillation on Coumadin. I noticed 2.9. Will continue Coumadin. No evidence of bleeding  noted on CT chest or abdomen post fall.    All the records are reviewed and case discussed with ED provider.   CODE STATUS: full  TOTAL TIME TAKING CARE OF THIS PATIENT: *50** minutes.    Fritzi Mandes M.D on 01/17/2019 at 3:03 PM  Between 7am to 6pm - Pager - [phone]  After 6pm go to [email or web address] - password [email or web address] [facility]  [facility]  Office  [phone]  CC: Primary care physician;  [place] Pink, MD

## 2019-01-17 NOTE — Progress Notes (Signed)
Family Meeting Note  Patient came in with mechanical fall at home place of [place]. She has sustained rib fracture and acute transfers process fracture from L1 to L3. She is being admitted for pain control. Code status discussed with patient daughter and they want patient to be full code. The if they change their mind after discussing with the patient there let me know.  Time spent 16 Herby Abraham, MD

## 2019-01-17 NOTE — ED Notes (Signed)
Care management in room to assess patient in room at this time.

## 2019-01-17 NOTE — ED Provider Notes (Signed)
[facility] Emergency Department Provider Note    First MD Initiated Contact with Patient 01/17/19 1047     (approximate)  I have reviewed the triage vital signs and the nursing notes.   HISTORY  Chief Complaint Fall; Chest Pain; and Shortness of Breath    HPI Peggy [name] is a 85 y.o. female presents the ER with the below listed past medical history for progressively worsening right chest wall pain.  Patient had mechanical fall over the weekend.   Was seen in walk-in clinic where chest x-rays that were reassuring.  She is discharged home with pain medication.  Since then has developed bruising and worsening shortness of breath and pleuritic chest pain.  Also developing right upper quadrant pain.  She is on Coumadin for history of A. fib and PE.   Past Medical History:  Diagnosis Date  . Allergy   . Anginal pain ([place])    c/o angina on May 04, 2016 releived by Nitroglycerin X 2  . Anxiety   . Arthritis   . Atrial fibrillation ([place])   . Atrial fibrillation ([place])   . Atrial fibrillation with rapid ventricular response [facility]) November 2015   Heart rate up to 140 requiring IV Cardizem  . Breast cancer [facility]) 2012   Patient underwent a left mastectomy on 03-31-11. Pathology showed DCIS at both sites without an invasive component. Sentinel nodes were negative. The breast lesions were noted to be ER +40%, PR-positive, 5%.   . Breast screening, unspecified   . Coronary artery disease   . Diverticulosis   . Dysrhythmia   . Essential hypertension, benign   . GERD (gastroesophageal reflux disease)   . Hearing disorder 2012   hearing aids  . Heart attack ([place]) 2001  . Heart disease 2001  . Hip pain   . Hyperlipidemia   . Hypertension 1996  . Lump or mass in breast   . Malignant neoplasm of upper-inner quadrant of female breast ([place])   . Malignant neoplasm of upper-outer quadrant of female breast ([place])   . Other benign neoplasm of connective and other soft  tissue of thorax   . Personal history of malignant neoplasm of breast   . Reflux    acid reflux  . Shortness of breath dyspnea    with exertion  . Special screening for malignant neoplasms, colon   . Stroke [facility]) 2009  . Unspecified essential hypertension    Family History  Problem Relation Age of Onset  . Heart disease Mother   . Stroke Father   . Heart disease Father   . Breast cancer Sister        x 2, in their 73's  . Lung cancer Brother   . Colon cancer Neg Hx   . Ovarian cancer Neg Hx    Past Surgical History:  Procedure Laterality Date  . ABDOMINAL HYSTERECTOMY     age 17  . APPENDECTOMY     age 19  . BREAST BIOPSY     left breast biopsy over 15 years ago   . CHOLECYSTECTOMY     40 years ago   . COLON SURGERY     Dr. Tamala Julian, [facility]  . COLONOSCOPY  2009   [facility] Dr. Donnella Sham  . EXCISION OF BACK LESION Left 05/09/2016   Procedure: EXCISION OF GLUTEAL MASS;  Surgeon: Robert Bellow, MD;  Location: [facility] ORS;  Service: General;  Laterality: Left;  . EYE SURGERY Bilateral    Cataract Extraction with IOL  .  HERNIA REPAIR    . JOINT REPLACEMENT Right    TOTAL HIP REPLACEMENT  . MASTECTOMY  2012   left breast  . SMALL INTESTINE SURGERY  12/31/2012   Abdominal exploration, lysis of adhesions for distal small bowel obstruction. Rochel Brome, MD  . TONSILLECTOMY     as a child  . TOTAL HIP ARTHROPLASTY Right 02/27/2016   Procedure: TOTAL HIP ARTHROPLASTY;  Surgeon: Dereck Leep, MD;  Location: [facility] ORS;  Service: Orthopedics;  Laterality: Right;  . UPPER GI ENDOSCOPY  2009   [facility] Dr. Donnella Sham   Patient Active Problem List   Diagnosis Date Noted  . Rib fracture 01/17/2019  . Chest pain 02/14/2018  . Rectal bleed 09/01/2017  . Atrial flutter ([place]) 12/08/2016  . S/P total hip arthroplasty 02/27/2016  . Hip pain 01/15/2016  . Memory loss 03/21/2015  . Abnormality of gait 03/21/2015  . Atrial fibrillation with RVR ([place]) 03/11/2015  . H/O: CVA (cerebrovascular  accident) 03/11/2015  . 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 Blurred vision 09/05/2013  . 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 EPIGASTRIC PAIN 02/14/2009  . NONSPECIFIC ABN FINDNG RAD&OTH EXAM BILARY TRCT 02/14/2009      Prior to Admission medications   Medication Sig Start Date End Date Taking? Authorizing Provider  acetaminophen (TYLENOL) 325 MG tablet Take 650 mg by mouth every 6 (six) hours as needed.   Yes [provider]  ALPRAZolam (XANAX) 0.25 MG tablet Take 0.25 mg by mouth daily.    Yes [provider]  aspirin EC 81 MG tablet Take 81 mg by mouth daily.   Yes [provider]  Calcium Carbonate-Vitamin D (RA CALCIUM PLUS VITAMIN D) 600-400 MG-UNIT tablet Take 1 tablet by mouth daily.    Yes [provider]  diltiazem (CARDIZEM CD) 300 MG 24 hr capsule Take 1 capsule (300 mg total) by mouth daily. 02/15/18 02/15/19 Yes Dustin Flock, MD  escitalopram (LEXAPRO) 10 MG tablet Take 10 mg by mouth at bedtime.    Yes [provider]  isosorbide mononitrate (IMDUR) 30 MG 24 hr tablet Take 30 mg by mouth daily.    Yes [provider]  Menthol, Topical Analgesic, 2.5 % GEL Apply topically daily as needed (for muscle pain).   Yes [provider]  metoprolol tartrate (LOPRESSOR) 25 MG tablet Take 25 mg by mouth 2 (two) times daily. May take additional 25 mg if needed for heart rate.   Yes [provider]  mirtazapine (REMERON) 15 MG tablet Take 15 mg by mouth at bedtime.   Yes [provider]  Multiple Vitamins-Minerals (PRESERVISION AREDS 2 PO) Take 1 tablet by mouth 2 (two) times daily.   Yes [provider]  nitroGLYCERIN (NITROSTAT) 0.6 MG SL tablet Place 0.6 mg under the tongue every 5 (five) minutes as needed for chest pain.   Yes [provider]  Probiotic Product (PROBIOTIC DAILY PO) Take 1 tablet by mouth  daily.    Yes [provider]  RABEprazole (ACIPHEX) 20 MG tablet Take 1 tablet (20 mg total) by mouth 2 (two) times daily. 12/11/16  Yes Gladstone Lighter, MD  simethicone (GAS-X) 80 MG chewable tablet Chew 1 tablet (80 mg total) by mouth 4 (four) times daily as needed for flatulence. 02/15/18 02/15/19 Yes Dustin Flock, MD  traMADol (ULTRAM) 50 MG tablet Take 1-2 tablets (50-100 mg total) by mouth every 4 (four) hours as needed for  moderate pain. Patient taking differently: Take 50-100 mg by mouth 3 (three) times daily.  02/28/16  Yes Watt Climes, PA  vitamin B-12 (CYANOCOBALAMIN) 1000 MCG tablet Take 1,000 mcg by mouth at bedtime.    Yes [provider]  warfarin (COUMADIN) 3 MG tablet Take 3.5 mg by mouth at bedtime.  01/12/18  Yes [provider]  tiZANidine (ZANAFLEX) 2 MG tablet Take 2 mg by mouth daily as needed for muscle spasms.  12/11/18   [provider]    Allergies Alendronate sodium; Amoxicillin-pot clavulanate; Etodolac; Ibandronate sodium; Iodine; Lactose intolerance (gi); Minocycline hcl; Risedronate sodium; Sulfonamide derivatives; and Zoledronic acid    Social History Social History   Tobacco Use  . Smoking status: Never Smoker  . Smokeless tobacco: Never Used  Substance Use Topics  . Alcohol use: No    Alcohol/week: 0.0 standard drinks  . Drug use: No    Review of Systems Patient denies headaches, rhinorrhea, blurry vision, numbness, shortness of breath, chest pain, edema, cough, abdominal pain, nausea, vomiting, diarrhea, dysuria, fevers, rashes or hallucinations unless otherwise stated above in HPI. ____________________________________________   PHYSICAL EXAM:  VITAL SIGNS: Vitals:   01/17/19 1445 01/17/19 1500  BP: (!) 119/59 (!) 114/58  Pulse: 63 64  Resp: 18 20  Temp:    SpO2: 99% 94%    Constitutional: Alert and oriented.  Eyes: Conjunctivae are normal.  Head: Atraumatic. Nose: No  congestion/rhinnorhea. Mouth/Throat: Mucous membranes are moist.   Neck: No stridor. Painless ROM.  Cardiovascular: Normal rate, regular rhythm. Grossly normal heart sounds.  Good peripheral circulation. Respiratory: Normal respiratory effort.  No retractions. Lungs CTAB. Gastrointestinal: Soft and nontender. No distention. No abdominal bruits. No CVA tenderness. Genitourinary:  Musculoskeletal: Contusion and ecchymosis to the right hemithorax.  No overlying crepitus.  No lower extremity tenderness nor edema.  No joint effusions. Neurologic:  Normal speech and language. No gross focal neurologic deficits are appreciated. No facial droop Skin:  Skin is warm, dry and intact. No rash noted. Psychiatric: Mood and affect are normal. Speech and behavior are normal.  ____________________________________________   LABS (all labs ordered are listed, but only abnormal results are displayed)  Results for orders placed or performed during the hospital encounter of 01/17/19 (from the past 24 hour(s))  CBC with Differential/Platelet     Status: Abnormal   Collection Time: 01/17/19 10:39 AM  Result Value Ref Range   WBC 6.0 4.0 - 10.5 K/uL   RBC 3.87 3.87 - 5.11 MIL/uL   Hemoglobin 11.9 (L) 12.0 - 15.0 g/dL   HCT 36.7 36.0 - 46.0 %   MCV 94.8 80.0 - 100.0 fL   MCH 30.7 26.0 - 34.0 pg   MCHC 32.4 30.0 - 36.0 g/dL   RDW 13.2 11.5 - 15.5 %   Platelets 215 150 - 400 K/uL   nRBC 0.0 0.0 - 0.2 %   Neutrophils Relative % 65 %   Neutro Abs 3.9 1.7 - 7.7 K/uL   Lymphocytes Relative 19 %   Lymphs Abs 1.2 0.7 - 4.0 K/uL   Monocytes Relative 13 %   Monocytes Absolute 0.8 0.1 - 1.0 K/uL   Eosinophils Relative 2 %   Eosinophils Absolute 0.1 0.0 - 0.5 K/uL   Basophils Relative 1 %   Basophils Absolute 0.0 0.0 - 0.1 K/uL   Immature Granulocytes 0 %   Abs Immature Granulocytes 0.02 0.00 - 0.07 K/uL  Comprehensive metabolic panel     Status: Abnormal   Collection Time:  01/17/19 10:39 AM  Result Value Ref  Range   Sodium 138 135 - 145 mmol/L   Potassium 3.9 3.5 - 5.1 mmol/L   Chloride 105 98 - 111 mmol/L   CO2 24 22 - 32 mmol/L   Glucose, Bld 193 (H) 70 - 99 mg/dL   BUN 18 8 - 23 mg/dL   Creatinine, Ser 1.14 (H) 0.44 - 1.00 mg/dL   Calcium 9.0 8.9 - 10.3 mg/dL   Total Protein 7.3 6.5 - 8.1 g/dL   Albumin 3.7 3.5 - 5.0 g/dL   AST 65 (H) 15 - 41 U/L   ALT 49 (H) 0 - 44 U/L   Alkaline Phosphatase 118 38 - 126 U/L   Total Bilirubin 0.8 0.3 - 1.2 mg/dL   GFR calc non Af Amer 43 (L) >60 mL/min   GFR calc Af Amer 50 (L) >60 mL/min   Anion gap 9 5 - 15  Protime-INR     Status: Abnormal   Collection Time: 01/17/19 11:36 AM  Result Value Ref Range   Prothrombin Time 30.6 (H) 11.4 - 15.2 seconds   INR 2.99   Type and screen [facility]     Status: None   Collection Time: 01/17/19 11:36 AM  Result Value Ref Range   ABO/RH(D) A POS    Antibody Screen NEG    Sample Expiration      01/20/2019 Performed at [facility], [address] [place], [place] [address]    ____________________________________________ ____________________________________________  RADIOLOGY  I personally reviewed all radiographic images ordered to evaluate for the above acute complaints and reviewed radiology reports and findings.  These findings were personally discussed with the patient.  Please see medical record for radiology report. ____________________________________________   PROCEDURES  Procedure(s) performed:  Procedures    Critical Care performed: no ____________________________________________   INITIAL IMPRESSION / ASSESSMENT AND PLAN / ED COURSE  Pertinent labs & imaging results that were available during my care of the patient were reviewed by me and considered in my medical decision making (see chart for details).   DDX: fracture, hemothorax, ptx, contusion, liver lac  Peggy [name] is a 85 y.o. who presents to the ED with fall with right chest wall pain  as described above.  Patient nontoxic but very tender to touch concerning for rib fractures also concerning for liver injury.  Based on presentation on Coumadin will order CT imaging.  Will provide pain medication.    Patient reassessed.  Does have multiple right-sided rib fractures without underlying hemothorax or contusion but based on her age pain and splinting do feel patient will require hospitalization for pain management and respiratory management.  Was given incentive spirometer.  Otherwise stable for admission to this facility.  As part of my medical decision making, I reviewed the following data within the [facility] notes reviewed and incorporated, Labs reviewed, notes from prior ED visits and [place] Controlled Substance Database   ____________________________________________   FINAL CLINICAL IMPRESSION(S) / ED DIAGNOSES  Final diagnoses:  Closed fracture of multiple ribs of right side, initial encounter      NEW MEDICATIONS STARTED DURING THIS VISIT:  Current Discharge Medication List       Note:  This document was prepared using Dragon voice recognition software and may include unintentional dictation errors.    Merlyn Lot, MD 01/17/19 [phone]

## 2019-01-17 NOTE — Care Management Note (Signed)
Case Management Note  Patient Details  Name: Peggy [name] MRN: [id number] Date of Birth: 1928-03-04  Subjective/Objective: Patient is being seen in the ED after falling 2 days ago.  Patient has multiple rib fractures on the right.  Patient is from [facility] of [place].  [facility] provides her meals, apartment cleaning, laundry service, and transportation if needed.  Patient is independent in her apartment in all other ADL's.  Patient reports that she fell while making up her bed and fell onto the nightstand, afterward she was able to get up by herself.  Patient has a walker but has not needed it, patient has a raised toilet seat in the bathroom and an emergency call bell by the toilet.  Patient also has a medical alert necklace that when pushed will contact [facility] staff.  Daughter Loletha Carrow is at the bedside and reports she has HCPOA.  Patient is alert and oriented and capable of making her own decisions.  ED MD would like for the patient to be monitored overnight at the hospital.  PCP verified as Dr. Kary Kos who she sees every six months, daughter Loletha Carrow takes patient to her MD appointments.  [facility] [address].  Patient would like to return to independent living at discharge, she may need home health services.  RNCM will cont to follow. Doran Clay RN BSN [phone]                  Action/Plan:   Expected Discharge Date:                  Expected Discharge Plan:  Home/Self Care  In-House Referral:     Discharge planning Services  CM Consult  Post Acute Care Choice:    Choice offered to:     DME Arranged:    DME Agency:     HH Arranged:    HH Agency:     Status of Service:  In process, will continue to follow  If discussed at Long Length of Stay Meetings, dates discussed:    Additional Comments:  Shelbie Hutching, RN 01/17/2019, 2:24 PM

## 2019-01-17 NOTE — Progress Notes (Signed)
This RN took over patient care at 2300. Pt resting with eyes closed. Will continue to monitor.

## 2019-01-18 ENCOUNTER — Observation Stay: Payer: Medicare Other

## 2019-01-18 DIAGNOSIS — S2241XA Multiple fractures of ribs, right side, initial encounter for closed fracture: Secondary | ICD-10-CM | POA: Diagnosis not present

## 2019-01-18 LAB — CBC
HCT: 36.3 % (ref 36.0–46.0)
Hemoglobin: 11.8 g/dL — ABNORMAL LOW (ref 12.0–15.0)
MCH: 30.5 pg (ref 26.0–34.0)
MCHC: 32.5 g/dL (ref 30.0–36.0)
MCV: 93.8 fL (ref 80.0–100.0)
NRBC: 0 % (ref 0.0–0.2)
Platelets: 208 10*3/uL (ref 150–400)
RBC: 3.87 MIL/uL (ref 3.87–5.11)
RDW: 13.3 % (ref 11.5–15.5)
WBC: 7.2 10*3/uL (ref 4.0–10.5)

## 2019-01-18 LAB — PROTIME-INR
INR: 2.92
Prothrombin Time: 30.1 seconds — ABNORMAL HIGH (ref 11.4–15.2)

## 2019-01-18 MED ORDER — ALPRAZOLAM 0.25 MG PO TABS
0.2500 mg | ORAL_TABLET | Freq: Every day | ORAL | Status: DC
  Administered 2019-01-18: 0.25 mg via ORAL
  Filled 2019-01-18: qty 1

## 2019-01-18 MED ORDER — RISAQUAD PO CAPS
1.0000 | ORAL_CAPSULE | Freq: Every day | ORAL | Status: DC
  Administered 2019-01-18: 1 via ORAL
  Filled 2019-01-18: qty 1

## 2019-01-18 MED ORDER — METOPROLOL TARTRATE 25 MG PO TABS
25.0000 mg | ORAL_TABLET | Freq: Two times a day (BID) | ORAL | Status: DC
  Administered 2019-01-18: 25 mg via ORAL
  Filled 2019-01-18: qty 1

## 2019-01-18 MED ORDER — ISOSORBIDE MONONITRATE ER 30 MG PO TB24
30.0000 mg | ORAL_TABLET | Freq: Every day | ORAL | Status: DC
  Administered 2019-01-18: 30 mg via ORAL
  Filled 2019-01-18: qty 1

## 2019-01-18 MED ORDER — ASPIRIN EC 81 MG PO TBEC
81.0000 mg | DELAYED_RELEASE_TABLET | Freq: Every day | ORAL | Status: DC
  Administered 2019-01-18: 81 mg via ORAL
  Filled 2019-01-18: qty 1

## 2019-01-18 MED ORDER — DOCUSATE SODIUM 100 MG PO CAPS
100.0000 mg | ORAL_CAPSULE | Freq: Two times a day (BID) | ORAL | Status: DC
  Administered 2019-01-18: 100 mg via ORAL
  Filled 2019-01-18: qty 1

## 2019-01-18 MED ORDER — NITROGLYCERIN 0.4 MG SL SUBL: 0.4000 mg | SUBLINGUAL_TABLET | SUBLINGUAL | Status: DC | PRN

## 2019-01-18 MED ORDER — NITROGLYCERIN 0.6 MG SL SUBL
0.6000 mg | SUBLINGUAL_TABLET | SUBLINGUAL | Status: DC | PRN
  Filled 2019-01-18: qty 100

## 2019-01-18 MED ORDER — DILTIAZEM HCL ER COATED BEADS 300 MG PO CP24
300.0000 mg | ORAL_CAPSULE | Freq: Every day | ORAL | Status: DC
  Administered 2019-01-18: 300 mg via ORAL
  Filled 2019-01-18: qty 1

## 2019-01-18 MED ORDER — SIMETHICONE 80 MG PO CHEW
80.0000 mg | CHEWABLE_TABLET | Freq: Four times a day (QID) | ORAL | Status: DC | PRN
  Filled 2019-01-18: qty 1

## 2019-01-18 MED ORDER — TRAMADOL HCL 50 MG PO TABS: 50.0000 mg | ORAL_TABLET | ORAL | 0 refills | Status: AC | PRN

## 2019-01-18 MED ORDER — TIZANIDINE HCL 2 MG PO TABS
2.0000 mg | ORAL_TABLET | Freq: Every day | ORAL | Status: DC | PRN
  Filled 2019-01-18: qty 1

## 2019-01-18 MED ORDER — METOPROLOL TARTRATE 25 MG PO TABS: 25.0000 mg | ORAL_TABLET | ORAL | Status: DC | PRN

## 2019-01-18 MED ORDER — PANTOPRAZOLE SODIUM 40 MG PO TBEC
40.0000 mg | DELAYED_RELEASE_TABLET | Freq: Every day | ORAL | Status: DC
  Administered 2019-01-18: 40 mg via ORAL
  Filled 2019-01-18: qty 1

## 2019-01-18 MED ORDER — MIRTAZAPINE 15 MG PO TABS: 15.0000 mg | ORAL_TABLET | Freq: Every day | ORAL | Status: DC

## 2019-01-18 MED ORDER — WARFARIN SODIUM 3 MG PO TABS
3.0000 mg | ORAL_TABLET | Freq: Once | ORAL | Status: DC
  Filled 2019-01-18: qty 1

## 2019-01-18 MED ORDER — BISACODYL 5 MG PO TBEC: 5.0000 mg | DELAYED_RELEASE_TABLET | Freq: Every day | ORAL | Status: DC | PRN

## 2019-01-18 MED ORDER — ESCITALOPRAM OXALATE 10 MG PO TABS
10.0000 mg | ORAL_TABLET | Freq: Every day | ORAL | Status: DC
  Filled 2019-01-18: qty 1

## 2019-01-18 NOTE — Care Management Note (Signed)
Case Management Note  Patient Details  Name: [name] MRN: [id number] Date of Birth: July 23, 1928  Subjective/Objective:                  Met with patient to discuss DC plan and needs Patient was provided with the [facility] list per CMS.gov Patient states that she lives alone in [place] living  Patient has used [facility] in the past for [facility] PT and would like to use [facility] again if it is not available at [employer] of  [place]  Daughter is Olegario Shearer  And is the point of contact [phone] Patient has a Environmental consultant at home but does not use it, her daughter would like PT to encourage her to use if needed,  She has no other DME needs Patient will DC home today.  Action/Plan:  [place] list provided per CMS.gov, RNCM to [facility] [place] to see if they do [place] there , if not will set up with [facility]   Expected Discharge Date:  01/18/19               Expected Discharge Plan:  Home/Self Care  In-House Referral:     Discharge planning Services  CM Consult  Post Acute Care Choice:    Choice offered to:     DME Arranged:    DME Agency:     HH Arranged:  PT HH Agency:     Status of Service:  In process, will continue to follow  If discussed at Long Length of Stay Meetings, dates discussed:    Additional Comments:  Su Hilt, RN 01/18/2019, 9:22 AM

## 2019-01-18 NOTE — Care Management (Signed)
Faxed Home health PT order to [phone] for [place] to set up [facility] PT

## 2019-01-18 NOTE — Consult Note (Signed)
ANTICOAGULATION CONSULT NOTE - Follow up [place] for Warfarin Indication: atrial fibrillation  Allergies  Allergen Reactions  . Alendronate Sodium     REACTION: questionable  . Amoxicillin-Pot Clavulanate   . Etodolac   . Ibandronate Sodium     REACTION: questionable  . Iodine     Other reaction(s): Unknown  . Lactose Intolerance (Gi) Other (See Comments)    GI problems  . Minocycline Hcl   . Risedronate Sodium   . Sulfonamide Derivatives Other (See Comments)    Stomach pain  . Zoledronic Acid     REACTION: "dental problems"    Patient Measurements: Height: 4\' 9"  (144.8 cm) Weight: 109 lb (49.4 kg) IBW/kg (Calculated) : 38.6   Vital Signs: Temp: 97.7 F (36.5 C) (01/28 0743) Temp Source: Oral (01/28 0743) BP: 155/77 (01/28 0743) Pulse Rate: 81 (01/28 0743)  Labs: Recent Labs    01/17/19 1039 01/17/19 1136 01/18/19 0334  HGB 11.9*  --  11.8*  HCT 36.7  --  36.3  PLT 215  --  208  LABPROT  --  30.6* 30.1*  INR  --  2.99 2.92  CREATININE 1.14*  --   --     Estimated Creatinine Clearance: 23.1 mL/min (A) (by C-G formula based on SCr of 1.14 mg/dL (H)).   Medications:  Pt home dose of warfarin is 3.5mg  daily per med rec  Assessment: 85 yo female on warfarin PTA for AFib  Date INR Dose 1/27  2.99 3.5 mg 1/28 2.92  Goal of Therapy:  INR 2-3 Heparin level 0.3-0.7 units/ml Monitor platelets by anticoagulation protocol: Yes   Plan:  INR remains therapeutic. Will give warfarin 3 mg PO x1 for tonight to ensure INR stays <3 and for ease of administration. Will order daily INR to assess appropriate dosing. Hgb and plt count about stable from yesterday. Will need to ensure CBC ordered at least every three days per policy.   Pharmacy will continue to follow.   Rocky Morel, PharmD, BCPS Clinical Pharmacist 01/18/2019 7:59 AM

## 2019-01-18 NOTE — Evaluation (Signed)
Physical Therapy Evaluation Patient Details Name: Peggy [name] MRN: [id number] DOB: Feb 23, 1928 Today's Date: 01/18/2019   History of Present Illness  85 y.o. female with a known history of attention, atrial fibrillation, anxiety, hyperlipidemia comes to the emergency room from home place of [place] after patient had a mechanical fall and landed on the right side hitting the night table.  Pt found to have multiple posterior R rib fx's and L1-3 transvers process fx.  Clinical Impression  Pt did relatively well with PT showing good mobility, minimal limitations due to pain, functional strength in U&LEs and ability to circumambulate the nurses station needing only light cuing/gait training.  She did have issues with lightheadedness after ~150 ft of ambulation and ultimately needed to quickly get back to laying in bed.  Vitals appeared to be stable and appropriate t/o this but pt c/o some chest and R UQ pain (more so than in the back) with this and had some rubbing sensation in chest.  Nursing notified (who also spoke with MD regarding this).  From a PT stand-point pt is safe to go home, suggesting continued HHPT at [facility] to help facilitate return to baseline activity level status.     Follow Up Recommendations Home health PT    Equipment Recommendations  (pt has walker, discussed possible 4WW in future)    Recommendations for Other Services       Precautions / Restrictions Precautions Precautions: Fall Restrictions Weight Bearing Restrictions: No      Mobility  Bed Mobility Overal bed mobility: Independent             General bed mobility comments: Pt able to get supine to sit w/o grimacing or excessive effort, able to get back to supine post ambulation w/o assist  Transfers Overall transfer level: Independent               General transfer comment: Pt able to rise to standing w/o assist, showed good static balance  Ambulation/Gait Ambulation/Gait assistance:  Min guard Gait Distance (Feet): 200 Feet Assistive device: 1 person hand held assist;None       General Gait Details: Pt needed no UEs most of the time, though she did have some issues with lightheadedness/nausea after ~150 ft and needed PT's HHA to get back to the room safely.  Stairs            Wheelchair Mobility    Modified Rankin (Stroke Patients Only)       Balance Overall balance assessment: Modified Independent;Mild deficits observed, not formally tested                                           Pertinent Vitals/Pain Pain Assessment: (very minimal pain at rest, right flank with standing WBing)    Home Living Family/patient expects to be discharged to:: Private residence Living Arrangements: (independent living facility) Available Help at Discharge: (daughter lives close and can help if needed) Type of Home: Independent living facility Home Access: Level entry;Elevator     Home Layout: One level Home Equipment: Environmental consultant - 2 wheels      Prior Function Level of Independence: Independent         Comments: Pt does not use AD at baseline, walks to dining area 3x/day and walks for exercise QD as well     Hand Dominance        Extremity/Trunk Assessment  Upper Extremity Assessment Upper Extremity Assessment: Generalized weakness;Overall [facility] for tasks assessed(limited testing on R secondary to rib pain)    Lower Extremity Assessment Lower Extremity Assessment: Overall WFL for tasks assessed;Generalized weakness       Communication   Communication: No difficulties  Cognition Arousal/Alertness: Awake/alert Behavior During Therapy: WFL for tasks assessed/performed Overall Cognitive Status: Within Functional Limits for tasks assessed                                        General Comments General comments (skin integrity, edema, etc.): Pt did well with most of ambulation but had some issues with nausea.  We quickly  got back to supine, BP as 136/72 after just a minute HR and O2 were stable t/o the effort.  HR consistent (80s) while palpating pulse.  she did have some sensation of rubbing in chest (ribs?, lungs?) that was uncomfortable when this started.    Exercises     Assessment/Plan    PT Assessment Patient needs continued PT services  PT Problem List Pain;Decreased safety awareness;Decreased strength;Decreased range of motion;Decreased activity tolerance;Decreased balance;Decreased knowledge of use of DME;Decreased mobility       PT Treatment Interventions DME instruction;Gait training;Stair training;Functional mobility training;Therapeutic activities;Therapeutic exercise;Balance training;Neuromuscular re-education;Patient/family education    PT Goals (Current goals can be found in the Care Plan section)  Acute Rehab PT Goals Patient Stated Goal: go home PT Goal Formulation: With patient Time For Goal Achievement: 02/01/19 Potential to Achieve Goals: Good    Frequency Min 2X/week   Barriers to discharge        Co-evaluation               AM-PAC PT "6 Clicks" Mobility  Outcome Measure Help needed turning from your back to your side while in a flat bed without using bedrails?: None Help needed moving from lying on your back to sitting on the side of a flat bed without using bedrails?: None Help needed moving to and from a bed to a chair (including a wheelchair)?: None Help needed standing up from a chair using your arms (e.g., wheelchair or bedside chair)?: A Little Help needed to walk in hospital room?: A Little Help needed climbing 3-5 steps with a railing? : A Little 6 Click Score: 21    End of Session Equipment Utilized During Treatment: Gait belt Activity Tolerance: Patient limited by pain;Patient tolerated treatment well Patient left: with bed alarm set;with call bell/phone within reach;with family/visitor present Nurse Communication: Mobility status(nausea issue, rubbing  sensation in chest) PT Visit Diagnosis: Muscle weakness (generalized) (M62.81);Difficulty in walking, not elsewhere classified (R26.2);Pain Pain - Right/Left: Right Pain - part of body: (chest/ribs)    Time: 2633-3545 PT Time Calculation (min) (ACUTE ONLY): 22 min   Charges:   PT Evaluation $PT Eval Low Complexity: 1 Low PT Treatments $Gait Training: 8-22 mins        Kreg Shropshire, DPT 01/18/2019, 12:18 PM

## 2019-01-18 NOTE — Discharge Summary (Signed)
[facility] at [place] NAME: Peggy [name]    MR#:  [id number]  DATE OF BIRTH:  1928-09-19  DATE OF ADMISSION:  01/17/2019   ADMITTING PHYSICIAN: Fritzi Mandes, MD  DATE OF DISCHARGE: No discharge date for patient encounter.  PRIMARY CARE PHYSICIAN: Maryland Pink, MD   ADMISSION DIAGNOSIS:  Closed fracture of multiple ribs of right side, initial encounter [S22.41XA] DISCHARGE DIAGNOSIS:  Active Problems:   Rib fracture  SECONDARY DIAGNOSIS:   Past Medical History:  Diagnosis Date  . Allergy   . Anginal pain ([place])    c/o angina on May 04, 2016 releived by Nitroglycerin X 2  . Anxiety   . Arthritis   . Atrial fibrillation ([place])   . Atrial fibrillation ([place])   . Atrial fibrillation with rapid ventricular response [facility]) November 2015   Heart rate up to 140 requiring IV Cardizem  . Breast cancer [facility]) 2012   Patient underwent a left mastectomy on 03-31-11. Pathology showed DCIS at both sites without an invasive component. Sentinel nodes were negative. The breast lesions were noted to be ER +40%, PR-positive, 5%.   . Breast screening, unspecified   . Coronary artery disease   . Diverticulosis   . Dysrhythmia   . Essential hypertension, benign   . GERD (gastroesophageal reflux disease)   . Hearing disorder 2012   hearing aids  . Heart attack ([place]) 2001  . Heart disease 2001  . Hip pain   . Hyperlipidemia   . Hypertension 1996  . Lump or mass in breast   . Malignant neoplasm of upper-inner quadrant of female breast ([place])   . Malignant neoplasm of upper-outer quadrant of female breast ([place])   . Other benign neoplasm of connective and other soft tissue of thorax   . Personal history of malignant neoplasm of breast   . Reflux    acid reflux  . Shortness of breath dyspnea    with exertion  . Special screening for malignant neoplasms, colon   . Stroke [facility]) 2009  . Unspecified essential hypertension    HOSPITAL COURSE:  Peggy [name]  is a 85 y.o. female with a known history of attention, atrial fibrillation, anxiety, hyperlipidemia comes to the emergency room from home place of [place] after patient had a mechanical fall and landed on the right side hitting the night table. Patient was trying to fix her bed this morning denies any dizziness or lightheadedness.  1. Acute right rib fracture with lumbar L123 transverse process fracture status post mechanical fall -Tylenol, PRN tramadol. -incentive spirometer.  Repeated chest x-ray did not show any pneumothorax. PT.  2. Hypertension continue home meds  3. Chronic anxiety continue Xanax  4. Chronic atrial fibrillation on Coumadin.  continue Coumadin. No evidence of bleeding noted on CT chest or abdomen post fall.  DISCHARGE CONDITIONS:  Stable, discharge to home with home health PT today. CONSULTS OBTAINED:   DRUG ALLERGIES:   Allergies  Allergen Reactions  . Alendronate Sodium     REACTION: questionable  . Amoxicillin-Pot Clavulanate   . Etodolac   . Ibandronate Sodium     REACTION: questionable  . Iodine     Other reaction(s): Unknown  . Lactose Intolerance (Gi) Other (See Comments)    GI problems  . Minocycline Hcl   . Risedronate Sodium   . Sulfonamide Derivatives Other (See Comments)    Stomach pain  . Zoledronic Acid     REACTION: "dental problems"  DISCHARGE MEDICATIONS:   Allergies as of 01/18/2019      Reactions   Alendronate Sodium    REACTION: questionable   Amoxicillin-pot Clavulanate    Etodolac    Ibandronate Sodium    REACTION: questionable   Iodine    Other reaction(s): Unknown   Lactose Intolerance (gi) Other (See Comments)   GI problems   Minocycline Hcl    Risedronate Sodium    Sulfonamide Derivatives Other (See Comments)   Stomach pain   Zoledronic Acid    REACTION: "dental problems"      Medication List    TAKE these medications   acetaminophen 325 MG tablet Commonly known as:  TYLENOL Take 650 mg by  mouth every 6 (six) hours as needed.   ALPRAZolam 0.25 MG tablet Commonly known as:  XANAX Take 0.25 mg by mouth daily.   aspirin EC 81 MG tablet Take 81 mg by mouth daily.   diltiazem 300 MG 24 hr capsule Commonly known as:  CARDIZEM CD Take 1 capsule (300 mg total) by mouth daily.   escitalopram 10 MG tablet Commonly known as:  LEXAPRO Take 10 mg by mouth at bedtime.   isosorbide mononitrate 30 MG 24 hr tablet Commonly known as:  IMDUR Take 30 mg by mouth daily.   Menthol (Topical Analgesic) 2.5 % Gel Apply topically daily as needed (for muscle pain).   metoprolol tartrate 25 MG tablet Commonly known as:  LOPRESSOR Take 25 mg by mouth 2 (two) times daily. May take additional 25 mg if needed for heart rate.   mirtazapine 15 MG tablet Commonly known as:  REMERON Take 15 mg by mouth at bedtime.   nitroGLYCERIN 0.6 MG SL tablet Commonly known as:  NITROSTAT Place 0.6 mg under the tongue every 5 (five) minutes as needed for chest pain.   PRESERVISION AREDS 2 PO Take 1 tablet by mouth 2 (two) times daily.   PROBIOTIC DAILY PO Take 1 tablet by mouth daily.   RA CALCIUM PLUS VITAMIN D 600-400 MG-UNIT tablet Generic drug:  Calcium Carbonate-Vitamin D Take 1 tablet by mouth daily.   RABEprazole 20 MG tablet Commonly known as:  ACIPHEX Take 1 tablet (20 mg total) by mouth 2 (two) times daily.   simethicone 80 MG chewable tablet Commonly known as:  GAS-X Chew 1 tablet (80 mg total) by mouth 4 (four) times daily as needed for flatulence.   tiZANidine 2 MG tablet Commonly known as:  ZANAFLEX Take 2 mg by mouth daily as needed for muscle spasms.   traMADol 50 MG tablet Commonly known as:  ULTRAM Take 1 tablet (50 mg total) by mouth every 4 (four) hours as needed for up to 5 days for moderate pain. What changed:  how much to take   vitamin B-12 1000 MCG tablet Commonly known as:  CYANOCOBALAMIN Take 1,000 mcg by mouth at bedtime.   warfarin 3 MG tablet Commonly  known as:  COUMADIN Take 3.5 mg by mouth at bedtime.        DISCHARGE INSTRUCTIONS:  See AVS.  If you experience worsening of your admission symptoms, develop shortness of breath, life threatening emergency, suicidal or homicidal thoughts you must seek medical attention immediately by calling 911 or calling your MD immediately  if symptoms less severe.  You Must read complete instructions/literature along with all the possible adverse reactions/side effects for all the Medicines you take and that have been prescribed to you. Take any new Medicines after you have completely understood and accpet  all the possible adverse reactions/side effects.   Please note  You were cared for by a hospitalist during your hospital stay. If you have any questions about your discharge medications or the care you received while you were in the hospital after you are discharged, you can call the unit and asked to speak with the hospitalist on call if the hospitalist that took care of you is not available. Once you are discharged, your primary care physician will handle any further medical issues. Please note that NO REFILLS for any discharge medications will be authorized once you are discharged, as it is imperative that you return to your primary care physician (or establish a relationship with a primary care physician if you do not have one) for your aftercare needs so that they can reassess your need for medications and monitor your lab values.    On the day of Discharge:  VITAL SIGNS:  Blood pressure (!) 155/77, pulse 81, temperature 97.7 F (36.5 C), temperature source Oral, resp. rate 14, height 4\' 9"  (1.448 m), weight 49.4 kg, SpO2 95 %. PHYSICAL EXAMINATION:  GENERAL:  85 y.o.-year-old patient lying in the bed with no acute distress.  EYES: Pupils equal, round, reactive to light and accommodation. No scleral icterus. Extraocular muscles intact.  HEENT: Head atraumatic, normocephalic. Oropharynx and  nasopharynx clear.  NECK:  Supple, no jugular venous distention. No thyroid enlargement, no tenderness.  LUNGS: Normal breath sounds bilaterally, no wheezing, rales,rhonchi or crepitation. No use of accessory muscles of respiration.  CARDIOVASCULAR: S1, S2 normal. No murmurs, rubs, or gallops.  ABDOMEN: Soft, non-tender, non-distended. Bowel sounds present. No organomegaly or mass.  EXTREMITIES: No pedal edema, cyanosis, or clubbing.  NEUROLOGIC: Cranial nerves II through XII are intact. Muscle strength 5/5 in all extremities. Sensation intact. Gait not checked.  PSYCHIATRIC: The patient is alert and oriented x 3.  SKIN: No obvious rash, lesion, or ulcer.  DATA REVIEW:   CBC Recent Labs  Lab 01/18/19 0334  WBC 7.2  HGB 11.8*  HCT 36.3  PLT 208    Chemistries  Recent Labs  Lab 01/17/19 1039  NA 138  K 3.9  CL 105  CO2 24  GLUCOSE 193*  BUN 18  CREATININE 1.14*  CALCIUM 9.0  AST 65*  ALT 49*  ALKPHOS 118  BILITOT 0.8     Microbiology Results  Results for orders placed or performed during the hospital encounter of 09/27/18  Urine Culture     Status: None   Collection Time: 09/27/18  6:13 PM  Result Value Ref Range Status   Specimen Description   Final    URINE, RANDOM Performed at [facility], [address], [place], [place] [address]    Special Requests   Final    NONE Performed at [facility], [address] [place], [place] [address]    Culture   Final    NO GROWTH Performed at [facility], [place] [address] [place], [place] [address]    Report Status 09/29/2018 FINAL  Final    RADIOLOGY:  Dg Chest Port 1 View  Result Date: 01/18/2019 CLINICAL DATA:  Shortness of breath. EXAM: PORTABLE CHEST 1 VIEW COMPARISON:  Chest CT 01/17/2018. FINDINGS: Mediastinum and hilar structures normal. Stable cardiomegaly. Mild bibasilar atelectasis. Mild infiltrate right lung base can not be excluded. Small right pleural effusion. Heart size  normal. Severe thoracic spine scoliosis and degenerative change. IMPRESSION: 1. Mild bibasilar atelectasis. Right lower lobe infiltrate can not be excluded.  Small right pleural effusion. 2.  Stable cardiomegaly.  No pulmonary venous congestion. Electronically Signed   By: Marcello Moores  Register   On: 01/18/2019 11:29     Management plans discussed with the patient, her daughter and they are in agreement.  CODE STATUS: Full Code   TOTAL TIME TAKING CARE OF THIS PATIENT: 33 minutes.    Demetrios Loll M.D on 01/18/2019 at 1:50 PM  Between 7am to 6pm - Pager - [phone]  After 6pm go to [email or web address] - Proofreader  [facility]  Office  [phone]  CC: Primary care physician; Maryland Pink, MD   Note: This dictation was prepared with Dragon dictation along with smaller phrase technology. Any transcriptional errors that result from this process are unintentional.

## 2019-01-18 NOTE — Care Management (Signed)
[place] and asked if they offer [facility] PT or if it would need to be set up thru an outside agency. Spoke with Jana Half, She stated that they use an agency and they come to do the whole facility, They use [facility] and they will set it up after receiving the faxed order for PT. [phone] is the fax number to send in the [facility] PT order

## 2019-01-18 NOTE — Progress Notes (Signed)
Patient is alert and oriented and able to verbalize needs. No complaints of pain at this time. VSS. PIV removed. Daughter at bedside. Discharge instructions gone over with daughter at patient. Hard script for ultram given to daughter in discharge packet. Nurse escorted patient to car via wc. Daughter to transport patient back to [facility].   Bethann Punches, RN

## 2019-10-11 IMAGING — DX DG CHEST 1V PORT
1 series · 1 of 1 positions shown · non-contrast
Comparison: Portable exam 0545 hours compared to 02/14/2018

CLINICAL DATA: Chest pain today, history atrial fibrillation,
coronary disease post MI, hypertension, breast cancer, stroke

EXAM:
PORTABLE CHEST 1 VIEW

[chest ap]
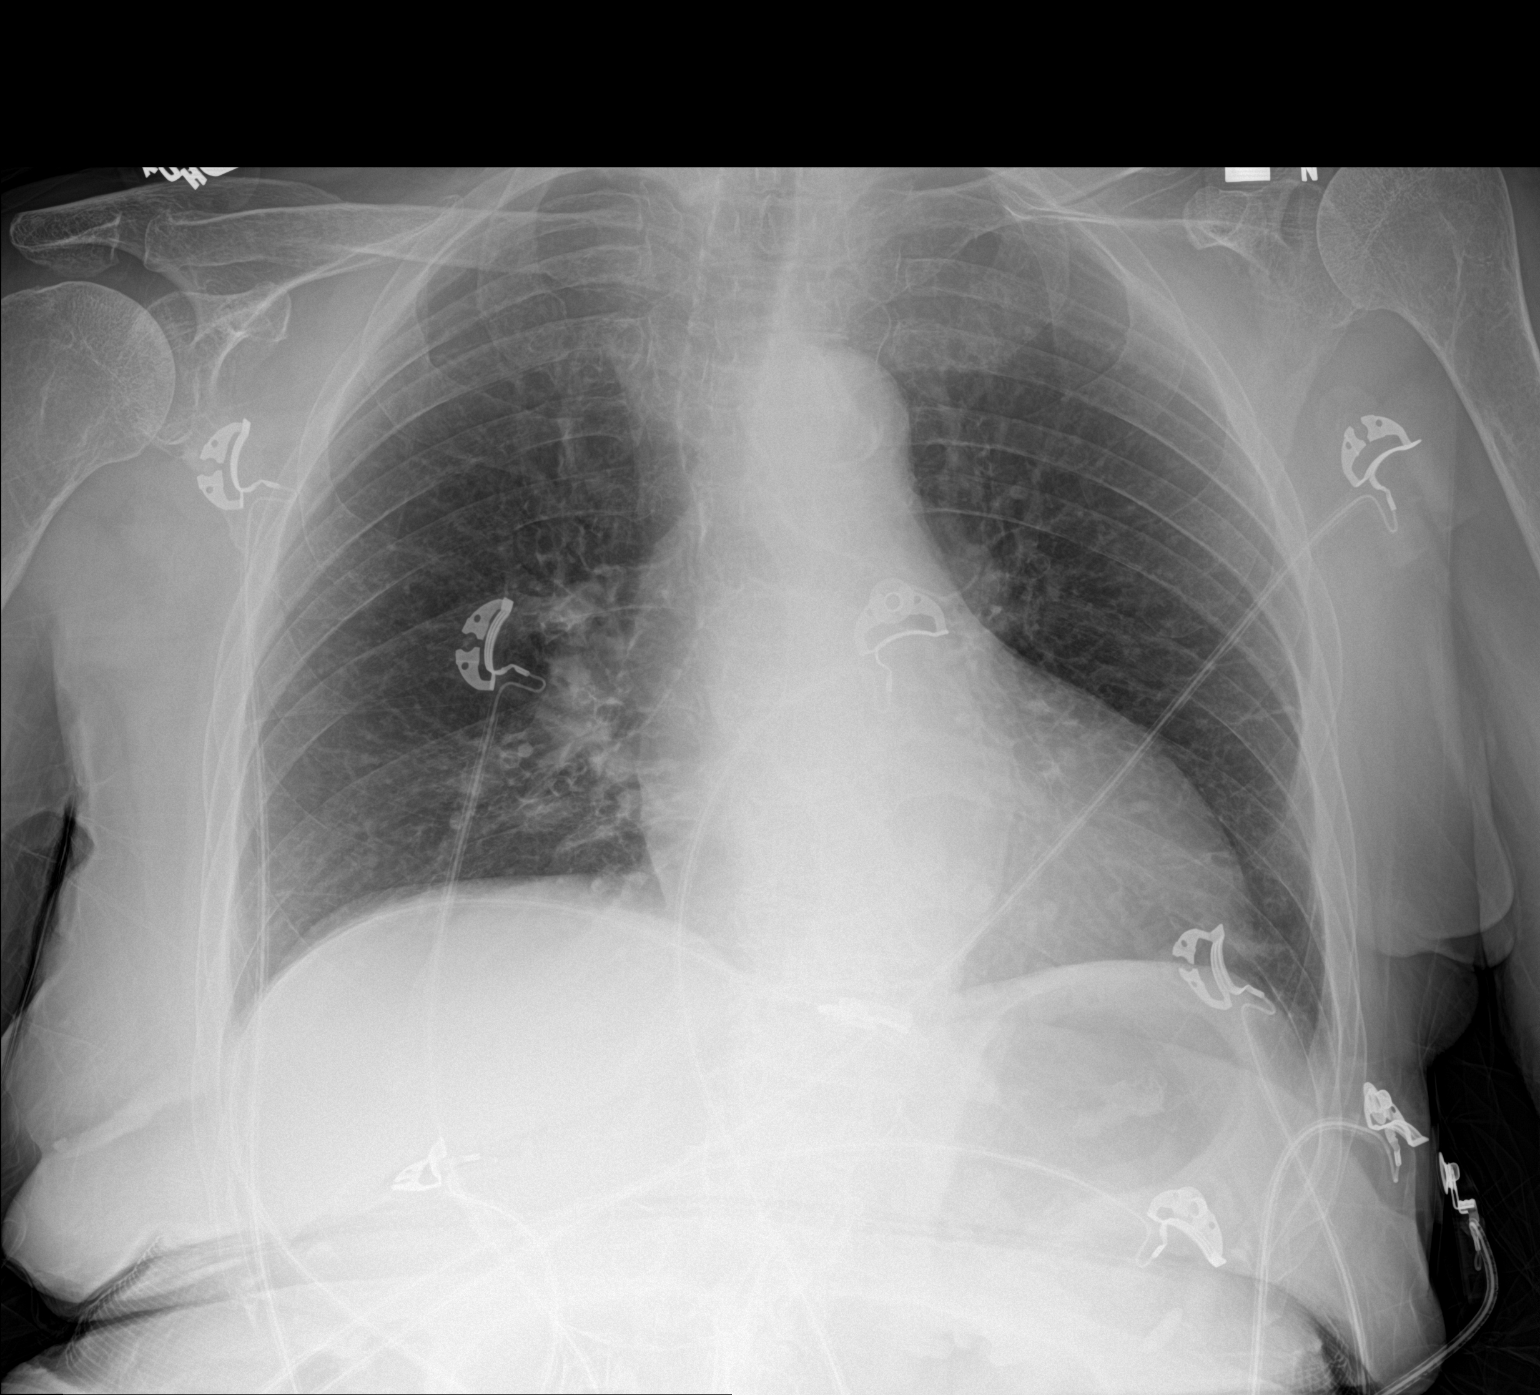

[1 of 1 positions shown; findings below may reference images not displayed]

FINDINGS: Enlargement of cardiac silhouette.

Atherosclerotic calcification aorta.

Mediastinal contours and pulmonary vascularity normal.

Bronchitic changes without infiltrate, pleural effusion or
pneumothorax.

Bones demineralized.
IMPRESSION: Minimal enlargement of cardiac silhouette.

Chronic bronchitic changes without infiltrate.

## 2020-09-27 ENCOUNTER — Other Ambulatory Visit: Payer: Self-pay

## 2020-09-27 ENCOUNTER — Emergency Department
Admission: EM | Admit: 2020-09-27 | Discharge: 2020-09-27 | Disposition: A | Discharge status: Home / Self Care | Payer: Medicare Other | Attending: Emergency Medicine | Admitting: Emergency Medicine

## 2020-09-27 ENCOUNTER — Encounter: Payer: Self-pay | Admitting: Emergency Medicine

## 2020-09-27 ENCOUNTER — Emergency Department: Payer: Medicare Other

## 2020-09-27 DIAGNOSIS — R0789 Other chest pain: Secondary | ICD-10-CM | POA: Insufficient documentation

## 2020-09-27 DIAGNOSIS — I2510 Atherosclerotic heart disease of native coronary artery without angina pectoris: Secondary | ICD-10-CM | POA: Insufficient documentation

## 2020-09-27 DIAGNOSIS — M5134 Other intervertebral disc degeneration, thoracic region: Secondary | ICD-10-CM | POA: Diagnosis not present

## 2020-09-27 DIAGNOSIS — Z79899 Other long term (current) drug therapy: Secondary | ICD-10-CM | POA: Insufficient documentation

## 2020-09-27 DIAGNOSIS — R079 Chest pain, unspecified: Secondary | ICD-10-CM | POA: Diagnosis present

## 2020-09-27 DIAGNOSIS — Z96641 Presence of right artificial hip joint: Secondary | ICD-10-CM | POA: Insufficient documentation

## 2020-09-27 DIAGNOSIS — I10 Essential (primary) hypertension: Secondary | ICD-10-CM | POA: Diagnosis not present

## 2020-09-27 DIAGNOSIS — Z7982 Long term (current) use of aspirin: Secondary | ICD-10-CM | POA: Diagnosis not present

## 2020-09-27 LAB — CBC
HCT: 38.2 % (ref 36.0–46.0)
Hemoglobin: 12.7 g/dL (ref 12.0–15.0)
MCH: 32.1 pg (ref 26.0–34.0)
MCHC: 33.2 g/dL (ref 30.0–36.0)
MCV: 96.5 fL (ref 80.0–100.0)
Platelets: 269 10*3/uL (ref 150–400)
RBC: 3.96 MIL/uL (ref 3.87–5.11)
RDW: 12.5 % (ref 11.5–15.5)
WBC: 7.1 10*3/uL (ref 4.0–10.5)
nRBC: 0 % (ref 0.0–0.2)

## 2020-09-27 LAB — LIPASE, BLOOD: Lipase: 32 U/L (ref 11–51)

## 2020-09-27 LAB — HEPATIC FUNCTION PANEL
ALT: 13 U/L (ref 0–44)
AST: 22 U/L (ref 15–41)
Albumin: 3.8 g/dL (ref 3.5–5.0)
Alkaline Phosphatase: 101 U/L (ref 38–126)
Bilirubin, Direct: <0.1 mg/dL (ref 0.0–0.2)
Total Bilirubin: 0.7 mg/dL (ref 0.3–1.2)
Total Protein: 7.4 g/dL (ref 6.5–8.1)

## 2020-09-27 LAB — PROTIME-INR
INR: 2.5 — ABNORMAL HIGH (ref 0.8–1.2)
Prothrombin Time: 26.3 seconds — ABNORMAL HIGH (ref 11.4–15.2)

## 2020-09-27 LAB — BASIC METABOLIC PANEL
Anion gap: 11 (ref 5–15)
BUN: 19 mg/dL (ref 8–23)
CO2: 25 mmol/L (ref 22–32)
Calcium: 9.2 mg/dL (ref 8.9–10.3)
Chloride: 103 mmol/L (ref 98–111)
Creatinine, Ser: 1.25 mg/dL — ABNORMAL HIGH (ref 0.44–1.00)
GFR calc non Af Amer: 38 mL/min — ABNORMAL LOW (ref >60)
Glucose, Bld: 118 mg/dL — ABNORMAL HIGH (ref 70–99)
Potassium: 4.2 mmol/L (ref 3.5–5.1)
Sodium: 139 mmol/L (ref 135–145)

## 2020-09-27 LAB — TROPONIN I (HIGH SENSITIVITY)
Troponin I (High Sensitivity): 8 ng/L (ref <18)
Troponin I (High Sensitivity): 7 ng/L (ref <18)

## 2020-09-27 MED ORDER — ALUM & MAG HYDROXIDE-SIMETH 200-200-20 MG/5ML PO SUSP
30.0000 mL | Freq: Once | ORAL | Status: AC
  Administered 2020-09-27: 30 mL via ORAL
  Filled 2020-09-27: qty 30

## 2020-09-27 MED ORDER — LIDOCAINE VISCOUS HCL 2 % MT SOLN
15.0000 mL | Freq: Once | OROMUCOSAL | Status: AC
  Administered 2020-09-27: 15 mL via ORAL
  Filled 2020-09-27: qty 15

## 2020-09-27 MED ORDER — DEXAMETHASONE SODIUM PHOSPHATE 4 MG/ML IJ SOLN
4.0000 mg | Freq: Once | INTRAMUSCULAR | Status: AC
  Administered 2020-09-27: 4 mg via INTRAVENOUS
  Filled 2020-09-27: qty 1

## 2020-09-27 MED ORDER — MORPHINE SULFATE (PF) 4 MG/ML IV SOLN
INTRAVENOUS | Status: AC
  Administered 2020-09-27: 2 mg
  Filled 2020-09-27: qty 1

## 2020-09-27 MED ORDER — MORPHINE SULFATE (PF) 2 MG/ML IV SOLN: 2.0000 mg | Freq: Once | INTRAVENOUS | Status: DC

## 2020-09-27 MED ORDER — IOHEXOL 350 MG/ML SOLN
100.0000 mL | Freq: Once | INTRAVENOUS | Status: AC | PRN
  Administered 2020-09-27: 100 mL via INTRAVENOUS

## 2020-09-27 MED ORDER — ACETAMINOPHEN 325 MG PO TABS
650.0000 mg | ORAL_TABLET | Freq: Once | ORAL | Status: AC
  Administered 2020-09-27: 650 mg via ORAL
  Filled 2020-09-27: qty 2

## 2020-09-27 MED ORDER — LIDOCAINE 5 % EX PTCH: 2.0000 | MEDICATED_PATCH | Freq: Two times a day (BID) | CUTANEOUS | 0 refills | Status: AC

## 2020-09-27 NOTE — Discharge Instructions (Addendum)
For your pain:  Take TYLENOL 1000 mg every 6 hours for the next 2-3 days, then as needed Take your TRAMADOL 50 mg three times a day for the next 2-3 days, even if not severely hurting, then as needed We have given you a steroid today to help Apply the lidoderm patch to your back and side TWICE A DAY for pain  Call your primary doctor for follow-up on Monday or Tuesday

## 2020-09-27 NOTE — ED Triage Notes (Signed)
Pt here with chest pain that goes straight through to the back.  VSS at this time. Unlabored. Color WNL.  Pain started day ago.  Pain goes across shoulders. Currently pt oriented but very hard of hearing.

## 2020-09-27 NOTE — ED Provider Notes (Signed)
[facility] Emergency Department Provider Note  ____________________________________________   First MD Initiated Contact with Patient 09/27/20 1719     (approximate)  I have reviewed the triage vital signs and the nursing notes.   HISTORY  Chief Complaint Chest Pain    HPI Peggy [name] is a 86 y.o. female here with back and chest pain.  The patient states that for the last day, she has had a sharp, positional, substernal chest pain that radiates to her mid thoracic back.  The pain is worse with palpation, movement, and she denies any associated shortness of breath.  No nausea or vomiting.  Denies any falls or trauma.  Denies any associated cough.  She has a history of A. fib on Coumadin, denies any palpitations.  Denies any blood pressure changes.  Pain is worse with certain positions.  Pain is also worse with deep inspiration.  She has been taking Coumadin and has been therapeutic on it.  No other acute complaints.        Past Medical History:  Diagnosis Date  . Allergy   . Anginal pain ([place])    c/o angina on May 04, 2016 releived by Nitroglycerin X 2  . Anxiety   . Arthritis   . Atrial fibrillation ([place])   . Atrial fibrillation ([place])   . Atrial fibrillation with rapid ventricular response [facility]) November 2015   Heart rate up to 140 requiring IV Cardizem  . Breast cancer [facility]) 2012   Patient underwent a left mastectomy on 03-31-11. Pathology showed DCIS at both sites without an invasive component. Sentinel nodes were negative. The breast lesions were noted to be ER +40%, PR-positive, 5%.   . Breast screening, unspecified   . Coronary artery disease   . Diverticulosis   . Dysrhythmia   . Essential hypertension, benign   . GERD (gastroesophageal reflux disease)   . Hearing disorder 2012   hearing aids  . Heart attack ([place]) 2001  . Heart disease 2001  . Hip pain   . Hyperlipidemia   . Hypertension 1996  . Lump or mass in breast   . Malignant  neoplasm of upper-inner quadrant of female breast ([place])   . Malignant neoplasm of upper-outer quadrant of female breast ([place])   . Other benign neoplasm of connective and other soft tissue of thorax   . Personal history of malignant neoplasm of breast   . Reflux    acid reflux  . Shortness of breath dyspnea    with exertion  . Special screening for malignant neoplasms, colon   . Stroke [facility]) 2009  . Unspecified essential hypertension     Patient Active Problem List   Diagnosis Date Noted  . Rib fracture 01/17/2019  . Chest pain 02/14/2018  . Rectal bleed 09/01/2017  . Atrial flutter ([place]) 12/08/2016  . S/P total hip arthroplasty 02/27/2016  . Hip pain 01/15/2016  . Memory loss 03/21/2015  . Abnormality of gait 03/21/2015  . Atrial fibrillation with RVR ([place]) 03/11/2015  . H/O: CVA (cerebrovascular accident) 03/11/2015  . Right temporal lobe infarction ([place]) 04/10/2014  . Blurred vision 09/05/2013  . 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 EPIGASTRIC PAIN 02/14/2009  . NONSPECIFIC ABN FINDNG RAD&OTH EXAM BILARY TRCT 02/14/2009    Past Surgical History:  Procedure Laterality Date  . ABDOMINAL HYSTERECTOMY     age 71  . APPENDECTOMY     age 65  . BREAST  BIOPSY     left breast biopsy over 15 years ago   . CHOLECYSTECTOMY     40 years ago   . COLON SURGERY     Dr. Tamala Julian, [facility]  . COLONOSCOPY  2009   [facility] Dr. Donnella Sham  . EXCISION OF BACK LESION Left 05/09/2016   Procedure: EXCISION OF GLUTEAL MASS;  Surgeon: Robert Bellow, MD;  Location: [facility] ORS;  Service: General;  Laterality: Left;  . EYE SURGERY Bilateral    Cataract Extraction with IOL  . HERNIA REPAIR    . JOINT REPLACEMENT Right    TOTAL HIP REPLACEMENT  . MASTECTOMY  2012   left breast  . SMALL INTESTINE SURGERY  12/31/2012   Abdominal exploration, lysis of adhesions for distal small bowel obstruction. Rochel Brome, MD  . TONSILLECTOMY      as a child  . TOTAL HIP ARTHROPLASTY Right 02/27/2016   Procedure: TOTAL HIP ARTHROPLASTY;  Surgeon: Dereck Leep, MD;  Location: [facility] ORS;  Service: Orthopedics;  Laterality: Right;  . UPPER GI ENDOSCOPY  2009   [facility] Dr. Donnella Sham    Prior to Admission medications   Medication Sig Start Date End Date Taking? Authorizing Provider  acetaminophen (TYLENOL) 325 MG tablet Take 650 mg by mouth every 6 (six) hours as needed.    [provider]  ALPRAZolam Duanne Moron) 0.25 MG tablet Take 0.25 mg by mouth daily.     [provider]  aspirin EC 81 MG tablet Take 81 mg by mouth daily.    [provider]  Calcium Carbonate-Vitamin D (RA CALCIUM PLUS VITAMIN D) 600-400 MG-UNIT tablet Take 1 tablet by mouth daily.     [provider]  diltiazem (CARDIZEM CD) 300 MG 24 hr capsule Take 1 capsule (300 mg total) by mouth daily. 02/15/18 02/15/19  Dustin Flock, MD  escitalopram (LEXAPRO) 10 MG tablet Take 10 mg by mouth at bedtime.     [provider]  isosorbide mononitrate (IMDUR) 30 MG 24 hr tablet Take 30 mg by mouth daily.     [provider]  lidocaine (LIDODERM) 5 % Place 2 patches onto the skin every 12 (twelve) hours for 7 days. Remove & Discard patch within 12 hours or as directed by MD 09/27/20 10/04/20  Duffy Bruce, MD  Menthol, Topical Analgesic, 2.5 % GEL Apply topically daily as needed (for muscle pain).    [provider]  metoprolol tartrate (LOPRESSOR) 25 MG tablet Take 25 mg by mouth 2 (two) times daily. May take additional 25 mg if needed for heart rate.    [provider]  mirtazapine (REMERON) 15 MG tablet Take 15 mg by mouth at bedtime.    [provider]  Multiple Vitamins-Minerals (PRESERVISION AREDS 2 PO) Take 1 tablet by mouth 2 (two) times daily.    [provider]  nitroGLYCERIN (NITROSTAT) 0.6 MG SL tablet Place 0.6 mg under the tongue every 5 (five) minutes as needed for chest pain.    [provider]  Probiotic Product (PROBIOTIC DAILY PO) Take 1 tablet by mouth daily.     [provider]  RABEprazole (ACIPHEX) 20 MG tablet Take 1 tablet (20 mg total) by mouth 2 (two) times daily. 12/11/16   Gladstone Lighter, MD  simethicone (GAS-X) 80 MG chewable tablet Chew 1 tablet (80 mg total) by mouth 4 (four) times daily as needed for flatulence. 02/15/18 02/15/19  Dustin Flock, MD  tiZANidine (ZANAFLEX) 2 MG tablet Take 2 mg by mouth daily  as needed for muscle spasms.  12/11/18   [provider]  vitamin B-12 (CYANOCOBALAMIN) 1000 MCG tablet Take 1,000 mcg by mouth at bedtime.     [provider]  warfarin (COUMADIN) 3 MG tablet Take 3.5 mg by mouth at bedtime.  01/12/18   [provider]    Allergies Alendronate sodium, Amoxicillin-pot clavulanate, Etodolac, Ibandronate sodium, Iodine, Lactose intolerance (gi), Minocycline hcl, Risedronate sodium, Sulfonamide derivatives, and Zoledronic acid  Family History  Problem Relation Age of Onset  . Heart disease Mother   . Stroke Father   . Heart disease Father   . Breast cancer Sister        x 2, in their 12's  . Lung cancer Brother   . Colon cancer Neg Hx   . Ovarian cancer Neg Hx     Social History Social History   Tobacco Use  . Smoking status: Never Smoker  . Smokeless tobacco: Never Used  Substance Use Topics  . Alcohol use: No    Alcohol/week: 0.0 standard drinks  . Drug use: No    Review of Systems  Review of Systems  Constitutional: Positive for fatigue. Negative for fever.  HENT: Negative for congestion and sore throat.   Eyes: Negative for visual disturbance.  Respiratory: Negative for cough and shortness of breath.   Cardiovascular: Positive for chest pain.  Gastrointestinal: Negative for abdominal pain, diarrhea, nausea and vomiting.  Genitourinary: Negative for flank pain.  Musculoskeletal: Positive for back pain. Negative for neck pain.  Skin: Negative for rash and  wound.  Neurological: Negative for weakness.  All other systems reviewed and are negative.    ____________________________________________  PHYSICAL EXAM:      VITAL SIGNS: ED Triage Vitals  Enc Vitals Group     BP 09/27/20 1422 122/64     Pulse Rate 09/27/20 1422 87     Resp 09/27/20 1422 18     Temp 09/27/20 1422 98.3 F (36.8 C)     Temp Source 09/27/20 1422 Oral     SpO2 09/27/20 1422 96 %     Weight 09/27/20 1422 110 lb (49.9 kg)     Height 09/27/20 1422 4\' 9"  (1.448 m)     Head Circumference --      Peak Flow --      Pain Score 09/27/20 1433 7     Pain Loc --      Pain Edu? --      Excl. in [place]? --      Physical Exam Vitals and nursing note reviewed.  Constitutional:      General: She is not in acute distress.    Appearance: She is well-developed.  HENT:     Head: Normocephalic and atraumatic.  Eyes:     Conjunctiva/sclera: Conjunctivae normal.  Cardiovascular:     Rate and Rhythm: Normal rate and regular rhythm.     Heart sounds: Normal heart sounds. No murmur heard.  No friction rub.  Pulmonary:     Effort: Pulmonary effort is normal. No respiratory distress.     Breath sounds: Normal breath sounds. No wheezing or rales.  Chest:     Comments: Tenderness to palpation along lower sternum.  No bruising or deformity.  No rash. Abdominal:     General: There is no distension.     Palpations: Abdomen is soft.     Tenderness: There is no abdominal tenderness.  Musculoskeletal:     Cervical back: Neck supple.     Comments: Significant  tenderness along the thoracolumbar midline spine, extending to the right paraspinal area.  No deformity.  No rash.  Skin:    General: Skin is warm.     Capillary Refill: Capillary refill takes less than 2 seconds.  Neurological:     Mental Status: She is alert and oriented to person, place, and time.     Motor: No abnormal muscle tone.       ____________________________________________   LABS (all labs ordered are  listed, but only abnormal results are displayed)  Labs Reviewed  BASIC METABOLIC PANEL - Abnormal; Notable for the following components:      Result Value   Glucose, Bld 118 (*)    Creatinine, Ser 1.25 (*)    GFR calc non Af Amer 38 (*)    All other components within normal limits  PROTIME-INR - Abnormal; Notable for the following components:   Prothrombin Time 26.3 (*)    INR 2.5 (*)    All other components within normal limits  CBC  HEPATIC FUNCTION PANEL  LIPASE, BLOOD  TROPONIN I (HIGH SENSITIVITY)  TROPONIN I (HIGH SENSITIVITY)    ____________________________________________  EKG: Normal sinus rhythm, ventricular rate 86.  PR 148, QRS 100, QTc 435.  LVH with repull abnormality.  Incomplete right bundle branch block.  No acute ST elevations or depressions. ________________________________________  RADIOLOGY All imaging, including plain films, CT scans, and ultrasounds, independently reviewed by me, and interpretations confirmed via formal radiology reads.  ED MD interpretation:   Chest x-ray: No active disease CT dissection: Cardiomegaly, stable pulmonary nodules, ectatic infrarenal abdominal aorta, recommend follow-up every 5 years  Official radiology report(s): DG Chest 2 View  Result Date: 09/27/2020 CLINICAL DATA:  Chest pain EXAM: CHEST - 2 VIEW COMPARISON:  01/18/2019, CT 01/17/2019, radiograph 02/14/2018, 12/08/2016 FINDINGS: Low lung volumes with vascular crowding. Mild right infrahilar atelectasis or scarring. No consolidation or effusion. Stable cardiomediastinal silhouette with aortic atherosclerosis. No pneumothorax. Chronic compression deformities at the thoracolumbar junction. IMPRESSION: No active cardiopulmonary disease. Low lung volumes with mild right infrahilar atelectasis or scarring. Electronically Signed   By: Donavan Foil M.D.   On: 09/27/2020 15:30   CT Angio Chest/Abd/Pel for Dissection W and/or Wo Contrast  Result Date: 09/27/2020 CLINICAL DATA:   Chest pain.  Back pain.  Concern for dissection. EXAM: CT ANGIOGRAPHY CHEST, ABDOMEN AND PELVIS TECHNIQUE: Non-contrast CT of the chest was initially obtained. Multidetector CT imaging through the chest, abdomen and pelvis was performed using the standard protocol during bolus administration of intravenous contrast. Multiplanar reconstructed images and MIPs were obtained and reviewed to evaluate the vascular anatomy. CONTRAST:  179mL OMNIPAQUE IOHEXOL 350 MG/ML SOLN COMPARISON:  January 17, 2019. FINDINGS: CTA CHEST FINDINGS Cardiovascular: There is no evidence for thoracic aortic aneurysm or dissection. Atherosclerotic changes are noted of the thoracic aorta. There is cardiomegaly. There are coronary artery calcifications. The arch vessels are grossly patent where visualized. There is no large centrally located pulmonary embolism. Mediastinum/Nodes: -- No mediastinal lymphadenopathy. -- No hilar lymphadenopathy. -- No axillary lymphadenopathy. -- No supraclavicular lymphadenopathy. -- Normal thyroid gland where visualized. -  Unremarkable esophagus. Lungs/Pleura: There are stable small 5 mm pulmonary nodules in the right middle lobe (axial series 6, image 71). There is chronic scarring versus atelectasis at the lung bases. There are stable pulmonary nodules in the left lower lobe measuring up to approximately 6 mm (axial series 6, image 78). Musculoskeletal: Old bilateral rib fractures are noted. Old compression fractures are noted of the lower thoracic  spine. No new fracture identified on today's study. Review of the MIP images confirms the above findings. CTA ABDOMEN AND PELVIS FINDINGS VASCULAR Aorta: There are atherosclerotic changes throughout the abdominal aorta with ectasias of the distal abdominal aorta measuring up to approximately 2.8 cm. Celiac: There is moderate narrowing at the origin of the celiac axis. SMA: There is mild narrowing at the origin of the SMA. Renals: There is moderate high-grade  stenosis at the origin of the right renal artery, likely explaining the right renal atrophy. There is mild narrowing at the origin of the left renal artery. IMA: Patent without evidence of aneurysm, dissection, vasculitis or significant stenosis. Inflow: Patent without evidence of aneurysm, dissection, vasculitis or significant stenosis. Veins: No obvious venous abnormality within the limitations of this arterial phase study. Review of the MIP images confirms the above findings. NON-VASCULAR Hepatobiliary: The liver is normal. Status post cholecystectomy.There is stable intrahepatic and extrahepatic biliary ductal dilatation. Pancreas: Relatively stable cystic appearing lesions are noted in the pancreatic body. Spleen: Unremarkable. Adrenals/Urinary Tract: --Adrenal glands: Unremarkable. --Right kidney/ureter: The right kidney is slightly atrophic. --Left kidney/ureter: No hydronephrosis or radiopaque kidney stones. --Urinary bladder: Unremarkable. Stomach/Bowel: --Stomach/Duodenum: No hiatal hernia or other gastric abnormality. Normal duodenal course and caliber. --Small bowel: Unremarkable. --Colon: Rectosigmoid diverticulosis without acute inflammation. --Appendix: Not visualized. No right lower quadrant inflammation or free fluid. Lymphatic: --No retroperitoneal lymphadenopathy. --No mesenteric lymphadenopathy. --No pelvic or inguinal lymphadenopathy. Reproductive: Status post hysterectomy. No adnexal mass. Other: No ascites or free air. The abdominal wall is normal. Musculoskeletal. There is stable height loss of the L4 vertebral body. The patient is status post total hip arthroplasty on the right. The hardware appears grossly intact. Review of the MIP images confirms the above findings. IMPRESSION: 1. No evidence for aortic aneurysm or dissection. 2. Cardiomegaly and coronary artery calcifications. 3. Stable bilateral pulmonary nodules. 4. Rectosigmoid diverticulosis without acute inflammation. 5. Stable  cystic appearing lesions in the pancreatic body. 6. Stable intrahepatic and extrahepatic biliary ductal dilatation. 7. The ectatic infrarenal abdominal aorta measuring 2.8 cm. Recommend follow-up every 5 years. This recommendation follows ACR consensus guidelines: White Paper of the ACR Incidental Findings Committee II on Vascular Findings. J Am Coll Radiol 2013; 10:789-794. Aortic Atherosclerosis (ICD10-I70.0). Electronically Signed   By: Constance Holster M.D.   On: 09/27/2020 19:52    ____________________________________________  PROCEDURES   Procedure(s) performed (including Critical Care):  Procedures  ____________________________________________  INITIAL IMPRESSION / MDM / [place] / ED COURSE  As part of my medical decision making, I reviewed the following data within the [place] notes reviewed and incorporated, Old chart reviewed, Notes from prior ED visits, and [place] Controlled Substance Database       *Adrielle A Sol was evaluated in Emergency Department on 09/27/2020 for the symptoms described in the history of present illness. She was evaluated in the context of the global COVID-19 pandemic, which necessitated consideration that the patient might be at risk for infection with the SARS-CoV-2 virus that causes COVID-19. Institutional protocols and algorithms that pertain to the evaluation of patients at risk for COVID-19 are in a state of rapid change based on information released by regulatory bodies including the CDC and federal and state organizations. These policies and algorithms were followed during the patient's care in the ED.  Some ED evaluations and interventions may be delayed as a result of limited staffing during the pandemic.*     Medical Decision Making: 55-year-old female here with  somewhat atypical, positional, sharp back and chest pain.  The pain seems localized primarily to the thoracolumbar spine, and clinically, suspect this  could be due to known chronic compression fractures and degenerative disease in this area.  The pain seems worse with palpation of the back, as well as position changes.  Denies any specific falls or trauma, but has been coughing and has been sneezing due to allergies, which could have exacerbated her chronic disease.  Otherwise, EKG shows no acute changes and troponins are negative x2 despite symptoms throughout the day, making ACS unlikely.  She is already on Coumadin, has no evidence of hypoxia or other signs of PE, and his CT dissection study obtained secondary to her age and history, and shows no evidence of dissection, PE, or other acute abnormality.  She has an ectatic aorta, but this can be monitored with outpatient scans every 5 years.  She feels better with analgesia here.  She would like to attempt outpatient management which given her negative labs and work-up, is reasonable.  Will have her schedule her tramadol, which she has tolerated well in the past, as well start lidocaine patches.  Due to the concern for acute inflammation as well, she was given a low dose of Decadron here to help with this.  Will have her follow-up with her PCP early next week.  ____________________________________________  FINAL CLINICAL IMPRESSION(S) / ED DIAGNOSES  Final diagnoses:  DDD (degenerative disc disease), thoracic  Atypical chest pain     MEDICATIONS GIVEN DURING THIS VISIT:  Medications  morphine 2 MG/ML injection 2 mg (2 mg Intravenous Not Given 09/27/20 1817)  alum & mag hydroxide-simeth (MAALOX/MYLANTA) 200-200-20 MG/5ML suspension 30 mL (30 mLs Oral Given 09/27/20 1818)    And  lidocaine (XYLOCAINE) 2 % viscous mouth solution 15 mL (15 mLs Oral Given 09/27/20 1818)  acetaminophen (TYLENOL) tablet 650 mg (650 mg Oral Given 09/27/20 1818)  morphine 4 MG/ML injection (2 mg  Given 09/27/20 1817)  iohexol (OMNIPAQUE) 350 MG/ML injection 100 mL (100 mLs Intravenous Contrast Given 09/27/20 1909)    dexamethasone (DECADRON) injection 4 mg (4 mg Intravenous Given 09/27/20 2104)     ED Discharge Orders         Ordered    lidocaine (LIDODERM) 5 %  Every 12 hours        09/27/20 2028           Note:  This document was prepared using Dragon voice recognition software and may include unintentional dictation errors.   Duffy Bruce, MD 09/27/20 2113

## 2020-09-27 NOTE — ED Notes (Signed)
ED Provider at bedside. 

## 2020-09-27 NOTE — ED Triage Notes (Signed)
Pt in via EMS from independent living facility with c/o CP. Pt took 2 nitro prior to calling EMS and 1 nitro after EMS arrived. Pt was given 324 mg of aspirin by EMS  120/67, 97-99% RA, 100-102 HR

## 2020-09-27 NOTE — ED Notes (Signed)
Patient transported to CT 

## 2021-02-08 ENCOUNTER — Encounter: Payer: Self-pay | Admitting: Emergency Medicine

## 2021-02-08 ENCOUNTER — Other Ambulatory Visit: Payer: Self-pay

## 2021-02-08 ENCOUNTER — Emergency Department
Admission: EM | Admit: 2021-02-08 | Discharge: 2021-02-08 | Disposition: A | Discharge status: Home / Self Care | Payer: Medicare Other | Attending: Emergency Medicine | Admitting: Emergency Medicine

## 2021-02-08 ENCOUNTER — Emergency Department: Payer: Medicare Other

## 2021-02-08 DIAGNOSIS — I2510 Atherosclerotic heart disease of native coronary artery without angina pectoris: Secondary | ICD-10-CM | POA: Insufficient documentation

## 2021-02-08 DIAGNOSIS — R079 Chest pain, unspecified: Secondary | ICD-10-CM

## 2021-02-08 DIAGNOSIS — Z7901 Long term (current) use of anticoagulants: Secondary | ICD-10-CM | POA: Diagnosis not present

## 2021-02-08 DIAGNOSIS — R0789 Other chest pain: Secondary | ICD-10-CM | POA: Diagnosis not present

## 2021-02-08 DIAGNOSIS — Z853 Personal history of malignant neoplasm of breast: Secondary | ICD-10-CM | POA: Insufficient documentation

## 2021-02-08 DIAGNOSIS — Z96641 Presence of right artificial hip joint: Secondary | ICD-10-CM | POA: Insufficient documentation

## 2021-02-08 DIAGNOSIS — Z79899 Other long term (current) drug therapy: Secondary | ICD-10-CM | POA: Insufficient documentation

## 2021-02-08 DIAGNOSIS — I10 Essential (primary) hypertension: Secondary | ICD-10-CM | POA: Insufficient documentation

## 2021-02-08 DIAGNOSIS — R0602 Shortness of breath: Secondary | ICD-10-CM | POA: Insufficient documentation

## 2021-02-08 DIAGNOSIS — Z7982 Long term (current) use of aspirin: Secondary | ICD-10-CM | POA: Diagnosis not present

## 2021-02-08 DIAGNOSIS — Z20822 Contact with and (suspected) exposure to covid-19: Secondary | ICD-10-CM | POA: Insufficient documentation

## 2021-02-08 LAB — RESP PANEL BY RT-PCR (FLU A&B, COVID) ARPGX2
Influenza A by PCR: NEGATIVE
Influenza B by PCR: NEGATIVE
SARS Coronavirus 2 by RT PCR: NEGATIVE

## 2021-02-08 LAB — CBC WITH DIFFERENTIAL/PLATELET
Abs Immature Granulocytes: 0.02 10*3/uL (ref 0.00–0.07)
Basophils Absolute: 0 10*3/uL (ref 0.0–0.1)
Basophils Relative: 1 %
Eosinophils Absolute: 0.3 10*3/uL (ref 0.0–0.5)
Eosinophils Relative: 5 %
HCT: 37.5 % (ref 36.0–46.0)
Hemoglobin: 12.2 g/dL (ref 12.0–15.0)
Immature Granulocytes: 0 %
Lymphocytes Relative: 28 %
Lymphs Abs: 1.7 10*3/uL (ref 0.7–4.0)
MCH: 31.6 pg (ref 26.0–34.0)
MCHC: 32.5 g/dL (ref 30.0–36.0)
MCV: 97.2 fL (ref 80.0–100.0)
Monocytes Absolute: 0.6 10*3/uL (ref 0.1–1.0)
Monocytes Relative: 10 %
Neutro Abs: 3.5 10*3/uL (ref 1.7–7.7)
Neutrophils Relative %: 56 %
Platelets: 246 10*3/uL (ref 150–400)
RBC: 3.86 MIL/uL — ABNORMAL LOW (ref 3.87–5.11)
RDW: 13.3 % (ref 11.5–15.5)
WBC: 6.1 10*3/uL (ref 4.0–10.5)
nRBC: 0.3 % — ABNORMAL HIGH (ref 0.0–0.2)

## 2021-02-08 LAB — COMPREHENSIVE METABOLIC PANEL
ALT: 7 U/L (ref 0–44)
AST: 32 U/L (ref 15–41)
Albumin: 3.7 g/dL (ref 3.5–5.0)
Alkaline Phosphatase: 85 U/L (ref 38–126)
Anion gap: 9 (ref 5–15)
BUN: 21 mg/dL (ref 8–23)
CO2: 23 mmol/L (ref 22–32)
Calcium: 9.2 mg/dL (ref 8.9–10.3)
Chloride: 106 mmol/L (ref 98–111)
Creatinine, Ser: 1.21 mg/dL — ABNORMAL HIGH (ref 0.44–1.00)
GFR, Estimated: 43 mL/min — ABNORMAL LOW (ref >60)
Glucose, Bld: 168 mg/dL — ABNORMAL HIGH (ref 70–99)
Potassium: 4.5 mmol/L (ref 3.5–5.1)
Sodium: 138 mmol/L (ref 135–145)
Total Bilirubin: 1 mg/dL (ref 0.3–1.2)
Total Protein: 7.1 g/dL (ref 6.5–8.1)

## 2021-02-08 LAB — TROPONIN I (HIGH SENSITIVITY)
Troponin I (High Sensitivity): 7 ng/L (ref <18)
Troponin I (High Sensitivity): 7 ng/L (ref <18)

## 2021-02-08 LAB — LIPASE, BLOOD: Lipase: 31 U/L (ref 11–51)

## 2021-02-08 LAB — PROTIME-INR
INR: 1.7 — ABNORMAL HIGH (ref 0.8–1.2)
Prothrombin Time: 19.1 seconds — ABNORMAL HIGH (ref 11.4–15.2)

## 2021-02-08 MED ORDER — NITROGLYCERIN 0.4 MG SL SUBL: 0.4000 mg | SUBLINGUAL_TABLET | SUBLINGUAL | Status: DC | PRN

## 2021-02-08 MED ORDER — IOHEXOL 350 MG/ML SOLN
60.0000 mL | Freq: Once | INTRAVENOUS | Status: AC | PRN
  Administered 2021-02-08: 60 mL via INTRAVENOUS

## 2021-02-08 NOTE — ED Notes (Signed)
[facility] notified that pt would be returning to them

## 2021-02-08 NOTE — ED Provider Notes (Signed)
[facility] Emergency Department Provider Note  ____________________________________________  Time seen: Approximately 8:56 AM  I have reviewed the triage vital signs and the nursing notes.   HISTORY  Chief Complaint Chest pain   HPI Peggy [name] is a 87 y.o. female with a history of atrial fibrillation, diverticulosis, breast cancer status post left mastectomy, hypertension  who comes ED complaining of chest tightness that started yesterday afternoon. Associated with shortness of breath. Denies exertional or pleuritic pain. Tightness radiates to the back and left arm. Moderate intensity, constant. No vomiting or diaphoresis. Complains of feeling cold but no fever, no vomiting or diarrhea.  Pain currently resolved.     Past Medical History:  Diagnosis Date  . Allergy   . Anginal pain ([place])    c/o angina on May 04, 2016 releived by Nitroglycerin X 2  . Anxiety   . Arthritis   . Atrial fibrillation ([place])   . Atrial fibrillation ([place])   . Atrial fibrillation with rapid ventricular response [facility]) November 2015   Heart rate up to 140 requiring IV Cardizem  . Breast cancer [facility]) 2012   Patient underwent a left mastectomy on 03-31-11. Pathology showed DCIS at both sites without an invasive component. Sentinel nodes were negative. The breast lesions were noted to be ER +40%, PR-positive, 5%.   . Breast screening, unspecified   . Coronary artery disease   . Diverticulosis   . Dysrhythmia   . Essential hypertension, benign   . GERD (gastroesophageal reflux disease)   . Hearing disorder 2012   hearing aids  . Heart attack ([place]) 2001  . Heart disease 2001  . Hip pain   . Hyperlipidemia   . Hypertension 1996  . Lump or mass in breast   . Malignant neoplasm of upper-inner quadrant of female breast ([place])   . Malignant neoplasm of upper-outer quadrant of female breast ([place])   . Other benign neoplasm of connective and other soft tissue of thorax   . Personal  history of malignant neoplasm of breast   . Reflux    acid reflux  . Shortness of breath dyspnea    with exertion  . Special screening for malignant neoplasms, colon   . Stroke [facility]) 2009  . Unspecified essential hypertension      Patient Active Problem List   Diagnosis Date Noted  . Rib fracture 01/17/2019  . Chest pain 02/14/2018  . Rectal bleed 09/01/2017  . Atrial flutter ([place]) 12/08/2016  . S/P total hip arthroplasty 02/27/2016  . Hip pain 01/15/2016  . Memory loss 03/21/2015  . Abnormality of gait 03/21/2015  . Atrial fibrillation with RVR ([place]) 03/11/2015  . H/O: CVA (cerebrovascular accident) 03/11/2015  . Right temporal lobe infarction ([place]) 04/10/2014  . Blurred vision 09/05/2013  . 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 EPIGASTRIC PAIN 02/14/2009  . NONSPECIFIC ABN FINDNG RAD&OTH EXAM BILARY TRCT 02/14/2009     Past Surgical History:  Procedure Laterality Date  . ABDOMINAL HYSTERECTOMY     age 71  . APPENDECTOMY     age 18  . BREAST BIOPSY     left breast biopsy over 15 years ago   . CHOLECYSTECTOMY     40 years ago   . COLON SURGERY     Dr. Tamala Julian, [facility]  . COLONOSCOPY  2009   [facility] Dr. Donnella Sham  . EXCISION OF BACK LESION Left 05/09/2016   Procedure: EXCISION OF GLUTEAL MASS;  Surgeon: Robert Bellow, MD;  Location: [facility] ORS;  Service: General;  Laterality: Left;  . EYE SURGERY Bilateral    Cataract Extraction with IOL  . HERNIA REPAIR    . JOINT REPLACEMENT Right    TOTAL HIP REPLACEMENT  . MASTECTOMY  2012   left breast  . SMALL INTESTINE SURGERY  12/31/2012   Abdominal exploration, lysis of adhesions for distal small bowel obstruction. Rochel Brome, MD  . TONSILLECTOMY     as a child  . TOTAL HIP ARTHROPLASTY Right 02/27/2016   Procedure: TOTAL HIP ARTHROPLASTY;  Surgeon: Dereck Leep, MD;  Location: [facility] ORS;  Service: Orthopedics;  Laterality: Right;  . UPPER GI ENDOSCOPY   2009   [facility] Dr. Donnella Sham     Prior to Admission medications   Medication Sig Start Date End Date Taking? Authorizing Provider  acetaminophen (TYLENOL) 325 MG tablet Take 325-650 mg by mouth every 6 (six) hours as needed for mild pain or moderate pain.   Yes [provider]  ALPRAZolam (XANAX) 0.25 MG tablet Take 0.25 mg by mouth daily.   Yes [provider]  aspirin EC 81 MG tablet Take 81 mg by mouth daily.   Yes [provider]  Calcium Carbonate-Vitamin D 600-400 MG-UNIT tablet Take 1 tablet by mouth daily.    Yes [provider]  diltiazem (CARDIZEM CD) 300 MG 24 hr capsule Take 300 mg by mouth daily.   Yes [provider]  isosorbide mononitrate (IMDUR) 30 MG 24 hr tablet Take 30 mg by mouth daily.    Yes [provider]  Menthol, Topical Analgesic, 2.5 % GEL Apply topically daily as needed (for muscle pain).   Yes [provider]  metoprolol tartrate (LOPRESSOR) 25 MG tablet Take 25 mg by mouth 2 (two) times daily.   Yes [provider]  mirtazapine (REMERON) 15 MG tablet Take 15 mg by mouth at bedtime.   Yes [provider]  Multiple Vitamins-Minerals (PRESERVISION AREDS 2 PO) Take 1 tablet by mouth 2 (two) times daily.   Yes [provider]  nitroGLYCERIN (NITROSTAT) 0.6 MG SL tablet Place 0.6 mg under the tongue every 5 (five) minutes as needed for chest pain.   Yes [provider]  Probiotic Product (PROBIOTIC DAILY PO) Take 1 tablet by mouth daily.    Yes [provider]  RABEprazole (ACIPHEX) 20 MG tablet Take 1 tablet (20 mg total) by mouth 2 (two) times daily. 12/11/16  Yes Gladstone Lighter, MD  sertraline (ZOLOFT) 50 MG tablet Take 50 mg by mouth daily.   Yes [provider]  traMADol (ULTRAM) 50 MG tablet Take 50-75 mg by mouth 3 (three) times daily as needed for pain. 12/20/20  Yes [provider]  vitamin B-12 (CYANOCOBALAMIN) 1000 MCG tablet Take  1,000 mcg by mouth at bedtime.    Yes [provider]  warfarin (COUMADIN) 3 MG tablet Take 3 mg by mouth at bedtime.   Yes [provider]     Allergies Alendronate sodium, Amoxicillin-pot clavulanate, Etodolac, Ibandronate sodium, Iodine, Lactose intolerance (gi), Minocycline hcl, Risedronate sodium, Sulfonamide derivatives, and Zoledronic acid   Family History  Problem Relation Age of Onset  . Heart disease Mother   . Stroke Father   . Heart disease Father   . Breast cancer Sister        x 2, in their 66's  . Lung cancer Brother   . Colon cancer Neg Hx   . Ovarian cancer Neg  Hx     Social History Social History   Tobacco Use  . Smoking status: Never Smoker  . Smokeless tobacco: Never Used  Substance Use Topics  . Alcohol use: No    Alcohol/week: 0.0 standard drinks  . Drug use: No    Review of Systems  Constitutional:   No fever positive chills.  ENT:   No sore throat. No rhinorrhea. Cardiovascular: Positive chest pain without syncope. Respiratory: Positive shortness of breath without cough. Gastrointestinal:   Negative for abdominal pain, vomiting and diarrhea.  Musculoskeletal:   Negative for focal pain or swelling All other systems reviewed and are negative except as documented above in ROS and HPI.  ____________________________________________   PHYSICAL EXAM:  VITAL SIGNS: ED Triage Vitals  Enc Vitals Group     BP      Pulse      Resp      Temp      Temp src      SpO2      Weight      Height      Head Circumference      Peak Flow      Pain Score      Pain Loc      Pain Edu?      Excl. in [place]?     Vital signs reviewed, nursing assessments reviewed.   Constitutional:   Alert and oriented. Non-toxic appearance. Eyes:   Conjunctivae are normal. EOMI.  ENT      Head:   Normocephalic and atraumatic.      Nose:   Wearing a mask.      Mouth/Throat:   Wearing a mask.      Neck:   No meningismus. Full  ROM. Hematological/Lymphatic/Immunilogical:   No cervical lymphadenopathy. Cardiovascular:   RRR. Symmetric bilateral radial and DP pulses.  No murmurs. Cap refill less than 2 seconds. Respiratory:   Normal respiratory effort without tachypnea/retractions. Breath sounds are clear and equal bilaterally. No wheezes/rales/rhonchi. Gastrointestinal:   Soft and nontender. Non distended. There is no CVA tenderness.  No rebound, rigidity, or guarding.  Musculoskeletal:   Normal range of motion in all extremities. No joint effusions.  No lower extremity tenderness.  No edema. Neurologic:   Normal speech and language.  Motor grossly intact. No acute focal neurologic deficits are appreciated.  Skin:    Skin is warm, dry and intact. No rash noted.  No petechiae, purpura, or bullae.  ____________________________________________    LABS (pertinent positives/negatives) (all labs ordered are listed, but only abnormal results are displayed) Labs Reviewed  COMPREHENSIVE METABOLIC PANEL - Abnormal; Notable for the following components:      Result Value   Glucose, Bld 168 (*)    Creatinine, Ser 1.21 (*)    GFR, Estimated 43 (*)    All other components within normal limits  CBC WITH DIFFERENTIAL/PLATELET - Abnormal; Notable for the following components:   RBC 3.86 (*)    nRBC 0.3 (*)    All other components within normal limits  PROTIME-INR - Abnormal; Notable for the following components:   Prothrombin Time 19.1 (*)    INR 1.7 (*)    All other components within normal limits  RESP PANEL BY RT-PCR (FLU A&B, COVID) ARPGX2  LIPASE, BLOOD  TROPONIN I (HIGH SENSITIVITY)  TROPONIN I (HIGH SENSITIVITY)   ____________________________________________   EKG  Interpreted by me Sinus rhythm rate of 91, left axis, normal intervals. Poor R wave progression. Voltage criteria for LVH in the  high lateral leads with associated repolarization abnormality. ST segments unremarkable, no acute ischemic  changes  Multiple EKGs performed by EMS reviewed, nondiagnostic for STEMI, no apparent interval changes on ED EKG  ____________________________________________    RADIOLOGY  CT Angio Chest PE W and/or Wo Contrast  Result Date: 02/08/2021 CLINICAL DATA:  Intermittent chest pain/tightness radiating to the left shoulder. EXAM: CT ANGIOGRAPHY CHEST WITH CONTRAST TECHNIQUE: Multidetector CT imaging of the chest was performed using the standard protocol during bolus administration of intravenous contrast. Multiplanar CT image reconstructions and MIPs were obtained to evaluate the vascular anatomy. CONTRAST:  56mL OMNIPAQUE IOHEXOL 350 MG/ML SOLN COMPARISON:  CTA chest, abdomen, and pelvis dated September 27, 2020. FINDINGS: Cardiovascular: Satisfactory opacification of the pulmonary arteries to the segmental level. No evidence of pulmonary embolism. Unchanged mild cardiomegaly. No pericardial effusion. No thoracic aortic aneurysm. Coronary, aortic arch, and branch vessel atherosclerotic vascular disease. Mediastinum/Nodes: No enlarged mediastinal, hilar, or axillary lymph nodes. Thyroid gland, trachea, and esophagus demonstrate no significant findings. Lungs/Pleura: Mild mosaic attenuation in both lungs. No focal consolidation, pleural effusion, or pneumothorax. Unchanged 3 mm pulmonary nodule in the right middle lobe (series 6, image 47), and 4 mm pulmonary nodule in the left lower lobe (series 6, image 47). No new pulmonary nodule. Upper Abdomen: No acute abnormality. Stable intra- and extrahepatic biliary dilatation status post cholecystectomy. Unchanged ectatic infrarenal abdominal aorta measuring up to 2.9 cm. Musculoskeletal: Progressive now moderate T6 vertebral body height loss since the prior study. Other chronic thoracolumbar compression fractures are unchanged. Review of the MIP images confirms the above findings. IMPRESSION: 1. No evidence of pulmonary embolism. 2. Progressive now moderate T6 vertebral  body compression fracture, age indeterminate, but new since October 2021. Correlate with point tenderness. 3. Mild mosaic attenuation in both lungs, suggestive of small airways disease. 4. Aortic Atherosclerosis (ICD10-I70.0). Electronically Signed   By: Titus Dubin M.D.   On: 02/08/2021 11:04   DG Chest Portable 1 View  Result Date: 02/08/2021 CLINICAL DATA:  Chest pain EXAM: PORTABLE CHEST 1 VIEW COMPARISON:  September 27, 2020 FINDINGS: The cardiomediastinal silhouette is unchanged in contour.Tortuous thoracic aorta. Atherosclerotic calcifications of the aorta. No pleural effusion. No pneumothorax. Mild bronchial wall thickening, peribronchial cuffing, and perihilar reticular nodularity, increased in comparison to prior. Unchanged LEFT lateral linear opacities and interstitial prominence. Visualized abdomen is unremarkable. Incomplete assessment of the spine with partial visualization compression fracture deformities at the thoracolumbar junction and lower thoracic spine, similar in comparison to prior. IMPRESSION: 1. Constellation of findings could reflect underlying bronchitis, pulmonary edema or chronic aspiration. Electronically Signed   By: Valentino Saxon MD   On: 02/08/2021 09:09    ____________________________________________   PROCEDURES Procedures  ____________________________________________  DIFFERENTIAL DIAGNOSIS   Non-STEMI, pneumonia, pneumothorax, pleural effusion, pulmonary edema, pulmonary embolism, viral illness  CLINICAL IMPRESSION / ASSESSMENT AND PLAN / ED COURSE  Medications ordered in the ED: Medications  nitroGLYCERIN (NITROSTAT) SL tablet 0.4 mg (has no administration in time range)  iohexol (OMNIPAQUE) 350 MG/ML injection 60 mL (60 mLs Intravenous Contrast Given 02/08/21 1025)    Pertinent labs & imaging results that were available during my care of the patient were reviewed by me and considered in my medical decision making (see chart for  details).  Maysel A Bardon was evaluated in Emergency Department on 02/08/2021 for the symptoms described in the history of present illness. She was evaluated in the context of the global COVID-19 pandemic, which necessitated consideration that the patient might be at  risk for infection with the SARS-CoV-2 virus that causes COVID-19. Institutional protocols and algorithms that pertain to the evaluation of patients at risk for COVID-19 are in a state of rapid change based on information released by regulatory bodies including the CDC and federal and state organizations. These policies and algorithms were followed during the patient's care in the ED.   Patient presents with chest tightness and shortness of breath in the setting of comorbidities, warfarin use for atrial fibrillation. Has a history of breast cancer. Will obtain chest x-ray and labs. If initial work-up is nondiagnostic, will need to proceed with CT scan to rule out PE.   Clinical Course as of 02/08/21 1319  Fri Feb 08, 2021  1005 Troponin normal, Covid negative, chest x-ray viewed and interpreted by me, appears similar to previous, although the radiology report suggests increased peribronchial markings suggestive of bronchitis.  INR is subtherapeutic.  Will need to obtain CT scan to rule out PE. [PS]    Clinical Course User Index [PS] Carrie Mew, MD     ----------------------------------------- 1:20 PM on 02/08/2021 -----------------------------------------  Serial troponins normal, vital signs remain normal.  Patient reports the pain has resolved without receiving any nitroglycerin in the ED, has been asymptomatic since arrival, stable for discharge home.  Recommend outpatient follow-up with her cardiologist.  ____________________________________________   FINAL CLINICAL IMPRESSION(S) / ED DIAGNOSES    Final diagnoses:  Nonspecific chest pain     ED Discharge Orders    None      Portions of this note were  generated with dragon dictation software. Dictation errors may occur despite best attempts at proofreading.   Carrie Mew, MD 02/08/21 1320

## 2021-02-08 NOTE — Discharge Instructions (Signed)
Your test today were all okay.  Please follow-up with your cardiology office for further evaluation of your symptoms.  Continue taking all of your medications as prescribed in the meantime.

## 2021-02-08 NOTE — ED Triage Notes (Signed)
Pt to ER via EMS from [facility] with c/o intermittent CP described as tightness that radiated into left shoulder.  Pt denies pain at present.  Pt also c/o indigestion, with noted belching on assessment.

## 2021-03-02 ENCOUNTER — Emergency Department: Payer: Medicare Other

## 2021-03-02 ENCOUNTER — Emergency Department
Admission: EM | Admit: 2021-03-02 | Discharge: 2021-03-02 | Disposition: A | Discharge status: Home / Self Care | Payer: Medicare Other | Attending: Emergency Medicine | Admitting: Emergency Medicine

## 2021-03-02 ENCOUNTER — Other Ambulatory Visit: Payer: Self-pay

## 2021-03-02 DIAGNOSIS — W228XXA Striking against or struck by other objects, initial encounter: Secondary | ICD-10-CM | POA: Insufficient documentation

## 2021-03-02 DIAGNOSIS — I10 Essential (primary) hypertension: Secondary | ICD-10-CM | POA: Diagnosis not present

## 2021-03-02 DIAGNOSIS — I2510 Atherosclerotic heart disease of native coronary artery without angina pectoris: Secondary | ICD-10-CM | POA: Insufficient documentation

## 2021-03-02 DIAGNOSIS — Z96641 Presence of right artificial hip joint: Secondary | ICD-10-CM | POA: Diagnosis not present

## 2021-03-02 DIAGNOSIS — Z79899 Other long term (current) drug therapy: Secondary | ICD-10-CM | POA: Insufficient documentation

## 2021-03-02 DIAGNOSIS — Z7982 Long term (current) use of aspirin: Secondary | ICD-10-CM | POA: Insufficient documentation

## 2021-03-02 DIAGNOSIS — S0181XA Laceration without foreign body of other part of head, initial encounter: Secondary | ICD-10-CM | POA: Diagnosis not present

## 2021-03-02 DIAGNOSIS — Z853 Personal history of malignant neoplasm of breast: Secondary | ICD-10-CM | POA: Diagnosis not present

## 2021-03-02 DIAGNOSIS — S0990XA Unspecified injury of head, initial encounter: Secondary | ICD-10-CM | POA: Diagnosis present

## 2021-03-02 NOTE — ED Triage Notes (Signed)
As per daughter in law, pt was opening door and door hit pt on the upper right forehead. Pt is on a blood thinner, but cannot recall it. Family states pt did not have loss of LOC, they are just unable to stop the bleeding.

## 2021-03-02 NOTE — ED Notes (Signed)
[place]s PA applied LET to [employer].

## 2021-03-02 NOTE — ED Notes (Signed)
Dr. Archie Balboa made aware of pt and gave verbal for order. Please see orders. Pt is walking with walker, no deficits noted. Pt has bandaids on head with tissue applying pressure.

## 2021-03-02 NOTE — ED Notes (Signed)
[place] applied dressing post suturing of lac.

## 2021-03-02 NOTE — Discharge Instructions (Signed)
Please have sutures removed in five days.

## 2021-03-02 NOTE — ED Notes (Signed)
Pt getting CT completed. Family member at bedside. CT notified by Miquel Dunn to bring pt to room 53 once done.

## 2021-03-02 NOTE — ED Provider Notes (Signed)
[facility]-EMERGENCY DEPARTMENT  ____________________________________________  Time seen: Approximately 9:42 PM  I have reviewed the triage vital signs and the nursing notes.   HISTORY  Chief Complaint Head Laceration   Historian Patient   HPI Peggy [name] is a 87 y.o. female presents to the emergency department with a 1 and half centimeter laceration at mid forehead.  Patient accidentally open a cabinet door into head.  Patient did not lose consciousness.  No neck pain.  No numbness or tingling in the upper and lower extremities.  Patient is up-to-date on her tetanus.  Daughter-in-law is a Marine scientist and states that they came into the emergency department because she cannot control bleeding at home.   Past Medical History:  Diagnosis Date  . Allergy   . Anginal pain ([place])    c/o angina on May 04, 2016 releived by Nitroglycerin X 2  . Anxiety   . Arthritis   . Atrial fibrillation ([place])   . Atrial fibrillation ([place])   . Atrial fibrillation with rapid ventricular response [facility]) November 2015   Heart rate up to 140 requiring IV Cardizem  . Breast cancer [facility]) 2012   Patient underwent a left mastectomy on 03-31-11. Pathology showed DCIS at both sites without an invasive component. Sentinel nodes were negative. The breast lesions were noted to be ER +40%, PR-positive, 5%.   . Breast screening, unspecified   . Coronary artery disease   . Diverticulosis   . Dysrhythmia   . Essential hypertension, benign   . GERD (gastroesophageal reflux disease)   . Hearing disorder 2012   hearing aids  . Heart attack ([place]) 2001  . Heart disease 2001  . Hip pain   . Hyperlipidemia   . Hypertension 1996  . Lump or mass in breast   . Malignant neoplasm of upper-inner quadrant of female breast ([place])   . Malignant neoplasm of upper-outer quadrant of female breast ([place])   . Other benign neoplasm of connective and other soft tissue of thorax   . Personal history of malignant neoplasm of breast   .  Reflux    acid reflux  . Shortness of breath dyspnea    with exertion  . Special screening for malignant neoplasms, colon   . Stroke [facility]) 2009  . Unspecified essential hypertension      Immunizations up to date:  Yes.     Past Medical History:  Diagnosis Date  . Allergy   . Anginal pain ([place])    c/o angina on May 04, 2016 releived by Nitroglycerin X 2  . Anxiety   . Arthritis   . Atrial fibrillation ([place])   . Atrial fibrillation ([place])   . Atrial fibrillation with rapid ventricular response [facility]) November 2015   Heart rate up to 140 requiring IV Cardizem  . Breast cancer [facility]) 2012   Patient underwent a left mastectomy on 03-31-11. Pathology showed DCIS at both sites without an invasive component. Sentinel nodes were negative. The breast lesions were noted to be ER +40%, PR-positive, 5%.   . Breast screening, unspecified   . Coronary artery disease   . Diverticulosis   . Dysrhythmia   . Essential hypertension, benign   . GERD (gastroesophageal reflux disease)   . Hearing disorder 2012   hearing aids  . Heart attack ([place]) 2001  . Heart disease 2001  . Hip pain   . Hyperlipidemia   . Hypertension 1996  . Lump or mass in breast   . Malignant neoplasm of upper-inner quadrant of  female breast ([place])   . Malignant neoplasm of upper-outer quadrant of female breast ([place])   . Other benign neoplasm of connective and other soft tissue of thorax   . Personal history of malignant neoplasm of breast   . Reflux    acid reflux  . Shortness of breath dyspnea    with exertion  . Special screening for malignant neoplasms, colon   . Stroke [facility]) 2009  . Unspecified essential hypertension     Patient Active Problem List   Diagnosis Date Noted  . Rib fracture 01/17/2019  . Chest pain 02/14/2018  . Rectal bleed 09/01/2017  . Atrial flutter ([place]) 12/08/2016  . S/P total hip arthroplasty 02/27/2016  . Hip pain 01/15/2016  . Memory loss 03/21/2015  . Abnormality of gait 03/21/2015   . Atrial fibrillation with RVR ([place]) 03/11/2015  . H/O: CVA (cerebrovascular accident) 03/11/2015  . 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 Blurred vision 09/05/2013  . 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 EPIGASTRIC PAIN 02/14/2009  . NONSPECIFIC ABN FINDNG RAD&OTH EXAM BILARY TRCT 02/14/2009    Past Surgical History:  Procedure Laterality Date  . ABDOMINAL HYSTERECTOMY     age 59  . APPENDECTOMY     age 39  . BREAST BIOPSY     left breast biopsy over 15 years ago   . CHOLECYSTECTOMY     40 years ago   . COLON SURGERY     Dr. Tamala Julian, [facility]  . COLONOSCOPY  2009   [facility] Dr. Donnella Sham  . EXCISION OF BACK LESION Left 05/09/2016   Procedure: EXCISION OF GLUTEAL MASS;  Surgeon: Robert Bellow, MD;  Location: [facility] ORS;  Service: General;  Laterality: Left;  . EYE SURGERY Bilateral    Cataract Extraction with IOL  . HERNIA REPAIR    . JOINT REPLACEMENT Right    TOTAL HIP REPLACEMENT  . MASTECTOMY  2012   left breast  . SMALL INTESTINE SURGERY  12/31/2012   Abdominal exploration, lysis of adhesions for distal small bowel obstruction. Rochel Brome, MD  . TONSILLECTOMY     as a child  . TOTAL HIP ARTHROPLASTY Right 02/27/2016   Procedure: TOTAL HIP ARTHROPLASTY;  Surgeon: Dereck Leep, MD;  Location: [facility] ORS;  Service: Orthopedics;  Laterality: Right;  . UPPER GI ENDOSCOPY  2009   [facility] Dr. Donnella Sham    Prior to Admission medications   Medication Sig Start Date End Date Taking? Authorizing Provider  acetaminophen (TYLENOL) 325 MG tablet Take 325-650 mg by mouth every 6 (six) hours as needed for mild pain or moderate pain.    [provider]  ALPRAZolam Duanne Moron) 0.25 MG tablet Take 0.25 mg by mouth daily.    [provider]  aspirin EC 81 MG tablet Take 81 mg by mouth daily.    [provider]  Calcium Carbonate-Vitamin D 600-400 MG-UNIT tablet Take 1 tablet by mouth daily.      [provider]  diltiazem (CARDIZEM CD) 300 MG 24 hr capsule Take 300 mg by mouth daily.    [provider]  isosorbide mononitrate (IMDUR) 30 MG 24 hr tablet Take 30 mg by mouth daily.     [provider]  Menthol, Topical Analgesic, 2.5 % GEL Apply topically daily as needed (for muscle pain).    [provider]  metoprolol tartrate (LOPRESSOR) 25 MG tablet Take 25 mg by mouth 2 (two) times daily.  [provider]  mirtazapine (REMERON) 15 MG tablet Take 15 mg by mouth at bedtime.    [provider]  Multiple Vitamins-Minerals (PRESERVISION AREDS 2 PO) Take 1 tablet by mouth 2 (two) times daily.    [provider]  nitroGLYCERIN (NITROSTAT) 0.6 MG SL tablet Place 0.6 mg under the tongue every 5 (five) minutes as needed for chest pain.    [provider]  Probiotic Product (PROBIOTIC DAILY PO) Take 1 tablet by mouth daily.     [provider]  RABEprazole (ACIPHEX) 20 MG tablet Take 1 tablet (20 mg total) by mouth 2 (two) times daily. 12/11/16   Gladstone Lighter, MD  sertraline (ZOLOFT) 50 MG tablet Take 50 mg by mouth daily.    [provider]  traMADol (ULTRAM) 50 MG tablet Take 50-75 mg by mouth 3 (three) times daily as needed for pain. 12/20/20   [provider]  vitamin B-12 (CYANOCOBALAMIN) 1000 MCG tablet Take 1,000 mcg by mouth at bedtime.     [provider]  warfarin (COUMADIN) 3 MG tablet Take 3 mg by mouth at bedtime.    [provider]    Allergies Alendronate sodium, Amoxicillin-pot clavulanate, Etodolac, Ibandronate sodium, Iodine, Lactose intolerance (gi), Minocycline hcl, Risedronate sodium, Sulfonamide derivatives, and Zoledronic acid  Family History  Problem Relation Age of Onset  . Heart disease Mother   . Stroke Father   . Heart disease Father   . Breast cancer Sister        x 2, in their 44's  . Lung cancer Brother   . Colon cancer Neg Hx   .  Ovarian cancer Neg Hx     Social History Social History   Tobacco Use  . Smoking status: Never Smoker  . Smokeless tobacco: Never Used  Substance Use Topics  . Alcohol use: No    Alcohol/week: 0.0 standard drinks  . Drug use: No     Review of Systems  Constitutional: No fever/chills Eyes:  No discharge ENT: No upper respiratory complaints. Respiratory: no cough. No SOB/ use of accessory muscles to breath Gastrointestinal:   No nausea, no vomiting.  No diarrhea.  No constipation. Musculoskeletal: Negative for musculoskeletal pain. Skin: Patient has facial laceration. .   ____________________________________________   PHYSICAL EXAM:  VITAL SIGNS: ED Triage Vitals [03/02/21 1931]  Enc Vitals Group     BP (!) 161/88     Pulse Rate 79     Resp 16     Temp 98.4 F (36.9 C)     Temp src      SpO2 96 %     Weight 101 lb (45.8 kg)     Height 4\' 8"  (1.422 m)     Head Circumference      Peak Flow      Pain Score 0     Pain Loc      Pain Edu?      Excl. in [place]?      Constitutional: Alert and oriented. Well appearing and in no acute distress. Eyes: Conjunctivae are normal. PERRL. EOMI. Head: Atraumatic. ENT:      Nose: No congestion/rhinnorhea.      Mouth/Throat: Mucous membranes are moist.  Neck: No stridor.  No cervical spine tenderness to palpation. Cardiovascular: Normal rate, regular rhythm. Normal S1 and S2.  Good peripheral circulation. Respiratory: Normal respiratory effort without tachypnea or retractions. Lungs CTAB. Good air entry to the bases with no decreased or absent breath sounds Gastrointestinal: Bowel sounds  x 4 quadrants. Soft and nontender to palpation. No guarding or rigidity. No distention. Musculoskeletal: Full range of motion to all extremities. No obvious deformities noted Neurologic:  Normal for age. No gross focal neurologic deficits are appreciated.  Skin: Patient has 1-1/2 cm midline forehead laceration deep to underlying adipose tissue.   Laceration is vertical and well approximated. Psychiatric: Mood and affect are normal for age. Speech and behavior are normal.   ____________________________________________   LABS (all labs ordered are listed, but only abnormal results are displayed)  Labs Reviewed - No data to display ____________________________________________  EKG   ____________________________________________  RADIOLOGY Unk Pinto, personally viewed and evaluated these images (plain radiographs) as part of my medical decision making, as well as reviewing the written report by the radiologist.  CT Head Wo Contrast  Result Date: 03/02/2021 CLINICAL DATA:  Status post trauma. EXAM: CT HEAD WITHOUT CONTRAST TECHNIQUE: Contiguous axial images were obtained from the base of the skull through the vertex without intravenous contrast. COMPARISON:  June 05, 2016 FINDINGS: Brain: There is mild cerebral atrophy with widening of the extra-axial spaces and ventricular dilatation. There are areas of decreased attenuation within the white matter tracts of the supratentorial brain, consistent with microvascular disease changes. Vascular: No hyperdense vessel or unexpected calcification. Skull: Normal. Negative for fracture or focal lesion. Sinuses/Orbits: No acute finding. Other: Mild right frontal scalp soft tissue swelling is seen. IMPRESSION: Mild right frontal scalp soft tissue swelling without evidence of an acute fracture or acute intracranial abnormality. Electronically Signed   By: Virgina Norfolk M.D.   On: 03/02/2021 20:18    ____________________________________________    PROCEDURES  Procedure(s) performed:     Marland KitchenMarland KitchenLaceration Repair  Date/Time: 03/02/2021 9:45 PM Performed by: Lannie Fields, PA-C Authorized by: Lannie Fields, PA-C   Consent:    Consent obtained:  Verbal   Consent given by:  Patient   Risks discussed:  Infection and pain Universal protocol:    Procedure explained and questions  answered to patient or proxy's satisfaction: yes     Patient identity confirmed:  Verbally with patient Anesthesia:    Anesthesia method:  None Laceration details:    Location:  Face   Face location:  Forehead   Length (cm):  1.5   Depth (mm):  5 Pre-procedure details:    Preparation:  Patient was prepped and draped in usual sterile fashion Exploration:    Limited defect created (wound extended): no     Contaminated: no   Treatment:    Area cleansed with:  Povidone-iodine   Amount of cleaning:  Standard   Irrigation solution:  Sterile saline   Visualized foreign bodies/material removed: no     Debridement:  None Skin repair:    Repair method:  Sutures   Suture size:  5-0   Suture material:  Nylon   Number of sutures:  5 Approximation:    Approximation:  Close Repair type:    Repair type:  Simple Post-procedure details:    Dressing:  Open (no dressing)   Procedure completion:  Tolerated well, no immediate complications       Medications - No data to display   ____________________________________________   INITIAL IMPRESSION / ASSESSMENT AND PLAN / ED COURSE  Pertinent labs & imaging results that were available during my care of the patient were reviewed by me and considered in my medical decision making (see chart for details).       Assessment and plan Facial laceration 87 year old female  presents to the emergency department with a facial laceration after she opened a cabinet into face.  Patient was hypertensive at triage vital signs were otherwise reassuring.  Laceration was repaired in the emergency department without complication.  CT head revealed no evidence of skull fracture or intracranial bleed.  Patient was advised to have sutures removed by primary care in 5 days.  Return precautions were given to return with new or worsening symptoms.  All patient questions were answered.    ____________________________________________  FINAL CLINICAL  IMPRESSION(S) / ED DIAGNOSES  Final diagnoses:  Laceration of forehead, initial encounter      NEW MEDICATIONS STARTED DURING THIS VISIT:  ED Discharge Orders    None          This chart was dictated using voice recognition software/Dragon. Despite best efforts to proofread, errors can occur which can change the meaning. Any change was purely unintentional.  '   Karren Cobble 03/02/21 2155    Nance Pear, MD 03/02/21 [phone]

## 2021-03-02 NOTE — ED Notes (Signed)
Pt in with small/deep lac to R forehead after accidentally opening car door too quickly. Bleeding currently controlled. Family at bedside states baseline for pt to experience short term memory issues. Per family, pt acting her baseline.

## 2021-03-02 NOTE — ED Notes (Signed)
Family states pt takes BP meds; states pt already took her evening dose.

## 2023-05-30 ENCOUNTER — Emergency Department
Admission: EM | Admit: 2023-05-30 | Discharge: 2023-05-30 | Disposition: A | Discharge status: Home / Self Care | Payer: Medicare Other | Attending: Emergency Medicine | Admitting: Emergency Medicine

## 2023-05-30 ENCOUNTER — Other Ambulatory Visit: Payer: Self-pay

## 2023-05-30 ENCOUNTER — Emergency Department: Payer: Medicare Other

## 2023-05-30 ENCOUNTER — Encounter: Payer: Self-pay | Admitting: Emergency Medicine

## 2023-05-30 DIAGNOSIS — Z7901 Long term (current) use of anticoagulants: Secondary | ICD-10-CM | POA: Diagnosis not present

## 2023-05-30 DIAGNOSIS — I2510 Atherosclerotic heart disease of native coronary artery without angina pectoris: Secondary | ICD-10-CM | POA: Insufficient documentation

## 2023-05-30 DIAGNOSIS — Z8673 Personal history of transient ischemic attack (TIA), and cerebral infarction without residual deficits: Secondary | ICD-10-CM | POA: Diagnosis not present

## 2023-05-30 DIAGNOSIS — I4892 Unspecified atrial flutter: Secondary | ICD-10-CM | POA: Insufficient documentation

## 2023-05-30 DIAGNOSIS — I10 Essential (primary) hypertension: Secondary | ICD-10-CM | POA: Insufficient documentation

## 2023-05-30 DIAGNOSIS — R002 Palpitations: Secondary | ICD-10-CM | POA: Diagnosis present

## 2023-05-30 LAB — BASIC METABOLIC PANEL
Anion gap: 13 (ref 5–15)
BUN: 21 mg/dL (ref 8–23)
CO2: 22 mmol/L (ref 22–32)
Calcium: 8.6 mg/dL — ABNORMAL LOW (ref 8.9–10.3)
Chloride: 101 mmol/L (ref 98–111)
Creatinine, Ser: 1.26 mg/dL — ABNORMAL HIGH (ref 0.44–1.00)
GFR, Estimated: 40 mL/min — ABNORMAL LOW (ref >60)
Glucose, Bld: 109 mg/dL — ABNORMAL HIGH (ref 70–99)
Potassium: 4.3 mmol/L (ref 3.5–5.1)
Sodium: 136 mmol/L (ref 135–145)

## 2023-05-30 LAB — CBC
HCT: 37 % (ref 36.0–46.0)
Hemoglobin: 12 g/dL (ref 12.0–15.0)
MCH: 30.2 pg (ref 26.0–34.0)
MCHC: 32.4 g/dL (ref 30.0–36.0)
MCV: 93.2 fL (ref 80.0–100.0)
Platelets: 261 10*3/uL (ref 150–400)
RBC: 3.97 MIL/uL (ref 3.87–5.11)
RDW: 13.8 % (ref 11.5–15.5)
WBC: 7.3 10*3/uL (ref 4.0–10.5)
nRBC: 0 % (ref 0.0–0.2)

## 2023-05-30 LAB — PROTIME-INR
INR: 1.9 — ABNORMAL HIGH (ref 0.8–1.2)
Prothrombin Time: 22.1 seconds — ABNORMAL HIGH (ref 11.4–15.2)

## 2023-05-30 NOTE — ED Provider Notes (Signed)
[facility] Provider Note    Event Date/Time   First MD Initiated Contact with Patient 05/30/23 1348     (approximate)   History   Chief Complaint Palpitations   HPI  Peggy [name] is a 89 y.o. female with past medical history of hypertension, stroke, CAD, and chronic atrial fibrillation on Coumadin who presents to the ED complaining of palpitations.  Patient reportedly dealing with elevated heart rate and her blood pressure noted by staff at [facility] earlier today.  Patient states that she feels fine and is not sure why she is here.  Daughter reports that she seems to be at her baseline and has not had any difficulty breathing recently.  Patient specifically denies any fevers, cough, shortness of breath, or chest pain.  She has not noticed any pain or swelling in her legs.  Per daughter, patient has been taking medications as prescribed, has not had any recent changes in her rate control medication recently.     Physical Exam   Triage Vital Signs: ED Triage Vitals  Enc Vitals Group     BP 05/30/23 1311 114/74     Pulse Rate 05/30/23 1308 82     Resp 05/30/23 1308 18     Temp --      Temp Source 05/30/23 1308 Oral     SpO2 05/30/23 1308 94 %     Weight 05/30/23 1309 99 lb 3.3 oz (45 kg)     Height 05/30/23 1309 4\' 8"  (1.422 m)     Head Circumference --      Peak Flow --      Pain Score 05/30/23 1309 0     Pain Loc --      Pain Edu? --      Excl. in [place]? --     Most recent vital signs: Vitals:   05/30/23 1308 05/30/23 1311  BP:  114/74  Pulse: 82   Resp: 18   SpO2: 94%     Constitutional: Alert and oriented. Eyes: Conjunctivae are normal. Head: Atraumatic. Nose: No congestion/rhinnorhea. Mouth/Throat: Mucous membranes are moist.  Cardiovascular: Normal rate, irregularly irregular rhythm. Grossly normal heart sounds.  2+ radial pulses bilaterally. Respiratory: Normal respiratory effort.  No retractions. Lungs  CTAB. Gastrointestinal: Soft and nontender. No distention. Musculoskeletal: No lower extremity tenderness nor edema.  Neurologic:  Normal speech and language. No gross focal neurologic deficits are appreciated.    ED Results / Procedures / Treatments   Labs (all labs ordered are listed, but only abnormal results are displayed) Labs Reviewed  BASIC METABOLIC PANEL - Abnormal; Notable for the following components:      Result Value   Glucose, Bld 109 (*)    Creatinine, Ser 1.26 (*)    Calcium 8.6 (*)    GFR, Estimated 40 (*)    All other components within normal limits  PROTIME-INR - Abnormal; Notable for the following components:   Prothrombin Time 22.1 (*)    INR 1.9 (*)    All other components within normal limits  CBC     EKG  ED ECG REPORT I, Chesley Noon, the attending physician, personally viewed and interpreted this ECG.   Date: 05/30/2023  EKG Time: 13:16  Rate: 79  Rhythm: Atrial flutter  Axis: LAD  Intervals:none  ST&T Change: None  RADIOLOGY Chest x-ray reviewed and interpreted by me with diffuse interstitial infiltrates, no focal infiltrate or effusion noted.  PROCEDURES:  Critical Care performed: No  Procedures   MEDICATIONS ORDERED IN ED: Medications - No data to display   IMPRESSION / MDM / ASSESSMENT AND PLAN / ED COURSE  I reviewed the triage vital signs and the nursing notes.                              89 y.o. female with past medical history of hypertension, stroke, CAD, and atrial fibrillation on Coumadin who presents to the ED due to concerns for elevated heart rate at her nursing facility.  Patient's presentation is most consistent with acute presentation with potential threat to life or bodily function.  Differential diagnosis includes, but is not limited to, arrhythmia, ACS, electrolyte abnormality, AKI, CHF.  Patient well-appearing and in no acute distress, vital signs are unremarkable.  EKG shows atrial flutter with  variable block, rate is controlled and no ischemic changes noted.  Patient is currently asymptomatic, appears to deal with chronic atrial fibrillation per cardiology notes.  Renal function stable compared to previous with no acute electrolyte abnormality, no significant anemia or leukocytosis noted.  Patient does have increased reticular markings on chest x-ray, could represent atypical infection versus mild pulmonary edema.  No symptoms to suggest infectious process at this time and patient does not appear clinically fluid overloaded.  I would hold off on aggressive diuresis at this time given her CKD.  INR slightly subtherapeutic at 1.9, which daughter was informed of.  Overall, patient appropriate for discharge home with cardiology follow-up.  Daughter counseled to have patient return to the ED for new or worsening symptoms, daughter agrees with plan.      FINAL CLINICAL IMPRESSION(S) / ED DIAGNOSES   Final diagnoses:  Atrial flutter, unspecified type [facility])     Rx / DC Orders   ED Discharge Orders          Ordered    Ambulatory referral to Cardiology        05/30/23 1426             Note:  This document was prepared using Dragon voice recognition software and may include unintentional dictation errors.   Chesley Noon, MD 05/30/23 [phone]

## 2023-05-30 NOTE — ED Triage Notes (Signed)
Pt to ED via [facility] from [facility] [place]. Per pts daughter, [facility] thought that patient was having issues with her A. Fib. Pt denies any complaints at this time. Pt was in A. Fib with EMS but rate was controlled.

## 2023-05-30 NOTE — ED Notes (Signed)
First Nurse Note: Pt to ED via [facility] from [facility] [place]. Staff reported that pt has been short of breath for the last 3 days. Pt denies shortness of breath and does not know why they sent her out. Pt hx/o A. Fib. Pt was in A. Fib with EMS with a rate of 80-100. Pt daughter here at the ED with her. Pt is in NAD.

## 2024-07-28 ENCOUNTER — Other Ambulatory Visit: Payer: Self-pay

## 2024-07-28 ENCOUNTER — Inpatient Hospital Stay
Admission: EM | Admit: 2024-07-28 | Discharge: 2024-07-30 | DRG: 189 | Disposition: A | Discharge status: Skilled Nursing Facility | Attending: Internal Medicine | Admitting: Internal Medicine

## 2024-07-28 DIAGNOSIS — I4892 Unspecified atrial flutter: Secondary | ICD-10-CM | POA: Diagnosis present

## 2024-07-28 DIAGNOSIS — I252 Old myocardial infarction: Secondary | ICD-10-CM

## 2024-07-28 DIAGNOSIS — D631 Anemia in chronic kidney disease: Secondary | ICD-10-CM | POA: Diagnosis present

## 2024-07-28 DIAGNOSIS — S0181XA Laceration without foreign body of other part of head, initial encounter: Secondary | ICD-10-CM | POA: Diagnosis present

## 2024-07-28 DIAGNOSIS — J810 Acute pulmonary edema: Principal | ICD-10-CM | POA: Diagnosis present

## 2024-07-28 DIAGNOSIS — Z7982 Long term (current) use of aspirin: Secondary | ICD-10-CM

## 2024-07-28 DIAGNOSIS — I350 Nonrheumatic aortic (valve) stenosis: Secondary | ICD-10-CM | POA: Diagnosis present

## 2024-07-28 DIAGNOSIS — W1830XA Fall on same level, unspecified, initial encounter: Secondary | ICD-10-CM | POA: Diagnosis present

## 2024-07-28 DIAGNOSIS — Z8673 Personal history of transient ischemic attack (TIA), and cerebral infarction without residual deficits: Secondary | ICD-10-CM

## 2024-07-28 DIAGNOSIS — R7989 Other specified abnormal findings of blood chemistry: Secondary | ICD-10-CM | POA: Diagnosis present

## 2024-07-28 DIAGNOSIS — Z66 Do not resuscitate: Secondary | ICD-10-CM | POA: Diagnosis present

## 2024-07-28 DIAGNOSIS — Y92099 Unspecified place in other non-institutional residence as the place of occurrence of the external cause: Secondary | ICD-10-CM

## 2024-07-28 DIAGNOSIS — S62512A Displaced fracture of proximal phalanx of left thumb, initial encounter for closed fracture: Secondary | ICD-10-CM

## 2024-07-28 DIAGNOSIS — E785 Hyperlipidemia, unspecified: Secondary | ICD-10-CM | POA: Diagnosis present

## 2024-07-28 DIAGNOSIS — R296 Repeated falls: Principal | ICD-10-CM | POA: Diagnosis present

## 2024-07-28 DIAGNOSIS — Z96641 Presence of right artificial hip joint: Secondary | ICD-10-CM | POA: Diagnosis present

## 2024-07-28 DIAGNOSIS — Z23 Encounter for immunization: Secondary | ICD-10-CM

## 2024-07-28 DIAGNOSIS — S0990XA Unspecified injury of head, initial encounter: Secondary | ICD-10-CM

## 2024-07-28 DIAGNOSIS — I10 Essential (primary) hypertension: Secondary | ICD-10-CM

## 2024-07-28 DIAGNOSIS — I129 Hypertensive chronic kidney disease with stage 1 through stage 4 chronic kidney disease, or unspecified chronic kidney disease: Secondary | ICD-10-CM | POA: Diagnosis present

## 2024-07-28 DIAGNOSIS — D539 Nutritional anemia, unspecified: Secondary | ICD-10-CM | POA: Diagnosis present

## 2024-07-28 DIAGNOSIS — I2510 Atherosclerotic heart disease of native coronary artery without angina pectoris: Secondary | ICD-10-CM | POA: Diagnosis present

## 2024-07-28 DIAGNOSIS — S62515A Nondisplaced fracture of proximal phalanx of left thumb, initial encounter for closed fracture: Secondary | ICD-10-CM | POA: Diagnosis present

## 2024-07-28 DIAGNOSIS — Z8249 Family history of ischemic heart disease and other diseases of the circulatory system: Secondary | ICD-10-CM

## 2024-07-28 DIAGNOSIS — N1832 Chronic kidney disease, stage 3b: Secondary | ICD-10-CM | POA: Diagnosis present

## 2024-07-28 DIAGNOSIS — H9190 Unspecified hearing loss, unspecified ear: Secondary | ICD-10-CM | POA: Diagnosis present

## 2024-07-28 DIAGNOSIS — Z7901 Long term (current) use of anticoagulants: Secondary | ICD-10-CM

## 2024-07-28 DIAGNOSIS — F0390 Unspecified dementia without behavioral disturbance: Secondary | ICD-10-CM | POA: Diagnosis present

## 2024-07-28 DIAGNOSIS — Z79899 Other long term (current) drug therapy: Secondary | ICD-10-CM

## 2024-07-28 DIAGNOSIS — J9601 Acute respiratory failure with hypoxia: Secondary | ICD-10-CM | POA: Diagnosis present

## 2024-07-28 DIAGNOSIS — Z9012 Acquired absence of left breast and nipple: Secondary | ICD-10-CM

## 2024-07-28 DIAGNOSIS — I4891 Unspecified atrial fibrillation: Secondary | ICD-10-CM | POA: Diagnosis present

## 2024-07-28 NOTE — ED Triage Notes (Addendum)
 Pt arrives via [facility] from [facility] for an unwitnessed fall. Unknown LOC. Takes Warfarin. Facility reported to EMS that pt was found by staff at facility on the floor in her room. Hematoma with dried blood noted to pt's right temple. Denies pain. DNR form sent in with pt. On O2 via [place] @ 3 LPM. EMS reports pt was in the lower 90s on RA. Sats increased to 100% on 3 L. Hx of chronic a-fib, vascular dementia, cerebrovascular disease. Alert and oriented to self.

## 2024-07-29 ENCOUNTER — Inpatient Hospital Stay: Admit: 2024-07-29

## 2024-07-29 ENCOUNTER — Inpatient Hospital Stay (HOSPITAL_COMMUNITY)
Admit: 2024-07-29 | Discharge: 2024-07-29 | Disposition: A | Discharge status: Home / Self Care | Attending: Family Medicine | Admitting: Family Medicine

## 2024-07-29 ENCOUNTER — Emergency Department

## 2024-07-29 DIAGNOSIS — Z79899 Other long term (current) drug therapy: Secondary | ICD-10-CM | POA: Diagnosis not present

## 2024-07-29 DIAGNOSIS — J9601 Acute respiratory failure with hypoxia: Secondary | ICD-10-CM

## 2024-07-29 DIAGNOSIS — Z9012 Acquired absence of left breast and nipple: Secondary | ICD-10-CM | POA: Diagnosis not present

## 2024-07-29 DIAGNOSIS — D631 Anemia in chronic kidney disease: Secondary | ICD-10-CM | POA: Diagnosis present

## 2024-07-29 DIAGNOSIS — Z23 Encounter for immunization: Secondary | ICD-10-CM | POA: Diagnosis present

## 2024-07-29 DIAGNOSIS — J810 Acute pulmonary edema: Principal | ICD-10-CM

## 2024-07-29 DIAGNOSIS — E785 Hyperlipidemia, unspecified: Secondary | ICD-10-CM | POA: Diagnosis present

## 2024-07-29 DIAGNOSIS — R296 Repeated falls: Principal | ICD-10-CM

## 2024-07-29 DIAGNOSIS — S0990XA Unspecified injury of head, initial encounter: Secondary | ICD-10-CM

## 2024-07-29 DIAGNOSIS — I4891 Unspecified atrial fibrillation: Secondary | ICD-10-CM | POA: Diagnosis present

## 2024-07-29 DIAGNOSIS — Z66 Do not resuscitate: Secondary | ICD-10-CM | POA: Diagnosis present

## 2024-07-29 DIAGNOSIS — I350 Nonrheumatic aortic (valve) stenosis: Secondary | ICD-10-CM | POA: Diagnosis present

## 2024-07-29 DIAGNOSIS — S0181XA Laceration without foreign body of other part of head, initial encounter: Secondary | ICD-10-CM | POA: Diagnosis present

## 2024-07-29 DIAGNOSIS — I5031 Acute diastolic (congestive) heart failure: Secondary | ICD-10-CM

## 2024-07-29 DIAGNOSIS — Z8249 Family history of ischemic heart disease and other diseases of the circulatory system: Secondary | ICD-10-CM | POA: Diagnosis not present

## 2024-07-29 DIAGNOSIS — F0390 Unspecified dementia without behavioral disturbance: Secondary | ICD-10-CM | POA: Diagnosis present

## 2024-07-29 DIAGNOSIS — S62515A Nondisplaced fracture of proximal phalanx of left thumb, initial encounter for closed fracture: Secondary | ICD-10-CM

## 2024-07-29 DIAGNOSIS — I252 Old myocardial infarction: Secondary | ICD-10-CM | POA: Diagnosis not present

## 2024-07-29 DIAGNOSIS — I4892 Unspecified atrial flutter: Secondary | ICD-10-CM | POA: Diagnosis present

## 2024-07-29 DIAGNOSIS — N1832 Chronic kidney disease, stage 3b: Secondary | ICD-10-CM

## 2024-07-29 DIAGNOSIS — Z7982 Long term (current) use of aspirin: Secondary | ICD-10-CM | POA: Diagnosis not present

## 2024-07-29 DIAGNOSIS — S62512A Displaced fracture of proximal phalanx of left thumb, initial encounter for closed fracture: Secondary | ICD-10-CM

## 2024-07-29 DIAGNOSIS — I4811 Longstanding persistent atrial fibrillation: Secondary | ICD-10-CM

## 2024-07-29 DIAGNOSIS — I10 Essential (primary) hypertension: Secondary | ICD-10-CM

## 2024-07-29 DIAGNOSIS — I129 Hypertensive chronic kidney disease with stage 1 through stage 4 chronic kidney disease, or unspecified chronic kidney disease: Secondary | ICD-10-CM | POA: Diagnosis present

## 2024-07-29 DIAGNOSIS — D539 Nutritional anemia, unspecified: Secondary | ICD-10-CM | POA: Diagnosis present

## 2024-07-29 DIAGNOSIS — Z96641 Presence of right artificial hip joint: Secondary | ICD-10-CM | POA: Diagnosis present

## 2024-07-29 DIAGNOSIS — H9190 Unspecified hearing loss, unspecified ear: Secondary | ICD-10-CM | POA: Diagnosis present

## 2024-07-29 DIAGNOSIS — Z7901 Long term (current) use of anticoagulants: Secondary | ICD-10-CM | POA: Diagnosis not present

## 2024-07-29 DIAGNOSIS — W1830XA Fall on same level, unspecified, initial encounter: Secondary | ICD-10-CM | POA: Diagnosis present

## 2024-07-29 DIAGNOSIS — I2510 Atherosclerotic heart disease of native coronary artery without angina pectoris: Secondary | ICD-10-CM | POA: Diagnosis present

## 2024-07-29 DIAGNOSIS — Y92099 Unspecified place in other non-institutional residence as the place of occurrence of the external cause: Secondary | ICD-10-CM | POA: Diagnosis not present

## 2024-07-29 LAB — COMPREHENSIVE METABOLIC PANEL WITH GFR
ALT: 16 U/L (ref 0–44)
AST: 22 U/L (ref 15–41)
Albumin: 3.6 g/dL (ref 3.5–5.0)
Alkaline Phosphatase: 83 U/L (ref 38–126)
Anion gap: 14 (ref 5–15)
BUN: 38 mg/dL — ABNORMAL HIGH (ref 8–23)
CO2: 19 mmol/L — ABNORMAL LOW (ref 22–32)
Calcium: 8.9 mg/dL (ref 8.9–10.3)
Chloride: 104 mmol/L (ref 98–111)
Creatinine, Ser: 1.58 mg/dL — ABNORMAL HIGH (ref 0.44–1.00)
GFR, Estimated: 30 mL/min — ABNORMAL LOW (ref >60)
Glucose, Bld: 134 mg/dL — ABNORMAL HIGH (ref 70–99)
Potassium: 4.5 mmol/L (ref 3.5–5.1)
Sodium: 137 mmol/L (ref 135–145)
Total Bilirubin: 0.7 mg/dL (ref 0.0–1.2)
Total Protein: 8 g/dL (ref 6.5–8.1)

## 2024-07-29 LAB — PROTIME-INR
INR: 3.3 — ABNORMAL HIGH (ref 0.8–1.2)
Prothrombin Time: 35.4 s — ABNORMAL HIGH (ref 11.4–15.2)

## 2024-07-29 LAB — CBC
HCT: 33.6 % — ABNORMAL LOW (ref 36.0–46.0)
Hemoglobin: 10.5 g/dL — ABNORMAL LOW (ref 12.0–15.0)
MCH: 32.9 pg (ref 26.0–34.0)
MCHC: 31.3 g/dL (ref 30.0–36.0)
MCV: 105.3 fL — ABNORMAL HIGH (ref 80.0–100.0)
Platelets: 230 K/uL (ref 150–400)
RBC: 3.19 MIL/uL — ABNORMAL LOW (ref 3.87–5.11)
RDW: 13.2 % (ref 11.5–15.5)
WBC: 9.8 K/uL (ref 4.0–10.5)
nRBC: 0 % (ref 0.0–0.2)

## 2024-07-29 LAB — CBC WITH DIFFERENTIAL/PLATELET
Abs Immature Granulocytes: 0.02 K/uL (ref 0.00–0.07)
Basophils Absolute: 0.1 K/uL (ref 0.0–0.1)
Basophils Relative: 1 %
Eosinophils Absolute: 0.5 K/uL (ref 0.0–0.5)
Eosinophils Relative: 7 %
HCT: 35.8 % — ABNORMAL LOW (ref 36.0–46.0)
Hemoglobin: 11.6 g/dL — ABNORMAL LOW (ref 12.0–15.0)
Immature Granulocytes: 0 %
Lymphocytes Relative: 20 %
Lymphs Abs: 1.5 K/uL (ref 0.7–4.0)
MCH: 33.3 pg (ref 26.0–34.0)
MCHC: 32.4 g/dL (ref 30.0–36.0)
MCV: 102.9 fL — ABNORMAL HIGH (ref 80.0–100.0)
Monocytes Absolute: 0.8 K/uL (ref 0.1–1.0)
Monocytes Relative: 11 %
Neutro Abs: 4.6 K/uL (ref 1.7–7.7)
Neutrophils Relative %: 61 %
Platelets: 219 K/uL (ref 150–400)
RBC: 3.48 MIL/uL — ABNORMAL LOW (ref 3.87–5.11)
RDW: 13.2 % (ref 11.5–15.5)
WBC: 7.3 K/uL (ref 4.0–10.5)
nRBC: 0 % (ref 0.0–0.2)

## 2024-07-29 LAB — RETICULOCYTES
Immature Retic Fract: 30.8 % — ABNORMAL HIGH (ref 2.3–15.9)
RBC.: 3.17 MIL/uL — ABNORMAL LOW (ref 3.87–5.11)
Retic Count, Absolute: 85.6 K/uL (ref 19.0–186.0)
Retic Ct Pct: 2.7 % (ref 0.4–3.1)

## 2024-07-29 LAB — URINALYSIS, W/ REFLEX TO CULTURE (INFECTION SUSPECTED)
Bilirubin Urine: NEGATIVE
Glucose, UA: NEGATIVE mg/dL
Hgb urine dipstick: NEGATIVE
Ketones, ur: NEGATIVE mg/dL
Nitrite: NEGATIVE
Protein, ur: 30 mg/dL — AB
Specific Gravity, Urine: 1.021 (ref 1.005–1.030)
pH: 5 (ref 5.0–8.0)

## 2024-07-29 LAB — IRON AND TIBC
Iron: 19 ug/dL — ABNORMAL LOW (ref 28–170)
Saturation Ratios: 7 % — ABNORMAL LOW (ref 10.4–31.8)
TIBC: 283 ug/dL (ref 250–450)
UIBC: 264 ug/dL

## 2024-07-29 LAB — BASIC METABOLIC PANEL WITH GFR
Anion gap: 10 (ref 5–15)
BUN: 36 mg/dL — ABNORMAL HIGH (ref 8–23)
CO2: 22 mmol/L (ref 22–32)
Calcium: 8.2 mg/dL — ABNORMAL LOW (ref 8.9–10.3)
Chloride: 106 mmol/L (ref 98–111)
Creatinine, Ser: 1.39 mg/dL — ABNORMAL HIGH (ref 0.44–1.00)
GFR, Estimated: 35 mL/min — ABNORMAL LOW (ref >60)
Glucose, Bld: 107 mg/dL — ABNORMAL HIGH (ref 70–99)
Potassium: 4.9 mmol/L (ref 3.5–5.1)
Sodium: 138 mmol/L (ref 135–145)

## 2024-07-29 LAB — BRAIN NATRIURETIC PEPTIDE: B Natriuretic Peptide: 1078.4 pg/mL — ABNORMAL HIGH (ref 0.0–100.0)

## 2024-07-29 LAB — FERRITIN: Ferritin: 103 ng/mL (ref 11–307)

## 2024-07-29 LAB — RESP PANEL BY RT-PCR (RSV, FLU A&B, COVID)  RVPGX2
Influenza A by PCR: NEGATIVE
Influenza B by PCR: NEGATIVE
Resp Syncytial Virus by PCR: NEGATIVE
SARS Coronavirus 2 by RT PCR: NEGATIVE

## 2024-07-29 LAB — TROPONIN I (HIGH SENSITIVITY)
Troponin I (High Sensitivity): 14 ng/L (ref <18)
Troponin I (High Sensitivity): 13 ng/L (ref <18)

## 2024-07-29 LAB — FOLATE: Folate: 32 ng/mL (ref >5.9)

## 2024-07-29 LAB — TSH: TSH: 1.928 u[IU]/mL (ref 0.350–4.500)

## 2024-07-29 LAB — PROCALCITONIN: Procalcitonin: 0.23 ng/mL

## 2024-07-29 MED ORDER — RISAQUAD PO CAPS
1.0000 | ORAL_CAPSULE | Freq: Every day | ORAL | Status: DC
  Administered 2024-07-29 – 2024-07-30 (×2): 1 via ORAL
  Filled 2024-07-29 (×2): qty 1

## 2024-07-29 MED ORDER — SODIUM CHLORIDE 0.9% FLUSH
3.0000 mL | Freq: Two times a day (BID) | INTRAVENOUS | Status: DC
  Administered 2024-07-29 – 2024-07-30 (×3): 3 mL via INTRAVENOUS

## 2024-07-29 MED ORDER — SERTRALINE HCL 50 MG PO TABS
50.0000 mg | ORAL_TABLET | Freq: Every day | ORAL | Status: DC
  Administered 2024-07-29 – 2024-07-30 (×2): 50 mg via ORAL
  Filled 2024-07-29 (×2): qty 1

## 2024-07-29 MED ORDER — CYANOCOBALAMIN 500 MCG PO TABS: 1000.0000 ug | ORAL_TABLET | Freq: Every day | ORAL | Status: DC

## 2024-07-29 MED ORDER — METOPROLOL TARTRATE 25 MG PO TABS
25.0000 mg | ORAL_TABLET | Freq: Two times a day (BID) | ORAL | Status: DC
  Administered 2024-07-29 – 2024-07-30 (×3): 25 mg via ORAL
  Filled 2024-07-29 (×3): qty 1

## 2024-07-29 MED ORDER — PANTOPRAZOLE SODIUM 20 MG PO TBEC
20.0000 mg | DELAYED_RELEASE_TABLET | Freq: Every day | ORAL | Status: DC
  Administered 2024-07-29 – 2024-07-30 (×2): 20 mg via ORAL
  Filled 2024-07-29 (×2): qty 1

## 2024-07-29 MED ORDER — FUROSEMIDE 10 MG/ML IJ SOLN
40.0000 mg | Freq: Two times a day (BID) | INTRAMUSCULAR | Status: DC
  Administered 2024-07-29 (×2): 40 mg via INTRAVENOUS
  Filled 2024-07-29 (×2): qty 4

## 2024-07-29 MED ORDER — ONDANSETRON HCL 4 MG/2ML IJ SOLN: 4.0000 mg | Freq: Four times a day (QID) | INTRAMUSCULAR | Status: DC | PRN

## 2024-07-29 MED ORDER — ACETAMINOPHEN 650 MG RE SUPP: 650.0000 mg | Freq: Four times a day (QID) | RECTAL | Status: DC | PRN

## 2024-07-29 MED ORDER — ISOSORBIDE MONONITRATE ER 60 MG PO TB24: 30.0000 mg | ORAL_TABLET | Freq: Every day | ORAL | Status: DC

## 2024-07-29 MED ORDER — MAGNESIUM HYDROXIDE 400 MG/5ML PO SUSP
30.0000 mL | Freq: Every day | ORAL | Status: DC | PRN
Start: 2024-07-29 — End: 2024-07-30

## 2024-07-29 MED ORDER — MIRTAZAPINE 15 MG PO TABS
15.0000 mg | ORAL_TABLET | Freq: Every day | ORAL | Status: DC
  Administered 2024-07-29: 15 mg via ORAL
  Filled 2024-07-29: qty 1

## 2024-07-29 MED ORDER — ALPRAZOLAM 0.25 MG PO TABS
0.2500 mg | ORAL_TABLET | Freq: Every day | ORAL | Status: DC
  Administered 2024-07-29: 0.25 mg via ORAL
  Filled 2024-07-29: qty 1

## 2024-07-29 MED ORDER — PROSIGHT PO TABS
1.0000 | ORAL_TABLET | Freq: Two times a day (BID) | ORAL | Status: DC
  Administered 2024-07-29 – 2024-07-30 (×3): 1 via ORAL
  Filled 2024-07-29 (×3): qty 1

## 2024-07-29 MED ORDER — SODIUM CHLORIDE 0.9% FLUSH: 3.0000 mL | INTRAVENOUS | Status: DC | PRN

## 2024-07-29 MED ORDER — TRAZODONE HCL 50 MG PO TABS
25.0000 mg | ORAL_TABLET | Freq: Every evening | ORAL | Status: DC | PRN
  Administered 2024-07-29: 25 mg via ORAL
  Filled 2024-07-29: qty 1

## 2024-07-29 MED ORDER — ASPIRIN 81 MG PO TBEC: 81.0000 mg | DELAYED_RELEASE_TABLET | Freq: Every day | ORAL | Status: DC

## 2024-07-29 MED ORDER — MIDODRINE HCL 5 MG PO TABS
10.0000 mg | ORAL_TABLET | Freq: Once | ORAL | Status: AC
  Administered 2024-07-29: 10 mg via ORAL
  Filled 2024-07-29: qty 2

## 2024-07-29 MED ORDER — OYSTER SHELL CALCIUM/D3 500-5 MG-MCG PO TABS: 1.0000 | ORAL_TABLET | Freq: Every day | ORAL | Status: DC

## 2024-07-29 MED ORDER — ONDANSETRON HCL 4 MG PO TABS: 4.0000 mg | ORAL_TABLET | Freq: Four times a day (QID) | ORAL | Status: DC | PRN

## 2024-07-29 MED ORDER — WARFARIN - PHARMACIST DOSING INPATIENT
Freq: Every day | Status: DC
  Filled 2024-07-29: qty 1

## 2024-07-29 MED ORDER — NITROGLYCERIN 0.4 MG SL SUBL: 0.4000 mg | SUBLINGUAL_TABLET | SUBLINGUAL | Status: DC | PRN

## 2024-07-29 MED ORDER — FUROSEMIDE 10 MG/ML IJ SOLN
40.0000 mg | Freq: Once | INTRAMUSCULAR | Status: DC
  Filled 2024-07-29: qty 4

## 2024-07-29 MED ORDER — MENTHOL (TOPICAL ANALGESIC) 2.5 % EX GEL: Freq: Every day | CUTANEOUS | Status: DC | PRN

## 2024-07-29 MED ORDER — SODIUM CHLORIDE 0.9 % IV SOLN: 250.0000 mL | INTRAVENOUS | Status: AC | PRN

## 2024-07-29 MED ORDER — ACETAMINOPHEN 500 MG PO TABS
1000.0000 mg | ORAL_TABLET | Freq: Once | ORAL | Status: AC
  Administered 2024-07-29: 1000 mg via ORAL
  Filled 2024-07-29: qty 2

## 2024-07-29 MED ORDER — WARFARIN SODIUM 3 MG PO TABS: 3.0000 mg | ORAL_TABLET | Freq: Every day | ORAL | Status: DC

## 2024-07-29 MED ORDER — TETANUS-DIPHTH-ACELL PERTUSSIS 5-2.5-18.5 LF-MCG/0.5 IM SUSY
0.5000 mL | PREFILLED_SYRINGE | Freq: Once | INTRAMUSCULAR | Status: AC
  Administered 2024-07-29: 0.5 mL via INTRAMUSCULAR
  Filled 2024-07-29: qty 0.5

## 2024-07-29 MED ORDER — TRAMADOL HCL 50 MG PO TABS
50.0000 mg | ORAL_TABLET | Freq: Three times a day (TID) | ORAL | Status: DC | PRN
  Administered 2024-07-30: 50 mg via ORAL
  Filled 2024-07-29: qty 1

## 2024-07-29 MED ORDER — ACETAMINOPHEN 325 MG PO TABS: 650.0000 mg | ORAL_TABLET | Freq: Four times a day (QID) | ORAL | Status: DC | PRN

## 2024-07-29 MED ORDER — DILTIAZEM HCL ER COATED BEADS 180 MG PO CP24
300.0000 mg | ORAL_CAPSULE | Freq: Every day | ORAL | Status: DC
  Administered 2024-07-29 – 2024-07-30 (×2): 300 mg via ORAL
  Filled 2024-07-29 (×2): qty 1

## 2024-07-29 NOTE — Hospital Course (Addendum)
 Peggy [name] is a 89 y.o. female with medical history significant of atrial fibrillation/flutter on Coumadin , hypertension, CAD, hyperlipidemia and dementia are living in a memory care unit was brought to ED after having multiple recent falls.   On presentation vital stable with mild hypoxia requiring 2 L of oxygen with no baseline oxygen use, labs with hemoglobin of 10.5, MCV of 102, BUN 36, creatinine 1.39, BNP 1078.4, troponin negative, respiratory viral panel negative, TSH normal, UA with small leukocytes and rare bacteria. Chest x-ray concerning for pulmonary edema/infection. CT head and cervical spine was negative for any acute fracture or abnormality. Left hand x-ray with acute comminuted nondisplaced fracture of the proximal phalanx of the thumb. EKG-personally reviewed-rate controlled atrial flutter   Patient was given 1 dose of IV Lasix  and is being admitted for acute hypoxic respiratory failure secondary to pulmonary edema.  8/9: Hemodynamically stable and now saturating well on room air.  Creatinine improved to 1.34.  Anemia panel consistent with anemia of chronic disease and some iron deficiency, B12 and ferritin normal.  She was started on iron supplement. INR elevated at 4.2 this morning, per family patient has quite fluctuating INR needing frequent checking.  We discussed with family and switching her to Eliquis , she needs a monitoring of INR and Eliquis  to be started once INR is less than 2.  Family was also concerned about the cost, unfortunately unable to check her cost today but she was provided with a 30-day free coupon.  Her PCP can check her cost during weekdays and discussed with family if they decide to continue with Eliquis  instead of Coumadin .  Coumadin  has been discontinued for now and PCP can restart if needed. Next INR check should be on Monday.  Do not start Eliquis  until INR is less than 2  She was also started on low-dose Lasix  at 20 mg daily, need a close evaluation  to monitor volume status.:  Clinically appears euvolemic now.  Echocardiogram with normal EF, indeterminate diastolic function and moderate to severe aortic stenosis for which she can follow-up with her cardiologist.  She is being discharged back to her memory care unit to prevent further hospital derived delirium in her age group.  She appears at baseline now.  Our physical therapist recommended home health with her complaint of couple of recent falls which was ordered and should be given at her memory care unit.  Patient will continue the rest of her home medications and need to have a close follow-up with her providers for further assistance.

## 2024-07-29 NOTE — Evaluation (Signed)
 Occupational Therapy Evaluation Patient Details Name: Peggy [name] MRN: [id number] DOB: 1928-10-17 Today's Date: 07/29/2024   History of Present Illness   Peggy [name] is a 89 y.o. female with history of atrial fibrillation on Coumadin , hypertension, hyperlipidemia, dementia, extremely hard of hearing who presents to the emergency department from home place after a fall with resulting L thumb fx.     Clinical Impressions Pt was seen for OT evaluation this date. PTA, pt resides at [facility] where family reports she is typically MOD I with all ADLs and mobility. They report 2 recent falls at night.  Pt presents with mild deficits in her strength and activity tolerance affecting safe and optimal ADL completion. Pt currently requires MOD I for bed mobility and STS from EOB to youth RW. She ambulated ~6-8 ft total to the sink to perform standing grooming tasks with supervision. LB dressing to don mesh underwear with supervision. Pt with notable SOB, however improved quickly and sp02 stable in 90's. She is very close to her baseline, but will follow acutely to ensure no further decline or weakness. Do anticipate the need for follow up OT services upon acute hospital DC.      If plan is discharge home, recommend the following:   A little help with walking and/or transfers;A little help with bathing/dressing/bathroom;Direct supervision/assist for financial management;Supervision due to cognitive status;Direct supervision/assist for medications management     Functional Status Assessment   Patient has had a recent decline in their functional status and demonstrates the ability to make significant improvements in function in a reasonable and predictable amount of time.     Equipment Recommendations   None recommended by OT     Recommendations for Other Services         Precautions/Restrictions   Precautions Precautions: Fall Recall of Precautions/Restrictions:  Intact Restrictions Weight Bearing Restrictions Per Provider Order: No     Mobility Bed Mobility Overal bed mobility: Independent                  Transfers Overall transfer level: Needs assistance Equipment used: Rolling walker (2 wheels) Transfers: Sit to/from Stand Sit to Stand: Modified independent (Device/Increase time)           General transfer comment: MOD I for STS, supervision for in room mobility using youth RW      Balance Overall balance assessment: Mild deficits observed, not formally tested                                         ADL either performed or assessed with clinical judgement   ADL Overall ADL's : Needs assistance/impaired     Grooming: Oral care;Standing;Supervision/safety               Lower Body Dressing: Supervision/safety;Sitting/lateral leans;Sit to/from stand Lower Body Dressing Details (indicate cue type and reason): to don mesh underwear             Functional mobility during ADLs: Rolling walker (2 wheels);Supervision/safety       Vision         Perception         Praxis         Pertinent Vitals/Pain Pain Assessment Pain Assessment: No/denies pain     Extremity/Trunk Assessment Upper Extremity Assessment Upper Extremity Assessment: Overall WFL for tasks assessed;Right hand dominant;LUE deficits/detail LUE Deficits / Details: thumb  fx with mild difficulty with [facility] tasks   Lower Extremity Assessment Lower Extremity Assessment: Generalized weakness   Cervical / Trunk Assessment Cervical / Trunk Assessment: Kyphotic   Communication Communication Communication: No apparent difficulties Factors Affecting Communication: Hearing impaired   Cognition Arousal: Alert Behavior During Therapy: WFL for tasks assessed/performed                                 Following commands: Intact       Cueing  General Comments          Exercises Other Exercises Other  Exercises: Edu on role of OT in acute setting   Shoulder Instructions      Home Living Family/patient expects to be discharged to:: Assisted living                             Home Equipment: Rolling Walker (2 wheels)          Prior Functioning/Environment Prior Level of Function : Independent/Modified Independent;History of Falls (last six months)             Mobility Comments: RW use; 2 recent falls during the night to get to the bathroom ADLs Comments: meals provided by ALF, otherwis IND    OT Problem List: Decreased strength;Decreased activity tolerance   OT Treatment/Interventions: Self-care/ADL training;Patient/family education;Therapeutic exercise;Therapeutic activities;Energy conservation;DME and/or AE instruction;Balance training      OT Goals(Current goals can be found in the care plan section)   Acute Rehab OT Goals Patient Stated Goal: return to ALF OT Goal Formulation: With patient/family Time For Goal Achievement: 08/12/24 Potential to Achieve Goals: Good ADL Goals Pt Will Perform Lower Body Bathing: with modified independence;with set-up;sit to/from stand;sitting/lateral leans Pt Will Perform Lower Body Dressing: with modified independence;with set-up;sit to/from stand;sitting/lateral leans Pt Will Transfer to Toilet: with modified independence;ambulating;regular height toilet   OT Frequency:  Min 1X/week    Co-evaluation              AM-PAC OT 6 Clicks Daily Activity     Outcome Measure Help from another person eating meals?: None Help from another person taking care of personal grooming?: None Help from another person toileting, which includes using toliet, bedpan, or urinal?: None Help from another person bathing (including washing, rinsing, drying)?: A Little Help from another person to put on and taking off regular upper body clothing?: None Help from another person to put on and taking off regular lower body clothing?: A  Little 6 Click Score: 22   End of Session Equipment Utilized During Treatment: Rolling walker (2 wheels) Nurse Communication: Mobility status  Activity Tolerance: Patient tolerated treatment well Patient left: in bed;with call bell/phone within reach;with bed alarm set  OT Visit Diagnosis: Other abnormalities of gait and mobility (R26.89);Muscle weakness (generalized) (M62.81)                Time: 1348-1410 OT Time Calculation (min): 22 min Charges:  OT General Charges $OT Visit: 1 Visit OT Evaluation $OT Eval Moderate Complexity: 1 Mod OT Treatments $Self Care/Home Management : 8-22 mins Zimere Dunlevy, OTR/L 07/29/24, 3:37 PM  Lum Stillinger E Shima Compere 07/29/2024, 3:34 PM

## 2024-07-29 NOTE — Evaluation (Signed)
 Physical Therapy Evaluation Patient Details Name: Peggy [name] MRN: [id number] DOB: 1928-09-19 Today's Date: 07/29/2024  History of Present Illness  Peggy [name] is a 89 y.o. female with history of atrial fibrillation on Coumadin , hypertension, hyperlipidemia, dementia, extremely hard of hearing who presents to the emergency department from home place after a fall.  Clinical Impression  Pt admitted with above diagnosis. Pt currently with functional limitations due to the deficits listed below (see PT Problem List). Pt received upright in bed agreeable to PT eval with family present. Family assists with subjective as pt is severely HOH. PTA pt lives in ALF and is fully indep with gait using RW and ADL's. Facility provides meals.   To date, pt with normal BP readings supine to sitting. Grossly indep with bed mobility and mod-I for STS endorsing urgent need to urinate. Pt completes toileting independently and ambulates ~100' at supervision level with good tolerance for activity. Does have mild SOB post ambulation. Returns upright in bed with all needs in reach. Pt will benefit from skilled PT services to address acute deficits in weakness and endurance.   BP supine: 116/64, HR: 70 BPM  BP seated: 111/64. HR: 70 BPM      If plan is discharge home, recommend the following: A little help with walking and/or transfers;Assist for transportation;Assistance with cooking/housework;Help with stairs or ramp for entrance   Can travel by private vehicle        Equipment Recommendations None recommended by PT  Recommendations for Other Services       Functional Status Assessment Patient has had a recent decline in their functional status and demonstrates the ability to make significant improvements in function in a reasonable and predictable amount of time.     Precautions / Restrictions Precautions Precautions: Fall Recall of Precautions/Restrictions: Intact Restrictions Weight Bearing  Restrictions Per Provider Order: No      Mobility  Bed Mobility Overal bed mobility: Independent               Patient Response: Cooperative  Transfers Overall transfer level: Needs assistance Equipment used: Rolling walker (2 wheels) Transfers: Sit to/from Stand Sit to Stand: Modified independent (Device/Increase time)                Ambulation/Gait Ambulation/Gait assistance: Supervision Gait Distance (Feet): 100 Feet Assistive device: Rolling walker (2 wheels) Gait Pattern/deviations: Step-through pattern, Trunk flexed       General Gait Details: heavy BUE support on RW likely due to acute LE weakness. Family reports reduced endurance and slower cadence.  Stairs            Wheelchair Mobility     Tilt Bed Tilt Bed Patient Response: Cooperative  Modified Rankin (Stroke Patients Only)       Balance Overall balance assessment: Mild deficits observed, not formally tested                                           Pertinent Vitals/Pain Pain Assessment Pain Assessment: No/denies pain    Home Living Family/patient expects to be discharged to:: Assisted living                 Home Equipment: Agricultural consultant (2 wheels)      Prior Function Prior Level of Function : Independent/Modified Independent               ADLs Comments:  meals provided by ALF     Extremity/Trunk Assessment   Upper Extremity Assessment Upper Extremity Assessment: Defer to OT evaluation    Lower Extremity Assessment Lower Extremity Assessment: Generalized weakness    Cervical / Trunk Assessment Cervical / Trunk Assessment: Kyphotic  Communication   Communication Communication: No apparent difficulties Factors Affecting Communication: Hearing impaired    Cognition Arousal: Alert Behavior During Therapy: WFL for tasks assessed/performed   PT - Cognitive impairments: No apparent impairments                          Following commands: Intact       Cueing       General Comments      Exercises Other Exercises Other Exercises: Role of PT in acute setting, d/c recs   Assessment/Plan    PT Assessment Patient needs continued PT services  PT Problem List Decreased strength;Decreased mobility       PT Treatment Interventions DME instruction;Therapeutic exercise;Gait training;Balance training;Stair training;Neuromuscular re-education;Functional mobility training;Therapeutic activities;Patient/family education    PT Goals (Current goals can be found in the Care Plan section)  Acute Rehab PT Goals Patient Stated Goal: to return to ALF PT Goal Formulation: With patient/family Time For Goal Achievement: 08/12/24 Potential to Achieve Goals: Good    Frequency Min 2X/week     Co-evaluation               AM-PAC PT 6 Clicks Mobility  Outcome Measure Help needed turning from your back to your side while in a flat bed without using bedrails?: A Little Help needed moving from lying on your back to sitting on the side of a flat bed without using bedrails?: A Little Help needed moving to and from a bed to a chair (including a wheelchair)?: A Little Help needed standing up from a chair using your arms (e.g., wheelchair or bedside chair)?: A Little Help needed to walk in hospital room?: A Little Help needed climbing 3-5 steps with a railing? : A Little 6 Click Score: 18    End of Session Equipment Utilized During Treatment: Gait belt Activity Tolerance: Patient tolerated treatment well Patient left: in bed;with call bell/phone within reach;with bed alarm set;with family/visitor present Nurse Communication: Mobility status PT Visit Diagnosis: Repeated falls (R29.6);Muscle weakness (generalized) (M62.81);Difficulty in walking, not elsewhere classified (R26.2)    Time: 8684-8657 PT Time Calculation (min) (ACUTE ONLY): 27 min   Charges:   PT Evaluation $PT Eval Low Complexity: 1 Low PT  Treatments $Therapeutic Activity: 8-22 mins PT General Charges $$ ACUTE PT VISIT: 1 Visit        Dorina HERO. Fairly IV, PT, DPT Physical Therapist- [place]  [facility] 07/29/2024, 3:06 PM

## 2024-07-29 NOTE — Progress Notes (Signed)
 [facility] Liaison Note  The daughter, Channing, has requested Civil engineer, contracting to follow hospitalization and evaluate for outpatient palliative or hospice services at discharge.  Hospital Liaison Team will follow for disposition.  Please call with any questions or concerns.  Thank you, Randine Nail, BSN, [facility] Liaison [phone]

## 2024-07-29 NOTE — Progress Notes (Signed)
 Heart Failure Navigator Progress Note  Assessed for Heart & Vascular TOC clinic readiness.  Patient does not meet criteria due to current [facility] patient of Dr. Juliann Pares.   Navigator will sign off at this time.  Roxy Horseman, RN, BSN [facility] Heart Failure Navigator Secure Chat Only

## 2024-07-29 NOTE — Assessment & Plan Note (Signed)
 Hemoglobin of 10.5 with macrocytosis. - Check anemia panel -Monitor hemoglobin

## 2024-07-29 NOTE — Progress Notes (Signed)
 Echocardiogram 2D Echocardiogram has been performed.  Peggy [name] 07/29/2024, 6:00 PM

## 2024-07-29 NOTE — Progress Notes (Signed)
 PHARMACY - ANTICOAGULATION CONSULT NOTE  Pharmacy Consult for warfarin dosage adjustment  Indication: atrial fibrillation  Allergies  Allergen Reactions   Alendronate Sodium     REACTION: questionable   Amoxicillin-Pot Clavulanate    Etodolac    Ibandronate Sodium     REACTION: questionable   Iodine     Other reaction(s): Unknown   Lactose Intolerance (Gi) Other (See Comments)    GI problems   Minocycline Hcl    Risedronate Sodium    Sulfonamide Derivatives Other (See Comments)    Stomach pain   Zoledronic Acid     REACTION: dental problems    Patient Measurements: Height: 4' 8 (142.2 cm) Weight: 47.6 kg (104 lb 14.4 oz) IBW/kg (Calculated) : 36.3 HEPARIN  DW (KG): 46  Vital Signs: Temp: 97.6 F (36.4 C) (08/08 0700) Temp Source: Oral (08/08 0700) BP: 126/62 (08/08 0700) Pulse Rate: 68 (08/08 0700)  Labs: Recent Labs    07/29/24 0059 07/29/24 0323 07/29/24 0703  HGB 11.6*  --  10.5*  HCT 35.8*  --  33.6*  PLT 219  --  230  LABPROT 35.4*  --   --   INR 3.3*  --   --   CREATININE 1.58*  --  1.39*  TROPONINIHS 14 13  --     Estimated Creatinine Clearance: 15.9 mL/min (A) (by C-G formula based on SCr of 1.39 mg/dL (H)).   Medical History: Past Medical History:  Diagnosis Date   Allergy    Anginal pain (HCC)    c/o angina on May 04, 2016 releived by Nitroglycerin  X 2   Anxiety    Arthritis    Atrial fibrillation [facility])    Atrial fibrillation [facility])    Atrial fibrillation with rapid ventricular response [facility]) November 2015   Heart rate up to 140 requiring IV Cardizem    Breast cancer (HCC) 2012   Patient underwent a left mastectomy on 03-31-11. Pathology showed DCIS at both sites without an invasive component. Sentinel nodes were negative. The breast lesions were noted to be ER +40%, PR-positive, 5%.    Breast screening, unspecified    Coronary artery disease    Diverticulosis    Dysrhythmia    Essential hypertension, benign    GERD (gastroesophageal  reflux disease)    Hearing disorder 2012   hearing aids   Heart attack (HCC) 2001   Heart disease 2001   Hip pain    Hyperlipidemia    Hypertension 1996   Lump or mass in breast    Malignant neoplasm of upper-inner quadrant of female breast (HCC)    Malignant neoplasm of upper-outer quadrant of female breast (HCC)    Other benign neoplasm of connective and other soft tissue of thorax    Personal history of malignant neoplasm of breast    Reflux    acid reflux   Shortness of breath dyspnea    with exertion   Special screening for malignant neoplasms, colon    Stroke [facility]) 2009   Unspecified essential hypertension     Medications:  Scheduled:   acidophilus  1 capsule Oral Daily   ALPRAZolam   0.25 mg Oral QHS   diltiazem   300 mg Oral Daily   furosemide   40 mg Intravenous Once   furosemide   40 mg Intravenous Q12H   metoprolol  tartrate  25 mg Oral BID   mirtazapine   15 mg Oral QHS   multivitamin  1 tablet Oral BID   pantoprazole   20 mg Oral Daily   sertraline   50 mg Oral Daily   Warfarin - Pharmacist Dosing Inpatient   Does not apply q1600   Infusions:  PRN: acetaminophen  **OR** acetaminophen , magnesium  hydroxide, nitroGLYCERIN , ondansetron  **OR** ondansetron  (ZOFRAN ) IV, traMADol , traZODone   Assessment: 89 year old presenting with fall on warfarin at home for atrial fibrillation ([place] VASC score = 6). PMH Afib on warfarin, CKD 3b, HTN, CVA, and hip arthroplasty. Hematoma with dried blood was noted on patient's right temple. CT of cervical spine and head revealed no active bleeding. Renal function impaired with SCR of 1.39 mg/dL (CrCl 84.0 mL/min) slightly above baseline. CBC notable for low hemoglobin of 10.5 g/dL (decreased from 88 g/dL earlier today) and platelet count of 2130 K/uL.   INR today of 3.3 is above goal of 2 - 3. Patient takes warfarin 3 mg at home on on MWF, and 2.5 mg on all remaining days.   Goal of Therapy:  INR 2-3 Monitor platelets by anticoagulation  protocol: Yes   Plan:  Hold warfarin dose today in setting of elevated INR, fall, and drop in hemoglobin.  Monitor INR, platelet count, and hemoglobin daily.    Marten Iles [place] - PharmD Candidate  07/29/2024,8:13 AM

## 2024-07-29 NOTE — ED Notes (Signed)
 This tech and [place] EDT put pt on bedpan, pt had a BM and voided. Pts linen, chuck pad, and brief were changed. No other needs verbalized at this time, call bell within reach.

## 2024-07-29 NOTE — Assessment & Plan Note (Signed)
 Patient has a prior echo done in 2017 which shows mild aortic stenosis and normal EF, history of CAD. No prior history of heart failure per family. Elevated BNP and chest x-ray with pulmonary edema. Admitted to cardiac telemetry/progressive -Started on IV Lasix  -Echocardiogram ordered - Daily weight and BMP -Strict intake and output

## 2024-07-29 NOTE — ED Notes (Signed)
 40 mg Lasix  ordered IV. Pt experiencing lower blood pressures.

## 2024-07-29 NOTE — Assessment & Plan Note (Signed)
 Secondary to mechanical fall. - Supportive care

## 2024-07-29 NOTE — Assessment & Plan Note (Signed)
 Patient lives in a memory care unit, had 2 falls recently over the past 2 weeks. - PT/OT evaluation

## 2024-07-29 NOTE — H&P (Signed)
 History and Physical    Patient: Peggy [name] FMW:[id number] DOB: [birth year] DOA: 07/28/2024 DOS: the patient was seen and examined on 07/29/2024 PCP: [facility]  Patient coming from: Memory care unit  Chief Complaint:  Chief Complaint  Patient presents with   Fall   HPI: Peggy [name] is a 89 y.o. female with medical history significant of atrial fibrillation/4R on Coumadin , hypertension, CAD, hyperlipidemia and dementia are living in a memory care unit was brought to ED after having multiple recent falls.  Patient denies any chest pain, shortness of breath.  She is very hard of hearing.  Family is not aware of any other recent illness. On presentation vital stable with mild hypoxia requiring 2 L of oxygen with no baseline oxygen use, labs with hemoglobin of 10.5, MCV of 102, BUN 36, creatinine 1.39, BNP 1078.4, troponin negative, respiratory viral panel negative, TSH normal, UA with small leukocytes and rare bacteria. Chest x-ray concerning for pulmonary edema/infection. CT head and cervical spine was negative for any acute fracture or abnormality. Left hand x-ray with acute comminuted nondisplaced fracture of the proximal phalanx of the thumb. EKG-personally reviewed-rate controlled atrial flutter  Patient was given 1 dose of IV Lasix  and is being admitted for acute hypoxic respiratory failure secondary to pulmonary edema.   Review of Systems: As mentioned in the history of present illness. All other systems reviewed and are negative. Past Medical History:  Diagnosis Date   Allergy    Anginal pain (HCC)    c/o angina on May 04, 2016 releived by Nitroglycerin  X 2   Anxiety    Arthritis    Atrial fibrillation [facility])    Atrial fibrillation [facility])    Atrial fibrillation with rapid ventricular response [facility]) November 2015   Heart rate up to 140 requiring IV Cardizem    Breast cancer (HCC) 2012   Patient underwent a left mastectomy on 03-31-11. Pathology showed DCIS at  both sites without an invasive component. Sentinel nodes were negative. The breast lesions were noted to be ER +40%, PR-positive, 5%.    Breast screening, unspecified    Coronary artery disease    Diverticulosis    Dysrhythmia    Essential hypertension, benign    GERD (gastroesophageal reflux disease)    Hearing disorder 2012   hearing aids   Heart attack (HCC) 2001   Heart disease 2001   Hip pain    Hyperlipidemia    Hypertension 1996   Lump or mass in breast    Malignant neoplasm of upper-inner quadrant of female breast (HCC)    Malignant neoplasm of upper-outer quadrant of female breast (HCC)    Other benign neoplasm of connective and other soft tissue of thorax    Personal history of malignant neoplasm of breast    Reflux    acid reflux   Shortness of breath dyspnea    with exertion   Special screening for malignant neoplasms, colon    Stroke [facility]) 2009   Unspecified essential hypertension    Past Surgical History:  Procedure Laterality Date   ABDOMINAL HYSTERECTOMY     age 25   APPENDECTOMY     age 77   BREAST BIOPSY     left breast biopsy over 15 years ago    CHOLECYSTECTOMY     40 years ago    COLON SURGERY     Dr. Claudene, [facility]   COLONOSCOPY  2009   [facility] Dr. Luellen   EXCISION OF BACK LESION Left 05/09/2016  Procedure: EXCISION OF GLUTEAL MASS;  Surgeon: Reyes LELON Cota, MD;  Location: [facility] ORS;  Service: General;  Laterality: Left;   EYE SURGERY Bilateral    Cataract Extraction with IOL   HERNIA REPAIR     JOINT REPLACEMENT Right    TOTAL HIP REPLACEMENT   MASTECTOMY  2012   left breast   SMALL INTESTINE SURGERY  12/31/2012   Abdominal exploration, lysis of adhesions for distal small bowel obstruction. Unknown Sharps, MD   TONSILLECTOMY     as a child   TOTAL HIP ARTHROPLASTY Right 02/27/2016   Procedure: TOTAL HIP ARTHROPLASTY;  Surgeon: Lynwood SHAUNNA Hue, MD;  Location: [facility] ORS;  Service: Orthopedics;  Laterality: Right;   UPPER GI ENDOSCOPY  2009   [facility]  Dr. Luellen   Social History:  reports that she has never smoked. She has never used smokeless tobacco. She reports that she does not drink alcohol and does not use drugs.  Allergies  Allergen Reactions   Alendronate Sodium     REACTION: questionable   Amoxicillin-Pot Clavulanate    Etodolac    Ibandronate Sodium     REACTION: questionable   Iodine     Other reaction(s): Unknown   Lactose Intolerance (Gi) Other (See Comments)    GI problems   Minocycline Hcl    Risedronate Sodium    Sulfonamide Derivatives Other (See Comments)    Stomach pain   Zoledronic Acid     REACTION: dental problems    Family History  Problem Relation Age of Onset   Heart disease Mother    Stroke Father    Heart disease Father    Breast cancer Sister        x 2, in their 29's   Lung cancer Brother    Colon cancer Neg Hx    Ovarian cancer Neg Hx     Prior to Admission medications   Medication Sig Start Date End Date Taking? Authorizing Provider  acetaminophen  (TYLENOL ) 500 MG tablet Take 500 mg by mouth in the morning.   Yes [provider]  ALPRAZolam  (XANAX ) 0.25 MG tablet Take 0.25 mg by mouth at bedtime.   Yes [provider]  diltiazem  (CARDIZEM  CD) 300 MG 24 hr capsule Take 300 mg by mouth daily.   Yes [provider]  loperamide (IMODIUM A-D) 2 MG tablet Take 2 mg by mouth every 4 (four) hours as needed for diarrhea or loose stools.   Yes [provider]  metoprolol  tartrate (LOPRESSOR ) 25 MG tablet Take 25 mg by mouth 2 (two) times daily.   Yes [provider]  metoprolol  tartrate (LOPRESSOR ) 25 MG tablet Take 25 mg by mouth daily as needed (for Palpitations or chest pain). Check BP and HR prior to administering. DO NOT GIVE if HR less than 60 or SBP less than 110   Yes [provider]  mirtazapine  (REMERON ) 15 MG tablet Take 15 mg by mouth at bedtime.   Yes [provider]  Multiple Vitamins-Minerals (PRESERVISION AREDS 2 PO)  Take 1 tablet by mouth 2 (two) times daily.   Yes [provider]  nitroGLYCERIN  (NITROSTAT ) 0.6 MG SL tablet Place 0.6 mg under the tongue every 5 (five) minutes as needed for chest pain.   Yes [provider]  pantoprazole  (PROTONIX ) 20 MG tablet Take 20 mg by mouth daily. 07/19/24  Yes [provider]  Probiotic Product (PROBIOTIC DAILY PO) Take 1 tablet by mouth daily.    Yes [provider]  sertraline  (ZOLOFT ) 25 MG tablet Take 25 mg by mouth daily.   Yes [provider]  warfarin (COUMADIN ) 2.5 MG tablet Take 2.5 mg by mouth every Tuesday, Thursday, Saturday, and Sunday. 07/19/24  Yes [provider]  warfarin (COUMADIN ) 3 MG tablet Take 3 mg by mouth every Monday, Wednesday, and Friday.   Yes [provider]  aspirin  EC 81 MG tablet Take 81 mg by mouth daily. Patient not taking: Reported on 07/29/2024    [provider]  Calcium  Carbonate-Vitamin D  600-400 MG-UNIT tablet Take 1 tablet by mouth daily.  Patient not taking: Reported on 07/29/2024    [provider]  isosorbide  mononitrate (IMDUR ) 30 MG 24 hr tablet Take 30 mg by mouth daily.  Patient not taking: Reported on 07/29/2024    [provider]  Menthol , Topical Analgesic, 2.5 % GEL Apply topically daily as needed (for muscle pain). Patient not taking: Reported on 07/29/2024    [provider]  RABEprazole  (ACIPHEX ) 20 MG tablet Take 1 tablet (20 mg total) by mouth 2 (two) times daily. Patient not taking: Reported on 07/29/2024 12/11/16   Sherial Bail, MD  traMADol  (ULTRAM ) 50 MG tablet Take 50-75 mg by mouth 3 (three) times daily as needed for pain. Patient not taking: Reported on 07/29/2024 12/20/20   [provider]  vitamin B-12 (CYANOCOBALAMIN ) 1000 MCG tablet Take 1,000 mcg by mouth at bedtime.  Patient not taking: Reported on 07/29/2024    [provider]    Physical Exam: Vitals:   07/29/24 0700 07/29/24 1100  07/29/24 1130 07/29/24 1242  BP:  115/71 119/69 117/70  Pulse:  68 69 70  Resp:  (!) 23 (!) 27 17  Temp: 97.6 F (36.4 C)  97.7 F (36.5 C) 98.6 F (37 C)  TempSrc: Oral  Axillary   SpO2:  90% 99% 99%  Weight:      Height:       General: Vital signs reviewed.  Frail and hard of hearing elderly lady, in no acute distress and cooperative with exam.  Head: Normocephalic and atraumatic. Eyes: EOMI, conjunctivae normal, no scleral icterus.  Neck: Supple, trachea midline, normal ROM,  Cardiovascular: RRR, S1 normal, S2 normal, positive murmur Pulmonary/Chest: Clear to auscultation bilaterally, no wheezes, rales, or rhonchi. Abdominal: Soft, non-tender, non-distended, BS +,  Extremities: No lower extremity edema bilaterally,  pulses symmetric and intact bilaterally.  Neurological: A&O x3, Strength is normal and symmetric bilaterally, cranial nerve II-XII are grossly intact, no focal motor deficit, sensory intact to light touch bilaterally.  Skin: Warm, dry and intact. No rashes or erythema. Psychiatric: Normal mood and affect.   Data Reviewed: Prior data reviewed as mentioned above  Assessment and Plan: * Acute pulmonary edema (HCC) Patient has a prior echo done in 2017 which shows mild aortic stenosis and normal EF, history of CAD. No prior history of heart failure per family. Elevated BNP and chest x-ray with pulmonary edema. Admitted to cardiac telemetry/progressive -Started on IV Lasix  -Echocardiogram ordered - Daily weight and BMP -Strict intake and output  Acute hypoxic respiratory failure (HCC) Likely secondary to pulmonary edema.  Currently saturating well on 2 L of oxygen with no prior oxygen use. - Continue supplemental oxygen-wean as tolerated  Atrial fibrillation (HCC) EKG with rate controlled atrial flutter. - Continue with Cardizem  and Coumadin   Fracture of proximal phalanx of left thumb Secondary to mechanical fall. - Supportive care  Frequent falls Patient  lives in a memory care unit, had 2 falls  recently over the past 2 weeks. - PT/OT evaluation  Essential hypertension, benign Patient had an episode of softer blood pressure and was given 1 dose of midodrine . - Continuing home carvedilol and now on IV Lasix  -Monitor blood pressure closely  CKD stage 3b, GFR 30-44 ml/min (HCC) Renal function seems to be around baseline of 1.3-1.5 -Monitor renal function -Avoid nephrotoxins  Macrocytic anemia Hemoglobin of 10.5 with macrocytosis. - Check anemia panel -Monitor hemoglobin    Advance Care Planning: CODE STATUS.  DNR, medical intervention required.  Consults: None  Family Communication: Discussed with son and DIL at bedside  Severity of Illness: The appropriate patient status for this patient is INPATIENT. Inpatient status is judged to be reasonable and necessary in order to provide the required intensity of service to ensure the patient's safety. The patient's presenting symptoms, physical exam findings, and initial radiographic and laboratory data in the context of their chronic comorbidities is felt to place them at high risk for further clinical deterioration. Furthermore, it is not anticipated that the patient will be medically stable for discharge from the hospital within 2 midnights of admission.   * I certify that at the point of admission it is my clinical judgment that the patient will require inpatient hospital care spanning beyond 2 midnights from the point of admission due to high intensity of service, high risk for further deterioration and high frequency of surveillance required.*  This record has been created using Conservation officer, historic buildings. Errors have been sought and corrected,but may not always be located. Such creation errors do not reflect on the standard of care.   Author: Amaryllis Dare, MD 07/29/2024 1:59 PM  For on call review [email or web address].

## 2024-07-29 NOTE — Assessment & Plan Note (Signed)
 Patient had an episode of softer blood pressure and was given 1 dose of midodrine . - Continuing home carvedilol and now on IV Lasix  -Monitor blood pressure closely

## 2024-07-29 NOTE — ED Notes (Signed)
 Kristen Ward, DO at bedside sealing laceration to right side of forehead with dermabond at this time. This nurse cleaned blood from around laceration on side of face. Pt tolerated well. Pt's daughter remains present at bedside.

## 2024-07-29 NOTE — Assessment & Plan Note (Signed)
 Renal function seems to be around baseline of 1.3-1.5 -Monitor renal function -Avoid nephrotoxins

## 2024-07-29 NOTE — Assessment & Plan Note (Signed)
 Likely secondary to pulmonary edema.  Currently saturating well on 2 L of oxygen with no prior oxygen use. - Continue supplemental oxygen-wean as tolerated

## 2024-07-29 NOTE — ED Provider Notes (Signed)
 [facility] Provider Note    Event Date/Time   First MD Initiated Contact with Patient 07/29/24 0013     (approximate)   History   Fall   HPI  Ayame A Yaw is a 89 y.o. female with history of atrial fibrillation on Coumadin , hypertension, hyperlipidemia, dementia, extremely hard of hearing who presents to the emergency department from home place after a fall.  Her daughter reports she has had multiple unwitnessed falls recently which is abnormal for her.  Found to have oxygen saturation in the low 90s and tachypneic.  She denies chest pain, shortness of breath but is extremely hard of hearing.  Daughter is not aware of any fevers, cough.  Has laceration to right temple.  Unsure of last tetanus vaccine.  Daughter confirms that patient is a DNR/DNI.   History provided by patient's daughter, level 5 caveat secondary to patient's dementia.    Past Medical History:  Diagnosis Date   Allergy    Anginal pain (HCC)    c/o angina on May 04, 2016 releived by Nitroglycerin  X 2   Anxiety    Arthritis    Atrial fibrillation [facility])    Atrial fibrillation [facility])    Atrial fibrillation with rapid ventricular response [facility]) November 2015   Heart rate up to 140 requiring IV Cardizem    Breast cancer (HCC) 2012   Patient underwent a left mastectomy on 03-31-11. Pathology showed DCIS at both sites without an invasive component. Sentinel nodes were negative. The breast lesions were noted to be ER +40%, PR-positive, 5%.    Breast screening, unspecified    Coronary artery disease    Diverticulosis    Dysrhythmia    Essential hypertension, benign    GERD (gastroesophageal reflux disease)    Hearing disorder 2012   hearing aids   Heart attack (HCC) 2001   Heart disease 2001   Hip pain    Hyperlipidemia    Hypertension 1996   Lump or mass in breast    Malignant neoplasm of upper-inner quadrant of female breast (HCC)    Malignant neoplasm of upper-outer quadrant of  female breast (HCC)    Other benign neoplasm of connective and other soft tissue of thorax    Personal history of malignant neoplasm of breast    Reflux    acid reflux   Shortness of breath dyspnea    with exertion   Special screening for malignant neoplasms, colon    Stroke [facility]) 2009   Unspecified essential hypertension     Past Surgical History:  Procedure Laterality Date   ABDOMINAL HYSTERECTOMY     age 43   APPENDECTOMY     age 25   BREAST BIOPSY     left breast biopsy over 15 years ago    CHOLECYSTECTOMY     40 years ago    COLON SURGERY     Dr. Claudene, [facility]   COLONOSCOPY  2009   [facility] Dr. Luellen   EXCISION OF BACK LESION Left 05/09/2016   Procedure: EXCISION OF GLUTEAL MASS;  Surgeon: Reyes LELON Cota, MD;  Location: [facility] ORS;  Service: General;  Laterality: Left;   EYE SURGERY Bilateral    Cataract Extraction with IOL   HERNIA REPAIR     JOINT REPLACEMENT Right    TOTAL HIP REPLACEMENT   MASTECTOMY  2012   left breast   SMALL INTESTINE SURGERY  12/31/2012   Abdominal exploration, lysis of adhesions for distal small bowel obstruction. Unknown Claudene, MD  TONSILLECTOMY     as a child   TOTAL HIP ARTHROPLASTY Right 02/27/2016   Procedure: TOTAL HIP ARTHROPLASTY;  Surgeon: Lynwood SHAUNNA Hue, MD;  Location: [facility] ORS;  Service: Orthopedics;  Laterality: Right;   UPPER GI ENDOSCOPY  2009   [facility] Dr. Luellen    MEDICATIONS:  Prior to Admission medications   Medication Sig Start Date End Date Taking? Authorizing Provider  acetaminophen  (TYLENOL ) 325 MG tablet Take 325-650 mg by mouth every 6 (six) hours as needed for mild pain or moderate pain.    [provider]  ALPRAZolam  (XANAX ) 0.25 MG tablet Take 0.25 mg by mouth daily.    [provider]  aspirin  EC 81 MG tablet Take 81 mg by mouth daily.    [provider]  Calcium  Carbonate-Vitamin D  600-400 MG-UNIT tablet Take 1 tablet by mouth daily.     [provider]  diltiazem  (CARDIZEM  CD)  300 MG 24 hr capsule Take 300 mg by mouth daily.    [provider]  isosorbide  mononitrate (IMDUR ) 30 MG 24 hr tablet Take 30 mg by mouth daily.     [provider]  Menthol , Topical Analgesic, 2.5 % GEL Apply topically daily as needed (for muscle pain).    [provider]  metoprolol  tartrate (LOPRESSOR ) 25 MG tablet Take 25 mg by mouth 2 (two) times daily.    [provider]  mirtazapine  (REMERON ) 15 MG tablet Take 15 mg by mouth at bedtime.    [provider]  Multiple Vitamins-Minerals (PRESERVISION AREDS 2 PO) Take 1 tablet by mouth 2 (two) times daily.    [provider]  nitroGLYCERIN  (NITROSTAT ) 0.6 MG SL tablet Place 0.6 mg under the tongue every 5 (five) minutes as needed for chest pain.    [provider]  Probiotic Product (PROBIOTIC DAILY PO) Take 1 tablet by mouth daily.     [provider]  RABEprazole  (ACIPHEX ) 20 MG tablet Take 1 tablet (20 mg total) by mouth 2 (two) times daily. 12/11/16   Sherial Bail, MD  sertraline  (ZOLOFT ) 50 MG tablet Take 50 mg by mouth daily.    [provider]  traMADol  (ULTRAM ) 50 MG tablet Take 50-75 mg by mouth 3 (three) times daily as needed for pain. 12/20/20   [provider]  vitamin B-12 (CYANOCOBALAMIN ) 1000 MCG tablet Take 1,000 mcg by mouth at bedtime.     [provider]  warfarin (COUMADIN ) 3 MG tablet Take 3 mg by mouth at bedtime.    [provider]    Physical Exam   Triage Vital Signs: ED Triage Vitals [07/28/24 2355]  Encounter Vitals Group     BP (!) 135/56     Girls Systolic BP Percentile      Girls Diastolic BP Percentile      Boys Systolic BP Percentile      Boys Diastolic BP Percentile      Pulse Rate 69     Resp (!) 22     Temp      Temp src      SpO2 100 %     Weight      Height      Head Circumference      Peak Flow      Pain Score 0     Pain Loc      Pain Education      Exclude from Growth Chart      Most recent vital signs: Vitals:   07/29/24  0700 07/29/24 0700  BP: 126/62   Pulse: 68   Resp: (!) 29   Temp:  97.6 F (36.4 C)  SpO2: 99%      CONSTITUTIONAL: Alert, elderly, oriented to person only, in no distress HEAD: Normocephalic; 1 cm superficial laceration to the right temple with associated soft tissue swelling and bruising EYES: Conjunctivae clear, PERRL, EOMI ENT: normal nose; no rhinorrhea; moist mucous membranes; pharynx without lesions noted; no dental injury; no septal hematoma, no epistaxis; no facial deformity or bony tenderness NECK: Supple, no midline spinal tenderness, step-off or deformity; trachea midline CARD: Irregular and rate controlled, S1 and S2 appreciated; no murmurs, no clicks, no rubs, no gallops RESP: Intermittently tachypneic, breath sounds clear and equal bilaterally; no wheezes, no rhonchi, no rales; hypoxic to the upper 80s on room air at rest CHEST:  chest wall stable, no crepitus or ecchymosis or deformity, nontender to palpation; no flail chest ABD/GI: Non-distended; soft, non-tender, no rebound, no guarding; no ecchymosis or other lesions noted PELVIS:  stable, nontender to palpation BACK:  The back appears normal; no midline spinal tenderness, step-off or deformity EXT: Patient has changes noted to both hands consistent with chronic rheumatoid arthritis.  She is tender over the left thumb with bruising and a small abrasion.  2+ left radial pulse and normal capillary refill.  Normal ROM in all joints; no edema; normal capillary refill; no cyanosis, no bony tenderness or bony deformity of patient's extremities, no joint effusions, compartments are soft, extremities are warm and well-perfused, no ecchymosis, no calf tenderness or calf swelling SKIN: Normal color for age and race; warm NEURO: No facial asymmetry, normal speech, moving all extremities equally  ED Results / Procedures / Treatments   LABS: (all labs ordered are listed, but only  abnormal results are displayed) Labs Reviewed  CBC WITH DIFFERENTIAL/PLATELET - Abnormal; Notable for the following components:      Result Value   RBC 3.48 (*)    Hemoglobin 11.6 (*)    HCT 35.8 (*)    MCV 102.9 (*)    All other components within normal limits  COMPREHENSIVE METABOLIC PANEL WITH GFR - Abnormal; Notable for the following components:   CO2 19 (*)    Glucose, Bld 134 (*)    BUN 38 (*)    Creatinine, Ser 1.58 (*)    GFR, Estimated 30 (*)    All other components within normal limits  PROTIME-INR - Abnormal; Notable for the following components:   Prothrombin Time 35.4 (*)    INR 3.3 (*)    All other components within normal limits  BRAIN NATRIURETIC PEPTIDE - Abnormal; Notable for the following components:   B Natriuretic Peptide 1,078.4 (*)    All other components within normal limits  BASIC METABOLIC PANEL WITH GFR - Abnormal; Notable for the following components:   Glucose, Bld 107 (*)    BUN 36 (*)    Creatinine, Ser 1.39 (*)    Calcium  8.2 (*)    GFR, Estimated 35 (*)    All other components within normal limits  CBC - Abnormal; Notable for the following components:   RBC 3.19 (*)    Hemoglobin 10.5 (*)    HCT 33.6 (*)    MCV 105.3 (*)    All other components within normal limits  RESP PANEL BY RT-PCR (RSV, FLU A&B, COVID)  [id number]  PROCALCITONIN  URINALYSIS, W/ REFLEX TO CULTURE (INFECTION SUSPECTED)  TROPONIN I (HIGH SENSITIVITY)  TROPONIN I (HIGH SENSITIVITY)  EKG:  EKG Interpretation Date/Time:  Friday July 29 2024 96:[id number] EDT Ventricular Rate:  68 PR Interval:    QRS Duration:  125 QT Interval:  439 QTC Calculation: 467 R Axis:   -58  Text Interpretation: Reconfirmed by Neomi Neptune [phone]) on 07/29/2024 7:48:49 AM          RADIOLOGY: My personal review and interpretation of imaging: CT of the head and cervical spine show no acute traumatic injury other than soft tissue swelling.  I have personally reviewed all radiology  reports. DG Chest Portable 1 View Result Date: 07/29/2024 CLINICAL DATA:  Fall, borderline low oxygen saturations EXAM: PORTABLE CHEST 1 VIEW COMPARISON:  05/30/2023 FINDINGS: Stable cardiomediastinal silhouette. Low lung volumes. Diffuse bilateral interstitial coarsening similar to prior. Differential considerations include edema or infection. No pleural effusion or pneumothorax. No displaced rib fracture. IMPRESSION: Diffuse bilateral interstitial coarsening similar to prior. Differential considerations include edema or infection. Electronically Signed   By: Norman Gatlin M.D.   On: 07/29/2024 01:26   DG Hand Complete Left Result Date: 07/29/2024 CLINICAL DATA:  Unwitnessed fall EXAM: LEFT HAND - COMPLETE 3+ VIEW COMPARISON:  None Available. FINDINGS: Demineralization. Acute comminuted nondisplaced fracture of the proximal phalanx of the thumb. Advanced degenerative arthritis at the first [facility] joint with radial subluxation of the first metacarpal. Advanced osteo arthritis in the IP joints with ulnar subluxation of the second and third middle phalanges. Radial subluxation of the third distal phalanx. IMPRESSION: 1. Acute comminuted nondisplaced fracture of the proximal phalanx of the thumb. 2. 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 Electronically Signed   By: Norman Gatlin M.D.   On: 07/29/2024 01:24   CT Head Wo Contrast Result Date: 07/29/2024 CLINICAL DATA:  Head trauma, minor (Age >= 65y); Neck trauma (Age >= 65y) EXAM: CT HEAD WITHOUT CONTRAST CT CERVICAL SPINE WITHOUT CONTRAST TECHNIQUE: Multidetector CT imaging of the head and cervical spine was performed following the standard protocol without intravenous contrast. Multiplanar CT image reconstructions of the cervical spine were also generated. RADIATION DOSE REDUCTION: This exam was performed according to the departmental dose-optimization program which includes automated exposure control, adjustment of the mA and/or kV according to  patient size and/or use of iterative reconstruction technique. COMPARISON:  None Available. FINDINGS: CT HEAD FINDINGS Brain: Cerebral ventricle sizes are concordant with the degree of cerebral volume loss. Patchy and confluent areas of decreased attenuation are noted throughout the deep and periventricular white matter of the cerebral hemispheres bilaterally, compatible with chronic microvascular ischemic disease. No evidence of large-territorial acute infarction. No parenchymal hemorrhage. No mass lesion. No extra-axial collection. No mass effect or midline shift. No hydrocephalus. Basilar cisterns are patent. Vascular: No hyperdense vessel. Atherosclerotic calcifications are present within the cavernous internal carotid and vertebral arteries. Skull: No acute fracture or focal lesion. Sinuses/Orbits: Bilateral sphenoid sinus mucosal thickening. Paranasal sinuses and mastoid air cells are clear. Bilateral lens replacement. Otherwise the orbits are unremarkable. Other: None. CT CERVICAL SPINE FINDINGS Alignment: Grade 1 anterolisthesis of C3 on C4. Mild retrolisthesis of C4 on C5 grade 1 anterolisthesis of C7 on T1. Skull base and vertebrae: Multilevel severe degenerative changes of the spine. No acute fracture. No aggressive appearing focal osseous lesion or focal pathologic process. Soft tissues and spinal canal: No prevertebral fluid or swelling. No visible canal hematoma. Upper chest: Mosaic attenuation of the lungs. Interlobular septal wall thickening. Trace right pleural effusion. Other: Atherosclerotic plaque of the aortic arch and its main branches. IMPRESSION: 1. No acute  intracranial abnormality. 2. No acute displaced fracture or traumatic listhesis of the cervical spine. 3. Findings suggestive of pulmonary edema. Trace right pleural effusion. Recommend further evaluation with PA and lateral view chest x-ray Electronically Signed   By: Morgane  Naveau M.D.   On: 07/29/2024 00:39   CT Cervical Spine Wo  Contrast Result Date: 07/29/2024 CLINICAL DATA:  Head trauma, minor (Age >= 65y); Neck trauma (Age >= 65y) EXAM: CT HEAD WITHOUT CONTRAST CT CERVICAL SPINE WITHOUT CONTRAST TECHNIQUE: Multidetector CT imaging of the head and cervical spine was performed following the standard protocol without intravenous contrast. Multiplanar CT image reconstructions of the cervical spine were also generated. RADIATION DOSE REDUCTION: This exam was performed according to the departmental dose-optimization program which includes automated exposure control, adjustment of the mA and/or kV according to patient size and/or use of iterative reconstruction technique. COMPARISON:  None Available. FINDINGS: CT HEAD FINDINGS Brain: Cerebral ventricle sizes are concordant with the degree of cerebral volume loss. Patchy and confluent areas of decreased attenuation are noted throughout the deep and periventricular white matter of the cerebral hemispheres bilaterally, compatible with chronic microvascular ischemic disease. No evidence of large-territorial acute infarction. No parenchymal hemorrhage. No mass lesion. No extra-axial collection. No mass effect or midline shift. No hydrocephalus. Basilar cisterns are patent. Vascular: No hyperdense vessel. Atherosclerotic calcifications are present within the cavernous internal carotid and vertebral arteries. Skull: No acute fracture or focal lesion. Sinuses/Orbits: Bilateral sphenoid sinus mucosal thickening. Paranasal sinuses and mastoid air cells are clear. Bilateral lens replacement. Otherwise the orbits are unremarkable. Other: None. CT CERVICAL SPINE FINDINGS Alignment: Grade 1 anterolisthesis of C3 on C4. Mild retrolisthesis of C4 on C5 grade 1 anterolisthesis of C7 on T1. Skull base and vertebrae: Multilevel severe degenerative changes of the spine. No acute fracture. No aggressive appearing focal osseous lesion or focal pathologic process. Soft tissues and spinal canal: No prevertebral fluid  or swelling. No visible canal hematoma. Upper chest: Mosaic attenuation of the lungs. Interlobular septal wall thickening. Trace right pleural effusion. Other: Atherosclerotic plaque of the aortic arch and its main branches. IMPRESSION: 1. No acute intracranial abnormality. 2. No acute displaced fracture or traumatic listhesis of the cervical spine. 3. Findings suggestive of pulmonary edema. Trace right pleural effusion. Recommend further evaluation with PA and lateral view chest x-ray Electronically Signed   By: Morgane  Naveau M.D.   On: 07/29/2024 00:39     PROCEDURES:  Critical Care performed: Yes, see critical care procedure note(s)   CRITICAL CARE Performed by: Josette Berry Godsey   Total critical care time: 30 minutes  Critical care time was exclusive of separately billable procedures and treating other patients.  Critical care was necessary to treat or prevent imminent or life-threatening deterioration.  Critical care was time spent personally by me on the following activities: development of treatment plan with patient and/or surrogate as well as nursing, discussions with consultants, evaluation of patient's response to treatment, examination of patient, obtaining history from patient or surrogate, ordering and performing treatments and interventions, ordering and review of laboratory studies, ordering and review of radiographic studies, pulse oximetry and re-evaluation of patient's condition.   SABRA1-3 Lead EKG Interpretation  Performed by: Illona Bulman, Josette SAILOR, DO Authorized by: Chelsae Zanella, Josette SAILOR, DO     Interpretation: abnormal     ECG rate:  68   ECG rate assessment: normal     Rhythm: atrial flutter     Ectopy: none     Conduction: normal     SPLINT  APPLICATION Date/Time: 7:54 AM Authorized by: Josette Costella Schwarz Consent: Verbal consent obtained. Risks and benefits: risks, benefits and alternatives were discussed Consent given by: patient Splint applied by: Nurse Location details:  Left thumb Splint type: Aluminum finger splint Supplies used: Aluminum finger splint Post-procedure: The splinted body part was neurovascularly unchanged following the procedure. Patient tolerance: Patient tolerated the procedure well with no immediate complications.  LACERATION REPAIR Performed by: Josette Sink Authorized by: Josette Sink Consent: Verbal consent obtained. Risks and benefits: risks, benefits and alternatives were discussed Consent given by: patient Patient identity confirmed: provided demographic data Prepped and Draped in normal sterile fashion Wound explored  Laceration Location: Right temple  Laceration Length: 1cm  No Foreign Bodies seen or palpated  Anesthesia: None Anesthetic total: 0 ml  Irrigation method: syringe Amount of cleaning: standard  Skin closure: Superficial  Number of sutures: 0  Technique: Wound irrigated copiously with sterile saline. Wound then cleaned with Betadine Wound closed using Dermabond. Good wound approximation and hemostasis achieved.    Patient tolerance: Patient tolerated the procedure well with no immediate complications.   IMPRESSION / MDM / ASSESSMENT AND PLAN / ED COURSE  I reviewed the triage vital signs and the nursing notes.  Patient here with frequent falls, head injury, left thumb injury, tachypnea and intermittent hypoxia.  The patient is on the cardiac monitor to evaluate for evidence of arrhythmia and/or significant heart rate changes.   DIFFERENTIAL DIAGNOSIS (includes but not limited to):   Frequent falls, facial laceration and contusion, skull fracture, intracranial hemorrhage, cervical spine fracture, pneumonia, CHF, PE, ACS, anemia, electrolyte derangement, dehydration, UTI  Patient's presentation is most consistent with acute presentation with potential threat to life or bodily function.  PLAN: Will obtain labs, urine, CT head and cervical spine, x-ray of the left hand.  Will update her tetanus  vaccine.  Will clean and repair the wound to her forehead.  Will give Tylenol  for pain.   MEDICATIONS GIVEN IN ED: Medications  furosemide  (LASIX ) injection 40 mg (0 mg Intravenous Hold 07/29/24 0347)  traMADol  (ULTRAM ) tablet 50-75 mg (has no administration in time range)  diltiazem  (CARDIZEM  CD) 24 hr capsule 300 mg (has no administration in time range)  metoprolol  tartrate (LOPRESSOR ) tablet 25 mg (has no administration in time range)  nitroGLYCERIN  (NITROSTAT ) SL tablet 0.4 mg (has no administration in time range)  ALPRAZolam  (XANAX ) tablet 0.25 mg (has no administration in time range)  mirtazapine  (REMERON ) tablet 15 mg (has no administration in time range)  sertraline  (ZOLOFT ) tablet 50 mg (has no administration in time range)  acidophilus (RISAQUAD) capsule 1 capsule (has no administration in time range)  pantoprazole  (PROTONIX ) EC tablet 20 mg (has no administration in time range)  multivitamin (PROSIGHT) tablet 1 tablet (has no administration in time range)  furosemide  (LASIX ) injection 40 mg (has no administration in time range)  acetaminophen  (TYLENOL ) tablet 650 mg (has no administration in time range)    Or  acetaminophen  (TYLENOL ) suppository 650 mg (has no administration in time range)  traZODone  (DESYREL ) tablet 25 mg (has no administration in time range)  magnesium  hydroxide (MILK OF MAGNESIA) suspension 30 mL (has no administration in time range)  ondansetron  (ZOFRAN ) tablet 4 mg (has no administration in time range)    Or  ondansetron  (ZOFRAN ) injection 4 mg (has no administration in time range)  Warfarin - Pharmacist Dosing Inpatient (has no administration in time range)  Tdap (BOOSTRIX ) injection 0.5 mL (0.5 mLs Intramuscular Given 07/29/24 0117)  acetaminophen  (TYLENOL ) tablet  1,000 mg (1,000 mg Oral Given 07/29/24 0117)  midodrine  (PROAMATINE ) tablet 10 mg (10 mg Oral Given 07/29/24 0335)     ED COURSE: Labs show hemoglobin of 11.6.  Stable chronic kidney disease.   Negative troponin x 2.  CT head and cervical spine showed no acute traumatic injury other than soft tissue swelling.  Chest x-ray reviewed and interpreted by myself the radiologist and shows pulmonary edema versus infection.  BNP added on and is 1000.  Will start IV Lasix .  COVID, flu and RSV negative.  Procalcitonin 0.23.  I favor that this is volume overload.  She has no known history of CHF.  EKG shows atrial flutter with LVH.  She does have a fracture that is nondisplaced to the left thumb.  Will place her in a finger splint.  Neurovascular intact distally.  Laceration repaired without difficulty using Dermabond.  Sats in the upper 80s on room air.  Placed on 2 L nasal cannula.   Prior to giving Lasix , nursing staff reports patient had some soft blood pressures.  Will give midodrine  prior to giving Lasix .   Will discussed with the hospitalist for admission for diuresis for pulmonary edema causing acute respiratory failure with hypoxia.  Daughter comfortable with plan for admission for diuresis.  She again confirms that patient is a DNR/DNI.   CONSULTS:  Consulted and discussed patient's case with hospitalist, Dr. Lawence.  I have recommended admission and consulting physician agrees and will place admission orders.  Patient (and family if present) agree with this plan.   I reviewed all nursing notes, vitals, pertinent previous records.  All labs, EKGs, imaging ordered have been independently reviewed and interpreted by myself.    OUTSIDE RECORDS REVIEWED: Reviewed last cardiology note in December 2023 with Dr. Florencio.       FINAL CLINICAL IMPRESSION(S) / ED DIAGNOSES   Final diagnoses:  Frequent falls  Injury of head, initial encounter  Facial laceration, initial encounter  Acute pulmonary edema (HCC)  Acute respiratory failure with hypoxia (HCC)     Rx / DC Orders   ED Discharge Orders     None        Note:  This document was prepared using Dragon voice recognition  software and may include unintentional dictation errors.   Woods Gangemi, Josette SAILOR, DO 07/29/24 [id number]

## 2024-07-29 NOTE — ED Notes (Signed)
 Pt transported to CT ?

## 2024-07-29 NOTE — Assessment & Plan Note (Signed)
 EKG with rate controlled atrial flutter. - Continue with Cardizem  and Coumadin 

## 2024-07-29 NOTE — TOC CM/SW Note (Signed)
..  Transition of Care [facility]) - Inpatient Brief Assessment   Patient Details  Name: Peggy [name] MRN: [id number] Date of Birth: [birth year]  Transition of Care [facility]) CM/SW Contact:    Edsel DELENA Fischer, LCSW Phone Number: 07/29/2024, 12:27 PM   Clinical Narrative:    Transition of Care Asessment:

## 2024-07-29 NOTE — Care Management Important Message (Signed)
 Important Message  Patient Details  Name: Peggy [name] MRN: [id number] Date of Birth: Sep 16, 1928   Important Message Given:  Yes - Medicare IM     Rojelio SHAUNNA Rattler 07/29/2024, 4:09 PM

## 2024-07-29 NOTE — ED Notes (Signed)
 IV Lasix  ordered. Blood pressures lower. 0300 BP: 104/63. 0325 BP: 98/65. Discussed this with [facility], DO asking to possibly hold Lasix  for right now. Provider stated that is fine and ordered Midodrine . Prior to administering Midodrine , BP cycled to 112/56. Kristen Ward, DO notified and verified that it's still okay to administer the Midodrine  at this time and hold the Lasix  until BP improves.

## 2024-07-30 DIAGNOSIS — J810 Acute pulmonary edema: Secondary | ICD-10-CM | POA: Diagnosis not present

## 2024-07-30 DIAGNOSIS — R296 Repeated falls: Secondary | ICD-10-CM | POA: Diagnosis not present

## 2024-07-30 DIAGNOSIS — S0181XA Laceration without foreign body of other part of head, initial encounter: Secondary | ICD-10-CM | POA: Diagnosis not present

## 2024-07-30 DIAGNOSIS — J9601 Acute respiratory failure with hypoxia: Secondary | ICD-10-CM | POA: Diagnosis not present

## 2024-07-30 LAB — CBC
HCT: 32.3 % — ABNORMAL LOW (ref 36.0–46.0)
Hemoglobin: 10.7 g/dL — ABNORMAL LOW (ref 12.0–15.0)
MCH: 32.5 pg (ref 26.0–34.0)
MCHC: 33.1 g/dL (ref 30.0–36.0)
MCV: 98.2 fL (ref 80.0–100.0)
Platelets: 250 K/uL (ref 150–400)
RBC: 3.29 MIL/uL — ABNORMAL LOW (ref 3.87–5.11)
RDW: 13 % (ref 11.5–15.5)
WBC: 7.1 K/uL (ref 4.0–10.5)
nRBC: 0 % (ref 0.0–0.2)

## 2024-07-30 LAB — PROTIME-INR
INR: 4.2 (ref 0.8–1.2)
Prothrombin Time: 42.4 s — ABNORMAL HIGH (ref 11.4–15.2)

## 2024-07-30 LAB — ECHOCARDIOGRAM COMPLETE
AR max vel: 0.29 cm2
AV Area VTI: 0.25 cm2
AV Area mean vel: 0.26 cm2
AV Mean grad: 37.5 mmHg
AV Peak grad: 62.7 mmHg
Ao pk vel: 3.96 m/s
Area-P 1/2: 3.21 cm2
Height: 56 in
MV M vel: 5.6 m/s
MV Peak grad: 125.4 mmHg
MV VTI: 0.56 cm2
S' Lateral: 2.7 cm
Weight: 1678.4 [oz_av]

## 2024-07-30 LAB — BASIC METABOLIC PANEL WITH GFR
Anion gap: 12 (ref 5–15)
BUN: 34 mg/dL — ABNORMAL HIGH (ref 8–23)
CO2: 24 mmol/L (ref 22–32)
Calcium: 9 mg/dL (ref 8.9–10.3)
Chloride: 103 mmol/L (ref 98–111)
Creatinine, Ser: 1.34 mg/dL — ABNORMAL HIGH (ref 0.44–1.00)
GFR, Estimated: 37 mL/min — ABNORMAL LOW (ref >60)
Glucose, Bld: 98 mg/dL (ref 70–99)
Potassium: 3.9 mmol/L (ref 3.5–5.1)
Sodium: 139 mmol/L (ref 135–145)

## 2024-07-30 LAB — GLUCOSE, CAPILLARY: Glucose-Capillary: 101 mg/dL — ABNORMAL HIGH (ref 70–99)

## 2024-07-30 LAB — VITAMIN B12: Vitamin B-12: 1375 pg/mL — ABNORMAL HIGH (ref 180–914)

## 2024-07-30 MED ORDER — FERROUS SULFATE 325 (65 FE) MG PO TABS: 325.0000 mg | ORAL_TABLET | Freq: Every day | ORAL | 3 refills | Status: AC

## 2024-07-30 MED ORDER — FERROUS SULFATE 325 (65 FE) MG PO TABS
325.0000 mg | ORAL_TABLET | Freq: Every day | ORAL | Status: DC
  Administered 2024-07-30: 325 mg via ORAL
  Filled 2024-07-30: qty 1

## 2024-07-30 MED ORDER — FUROSEMIDE 40 MG PO TABS
40.0000 mg | ORAL_TABLET | Freq: Once | ORAL | Status: AC
  Administered 2024-07-30: 40 mg via ORAL
  Filled 2024-07-30: qty 1

## 2024-07-30 MED ORDER — FUROSEMIDE 40 MG PO TABS: 20.0000 mg | ORAL_TABLET | Freq: Every day | ORAL | 2 refills | Status: AC

## 2024-07-30 MED ORDER — APIXABAN 2.5 MG PO TABS: 2.5000 mg | ORAL_TABLET | Freq: Two times a day (BID) | ORAL | 2 refills | Status: AC

## 2024-07-30 NOTE — Progress Notes (Signed)
 PHARMACY - ANTICOAGULATION CONSULT NOTE  Pharmacy Consult for warfarin dosage adjustment  Indication: atrial fibrillation  Patient Measurements: Height: 4' 8 (142.2 cm) Weight: 42.5 kg (93 lb 11.1 oz) IBW/kg (Calculated) : 36.3 HEPARIN  DW (KG): 46  Labs: Recent Labs    07/29/24 0059 07/29/24 0323 07/29/24 0703 07/30/24 0416  HGB 11.6*  --  10.5* 10.7*  HCT 35.8*  --  33.6* 32.3*  PLT 219  --  230 250  LABPROT 35.4*  --   --  42.4*  INR 3.3*  --   --  4.2*  CREATININE 1.58*  --  1.39* 1.34*  TROPONINIHS 14 13  --   --     Estimated Creatinine Clearance: 14.7 mL/min (A) (by C-G formula based on SCr of 1.34 mg/dL (H)).   Medical History: Past Medical History:  Diagnosis Date   Allergy    Anginal pain (HCC)    c/o angina on May 04, 2016 releived by Nitroglycerin  X 2   Anxiety    Arthritis    Atrial fibrillation [facility])    Atrial fibrillation [facility])    Atrial fibrillation with rapid ventricular response [facility]) November 2015   Heart rate up to 140 requiring IV Cardizem    Breast cancer (HCC) 2012   Patient underwent a left mastectomy on 03-31-11. Pathology showed DCIS at both sites without an invasive component. Sentinel nodes were negative. The breast lesions were noted to be ER +40%, PR-positive, 5%.    Breast screening, unspecified    Coronary artery disease    Diverticulosis    Dysrhythmia    Essential hypertension, benign    GERD (gastroesophageal reflux disease)    Hearing disorder 2012   hearing aids   Heart attack (HCC) 2001   Heart disease 2001   Hip pain    Hyperlipidemia    Hypertension 1996   Lump or mass in breast    Malignant neoplasm of upper-inner quadrant of female breast (HCC)    Malignant neoplasm of upper-outer quadrant of female breast (HCC)    Other benign neoplasm of connective and other soft tissue of thorax    Personal history of malignant neoplasm of breast    Reflux    acid reflux   Shortness of breath dyspnea    with exertion   Special  screening for malignant neoplasms, colon    Stroke [facility]) 2009   Unspecified essential hypertension    Assessment: 89 year old presenting with fall on warfarin at home for atrial fibrillation ([place] VASC score = 6). PMH Afib on warfarin, CKD 3b, HTN, CVA, and hip arthroplasty. Hematoma with dried blood was noted on patient's right temple. CT of cervical spine and head revealed no active bleeding. Renal function impaired with SCR of 1.39 mg/dL (CrCl 84.0 mL/min) slightly above baseline. CBC notable for low hemoglobin of 10.5 g/dL (decreased from 88.3 g/dL earlier today) and platelet count of 2130 K/uL.   INR today of 4.2 from 3.3 yesterday is above goal of 2 - 3. Patient takes warfarin 3 mg at home on on MWF, and 2.5 mg on all remaining days.   Goal of Therapy:  INR 2-3 Monitor platelets by anticoagulation protocol: Yes   Plan:  Hold warfarin dose today in setting of elevated INR. Hgb is stable. Continue to monitor for s/sx bleeding. Daily INR per protocol  Marolyn KATHEE Mare 07/30/2024,6:54 AM

## 2024-07-30 NOTE — Progress Notes (Addendum)
 [facility] Liaison note  Dr. Caleen reported that patient is appropriate for outpatient palliative care.  Romero Crete, LCSW, stated that it was ok for [facility] to submit the referral.    Referral has been submitted for outpatient palliative care.  Please call with any outpatient palliative care questions or concerns.    Thank you for the opportunity to participate in this patient's care  [facility] [address] [phone]

## 2024-07-30 NOTE — Plan of Care (Signed)
  Problem: Education: Goal: Knowledge of General Education information will improve Description: Including pain rating scale, medication(s)/side effects and non-pharmacologic comfort measures Outcome: Progressing   Problem: Health Behavior/Discharge Planning: Goal: Ability to manage health-related needs will improve Outcome: Progressing   Problem: Clinical Measurements: Goal: Diagnostic test results will improve Outcome: Progressing Goal: Cardiovascular complication will be avoided Outcome: Progressing   Problem: Coping: Goal: Level of anxiety will decrease Outcome: Progressing   Problem: Elimination: Goal: Will not experience complications related to urinary retention Outcome: Progressing   Problem: Safety: Goal: Ability to remain free from injury will improve Outcome: Progressing

## 2024-07-30 NOTE — Discharge Summary (Signed)
 Physician Discharge Summary   Patient: Peggy [name] MRN: [id number] DOB: [birth year]  Admit date:     07/28/2024  Discharge date: 07/30/24  Discharge Physician: Amaryllis Dare   [facility]: [facility]   Recommendations at discharge:  Please obtain CBC, BMP and INR on Monday We are switching Coumadin  with Eliquis , please do not start Eliquis  unless INR is less than 2. Patient has also been started on low-dose Lasix , please monitor volume status closely and hold Lasix  if appears dehydrated. Follow-up with primary care provider Follow-up with cardiology  Discharge Diagnoses: Principal Problem:   Acute pulmonary edema (HCC) Active Problems:   Atrial fibrillation (HCC)   Acute hypoxic respiratory failure (HCC)   Fracture of proximal phalanx of left thumb   Frequent falls   Essential hypertension, benign   CKD stage 3b, GFR 30-44 ml/min (HCC)   Macrocytic anemia   Facial laceration   Head injury   Hospital Course: Peggy [name] is a 89 y.o. female with medical history significant of atrial fibrillation/flutter on Coumadin , hypertension, CAD, hyperlipidemia and dementia are living in a memory care unit was brought to ED after having multiple recent falls.   On presentation vital stable with mild hypoxia requiring 2 L of oxygen with no baseline oxygen use, labs with hemoglobin of 10.5, MCV of 102, BUN 36, creatinine 1.39, BNP 1078.4, troponin negative, respiratory viral panel negative, TSH normal, UA with small leukocytes and rare bacteria. Chest x-ray concerning for pulmonary edema/infection. CT head and cervical spine was negative for any acute fracture or abnormality. Left hand x-ray with acute comminuted nondisplaced fracture of the proximal phalanx of the thumb. EKG-personally reviewed-rate controlled atrial flutter   Patient was given 1 dose of IV Lasix  and is being admitted for acute hypoxic respiratory failure secondary to pulmonary edema.  8/9: Hemodynamically  stable and now saturating well on room air.  Creatinine improved to 1.34.  Anemia panel consistent with anemia of chronic disease and some iron deficiency, B12 and ferritin normal.  She was started on iron supplement. INR elevated at 4.2 this morning, per family patient has quite fluctuating INR needing frequent checking.  We discussed with family and switching her to Eliquis , she needs a monitoring of INR and Eliquis  to be started once INR is less than 2.  Family was also concerned about the cost, unfortunately unable to check her cost today but she was provided with a 30-day free coupon.  Her PCP can check her cost during weekdays and discussed with family if they decide to continue with Eliquis  instead of Coumadin .  Coumadin  has been discontinued for now and PCP can restart if needed. Next INR check should be on Monday.  Do not start Eliquis  until INR is less than 2  She was also started on low-dose Lasix  at 20 mg daily, need a close evaluation to monitor volume status.:  Clinically appears euvolemic now.  Echocardiogram with normal EF, indeterminate diastolic function and moderate to severe aortic stenosis for which she can follow-up with her cardiologist.  She is being discharged back to her memory care unit to prevent further hospital derived delirium in her age group.  She appears at baseline now.  Our physical therapist recommended home health with her complaint of couple of recent falls which was ordered and should be given at her memory care unit.  Patient will continue the rest of her home medications and need to have a close follow-up with her providers for further assistance.  Assessment and Plan: *  Acute pulmonary edema [facility]) Patient has a prior echo done in 2017 which shows mild aortic stenosis and normal EF, history of CAD. No prior history of heart failure per family. Elevated BNP and chest x-ray with pulmonary edema. Echocardiogram with normal EF, moderate to severe AS Received  IV Lasix  and now being switched to p.o. and will need a close follow-up with her cardiologist for further assistance  Acute hypoxic respiratory failure (HCC) Likely secondary to pulmonary edema.  Currently saturating well on 2 L of oxygen with no prior oxygen use. - Now weaned back to room air  Atrial fibrillation/flutter (HCC) EKG with rate controlled atrial flutter. - Continue with Cardizem  and Coumadin   Fracture of proximal phalanx of left thumb Secondary to mechanical fall. - Supportive care  Frequent falls Patient lives in a memory care unit, had 2 falls recently over the past 2 weeks. - PT/OT evaluation-recommending home health which was ordered  Essential hypertension, benign Patient had an episode of softer blood pressure and was given 1 dose of midodrine .  Blood pressure now mildly elevated - Continuing home carvedilol and now on IV Lasix  -Monitor blood pressure closely  CKD stage 3b, GFR 30-44 ml/min (HCC) Renal function seems to be around baseline of 1.3-1.5 -Monitor renal function -Avoid nephrotoxins  Macrocytic anemia Hemoglobin of 10.5 with macrocytosis. - Anemia panel with anemia of chronic disease and some iron deficiency-starting on iron supplement.  Folate and B12 normal -Monitor hemoglobin  Consultants: None Procedures performed: None Disposition: Skilled nursing facility Diet recommendation:  Discharge Diet Orders (From admission, onward)     Start     Ordered   07/30/24 0000  Diet - low sodium heart healthy        07/30/24 1051           Cardiac diet DISCHARGE MEDICATION: Allergies as of 07/30/2024       Reactions   Alendronate Sodium    REACTION: questionable   Amoxicillin-pot Clavulanate    Etodolac    Ibandronate Sodium    REACTION: questionable   Iodine    Other reaction(s): Unknown   Lactose Intolerance (gi) Other (See Comments)   GI problems   Minocycline Hcl    Risedronate Sodium    Sulfonamide Derivatives Other (See Comments)    Stomach pain   Zoledronic Acid    REACTION: dental problems        Medication List     STOP taking these medications    aspirin  EC 81 MG tablet   Calcium  Carbonate-Vitamin D  600-400 MG-UNIT tablet   cyanocobalamin  1000 MCG tablet Commonly known as: VITAMIN B12   isosorbide  mononitrate 30 MG 24 hr tablet Commonly known as: IMDUR    RABEprazole  20 MG tablet Commonly known as: Aciphex    traMADol  50 MG tablet Commonly known as: ULTRAM    warfarin 2.5 MG tablet Commonly known as: COUMADIN    warfarin 3 MG tablet Commonly known as: COUMADIN        TAKE these medications    acetaminophen  500 MG tablet Commonly known as: TYLENOL  Take 500 mg by mouth in the morning.   ALPRAZolam  0.25 MG tablet Commonly known as: XANAX  Take 0.25 mg by mouth at bedtime.   apixaban  2.5 MG Tabs tablet Commonly known as: ELIQUIS  Take 1 tablet (2.5 mg total) by mouth 2 (two) times daily.   diltiazem  300 MG 24 hr capsule Commonly known as: CARDIZEM  CD Take 300 mg by mouth daily.   ferrous sulfate  325 (65 FE) MG tablet Take 1 tablet (325  mg total) by mouth daily with breakfast. Start taking on: July 31, 2024   furosemide  40 MG tablet Commonly known as: LASIX  Take 0.5 tablets (20 mg total) by mouth daily.   loperamide 2 MG tablet Commonly known as: IMODIUM A-D Take 2 mg by mouth every 4 (four) hours as needed for diarrhea or loose stools.   Menthol  (Topical Analgesic) 2.5 % Gel Apply topically daily as needed (for muscle pain).   metoprolol  tartrate 25 MG tablet Commonly known as: LOPRESSOR  Take 25 mg by mouth 2 (two) times daily.   metoprolol  tartrate 25 MG tablet Commonly known as: LOPRESSOR  Take 25 mg by mouth daily as needed (for Palpitations or chest pain). Check BP and HR prior to administering. DO NOT GIVE if HR less than 60 or SBP less than 110   mirtazapine  15 MG tablet Commonly known as: REMERON  Take 15 mg by mouth at bedtime.   nitroGLYCERIN  0.6 MG SL  tablet Commonly known as: NITROSTAT  Place 0.6 mg under the tongue every 5 (five) minutes as needed for chest pain.   pantoprazole  20 MG tablet Commonly known as: PROTONIX  Take 20 mg by mouth daily.   PRESERVISION AREDS 2 PO Take 1 tablet by mouth 2 (two) times daily.   PROBIOTIC DAILY PO Take 1 tablet by mouth daily.   sertraline  25 MG tablet Commonly known as: ZOLOFT  Take 25 mg by mouth daily.        Follow-up Information     [facility] Schedule an appointment as soon as possible for a visit in 1 week(s).   Contact information: [address] [place] [address] [phone]                Discharge Exam: Fredricka Weights   07/29/24 0151 07/30/24 0415  Weight: 47.6 kg 42.5 kg   General.  Frail and malnourished elderly lady, in no acute distress. Pulmonary.  Lungs clear bilaterally, normal respiratory effort. CV.  Regular rate and rhythm, no JVD, rub or murmur. Abdomen.  Soft, nontender, nondistended, BS positive. CNS.  Alert and oriented .  No focal neurologic deficit. Extremities.  No edema, no cyanosis, pulses intact and symmetrical.   Condition at discharge: stable  The results of significant diagnostics from this hospitalization (including imaging, microbiology, ancillary and laboratory) are listed below for reference.   Imaging Studies: ECHOCARDIOGRAM COMPLETE Result Date: 07/30/2024    ECHOCARDIOGRAM REPORT   Patient Name:   DESSIREE SZE Kokesh Date of Exam: 07/29/2024 Medical Rec #:  [id number]       Height:       56.0 in Accession #:    [id number]      Weight:       104.9 lb Date of Birth:  05-22-1928       BSA:          1.349 m Patient Age:    89 years        BP:           126/64 mmHg Patient Gender: F               HR:           71 bpm. Exam Location:  [facility] Procedure: 2D Echo, Cardiac Doppler and Color Doppler (Both Spectral and Color            Flow Doppler were utilized during procedure). Indications:     CHF-Acute Diastolic I50.31   History:         Patient has  prior history of Echocardiogram examinations, most                  recent 12/09/2016. CKD, stage 3 and Stroke, Arrythmias:Atrial                  Fibrillation and Atrial Flutter, Signs/Symptoms:Chest Pain;                  Risk Factors:Hypertension.  Sonographer:     Thea Norlander RCS Referring Phys:  [id number] MADISON A MANSY Diagnosing Phys: Evalene Lunger MD  Sonographer Comments: Image acquisition challenging due to patient behavioral factors. and Image acquisition challenging due to uncooperative patient. IMPRESSIONS  1. Left ventricular ejection fraction, by estimation, is 55 to 60%. The left ventricle has normal function. The left ventricle has no regional wall motion abnormalities. Left ventricular diastolic parameters are indeterminate.  2. Right ventricular systolic function is normal. The right ventricular size is normal. There is normal pulmonary artery systolic pressure. The estimated right ventricular systolic pressure is 30.0 mmHg.  3. The mitral valve is normal in structure. Mild to moderate mitral valve regurgitation. No evidence of mitral stenosis. Moderate mitral annular calcification.  4. Tricuspid valve regurgitation is mild to moderate.  5. The aortic valve is normal in structure. Aortic valve regurgitation is mild. Moderate to severe aortic valve stenosis. Aortic valve mean gradient measures 37.5 mmHg. Aortic valve Vmax measures 3.96 m/s.  6. The inferior vena cava is normal in size with greater than 50% respiratory variability, suggesting right atrial pressure of 3 mmHg.  7. Rhythm suggestive of atrial flutter FINDINGS  Left Ventricle: Left ventricular ejection fraction, by estimation, is 55 to 60%. The left ventricle has normal function. The left ventricle has no regional wall motion abnormalities. Strain was performed and the global longitudinal strain is indeterminate. The left ventricular internal cavity size was normal in size. There is no left ventricular  hypertrophy. Left ventricular diastolic parameters are indeterminate. Right Ventricle: The right ventricular size is normal. No increase in right ventricular wall thickness. Right ventricular systolic function is normal. There is normal pulmonary artery systolic pressure. The tricuspid regurgitant velocity is 2.50 m/s, and  with an assumed right atrial pressure of 5 mmHg, the estimated right ventricular systolic pressure is 30.0 mmHg. Left Atrium: Left atrial size was normal in size. Right Atrium: Right atrial size was normal in size. Pericardium: There is no evidence of pericardial effusion. Mitral Valve: The mitral valve is normal in structure. Moderate mitral annular calcification. Mild to moderate mitral valve regurgitation. No evidence of mitral valve stenosis. MV peak gradient, 11.6 mmHg. The mean mitral valve gradient is 4.0 mmHg. Tricuspid Valve: The tricuspid valve is normal in structure. Tricuspid valve regurgitation is mild to moderate. No evidence of tricuspid stenosis. Aortic Valve: The aortic valve is normal in structure. Aortic valve regurgitation is mild. Moderate to severe aortic stenosis is present. Aortic valve mean gradient measures 37.5 mmHg. Aortic valve peak gradient measures 62.7 mmHg. Aortic valve area, by VTI measures 0.25 cm. Pulmonic Valve: The pulmonic valve was normal in structure. Pulmonic valve regurgitation is mild. No evidence of pulmonic stenosis. Aorta: The aortic root is normal in size and structure. Venous: The inferior vena cava is normal in size with greater than 50% respiratory variability, suggesting right atrial pressure of 3 mmHg. IAS/Shunts: No atrial level shunt detected by color flow Doppler. Additional Comments: 3D was performed not requiring image post processing on an independent workstation and was indeterminate.  LEFT VENTRICLE PLAX  2D LVIDd:         4.00 cm   Diastology LVIDs:         2.70 cm   LV e' medial:    4.80 cm/s LV PW:         1.10 cm   LV E/e' medial:   30.6 LV IVS:        0.80 cm   LV e' lateral:   8.62 cm/s LVOT diam:     1.50 cm   LV E/e' lateral: 17.0 LV SV:         21 LV SV Index:   16 LVOT Area:     1.77 cm  RIGHT VENTRICLE            IVC RV S prime:     6.53 cm/s  IVC diam: 1.90 cm TAPSE (M-mode): 1.0 cm LEFT ATRIUM           Index        RIGHT ATRIUM          Index LA diam:      5.10 cm 3.78 cm/m   RA Area:     7.34 cm LA Vol (A2C): 41.4 ml 30.68 ml/m  RA Volume:   8.46 ml  6.27 ml/m LA Vol (A4C): 60.2 ml 44.61 ml/m  AORTIC VALVE                     PULMONIC VALVE AV Area (Vmax):    0.29 cm      PR End Diast Vel: 4.93 msec AV Area (Vmean):   0.26 cm AV Area (VTI):     0.25 cm AV Vmax:           396.00 cm/s AV Vmean:          280.000 cm/s AV VTI:            0.838 m AV Peak Grad:      62.7 mmHg AV Mean Grad:      37.5 mmHg LVOT Vmax:         64.80 cm/s LVOT Vmean:        40.800 cm/s LVOT VTI:          0.119 m LVOT/AV VTI ratio: 0.14  AORTA Ao Root diam: 2.80 cm Ao Asc diam:  3.10 cm MITRAL VALVE                TRICUSPID VALVE MV Area (PHT): 3.21 cm     TR Peak grad:   25.0 mmHg MV Area VTI:   0.56 cm     TR Vmax:        250.00 cm/s MV Peak grad:  11.6 mmHg MV Mean grad:  4.0 mmHg     SHUNTS MV Vmax:       1.70 m/s     Systemic VTI:  0.12 m MV Vmean:      86.8 cm/s    Systemic Diam: 1.50 cm MV Decel Time: 236 msec MR Peak grad: 125.4 mmHg MR Vmax:      560.00 cm/s MV E velocity: 147.00 cm/s MV A velocity: 60.10 cm/s MV E/A ratio:  2.45 Evalene Lunger MD Electronically signed by Evalene Lunger MD Signature Date/Time: 07/30/2024/9:26:53 AM    Final    DG Chest Portable 1 View Result Date: 07/29/2024 CLINICAL DATA:  Fall, borderline low oxygen saturations EXAM: PORTABLE CHEST 1 VIEW COMPARISON:  05/30/2023 FINDINGS: Stable cardiomediastinal silhouette. Low lung volumes. Diffuse bilateral interstitial coarsening similar to  prior. Differential considerations include edema or infection. No pleural effusion or pneumothorax. No displaced rib fracture.  IMPRESSION: Diffuse bilateral interstitial coarsening similar to prior. Differential considerations include edema or infection. Electronically Signed   By: Norman Gatlin M.D.   On: 07/29/2024 01:26   DG Hand Complete Left Result Date: 07/29/2024 CLINICAL DATA:  Unwitnessed fall EXAM: LEFT HAND - COMPLETE 3+ VIEW COMPARISON:  None Available. FINDINGS: Demineralization. Acute comminuted nondisplaced fracture of the proximal phalanx of the thumb. Advanced degenerative arthritis at the first [facility] joint with radial subluxation of the first metacarpal. Advanced osteo arthritis in the IP joints with ulnar subluxation of the second and third middle phalanges. Radial subluxation of the third distal phalanx. IMPRESSION: 1. Acute comminuted nondisplaced fracture of the proximal phalanx of the thumb. 2. 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 Electronically Signed   By: Norman Gatlin M.D.   On: 07/29/2024 01:24   CT Head Wo Contrast Result Date: 07/29/2024 CLINICAL DATA:  Head trauma, minor (Age >= 65y); Neck trauma (Age >= 65y) EXAM: CT HEAD WITHOUT CONTRAST CT CERVICAL SPINE WITHOUT CONTRAST TECHNIQUE: Multidetector CT imaging of the head and cervical spine was performed following the standard protocol without intravenous contrast. Multiplanar CT image reconstructions of the cervical spine were also generated. RADIATION DOSE REDUCTION: This exam was performed according to the departmental dose-optimization program which includes automated exposure control, adjustment of the mA and/or kV according to patient size and/or use of iterative reconstruction technique. COMPARISON:  None Available. FINDINGS: CT HEAD FINDINGS Brain: Cerebral ventricle sizes are concordant with the degree of cerebral volume loss. Patchy and confluent areas of decreased attenuation are noted throughout the deep and periventricular white matter of the cerebral hemispheres bilaterally, compatible with chronic microvascular ischemic  disease. No evidence of large-territorial acute infarction. No parenchymal hemorrhage. No mass lesion. No extra-axial collection. No mass effect or midline shift. No hydrocephalus. Basilar cisterns are patent. Vascular: No hyperdense vessel. Atherosclerotic calcifications are present within the cavernous internal carotid and vertebral arteries. Skull: No acute fracture or focal lesion. Sinuses/Orbits: Bilateral sphenoid sinus mucosal thickening. Paranasal sinuses and mastoid air cells are clear. Bilateral lens replacement. Otherwise the orbits are unremarkable. Other: None. CT CERVICAL SPINE FINDINGS Alignment: Grade 1 anterolisthesis of C3 on C4. Mild retrolisthesis of C4 on C5 grade 1 anterolisthesis of C7 on T1. Skull base and vertebrae: Multilevel severe degenerative changes of the spine. No acute fracture. No aggressive appearing focal osseous lesion or focal pathologic process. Soft tissues and spinal canal: No prevertebral fluid or swelling. No visible canal hematoma. Upper chest: Mosaic attenuation of the lungs. Interlobular septal wall thickening. Trace right pleural effusion. Other: Atherosclerotic plaque of the aortic arch and its main branches. IMPRESSION: 1. No acute intracranial abnormality. 2. No acute displaced fracture or traumatic listhesis of the cervical spine. 3. Findings suggestive of pulmonary edema. Trace right pleural effusion. Recommend further evaluation with PA and lateral view chest x-ray Electronically Signed   By: Morgane  Naveau M.D.   On: 07/29/2024 00:39   CT Cervical Spine Wo Contrast Result Date: 07/29/2024 CLINICAL DATA:  Head trauma, minor (Age >= 65y); Neck trauma (Age >= 65y) EXAM: CT HEAD WITHOUT CONTRAST CT CERVICAL SPINE WITHOUT CONTRAST TECHNIQUE: Multidetector CT imaging of the head and cervical spine was performed following the standard protocol without intravenous contrast. Multiplanar CT image reconstructions of the cervical spine were also generated. RADIATION DOSE  REDUCTION: This exam was performed according to the departmental dose-optimization program which includes  automated exposure control, adjustment of the mA and/or kV according to patient size and/or use of iterative reconstruction technique. COMPARISON:  None Available. FINDINGS: CT HEAD FINDINGS Brain: Cerebral ventricle sizes are concordant with the degree of cerebral volume loss. Patchy and confluent areas of decreased attenuation are noted throughout the deep and periventricular white matter of the cerebral hemispheres bilaterally, compatible with chronic microvascular ischemic disease. No evidence of large-territorial acute infarction. No parenchymal hemorrhage. No mass lesion. No extra-axial collection. No mass effect or midline shift. No hydrocephalus. Basilar cisterns are patent. Vascular: No hyperdense vessel. Atherosclerotic calcifications are present within the cavernous internal carotid and vertebral arteries. Skull: No acute fracture or focal lesion. Sinuses/Orbits: Bilateral sphenoid sinus mucosal thickening. Paranasal sinuses and mastoid air cells are clear. Bilateral lens replacement. Otherwise the orbits are unremarkable. Other: None. CT CERVICAL SPINE FINDINGS Alignment: Grade 1 anterolisthesis of C3 on C4. Mild retrolisthesis of C4 on C5 grade 1 anterolisthesis of C7 on T1. Skull base and vertebrae: Multilevel severe degenerative changes of the spine. No acute fracture. No aggressive appearing focal osseous lesion or focal pathologic process. Soft tissues and spinal canal: No prevertebral fluid or swelling. No visible canal hematoma. Upper chest: Mosaic attenuation of the lungs. Interlobular septal wall thickening. Trace right pleural effusion. Other: Atherosclerotic plaque of the aortic arch and its main branches. IMPRESSION: 1. No acute intracranial abnormality. 2. No acute displaced fracture or traumatic listhesis of the cervical spine. 3. Findings suggestive of pulmonary edema. Trace right  pleural effusion. Recommend further evaluation with PA and lateral view chest x-ray Electronically Signed   By: Morgane  Naveau M.D.   On: 07/29/2024 00:39    Microbiology: Results for orders placed or performed during the hospital encounter of 07/28/24  Resp panel by RT-PCR (RSV, Flu A&B, Covid) Anterior Nasal Swab     Status: None   Collection Time: 07/29/24  3:23 AM   Specimen: Anterior Nasal Swab  Result Value Ref Range Status   SARS Coronavirus 2 by RT PCR NEGATIVE NEGATIVE Final    Comment: (NOTE) SARS-CoV-2 target nucleic acids are NOT DETECTED.  The SARS-CoV-2 RNA is generally detectable in upper respiratory specimens during the acute phase of infection. The lowest concentration of SARS-CoV-2 viral copies this assay can detect is 138 copies/mL. A negative result does not preclude SARS-Cov-2 infection and should not be used as the sole basis for treatment or other patient management decisions. A negative result may occur with  improper specimen collection/handling, submission of specimen other than nasopharyngeal swab, presence of viral mutation(s) within the areas targeted by this assay, and inadequate number of viral copies(<138 copies/mL). A negative result must be combined with clinical observations, patient history, and epidemiological information. The expected result is Negative.  Fact Sheet for Patients:  BloggerCourse.com  Fact Sheet for Healthcare Providers:  SeriousBroker.it  This test is no t yet approved or cleared by the [place]  FDA and  has been authorized for detection and/or diagnosis of SARS-CoV-2 by FDA under an Emergency Use Authorization (EUA). This EUA will remain  in effect (meaning this test can be used) for the duration of the COVID-19 declaration under Section 564(b)(1) of the Act, 21 U.S.C.section 360bbb-3(b)(1), unless the authorization is terminated  or revoked sooner.       Influenza A  by PCR NEGATIVE NEGATIVE Final   Influenza B by PCR NEGATIVE NEGATIVE Final    Comment: (NOTE) The Xpert Xpress SARS-CoV-2/FLU/RSV plus assay is intended as an aid in the diagnosis of influenza  from Nasopharyngeal swab specimens and should not be used as a sole basis for treatment. Nasal washings and aspirates are unacceptable for Xpert Xpress SARS-CoV-2/FLU/RSV testing.  Fact Sheet for Patients: BloggerCourse.com  Fact Sheet for Healthcare Providers: SeriousBroker.it  This test is not yet approved or cleared by the [place]  FDA and has been authorized for detection and/or diagnosis of SARS-CoV-2 by FDA under an Emergency Use Authorization (EUA). This EUA will remain in effect (meaning this test can be used) for the duration of the COVID-19 declaration under Section 564(b)(1) of the Act, 21 U.S.C. section 360bbb-3(b)(1), unless the authorization is terminated or revoked.     Resp Syncytial Virus by PCR NEGATIVE NEGATIVE Final    Comment: (NOTE) Fact Sheet for Patients: BloggerCourse.com  Fact Sheet for Healthcare Providers: SeriousBroker.it  This test is not yet approved or cleared by the [place]  FDA and has been authorized for detection and/or diagnosis of SARS-CoV-2 by FDA under an Emergency Use Authorization (EUA). This EUA will remain in effect (meaning this test can be used) for the duration of the COVID-19 declaration under Section 564(b)(1) of the Act, 21 U.S.C. section 360bbb-3(b)(1), unless the authorization is terminated or revoked.  Performed at [facility], [address] [place], [place] [address]     Labs: CBC: Recent Labs  Lab 07/29/24 0059 07/29/24 0703 07/30/24 0416  WBC 7.3 9.8 7.1  NEUTROABS 4.6  --   --   HGB 11.6* 10.5* 10.7*  HCT 35.8* 33.6* 32.3*  MCV 102.9* 105.3* 98.2  PLT 219 230 250   Basic Metabolic  Panel: Recent Labs  Lab 07/29/24 0059 07/29/24 0703 07/30/24 0416  NA 137 138 139  K 4.5 4.9 3.9  CL 104 106 103  CO2 19* 22 24  GLUCOSE 134* 107* 98  BUN 38* 36* 34*  CREATININE 1.58* 1.39* 1.34*  CALCIUM  8.9 8.2* 9.0   Liver Function Tests: Recent Labs  Lab 07/29/24 0059  AST 22  ALT 16  ALKPHOS 83  BILITOT 0.7  PROT 8.0  ALBUMIN 3.6   CBG: Recent Labs  Lab 07/30/24 0735  GLUCAP 101*    Discharge time spent: greater than 30 minutes.  This record has been created using Conservation officer, historic buildings. Errors have been sought and corrected,but may not always be located. Such creation errors do not reflect on the standard of care.   Signed: Amaryllis Dare, MD [facility] 07/30/2024

## 2024-07-30 NOTE — Progress Notes (Signed)
 CRITICAL VALUE STICKER  CRITICAL VALUE: INR 4.2  RECEIVER (on-site recipient of call): Marolyn Lanie PEAK  DATE & TIME NOTIFIED: 8/9//2025 06:01am  MESSENGER (representative from lab): Cristy  MD NOTIFIED: Erminio Cone NP  TIME OF NOTIFICATION: 06:07 am  RESPONSE: is she having active bleeding?

## 2024-08-01 ENCOUNTER — Telehealth (HOSPITAL_COMMUNITY): Payer: Self-pay | Admitting: Pharmacy Technician

## 2024-08-01 ENCOUNTER — Other Ambulatory Visit (HOSPITAL_COMMUNITY): Payer: Self-pay

## 2024-08-01 NOTE — Telephone Encounter (Signed)
 Patient Product/process development scientist completed.    The patient is insured through [employer] [facility]. Patient has [employer], may use a copay card, and/or apply for patient assistance if available.    Ran test claim for Eliquis  2.5 mg and the current 30 day co-pay is $60.00.   This test claim was processed through [facility]- copay amounts may vary at other pharmacies due to pharmacy/plan contracts, or as the patient moves through the different stages of their insurance plan.     Reyes Sharps, CPHT Pharmacy Technician III Certified Patient Advocate [facility] Patient Advocate Team Direct Number: [phone]  Fax: [phone]

## 2025-01-05 ENCOUNTER — Emergency Department

## 2025-01-05 ENCOUNTER — Emergency Department
Admission: EM | Admit: 2025-01-05 | Discharge: 2025-01-05 | Disposition: A | Discharge status: Skilled Nursing Facility | Attending: Emergency Medicine | Admitting: Emergency Medicine

## 2025-01-05 ENCOUNTER — Other Ambulatory Visit: Payer: Self-pay

## 2025-01-05 DIAGNOSIS — W06XXXA Fall from bed, initial encounter: Secondary | ICD-10-CM | POA: Insufficient documentation

## 2025-01-05 DIAGNOSIS — I4891 Unspecified atrial fibrillation: Secondary | ICD-10-CM | POA: Insufficient documentation

## 2025-01-05 DIAGNOSIS — R413 Other amnesia: Secondary | ICD-10-CM | POA: Diagnosis not present

## 2025-01-05 DIAGNOSIS — Z043 Encounter for examination and observation following other accident: Secondary | ICD-10-CM | POA: Insufficient documentation

## 2025-01-05 DIAGNOSIS — N189 Chronic kidney disease, unspecified: Secondary | ICD-10-CM | POA: Diagnosis not present

## 2025-01-05 DIAGNOSIS — W19XXXA Unspecified fall, initial encounter: Secondary | ICD-10-CM

## 2025-01-05 DIAGNOSIS — I129 Hypertensive chronic kidney disease with stage 1 through stage 4 chronic kidney disease, or unspecified chronic kidney disease: Secondary | ICD-10-CM | POA: Insufficient documentation

## 2025-01-05 DIAGNOSIS — Z7901 Long term (current) use of anticoagulants: Secondary | ICD-10-CM | POA: Insufficient documentation

## 2025-01-05 DIAGNOSIS — Z853 Personal history of malignant neoplasm of breast: Secondary | ICD-10-CM | POA: Insufficient documentation

## 2025-01-05 LAB — CBC WITH DIFFERENTIAL/PLATELET
Abs Immature Granulocytes: 0.01 K/uL (ref 0.00–0.07)
Basophils Absolute: 0 K/uL (ref 0.0–0.1)
Basophils Relative: 1 %
Eosinophils Absolute: 0.3 K/uL (ref 0.0–0.5)
Eosinophils Relative: 4 %
HCT: 35.3 % — ABNORMAL LOW (ref 36.0–46.0)
Hemoglobin: 11.7 g/dL — ABNORMAL LOW (ref 12.0–15.0)
Immature Granulocytes: 0 %
Lymphocytes Relative: 36 %
Lymphs Abs: 2.3 K/uL (ref 0.7–4.0)
MCH: 32.8 pg (ref 26.0–34.0)
MCHC: 33.1 g/dL (ref 30.0–36.0)
MCV: 98.9 fL (ref 80.0–100.0)
Monocytes Absolute: 0.8 K/uL (ref 0.1–1.0)
Monocytes Relative: 13 %
Neutro Abs: 2.9 K/uL (ref 1.7–7.7)
Neutrophils Relative %: 46 %
Platelets: 192 K/uL (ref 150–400)
RBC: 3.57 MIL/uL — ABNORMAL LOW (ref 3.87–5.11)
RDW: 13 % (ref 11.5–15.5)
WBC: 6.3 K/uL (ref 4.0–10.5)
nRBC: 0 % (ref 0.0–0.2)

## 2025-01-05 LAB — BASIC METABOLIC PANEL WITH GFR
Anion gap: 10 (ref 5–15)
BUN: 26 mg/dL — ABNORMAL HIGH (ref 8–23)
CO2: 26 mmol/L (ref 22–32)
Calcium: 9 mg/dL (ref 8.9–10.3)
Chloride: 102 mmol/L (ref 98–111)
Creatinine, Ser: 1.34 mg/dL — ABNORMAL HIGH (ref 0.44–1.00)
GFR, Estimated: 37 mL/min — ABNORMAL LOW
Glucose, Bld: 94 mg/dL (ref 70–99)
Potassium: 4.3 mmol/L (ref 3.5–5.1)
Sodium: 138 mmol/L (ref 135–145)

## 2025-01-05 NOTE — Discharge Instructions (Signed)
 Your imaging and blood work was normal today. Please return to the ED with any worsening symptoms.

## 2025-01-05 NOTE — ED Provider Notes (Signed)
 "  [facility] Provider Note    Event Date/Time   First MD Initiated Contact with Patient 01/05/25 1911     (approximate)   History   Fall   HPI  Peggy [name] is a 89 y.o. female with PMH of hypertension, stroke, A-fib, breast cancer, CKD, memory loss presents for evaluation after a fall.  Patient's daughter at bedside to provide additional history as patient is only ANO x 2 which is her baseline.  Patient's daughter reports that the facility believes that patient slid out of bed and hit her head.  Because she is on Eliquis  it is their protocol to have her evaluated in the ED.  Patient denies any complaints at this time.      Physical Exam   Triage Vital Signs: ED Triage Vitals  Encounter Vitals Group     BP 01/05/25 1620 120/73     Girls Systolic BP Percentile --      Girls Diastolic BP Percentile --      Boys Systolic BP Percentile --      Boys Diastolic BP Percentile --      Pulse Rate 01/05/25 1620 66     Resp 01/05/25 1620 18     Temp 01/05/25 1620 98 F (36.7 C)     Temp Source 01/05/25 1620 Oral     SpO2 01/05/25 1620 97 %     Weight --      Height --      Head Circumference --      Peak Flow --      Pain Score 01/05/25 1621 0     Pain Loc --      Pain Education --      Exclude from Growth Chart --     Most recent vital signs: Vitals:   01/05/25 1620  BP: 120/73  Pulse: 66  Resp: 18  Temp: 98 F (36.7 C)  SpO2: 97%   General: Awake, no distress.  CV:  Good peripheral perfusion.  RRR. Resp:  Normal effort.  CTAB. Abd:  No distention.  Other:  No bumps or contusions noted to the scalp.,  No focal neurodeficits.   ED Results / Procedures / Treatments   Labs (all labs ordered are listed, but only abnormal results are displayed) Labs Reviewed  BASIC METABOLIC PANEL WITH GFR - Abnormal; Notable for the following components:      Result Value   BUN 26 (*)    Creatinine, Ser 1.34 (*)    GFR, Estimated 37 (*)    All  other components within normal limits  CBC WITH DIFFERENTIAL/PLATELET - Abnormal; Notable for the following components:   RBC 3.57 (*)    Hemoglobin 11.7 (*)    HCT 35.3 (*)    All other components within normal limits     EKG  ED provider interpretation: Normal sinus rhythm  Vent. rate 66 BPM  PR interval 180 ms  QRS duration 116 ms  QT/QTcB 422/442 ms  P-R-T axes 82 -69 80   RADIOLOGY  CT head and cervical spine obtained, I interpreted the images as well as reviewed the radiologist report.  Images are negative for acute abnormalities.  PROCEDURES:  Critical Care performed: No  Procedures   MEDICATIONS ORDERED IN ED: Medications - No data to display   IMPRESSION / MDM / ASSESSMENT AND PLAN / ED COURSE  I reviewed the triage vital signs and the nursing notes.  89 year old female presents for evaluation after a fall. VSS and patient NAD on exam.   Differential diagnosis includes, but is not limited to, contusion, intracranial bleed, skull fracture, laceration, screening exam.  Patient's presentation is most consistent with acute complicated illness / injury requiring diagnostic workup.  CBC and BMP are unremarkable and consistent with patient's baseline.   CT head and CT cervical spine are both negative for acute abnormalities.   Patient does not have any contusions, abrasions or lacerations. She has no complaints or pain at this time. Discussed return precautions.   Work up is reassuring, patient stable for discharge.      FINAL CLINICAL IMPRESSION(S) / ED DIAGNOSES   Final diagnoses:  Fall, initial encounter     Rx / DC Orders   ED Discharge Orders     None        Note:  This document was prepared using Dragon voice recognition software and may include unintentional dictation errors.   Cleaster Tinnie LABOR, PA-C 01/05/25 1940    Willo Dunnings, MD 01/05/25 2332  "

## 2025-01-05 NOTE — ED Triage Notes (Signed)
 Pt to ED via [facility] from [facility]. Pt had fall today. Unsure of LOC. Pt A&Ox2. 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 Denies pain.

## 2025-01-05 NOTE — ED Provider Triage Note (Signed)
 Emergency Medicine Provider Triage Evaluation Note  Peggy [name] , a 89 y.o. female  was evaluated in triage.  Pt complains of fall, does take eliquis . History limited. Patient is Aox2 which is her baseline.   Review of Systems  Positive: No complaints at this time Negative: pain  Physical Exam  There were no vitals taken for this visit. Gen:   Awake, no distress, pleasantly confused Resp:  Normal effort  MSK:   Moves extremities without difficulty  Other:    Medical Decision Making  Medically screening exam initiated at 4:17 PM.  Appropriate orders placed.  Peggy [name] was informed that the remainder of the evaluation will be completed by another provider, this initial triage assessment does not replace that evaluation, and the importance of remaining in the ED until their evaluation is complete.     Cleaster Tinnie DELENA, PA-C 01/05/25 1622

## 2025-01-05 NOTE — ED Triage Notes (Signed)
 First Nurse Note:  Pt via [facility] from [facility] Pt had a fall today, pt does take Eliquis . Pt is A&Ox2, which is the pt's baseline.  EMS reports:  64 HR  98% on RA  120/68 BP

## 2025-02-19 DEATH — deceased
